# Patient Record
Sex: Female | Born: 1935 | Race: White | Hispanic: No | Smoking: Never smoker
Health system: Southern US, Community
[De-identification: ages and names within clinical notes are randomized; demographics above are authoritative.]

## PROBLEM LIST (undated history)

## (undated) DIAGNOSIS — M5441 Lumbago with sciatica, right side: Secondary | ICD-10-CM

## (undated) DIAGNOSIS — F329 Major depressive disorder, single episode, unspecified: Secondary | ICD-10-CM

## (undated) DIAGNOSIS — K219 Gastro-esophageal reflux disease without esophagitis: Secondary | ICD-10-CM

## (undated) DIAGNOSIS — N183 Chronic kidney disease, stage 3 unspecified: Secondary | ICD-10-CM

## (undated) DIAGNOSIS — G43909 Migraine, unspecified, not intractable, without status migrainosus: Secondary | ICD-10-CM

## (undated) DIAGNOSIS — M1990 Unspecified osteoarthritis, unspecified site: Secondary | ICD-10-CM

## (undated) DIAGNOSIS — I10 Essential (primary) hypertension: Secondary | ICD-10-CM

## (undated) DIAGNOSIS — F32A Depression, unspecified: Secondary | ICD-10-CM

## (undated) DIAGNOSIS — G4700 Insomnia, unspecified: Secondary | ICD-10-CM

## (undated) DIAGNOSIS — J690 Pneumonitis due to inhalation of food and vomit: Secondary | ICD-10-CM

## (undated) DIAGNOSIS — R1314 Dysphagia, pharyngoesophageal phase: Secondary | ICD-10-CM

## (undated) DIAGNOSIS — R1310 Dysphagia, unspecified: Secondary | ICD-10-CM

## (undated) DIAGNOSIS — N814 Uterovaginal prolapse, unspecified: Secondary | ICD-10-CM

## (undated) DIAGNOSIS — F419 Anxiety disorder, unspecified: Secondary | ICD-10-CM

## (undated) HISTORY — DX: Essential (primary) hypertension: I10

## (undated) HISTORY — DX: Anxiety disorder, unspecified: F41.9

## (undated) HISTORY — DX: Unspecified osteoarthritis, unspecified site: M19.90

## (undated) HISTORY — DX: Major depressive disorder, single episode, unspecified: F32.9

## (undated) HISTORY — DX: Uterovaginal prolapse, unspecified: N81.4

## (undated) HISTORY — DX: Depression, unspecified: F32.A

## (undated) HISTORY — DX: Gastro-esophageal reflux disease without esophagitis: K21.9

## (undated) HISTORY — DX: Insomnia, unspecified: G47.00

## (undated) HISTORY — PX: CATARACT EXTRACTION W/ INTRAOCULAR LENS IMPLANT: SHX1309

## (undated) HISTORY — DX: Lumbago with sciatica, right side: M54.41

## (undated) HISTORY — DX: Chronic kidney disease, stage 3 unspecified: N18.30

---
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
## 2009-09-28 DIAGNOSIS — L570 Actinic keratosis: Secondary | ICD-10-CM | POA: Insufficient documentation

## 2010-08-06 DIAGNOSIS — Z0000 Encounter for general adult medical examination without abnormal findings: Secondary | ICD-10-CM | POA: Insufficient documentation

## 2011-03-26 DIAGNOSIS — M79609 Pain in unspecified limb: Secondary | ICD-10-CM | POA: Diagnosis not present

## 2011-03-26 DIAGNOSIS — M542 Cervicalgia: Secondary | ICD-10-CM | POA: Diagnosis not present

## 2011-04-28 ENCOUNTER — Encounter: Payer: Self-pay | Admitting: Internal Medicine

## 2011-04-28 ENCOUNTER — Ambulatory Visit (INDEPENDENT_AMBULATORY_CARE_PROVIDER_SITE_OTHER): Payer: Medicare Other | Admitting: Internal Medicine

## 2011-04-28 VITALS — BP 132/90 | HR 92 | Temp 97.8°F | Resp 18 | Ht 60.75 in | Wt 120.0 lb

## 2011-04-28 DIAGNOSIS — F4000 Agoraphobia, unspecified: Secondary | ICD-10-CM

## 2011-04-28 DIAGNOSIS — I10 Essential (primary) hypertension: Secondary | ICD-10-CM | POA: Diagnosis not present

## 2011-04-28 DIAGNOSIS — M1990 Unspecified osteoarthritis, unspecified site: Secondary | ICD-10-CM | POA: Diagnosis not present

## 2011-04-28 DIAGNOSIS — Z0000 Encounter for general adult medical examination without abnormal findings: Secondary | ICD-10-CM | POA: Diagnosis not present

## 2011-04-28 MED ORDER — ATENOLOL 50 MG PO TABS: 75.0000 mg | ORAL_TABLET | Freq: Every day | ORAL | Status: DC

## 2011-04-28 MED ORDER — DIAZEPAM 5 MG PO TABS: 5.0000 mg | ORAL_TABLET | Freq: Three times a day (TID) | ORAL | Status: DC | PRN

## 2011-04-28 MED ORDER — AMITRIPTYLINE HCL 50 MG PO TABS: 50.0000 mg | ORAL_TABLET | Freq: Every day | ORAL | Status: DC

## 2011-04-28 NOTE — Progress Notes (Signed)
Subjective:    Patient ID: Savannah Diaz, female    DOB: 02-09-1933, 78 y.o.   MRN: [id number]  HPI  78 year old patient who is seen today to establish with our practice. She has a long history of both hypertension and what she describes as agoraphobia. She has been on blood pressure medications as well as amitriptyline for 30 years.    Is doing quite well today.  Gravida 2 para 2 abortus 0 Hospitalized briefly 30 years ago due to acute low back pain No other hospitalizations or surgeries  Social history she is a widowed about a year and a half lifelong nonsmoker daughter in the area resides in [place] 6 months of the year  Family history father died age 66 complications of colon cancer mother died at age 74 One brother in good health One sister died at age 79  1. Risk factors, based on past  M,S,F history  cardiovascular risk factors include a history of hypertension only  2.  Physical activities: No activity restrictions  3.  Depression/mood: No history of depression or mood disorder  4.  Hearing: No deficits  5.  ADL's: Independent in all aspects of daily living  6.  Fall risk: Low 7.  Home safety: No problems identified  8.  Height weight, and visual acuity; height and weight stable no change in visual acuity  9.  Counseling: Heart healthy diet more regular exercise encouraged  10. Lab orders based on risk factors: Not appropriate at this time  11. Referral : Not appropriate at this time  12. Care plan: We'll obtain annual health examination in [place];  will be available here for problems arise and also will see annually. She was asked to have all records faxed  13. Cognitive assessment: Alert and oriented with normal affect no cognitive dysfunction        Review of Systems  Constitutional: Negative for fever, appetite change, fatigue and unexpected weight change.  HENT: Negative for hearing loss, ear pain, nosebleeds, congestion, sore throat, mouth  sores, trouble swallowing, neck stiffness, dental problem, voice change, sinus pressure and tinnitus.   Eyes: Negative for photophobia, pain, redness and visual disturbance.  Respiratory: Negative for cough, chest tightness and shortness of breath.   Cardiovascular: Negative for chest pain, palpitations and leg swelling.  Gastrointestinal: Negative for nausea, vomiting, abdominal pain, diarrhea, constipation, blood in stool, abdominal distention and rectal pain.  Genitourinary: Negative for dysuria, urgency, frequency, hematuria, flank pain, vaginal bleeding, vaginal discharge, difficulty urinating, genital sores, vaginal pain, menstrual problem and pelvic pain.  Musculoskeletal: Negative for back pain and arthralgias.  Skin: Negative for rash.  Neurological: Negative for dizziness, syncope, speech difficulty, weakness, light-headedness, numbness and headaches.  Hematological: Negative for adenopathy. Does not bruise/bleed easily.  Psychiatric/Behavioral: Negative for suicidal ideas, behavioral problems, self-injury, dysphoric mood and agitation. The patient is not nervous/anxious.        Objective:   Physical Exam  Constitutional: She is oriented to person, place, and time. She appears well-developed and well-nourished.  HENT:  Head: Normocephalic and atraumatic.  Right Ear: External ear normal.  Left Ear: External ear normal.  Mouth/Throat: Oropharynx is clear and moist.  Eyes: Conjunctivae and EOM are normal.  Neck: Normal range of motion. Neck supple. No JVD present. No thyromegaly present.  Cardiovascular: Normal rate, regular rhythm, normal heart sounds and intact distal pulses.   No murmur heard. Pulmonary/Chest: Effort normal and breath sounds normal. She has no wheezes. She has no rales.  Abdominal:  Soft. Bowel sounds are normal. She exhibits no distension and no mass. There is no tenderness. There is no rebound and no guarding.  Musculoskeletal: Normal range of motion. She  exhibits no edema and no tenderness.       Trigger finger left third digit  Neurological: She is alert and oriented to person, place, and time. She has normal reflexes. No cranial nerve deficit. She exhibits normal muscle tone. Coordination normal.  Skin: Skin is warm and dry. No rash noted.  Psychiatric: She has a normal mood and affect. Her behavior is normal.          Assessment & Plan:  Hypertension well controlled Preventive health examination History of agoraphobia. Will decrease Elavil to 150 mg at bedtime. Hopefully can further titrate down We'll continue present blood pressure regimen  Return here in one year or as needed Followup in [place] also annually

## 2011-04-28 NOTE — Patient Instructions (Signed)
Limit your sodium (Salt) intake    It is important that you exercise regularly, at least 20 minutes 3 to 4 times per week.  If you develop chest pain or shortness of breath seek  medical attention.  Take a calcium supplement, plus 800-1200 units of vitamin D  Return in one year for follow-up  

## 2011-05-05 ENCOUNTER — Telehealth: Payer: Self-pay | Admitting: Family Medicine

## 2011-05-05 DIAGNOSIS — M6530 Trigger finger, unspecified finger: Secondary | ICD-10-CM

## 2011-05-05 NOTE — Telephone Encounter (Signed)
Please advise 

## 2011-05-05 NOTE — Telephone Encounter (Signed)
Refer Dr Sypher 

## 2011-05-05 NOTE — Telephone Encounter (Signed)
Hand surgeon - order done

## 2011-05-05 NOTE — Telephone Encounter (Signed)
Pt saw you recently as a new pt. Has a trigger finger, which at the time of the appt she did not wish to mess with. However, it is hurting more and she would like a referral to someone who can fix it. Thanks!

## 2011-05-05 NOTE — Telephone Encounter (Signed)
Refer Dr Teressa Senter

## 2011-05-13 ENCOUNTER — Encounter: Payer: Self-pay | Admitting: Internal Medicine

## 2011-05-13 ENCOUNTER — Ambulatory Visit (INDEPENDENT_AMBULATORY_CARE_PROVIDER_SITE_OTHER): Payer: Medicare Other | Admitting: Internal Medicine

## 2011-05-13 ENCOUNTER — Ambulatory Visit: Payer: Medicare Other | Admitting: Internal Medicine

## 2011-05-13 VITALS — BP 122/80 | Temp 98.7°F | Wt 116.0 lb

## 2011-05-13 DIAGNOSIS — I10 Essential (primary) hypertension: Secondary | ICD-10-CM | POA: Diagnosis not present

## 2011-05-13 DIAGNOSIS — S90129A Contusion of unspecified lesser toe(s) without damage to nail, initial encounter: Secondary | ICD-10-CM | POA: Diagnosis not present

## 2011-05-13 NOTE — Patient Instructions (Signed)
Continue application of ice until bedtime tonight Continue ibuprofen as needed  Elevation  Call if worsening pain redness or drainage

## 2011-05-13 NOTE — Progress Notes (Signed)
  Subjective:    Patient ID: Savannah Diaz, female    DOB: December 06, 1932, 78 y.o.   MRN: [id number]  HPI  78 year old patient who traumatized her left great toe earlier today there has been some minimal pain and bleeding. She has a history of hypertension which has been well-controlled. She has been using ibuprofen and applying ice    Review of Systems  Skin: Positive for wound.       Objective:   Physical Exam  Skin:       The left great toe had soft tissue swelling and ecchymoses about the nailbed. The area was cleaned antibiotic ointment applied and the wound dressed          Assessment & Plan:   Toenail contusion. Local wound care discussed she'll continue eyes for the next 24 hours then continue ibuprofen and elevation. We'll call if unimproved

## 2011-06-04 DIAGNOSIS — M6530 Trigger finger, unspecified finger: Secondary | ICD-10-CM | POA: Diagnosis not present

## 2011-07-22 DIAGNOSIS — M129 Arthropathy, unspecified: Secondary | ICD-10-CM | POA: Diagnosis not present

## 2011-07-22 DIAGNOSIS — F329 Major depressive disorder, single episode, unspecified: Secondary | ICD-10-CM | POA: Diagnosis not present

## 2011-07-22 DIAGNOSIS — I10 Essential (primary) hypertension: Secondary | ICD-10-CM | POA: Diagnosis not present

## 2011-07-28 DIAGNOSIS — M25561 Pain in right knee: Secondary | ICD-10-CM | POA: Insufficient documentation

## 2011-07-28 DIAGNOSIS — M25569 Pain in unspecified knee: Secondary | ICD-10-CM | POA: Diagnosis not present

## 2011-08-04 DIAGNOSIS — M25569 Pain in unspecified knee: Secondary | ICD-10-CM | POA: Diagnosis not present

## 2011-08-11 DIAGNOSIS — M25569 Pain in unspecified knee: Secondary | ICD-10-CM | POA: Diagnosis not present

## 2011-08-27 DIAGNOSIS — Z961 Presence of intraocular lens: Secondary | ICD-10-CM | POA: Diagnosis not present

## 2011-08-27 DIAGNOSIS — H269 Unspecified cataract: Secondary | ICD-10-CM | POA: Diagnosis not present

## 2011-08-27 DIAGNOSIS — F341 Dysthymic disorder: Secondary | ICD-10-CM | POA: Diagnosis not present

## 2011-09-10 DIAGNOSIS — N390 Urinary tract infection, site not specified: Secondary | ICD-10-CM | POA: Diagnosis not present

## 2011-09-10 DIAGNOSIS — R350 Frequency of micturition: Secondary | ICD-10-CM | POA: Diagnosis not present

## 2011-09-22 DIAGNOSIS — I10 Essential (primary) hypertension: Secondary | ICD-10-CM | POA: Diagnosis not present

## 2011-09-22 DIAGNOSIS — Z0000 Encounter for general adult medical examination without abnormal findings: Secondary | ICD-10-CM | POA: Diagnosis not present

## 2011-09-22 DIAGNOSIS — M810 Age-related osteoporosis without current pathological fracture: Secondary | ICD-10-CM | POA: Diagnosis not present

## 2011-09-24 DIAGNOSIS — F341 Dysthymic disorder: Secondary | ICD-10-CM | POA: Diagnosis not present

## 2011-10-21 DIAGNOSIS — L570 Actinic keratosis: Secondary | ICD-10-CM | POA: Diagnosis not present

## 2011-11-07 DIAGNOSIS — I998 Other disorder of circulatory system: Secondary | ICD-10-CM | POA: Diagnosis not present

## 2011-11-07 DIAGNOSIS — L988 Other specified disorders of the skin and subcutaneous tissue: Secondary | ICD-10-CM | POA: Diagnosis not present

## 2011-11-07 DIAGNOSIS — L570 Actinic keratosis: Secondary | ICD-10-CM | POA: Diagnosis not present

## 2011-11-07 DIAGNOSIS — K130 Diseases of lips: Secondary | ICD-10-CM | POA: Diagnosis not present

## 2011-12-01 DIAGNOSIS — L570 Actinic keratosis: Secondary | ICD-10-CM | POA: Diagnosis not present

## 2011-12-10 DIAGNOSIS — R300 Dysuria: Secondary | ICD-10-CM | POA: Diagnosis not present

## 2011-12-10 DIAGNOSIS — F341 Dysthymic disorder: Secondary | ICD-10-CM | POA: Diagnosis not present

## 2011-12-16 DIAGNOSIS — L570 Actinic keratosis: Secondary | ICD-10-CM | POA: Diagnosis not present

## 2012-04-27 ENCOUNTER — Ambulatory Visit (INDEPENDENT_AMBULATORY_CARE_PROVIDER_SITE_OTHER): Payer: Medicare Other | Admitting: Internal Medicine

## 2012-04-27 ENCOUNTER — Encounter: Payer: Self-pay | Admitting: Internal Medicine

## 2012-04-27 VITALS — BP 156/90 | HR 84 | Temp 98.3°F | Resp 18 | Wt 129.0 lb

## 2012-04-27 DIAGNOSIS — I10 Essential (primary) hypertension: Secondary | ICD-10-CM

## 2012-04-27 DIAGNOSIS — M1990 Unspecified osteoarthritis, unspecified site: Secondary | ICD-10-CM | POA: Diagnosis not present

## 2012-04-27 NOTE — Patient Instructions (Signed)
Limit your sodium (Salt) intake    It is important that you exercise regularly, at least 20 minutes 3 to 4 times per week.  If you develop chest pain or shortness of breath seek  medical attention.  Please check your blood pressure on a regular basis.  If it is consistently greater than 150/90, please make an office appointment.  

## 2012-04-27 NOTE — Progress Notes (Signed)
Subjective:    Patient ID: Savannah Diaz, female    DOB: 1933/01/25, 79 y.o.   MRN: [id number]  HPI  79 year old patient who is seen today for followup. She has a history of treated hypertension. She is followed in [place] both by a PCP and psychiatrist. She is doing quite well today. She remains on amitriptyline presently a dose of 225 mg. She is doing recently well and is planning on returning to [place] later this month for her annual exam there as well as psychiatric followup  Past Medical History  Diagnosis Date  . Hypertension   . Anxiety   . Arthritis   . Cataract     left  . Depression   . GERD (gastroesophageal reflux disease)   . Osteoporosis     History   Social History  . Marital Status: Widowed    Spouse Name: N/A    Number of Children: N/A  . Years of Education: N/A   Occupational History  . Not on file.   Social History Main Topics  . Smoking status: Never Smoker   . Smokeless tobacco: Never Used  . Alcohol Use: No  . Drug Use: No  . Sexually Active: Not on file   Other Topics Concern  . Not on file   Social History Narrative  . No narrative on file    Past Surgical History  Procedure Laterality Date  . Cataract extraction      left    Family History  Problem Relation Age of Onset  . Arthritis Mother   . Hypertension Mother   . Dementia Mother   . Cancer Father     colon     No Known Allergies  Current Outpatient Prescriptions on File Prior to Visit  Medication Sig Dispense Refill  . amitriptyline (ELAVIL) 50 MG tablet Take 225 mg by mouth at bedtime.       Marland Kitchen atenolol (TENORMIN) 50 MG tablet Take 1.5 tablets (75 mg total) by mouth daily.  180 tablet  4  . diazepam (VALIUM) 5 MG tablet Take 1 tablet (5 mg total) by mouth every 8 (eight) hours as needed.  60 tablet  4  . famotidine (PEPCID) 20 MG tablet Take 40 mg by mouth 2 (two) times daily.      Marland Kitchen lisinopril (PRINIVIL,ZESTRIL) 40 MG tablet Take 40 mg by mouth daily.        No current facility-administered medications on file prior to visit.    BP 156/90  Pulse 84  Temp(Src) 98.3 F (36.8 C) (Oral)  Resp 18  Wt 129 lb (58.514 kg)  BMI 24.58 kg/m2  SpO2 96%       Review of Systems  Constitutional: Negative.   HENT: Negative for hearing loss, congestion, sore throat, rhinorrhea, dental problem, sinus pressure and tinnitus.   Eyes: Negative for pain, discharge and visual disturbance.  Respiratory: Negative for cough and shortness of breath.   Cardiovascular: Negative for chest pain, palpitations and leg swelling.  Gastrointestinal: Negative for nausea, vomiting, abdominal pain, diarrhea, constipation, blood in stool and abdominal distention.  Genitourinary: Negative for dysuria, urgency, frequency, hematuria, flank pain, vaginal bleeding, vaginal discharge, difficulty urinating, vaginal pain and pelvic pain.  Musculoskeletal: Negative for joint swelling, arthralgias and gait problem.  Skin: Negative for rash.  Neurological: Negative for dizziness, syncope, speech difficulty, weakness, numbness and headaches.  Hematological: Negative for adenopathy.  Psychiatric/Behavioral: Positive for dysphoric mood. Negative for behavioral problems and agitation. The patient is nervous/anxious.  Objective:   Physical Exam  Constitutional: She is oriented to person, place, and time. She appears well-developed and well-nourished.  HENT:  Head: Normocephalic.  Right Ear: External ear normal.  Left Ear: External ear normal.  Mouth/Throat: Oropharynx is clear and moist.  Eyes: Conjunctivae and EOM are normal. Pupils are equal, round, and reactive to light.  Neck: Normal range of motion. Neck supple. No thyromegaly present.  Cardiovascular: Normal rate, regular rhythm, normal heart sounds and intact distal pulses.   Pulmonary/Chest: Effort normal and breath sounds normal.  Abdominal: Soft. Bowel sounds are normal. She exhibits no mass. There is no  tenderness.  Musculoskeletal: Normal range of motion.  Lymphadenopathy:    She has no cervical adenopathy.  Neurological: She is alert and oriented to person, place, and time.  Skin: Skin is warm and dry. No rash noted.  Psychiatric: She has a normal mood and affect. Her behavior is normal.          Assessment & Plan:   Adjustment reaction with anxiety depression, death of her husband. Followup psychiatry Hypertension fairly stable repeat blood pressure 140/84. CPX with [facility] PCP as scheduled  Return here in one year or as needed

## 2012-05-10 ENCOUNTER — Encounter (HOSPITAL_COMMUNITY)
Admission: RE | Disposition: A | Discharge status: Home / Self Care | Payer: Self-pay | Source: Ambulatory Visit | Attending: Internal Medicine

## 2012-05-10 ENCOUNTER — Encounter: Payer: Self-pay | Admitting: Physician Assistant

## 2012-05-10 ENCOUNTER — Encounter: Payer: Self-pay | Admitting: Family Medicine

## 2012-05-10 ENCOUNTER — Other Ambulatory Visit: Payer: Self-pay | Admitting: *Deleted

## 2012-05-10 ENCOUNTER — Encounter (HOSPITAL_COMMUNITY): Payer: Self-pay | Admitting: *Deleted

## 2012-05-10 ENCOUNTER — Ambulatory Visit (HOSPITAL_COMMUNITY)
Admission: RE | Admit: 2012-05-10 | Discharge: 2012-05-10 | Disposition: A | Discharge status: Home / Self Care | Payer: Medicare Other | Source: Ambulatory Visit | Attending: Internal Medicine | Admitting: Internal Medicine

## 2012-05-10 ENCOUNTER — Ambulatory Visit (INDEPENDENT_AMBULATORY_CARE_PROVIDER_SITE_OTHER): Payer: Medicare Other | Admitting: Family Medicine

## 2012-05-10 ENCOUNTER — Ambulatory Visit (INDEPENDENT_AMBULATORY_CARE_PROVIDER_SITE_OTHER): Payer: Medicare Other | Admitting: Physician Assistant

## 2012-05-10 VITALS — HR 70 | Temp 99.7°F | Wt 124.0 lb

## 2012-05-10 VITALS — BP 150/90 | HR 118 | Ht 66.0 in | Wt 123.0 lb

## 2012-05-10 DIAGNOSIS — R05 Cough: Secondary | ICD-10-CM

## 2012-05-10 DIAGNOSIS — I10 Essential (primary) hypertension: Secondary | ICD-10-CM | POA: Diagnosis not present

## 2012-05-10 DIAGNOSIS — R6889 Other general symptoms and signs: Secondary | ICD-10-CM | POA: Insufficient documentation

## 2012-05-10 DIAGNOSIS — F4000 Agoraphobia, unspecified: Secondary | ICD-10-CM | POA: Diagnosis not present

## 2012-05-10 DIAGNOSIS — Z79899 Other long term (current) drug therapy: Secondary | ICD-10-CM | POA: Diagnosis not present

## 2012-05-10 DIAGNOSIS — R1310 Dysphagia, unspecified: Secondary | ICD-10-CM

## 2012-05-10 DIAGNOSIS — K449 Diaphragmatic hernia without obstruction or gangrene: Secondary | ICD-10-CM | POA: Insufficient documentation

## 2012-05-10 DIAGNOSIS — R1314 Dysphagia, pharyngoesophageal phase: Secondary | ICD-10-CM

## 2012-05-10 DIAGNOSIS — Z01818 Encounter for other preprocedural examination: Secondary | ICD-10-CM

## 2012-05-10 DIAGNOSIS — R059 Cough, unspecified: Secondary | ICD-10-CM | POA: Insufficient documentation

## 2012-05-10 HISTORY — PX: ESOPHAGOGASTRODUODENOSCOPY: SHX5428

## 2012-05-10 HISTORY — DX: Dysphagia, pharyngoesophageal phase: R13.14

## 2012-05-10 SURGERY — EGD (ESOPHAGOGASTRODUODENOSCOPY)
Anesthesia: Moderate Sedation

## 2012-05-10 MED ORDER — MIDAZOLAM HCL 10 MG/2ML IJ SOLN
INTRAMUSCULAR | Status: DC | PRN
  Administered 2012-05-10 (×2): 2 mg via INTRAVENOUS
  Administered 2012-05-10 (×2): 1 mg via INTRAVENOUS
  Administered 2012-05-10: 2 mg via INTRAVENOUS

## 2012-05-10 MED ORDER — DIPHENHYDRAMINE HCL 50 MG/ML IJ SOLN
INTRAMUSCULAR | Status: AC
  Filled 2012-05-10: qty 1

## 2012-05-10 MED ORDER — MIDAZOLAM HCL 10 MG/2ML IJ SOLN
INTRAMUSCULAR | Status: AC
  Filled 2012-05-10: qty 2

## 2012-05-10 MED ORDER — FENTANYL CITRATE 0.05 MG/ML IJ SOLN
INTRAMUSCULAR | Status: DC | PRN
  Administered 2012-05-10 (×4): 25 ug via INTRAVENOUS

## 2012-05-10 MED ORDER — FENTANYL CITRATE 0.05 MG/ML IJ SOLN
INTRAMUSCULAR | Status: AC
  Filled 2012-05-10: qty 2

## 2012-05-10 MED ORDER — SODIUM CHLORIDE 0.9 % IV SOLN
INTRAVENOUS | Status: DC
  Administered 2012-05-10: 500 mL via INTRAVENOUS

## 2012-05-10 NOTE — Interval H&P Note (Signed)
History and Physical Interval Note: As per note from clinic today. The nature of the procedure, as well as the risks, benefits, and alternatives were carefully and thoroughly reviewed with the patient. Ample time for discussion and questions allowed. The patient understood, was satisfied, and agreed to proceed.      05/10/2012 3:05 PM  Savannah Diaz  has presented today for surgery, with the diagnosis of pill impaction  The various methods of treatment have been discussed with the patient and family. After consideration of risks, benefits and other options for treatment, the patient has consented to  Procedure(s): ESOPHAGOGASTRODUODENOSCOPY (EGD) (N/A) as a surgical intervention .  The patient's history has been reviewed, patient examined, no change in status, stable for surgery.  I have reviewed the patient's chart and labs.  Questions were answered to the patient's satisfaction.     Navie Lamoreaux M

## 2012-05-10 NOTE — Patient Instructions (Addendum)
You have been scheduled for an endoscopy with propofol. Please follow written instructions given to you at your visit today. If you use inhalers (even only as needed), please bring them with you on the day of your procedure.  Go to [facility] End[facility] at 1 PM.

## 2012-05-10 NOTE — H&P (View-Only) (Signed)
Subjective:    Patient ID: Savannah Diaz, female    DOB: 03/26/1932, 79 y.o.   MRN: [id number]  HPI Savannah Diaz is a 79-year-old white female new to GI today referred by Dr. Hanna Kim after being seen earlier this morning at her office. Patient has developed an acute episode of dysphagia which had onset last evening after she tried to swallow her medications. She says she did her without any difficulty but after she cut up her pills to swallow them which she normally does she feels as that lead as if at least one of the pills got stuck. She says she started having coughing and then started producing a lot of phlegm and has been unable to swallow since. She says she was up all night coughing and uncomfortable but has never brought back up the pills ,and. has not vomited. She has been some spitting but says at this time she's able to swallow her saliva . Her daughter says she was barely able to speak this morning but over the past couple of hours her voice quality has improved. While being evaluated in the office she does appear to be swallowing her saliva and her voice sounds very wet and she's intermittently coughing. He says she has had some difficulty swallowing over the past 7 years but has not had any evaluation. She says she chops her food up into very small pieces and eats very slowly and has not had any episodes of regurgitation. She feels that her throat is "tight" , doesn't usually get a sensation of food sitting in her chest. She denies any regular heartburn or indigestion.    Review of Systems  Constitutional: Negative.   HENT: Positive for trouble swallowing and voice change.   Eyes: Negative.   Respiratory: Positive for cough and choking.   Cardiovascular: Negative.   Gastrointestinal: Negative.   Endocrine: Negative.   Genitourinary: Negative.   Musculoskeletal: Negative.   Skin: Negative.   Allergic/Immunologic: Negative.   Neurological: Negative.   Hematological: Negative.    Psychiatric/Behavioral: Negative.    Outpatient Prescriptions Prior to Visit  Medication Sig Dispense Refill  . amitriptyline (ELAVIL) 50 MG tablet Take 225 mg by mouth at bedtime.       . atenolol (TENORMIN) 50 MG tablet Take 1.5 tablets (75 mg total) by mouth daily.  180 tablet  4  . diazepam (VALIUM) 5 MG tablet Take 1 tablet (5 mg total) by mouth every 8 (eight) hours as needed.  60 tablet  4  . famotidine (PEPCID) 20 MG tablet Take 40 mg by mouth 2 (two) times daily.      . lisinopril (PRINIVIL,ZESTRIL) 40 MG tablet Take 40 mg by mouth daily.       No facility-administered medications prior to visit.   No Known Allergies Patient Active Problem List  Diagnosis  . Hypertension  . Osteoarthritis  . Agoraphobia   History  Substance Use Topics  . Smoking status: Never Smoker   . Smokeless tobacco: Never Used  . Alcohol Use: No   family history includes Arthritis in her mother; Cancer in her father; Dementia in her mother; and Hypertension in her mother.     Objective:   Physical Exam  Well-developed elderly white female in any by her daughter. Patient is coughing and voice quality is very wet.. Blood pressure 150/90 pulse 110 height 5 foot 6 weight 123 O2 sat 98. HEENT nontraumatic normocephalic EOMI PERRLA sclera anicteric,Neck; Supple no JVD, Cardiovascular; regular rate and rhythm with S1-S2   no murmur or rub or gallop, Pulmonary; clear she has a few upper airway rhonchi, Abdomen; soft nontender nondistended bowel sounds are active no palpable mass or hepatosplenomegaly extremities no clubbing cyanosis or edema, skin warm and dry. Psych; mood and affect normal and appropriate       Assessment & Plan:  #1 79-year-old female with a several year history of solid food dysphagia now presenting with an episode of acute dysphagia with probable pill impaction. She is able to swallow her saliva but with sipping water feels as if she is going to choke and cough. I suspect she may still  have a pill fragment stuck. Will need to rule out underlying esophageal stricture, Zenker's diverticulum, or motility disorder. #2 HTN #3 Agoraphobia  Plan; patient will remain n.p.o. and have scheduled for upper endoscopy with Dr. Pyrtle this afternoon at [place] period Procedure was discussed in detail with patient and her daughter and they are agreeable to proceed. They asked whether her esophagus will be dilated if a stricture is found and I indicated that she may need a second procedure for dilation as generally the esophagus is too inflamed after an episode like she has just had to make dilation safe on the same day Patient's daughter was somewhat reluctant for her to undergo procedure today as she felt that her voice was sounding a bit better. I advised they proceed with the endoscopy.  Addendum: Reviewed and agree with initial management. Jay M Pyrtle, MD   

## 2012-05-10 NOTE — Progress Notes (Signed)
Chief Complaint  Patient presents with  . Cough    congestion, mucus, sensitive throat    HPI:  Acute visit for cough: -started: yesterday -symptoms: has had trouble swallowing pills and solids - has for a long time (7 years), has not seen anyone for this - since last night when swallowing pills has had odd sounding voice, feels like something is stuck in throat, foamy mucus and dysphagia to liquids and solids - chokes on anything she tries to swallow -denies:fever, SOB, NVD, tooth pain, strep or mono exposure - can't swallow since yesterday - makes her cough -sick contacts: none -Hx of: swallowing difficulty -denies: denies: fevers, SOB, vomiting, reflux, abd pain, pain in throat -hx of GERD on PMH   ROS: See pertinent positives and negatives per HPI.  Past Medical History  Diagnosis Date  . Hypertension   . Anxiety   . Arthritis   . Cataract     left  . Depression   . GERD (gastroesophageal reflux disease)   . Osteoporosis     Family History  Problem Relation Age of Onset  . Arthritis Mother   . Hypertension Mother   . Dementia Mother   . Cancer Father     colon     History   Social History  . Marital Status: Widowed    Spouse Name: N/A    Number of Children: N/A  . Years of Education: N/A   Social History Main Topics  . Smoking status: Never Smoker   . Smokeless tobacco: Never Used  . Alcohol Use: No  . Drug Use: No  . Sexually Active: None   Other Topics Concern  . None   Social History Narrative  . None    Current outpatient prescriptions:amitriptyline (ELAVIL) 50 MG tablet, Take 225 mg by mouth at bedtime. , Disp: , Rfl: ;  atenolol (TENORMIN) 50 MG tablet, Take 1.5 tablets (75 mg total) by mouth daily., Disp: 180 tablet, Rfl: 4;  diazepam (VALIUM) 5 MG tablet, Take 1 tablet (5 mg total) by mouth every 8 (eight) hours as needed., Disp: 60 tablet, Rfl: 4;  famotidine (PEPCID) 20 MG tablet, Take 40 mg by mouth 2 (two) times daily., Disp: , Rfl:   lisinopril (PRINIVIL,ZESTRIL) 40 MG tablet, Take 40 mg by mouth daily., Disp: , Rfl:   EXAM:  Filed Vitals:   05/10/12 0913  Pulse: 70  Temp: 99.7 F (37.6 C)    Body mass index is 23.62 kg/(m^2).  GENERAL: vitals reviewed and listed above, alert, oriented, appears well hydrated and in no acute distress  HEENT: atraumatic, conjunttiva clear, no obvious abnormalities on inspection of external nose and ears  NECK: no obvious masses on inspection  LUNGS: clear to auscultation bilaterally, no wheezes, rales or rhonchi, good air movement - upper airway rhonchi  CV: HRRR, no peripheral edema  MS: moves all extremities without noticeable abnormality  PSYCH: pleasant and cooperative, no obvious depression or anxiety  ASSESSMENT AND PLAN:  Discussed the following assessment and plan:  Dysphagia, unspecified - Plan: Ambulatory referral to Gastroenterology  -arranged for her to see GI this morning for eval and treat - emergency precautions discussed -Patient advised to return or notify a doctor immediately if symptoms worsen or persist or new concerns arise.  There are no Patient Instructions on file for this visit.   Kriste Basque R.

## 2012-05-10 NOTE — Patient Instructions (Signed)
Go to new patient appointment with GI today per details

## 2012-05-10 NOTE — Progress Notes (Addendum)
Subjective:    Patient ID: Savannah Diaz, female    DOB: 1932-09-06, 79 y.o.   MRN: [id number]  HPI Savannah Diaz is a 79 year old white female new to GI today referred by Dr. Herold Diaz after being seen earlier this morning at her office. Patient has developed an acute episode of dysphagia which had onset last evening after she tried to swallow her medications. She says she did her without any difficulty but after she cut up her pills to swallow them which she normally does she feels as that lead as if at least one of the pills got stuck. She says she started having coughing and then started producing a lot of phlegm and has been unable to swallow since. She says she was up all night coughing and uncomfortable but has never brought back up the pills ,and. has not vomited. She has been some spitting but says at this time she's able to swallow her saliva . Her daughter says she was barely able to speak this morning but over the past couple of hours her voice quality has improved. While being evaluated in the office she does appear to be swallowing her saliva and her voice sounds very wet and she's intermittently coughing. He says she has had some difficulty swallowing over the past 7 years but has not had any evaluation. She says she chops her food up into very small pieces and eats very slowly and has not had any episodes of regurgitation. She feels that her throat is "tight" , doesn't usually get a sensation of food sitting in her chest. She denies any regular heartburn or indigestion.    Review of Systems  Constitutional: Negative.   HENT: Positive for trouble swallowing and voice change.   Eyes: Negative.   Respiratory: Positive for cough and choking.   Cardiovascular: Negative.   Gastrointestinal: Negative.   Endocrine: Negative.   Genitourinary: Negative.   Musculoskeletal: Negative.   Skin: Negative.   Allergic/Immunologic: Negative.   Neurological: Negative.   Hematological: Negative.    Psychiatric/Behavioral: Negative.    Outpatient Prescriptions Prior to Visit  Medication Sig Dispense Refill  . amitriptyline (ELAVIL) 50 MG tablet Take 225 mg by mouth at bedtime.       Marland Kitchen atenolol (TENORMIN) 50 MG tablet Take 1.5 tablets (75 mg total) by mouth daily.  180 tablet  4  . diazepam (VALIUM) 5 MG tablet Take 1 tablet (5 mg total) by mouth every 8 (eight) hours as needed.  60 tablet  4  . famotidine (PEPCID) 20 MG tablet Take 40 mg by mouth 2 (two) times daily.      Marland Kitchen lisinopril (PRINIVIL,ZESTRIL) 40 MG tablet Take 40 mg by mouth daily.       No facility-administered medications prior to visit.   No Known Allergies Patient Active Problem List  Diagnosis  . Hypertension  . Osteoarthritis  . Agoraphobia   History  Substance Use Topics  . Smoking status: Never Smoker   . Smokeless tobacco: Never Used  . Alcohol Use: No   family history includes Arthritis in her mother; Cancer in her father; Dementia in her mother; and Hypertension in her mother.     Objective:   Physical Exam  Well-developed elderly white female in any by her daughter. Patient is coughing and voice quality is very wet.. Blood pressure 150/90 pulse 110 height 5 foot 6 weight 123 O2 sat 98. HEENT nontraumatic normocephalic EOMI PERRLA sclera anicteric,Neck; Supple no JVD, Cardiovascular; regular rate and rhythm with S1-S2  no murmur or rub or gallop, Pulmonary; clear she has a few upper airway rhonchi, Abdomen; soft nontender nondistended bowel sounds are active no palpable mass or hepatosplenomegaly extremities no clubbing cyanosis or edema, skin warm and dry. Psych; mood and affect normal and appropriate       Assessment & Plan:  #8 79 year old female with a several year history of solid food dysphagia now presenting with an episode of acute dysphagia with probable pill impaction. She is able to swallow her saliva but with sipping water feels as if she is going to choke and cough. I suspect she may still  have a pill fragment stuck. Will need to rule out underlying esophageal stricture, Zenker's diverticulum, or motility disorder. #2 HTN #3 Agoraphobia  Plan; patient will remain n.p.o. and have scheduled for upper endoscopy with Dr. Rhea Diaz this afternoon at [facility] Procedure was discussed in detail with patient and her daughter and they are agreeable to proceed. They asked whether her esophagus will be dilated if a stricture is found and I indicated that she may need a second procedure for dilation as generally the esophagus is too inflamed after an episode like she has just had to make dilation safe on the same day Patient's daughter was somewhat reluctant for her to undergo procedure today as she felt that her voice was sounding a bit better. I advised they proceed with the endoscopy.  Addendum: Reviewed and agree with initial management. Savannah Fiedler, MD

## 2012-05-10 NOTE — Op Note (Signed)
[facility] [address] [place], [address]   ENDOSCOPY PROCEDURE REPORT  PATIENT: Savannah Diaz, Savannah Diaz  MR#: [id number] BIRTHDATE: January 04, 1933 , 79  yrs. old GENDER: Female ENDOSCOPIST: Beverley Fiedler, MD REFERRED BY:  Monica Becton, Amy PROCEDURE DATE:  05/10/2012 PROCEDURE:  EGD, diagnostic ASA CLASS:     Class II INDICATIONS:  Dysphagia. MEDICATIONS: These medications were titrated to patient response per physician's verbal order, Versed 8 mg IV, and Fentanyl 100 mcg IV  TOPICAL ANESTHETIC: none  DESCRIPTION OF PROCEDURE: After the risks benefits and alternatives of the procedure were thoroughly explained, informed consent was obtained.  The Pentax Gastroscope [id number] endoscope was introduced through the mouth and advanced to the second portion of the duodenum (intubation of the esophagus required switching to the pediatric upper endoscope due to mild resistance with the adult upper endoscope) .  The instrument was slowly withdrawn as the mucosa was fully examined.     Copious frothy sputum was encountered in the posterior oropharynx and laryngeal space requiring suction.  ESOPHAGUS: Abnormal mucosa was found in the upper third of the esophagus (query pill-induced). Sloughing mucosa with whitish discoloration (not consistent with typical candidiasis) in the proximal third. The middle and distal third of the esophagus appeared grossly normal, query mild diffuse dilation.  The GE junction was grossly unremarkable.  STOMACH: A small hiatal hernia was noted.   The mucosa of the stomach appeared normal.  DUODENUM: The duodenal mucosa showed no abnormalities in the bulb and second portion of the duodenum.  Retroflexed views revealed a hiatal hernia.     The scope was then withdrawn from the patient and the procedure completed.  COMPLICATIONS: There were no complications.  ENDOSCOPIC IMPRESSION: 1.   Abnormal mucosa was found in the upper third of the  esophagus; query pill-induced injury 2.   Small hiatal hernia 3.   The mucosa of the stomach appeared normal 4.   The duodenal mucosa showed no abnormalities in the bulb and second portion of the duodenum  RECOMMENDATIONS: 1.  My office will arrange for you to have a Barium Esophagram performed.  This is a radiology test to examine your esophagus and to better image the upper esophagus and evaluate motility. 2.  Very soft diet for now 3.  Possible esophageal manometry depending on results of barium esophagram   eSigned:  Beverley Fiedler, MD 05/10/2012 3:48 PM   CC:The Patient  and Dr. Kriste Basque, DO  PATIENT NAME:  Savannah Diaz, Savannah Diaz MR#: [id number]

## 2012-05-11 ENCOUNTER — Telehealth: Payer: Self-pay | Admitting: Internal Medicine

## 2012-05-11 ENCOUNTER — Telehealth: Payer: Self-pay | Admitting: *Deleted

## 2012-05-11 ENCOUNTER — Encounter (HOSPITAL_COMMUNITY): Payer: Self-pay | Admitting: Internal Medicine

## 2012-05-11 DIAGNOSIS — R1310 Dysphagia, unspecified: Secondary | ICD-10-CM

## 2012-05-11 NOTE — Telephone Encounter (Signed)
Needs to see PCP if needs something for anxiety. These meds can cause confusion and I prescribe them very rarely in elderly patients. we only discussed her swallowing at the appt.

## 2012-05-11 NOTE — Telephone Encounter (Signed)
Spoke with pt's daughter Herbert Seta sveral times. She reports pt is very nervous and a friend suggested pt take Valium to relax. Heather reports pt does seem to feel/act better after the Valium. She reports pt ate eggs and drank this am w/o any problems but as the morning went on, she pooled her secretions again and won't spit. Herbert Seta is hesitant for pt to have the BS if she can't swallow her own spit. She states she doesn't want her mom to have tests just to find out a dx and then nothing can be done. Spoke with Dr Rhea Belton and informed Herbert Seta that pt was very hard to intubate and he wants to r/o a Zenker's Diverticulum; pt would have to see an ENT for that. Then, she wanted something to relax her mom, like a muscle relaxer. Spoke with Mike Gip, PA who stated that would not be wise if pt is aspirating; we are doing a 2 View of the Chest to rule this out. Moved the testing up and daughter agreed to have her mom at [facility] in the am at 08:30am. NPO, but she may take a whole Valium with a little water. If pt will not swallow her own saliva in the am, Herbert Seta will call me.

## 2012-05-11 NOTE — Telephone Encounter (Signed)
Called pt's daughter and she is in the middle of class; request call back on 3/19

## 2012-05-11 NOTE — Telephone Encounter (Signed)
Patient Information:  Caller Name: Herbert Seta  Phone: [phone]  Patient: Savannah Diaz, Savannah Diaz  Gender: Female  DOB: 12-05-1932  Age: 79 Years  PCP: Eleonore Chiquito [facility])  Office Follow Up:  Does the office need to follow up with this patient?: Yes  Instructions For The Office: would like recommendations on what to do ?  (#1) small opening per GI  (#2) Anxious and nervous  (#3  Could this be TIA related.  Contact daughter [phone]  RN Note:  Contacted the office and spoke with [place]. She is going to speak with Dr.Kim .  They will contact patient.  Contact Herbert Seta- [phone]  Symptoms  Reason For Call & Symptoms: Daughter Herbert Seta calling in regards to her mother-Savannah Diaz.  She was seen at [facility] yesterday 05/10/12 for a "pill possibly being stuck in her throat".  She was sent to ENT [place] .  ENT referred her for an Endoscopy yesterday. She was sedated and had her throat evaluated.  "grossly normal" but the opening to throat was "small" . After endoscopy she felt better but symptoms returned again a few hours later.  She feels like she cannot swallow and spits out her saliva. She cannot drink liquids and eat food.  They gave her a valium last night which helped.  Today, she is experiencing symptoms and they just gave her another valium. She sounds like she is in a pool of small bubbles talking.  (1) her mother is very nervous person/anxious. (2) Her mother does have slight memory issues and lives alone.- if she were to have dizzy spell no one would know.  Daughter is wondering if this is a TIA related?   What does Dr. Kirtland Bouchard recommend?  Reviewed Health History In EMR: Yes  Reviewed Medications In EMR: Yes  Reviewed Allergies In EMR: Yes  Reviewed Surgeries / Procedures: Yes  Date of Onset of Symptoms: 05/10/2012  Treatments Tried: Endoscopy, Valium  Treatments Tried Worked: No  Guideline(s) Used:  Sore Throat  Disposition Per Guideline:   Go to ED Now (or to Office with PCP  Approval)  Reason For Disposition Reached:   Drooling or spitting out saliva (because can't swallow)  Advice Given:  Soft Diet:   Cold drinks and milk shakes are especially good (Reason: swollen tonsils can make some foods hard to swallow).  Liquids:  Adequate liquid intake is important to prevent dehydration. Drink 6-8 glasses of water per day.  Patient Will Follow Care Advice:  YES

## 2012-05-11 NOTE — Telephone Encounter (Signed)
Per Dr. Selena Batten pt should follow up with GI.  If pt feels she is having symptoms of a stroke pt should go to ED. Spoke with pt's daughter Herbert Seta and she is aware.  Pt's daughter the TIA symptoms happened Sunday when pt had to get up early to get on a plane and pt was a little disoriented. Pt's daughter states she just thought about this and wanted to call the office.  Pt's daughter aware that if pt is still having trouble swallowing GI should be contacted. If pt feels she is possibly having a TIA pt should go to ED. Pt daughter states she understands. Pt has an upcoming swallow test on Friday 3/21.   Per pt's daughter states pt may be experiencing a nervous reaction to not being able to swallow.  Pt has some Valium she takes prn but it is difficult for pt to swallow. Pt needs something to help her relax  Gi will not call this in.

## 2012-05-11 NOTE — Telephone Encounter (Signed)
Pls advise.  

## 2012-05-12 ENCOUNTER — Encounter (HOSPITAL_COMMUNITY): Payer: Self-pay | Admitting: *Deleted

## 2012-05-12 ENCOUNTER — Observation Stay (HOSPITAL_COMMUNITY)
Admission: EM | Admit: 2012-05-12 | Discharge: 2012-05-15 | Disposition: A | Discharge status: Home / Self Care | Payer: Medicare Other | Attending: Internal Medicine | Admitting: Internal Medicine

## 2012-05-12 ENCOUNTER — Ambulatory Visit (HOSPITAL_COMMUNITY)
Admission: RE | Admit: 2012-05-12 | Discharge: 2012-05-12 | Disposition: A | Discharge status: Home / Self Care | Payer: Medicare Other | Source: Ambulatory Visit | Attending: Internal Medicine | Admitting: Internal Medicine

## 2012-05-12 ENCOUNTER — Telehealth: Payer: Self-pay | Admitting: Internal Medicine

## 2012-05-12 ENCOUNTER — Other Ambulatory Visit: Payer: Self-pay

## 2012-05-12 ENCOUNTER — Other Ambulatory Visit: Payer: Self-pay | Admitting: *Deleted

## 2012-05-12 ENCOUNTER — Telehealth: Payer: Self-pay | Admitting: *Deleted

## 2012-05-12 DIAGNOSIS — R9389 Abnormal findings on diagnostic imaging of other specified body structures: Secondary | ICD-10-CM | POA: Insufficient documentation

## 2012-05-12 DIAGNOSIS — R1310 Dysphagia, unspecified: Secondary | ICD-10-CM

## 2012-05-12 DIAGNOSIS — K219 Gastro-esophageal reflux disease without esophagitis: Secondary | ICD-10-CM | POA: Insufficient documentation

## 2012-05-12 DIAGNOSIS — R000 Tachycardia, unspecified: Secondary | ICD-10-CM | POA: Insufficient documentation

## 2012-05-12 DIAGNOSIS — S22009A Unspecified fracture of unspecified thoracic vertebra, initial encounter for closed fracture: Secondary | ICD-10-CM | POA: Insufficient documentation

## 2012-05-12 DIAGNOSIS — F411 Generalized anxiety disorder: Secondary | ICD-10-CM | POA: Diagnosis not present

## 2012-05-12 DIAGNOSIS — E876 Hypokalemia: Secondary | ICD-10-CM | POA: Diagnosis present

## 2012-05-12 DIAGNOSIS — I6789 Other cerebrovascular disease: Secondary | ICD-10-CM | POA: Insufficient documentation

## 2012-05-12 DIAGNOSIS — R1314 Dysphagia, pharyngoesophageal phase: Principal | ICD-10-CM | POA: Insufficient documentation

## 2012-05-12 DIAGNOSIS — E870 Hyperosmolality and hypernatremia: Secondary | ICD-10-CM | POA: Diagnosis not present

## 2012-05-12 DIAGNOSIS — J449 Chronic obstructive pulmonary disease, unspecified: Secondary | ICD-10-CM | POA: Insufficient documentation

## 2012-05-12 DIAGNOSIS — E860 Dehydration: Secondary | ICD-10-CM | POA: Diagnosis present

## 2012-05-12 DIAGNOSIS — I10 Essential (primary) hypertension: Secondary | ICD-10-CM | POA: Diagnosis not present

## 2012-05-12 DIAGNOSIS — F4000 Agoraphobia, unspecified: Secondary | ICD-10-CM

## 2012-05-12 DIAGNOSIS — M1990 Unspecified osteoarthritis, unspecified site: Secondary | ICD-10-CM

## 2012-05-12 DIAGNOSIS — X58XXXA Exposure to other specified factors, initial encounter: Secondary | ICD-10-CM | POA: Insufficient documentation

## 2012-05-12 DIAGNOSIS — M129 Arthropathy, unspecified: Secondary | ICD-10-CM | POA: Insufficient documentation

## 2012-05-12 DIAGNOSIS — J4489 Other specified chronic obstructive pulmonary disease: Secondary | ICD-10-CM | POA: Insufficient documentation

## 2012-05-12 DIAGNOSIS — J9819 Other pulmonary collapse: Secondary | ICD-10-CM | POA: Insufficient documentation

## 2012-05-12 DIAGNOSIS — R1319 Other dysphagia: Secondary | ICD-10-CM

## 2012-05-12 HISTORY — DX: Migraine, unspecified, not intractable, without status migrainosus: G43.909

## 2012-05-12 HISTORY — DX: Dysphagia, unspecified: R13.10

## 2012-05-12 LAB — CBC WITH DIFFERENTIAL/PLATELET
Basophils Absolute: 0 10*3/uL (ref 0.0–0.1)
Basophils Relative: 0 % (ref 0–1)
Eosinophils Absolute: 0.1 10*3/uL (ref 0.0–0.7)
Eosinophils Relative: 1 % (ref 0–5)
HCT: 42.5 % (ref 36.0–46.0)
Hemoglobin: 14.6 g/dL (ref 12.0–15.0)
Lymphocytes Relative: 17 % (ref 12–46)
Lymphs Abs: 1.8 10*3/uL (ref 0.7–4.0)
MCH: 30.4 pg (ref 26.0–34.0)
MCHC: 34.4 g/dL (ref 30.0–36.0)
MCV: 88.4 fL (ref 78.0–100.0)
Monocytes Absolute: 0.8 10*3/uL (ref 0.1–1.0)
Monocytes Relative: 8 % (ref 3–12)
Neutro Abs: 7.9 10*3/uL — ABNORMAL HIGH (ref 1.7–7.7)
Neutrophils Relative %: 75 % (ref 43–77)
Platelets: 393 10*3/uL (ref 150–400)
RBC: 4.81 MIL/uL (ref 3.87–5.11)
RDW: 13.6 % (ref 11.5–15.5)
WBC: 10.6 10*3/uL — ABNORMAL HIGH (ref 4.0–10.5)

## 2012-05-12 LAB — CBC
MCH: 30.6 pg (ref 26.0–34.0)
Platelets: 386 10*3/uL (ref 150–400)
RBC: 4.47 MIL/uL (ref 3.87–5.11)
WBC: 10 10*3/uL (ref 4.0–10.5)

## 2012-05-12 LAB — BASIC METABOLIC PANEL
BUN: 20 mg/dL (ref 6–23)
CO2: 23 mEq/L (ref 19–32)
Calcium: 9.7 mg/dL (ref 8.4–10.5)
Chloride: 98 mEq/L (ref 96–112)
Creatinine, Ser: 1.09 mg/dL (ref 0.50–1.10)
GFR calc Af Amer: 55 mL/min — ABNORMAL LOW (ref >90)
GFR calc non Af Amer: 48 mL/min — ABNORMAL LOW (ref >90)
Glucose, Bld: 93 mg/dL (ref 70–99)
Potassium: 3.1 mEq/L — ABNORMAL LOW (ref 3.5–5.1)
Sodium: 140 mEq/L (ref 135–145)

## 2012-05-12 LAB — CREATININE, SERUM: Creatinine, Ser: 1.02 mg/dL (ref 0.50–1.10)

## 2012-05-12 MED ORDER — DIAZEPAM 5 MG PO TABS
5.0000 mg | ORAL_TABLET | Freq: Three times a day (TID) | ORAL | Status: DC | PRN
  Administered 2012-05-13: 5 mg via ORAL
  Filled 2012-05-12: qty 1

## 2012-05-12 MED ORDER — ONDANSETRON HCL 4 MG/2ML IJ SOLN: 4.0000 mg | Freq: Four times a day (QID) | INTRAMUSCULAR | Status: DC | PRN

## 2012-05-12 MED ORDER — ENOXAPARIN SODIUM 40 MG/0.4ML ~~LOC~~ SOLN
40.0000 mg | SUBCUTANEOUS | Status: DC
  Administered 2012-05-12 – 2012-05-14 (×3): 40 mg via SUBCUTANEOUS
  Filled 2012-05-12 (×4): qty 0.4

## 2012-05-12 MED ORDER — GUAIFENESIN-DM 100-10 MG/5ML PO SYRP
5.0000 mL | ORAL_SOLUTION | ORAL | Status: DC | PRN
  Filled 2012-05-12: qty 5

## 2012-05-12 MED ORDER — ONDANSETRON HCL 4 MG PO TABS: 4.0000 mg | ORAL_TABLET | Freq: Four times a day (QID) | ORAL | Status: DC | PRN

## 2012-05-12 MED ORDER — METOPROLOL TARTRATE 1 MG/ML IV SOLN
5.0000 mg | Freq: Three times a day (TID) | INTRAVENOUS | Status: DC
  Administered 2012-05-12 – 2012-05-14 (×7): 5 mg via INTRAVENOUS
  Filled 2012-05-12 (×9): qty 5

## 2012-05-12 MED ORDER — DEXTROSE-NACL 5-0.9 % IV SOLN
INTRAVENOUS | Status: DC
  Administered 2012-05-12: 20:00:00 via INTRAVENOUS

## 2012-05-12 MED ORDER — HYDRALAZINE HCL 20 MG/ML IJ SOLN
5.0000 mg | Freq: Three times a day (TID) | INTRAMUSCULAR | Status: DC | PRN
  Filled 2012-05-12: qty 0.25

## 2012-05-12 MED ORDER — POTASSIUM CHLORIDE 10 MEQ/100ML IV SOLN
10.0000 meq | INTRAVENOUS | Status: AC
  Administered 2012-05-12 – 2012-05-13 (×2): 10 meq via INTRAVENOUS
  Filled 2012-05-12 (×2): qty 100

## 2012-05-12 NOTE — ED Notes (Signed)
Patient speaks with a very wet voice and a frequent wet cough. States she hasnt had anything to eat or drink since Sunday night. States had a barium swallow today that shows she is aspirating. States that she has had trouble swallowing for the past 7 years.states her esophagus closes up when any liquid goes down. Pt is aware she is waiting for the hospitalist to see her and evaluate

## 2012-05-12 NOTE — Telephone Encounter (Signed)
Left a message for return call from pt's daughter.

## 2012-05-12 NOTE — Telephone Encounter (Signed)
Unable to reach Savannah Diaz so I left her a message saying Dr Rhea Belton wants her to thicken liquids to the consistency of pudding per can instructions of Thicket. Do not let her drink thru a straw!

## 2012-05-12 NOTE — ED Provider Notes (Signed)
History     CSN: [id number]  Arrival date & time 05/12/12  1319   First MD Initiated Contact with Patient 05/12/12 1401      Chief Complaint  Patient presents with  . Dysphagia  . Cough    (Consider location/radiation/quality/duration/timing/severity/associated sxs/prior treatment) HPI Savannah Diaz is a 79 year old female with a history of GERD, HTN, and anxiety who presents to the ED with worsening dysphagia and cough. 3 days ago she was taking one of her medications and it got stuck. She reports having dysphagia especially when taking medications PO for the last 7 years. 2 days ago she was still having some dysphagia and went to her PCP. They referred her to GI who performed an endoscopy on her. They noted a narrow esophagus and some changes in the upper third of the esophagus, most likely due to trauma from swallowing her pill. They scheduled for her to have a barium swallow and chest x-ray this morning. On the barium swallow, she was noted to aspirate the barium with each swallow. Her daughter reports they were told that her epiglottis doesn't shut properly and she was told to drink thick liquids. The chest x-ray didn't reveal any consolidation. She has only had half a glass of water and one egg yesterday. She has been extremely thirsty, but is afraid to drink anything because of the aspiration risk. She has an appointment set up with ENT in 2 days. Her dysphagia was worsening and she has been coughing up saliva, so she called the ENT her told her to come to the ED. She reports having a mild HA, but states this is probably because she is dehydrated. She denies fever, chills, nausea, vomiting, diarrhea, and constipation.  Past Medical History  Diagnosis Date  . Hypertension   . Anxiety   . Arthritis   . Cataract     left  . Depression   . GERD (gastroesophageal reflux disease)   . Osteoporosis   . Headache     hx of migraines    Past Surgical History  Procedure Laterality Date  .  Cataract extraction      left  . Esophagogastroduodenoscopy N/A 05/10/2012    Procedure: ESOPHAGOGASTRODUODENOSCOPY (EGD);  Surgeon: Beverley Fiedler, MD;  Location: [facility];  Service: Gastroenterology;  Laterality: N/A;    Family History  Problem Relation Age of Onset  . Arthritis Mother   . Hypertension Mother   . Dementia Mother   . Cancer Father     colon     History  Substance Use Topics  . Smoking status: Never Smoker   . Smokeless tobacco: Never Used  . Alcohol Use: No    OB History   Grav Para Term Preterm Abortions TAB SAB Ect Mult Living                  Review of Systems All other systems negative except as documented in the HPI. All pertinent positives and negatives as reviewed in the HPI.  Allergies  Sulfa antibiotics  Home Medications   Current Outpatient Rx  Name  Route  Sig  Dispense  Refill  . amitriptyline (ELAVIL) 50 MG tablet   Oral   Take 225 mg by mouth at bedtime.          Marland Kitchen atenolol (TENORMIN) 50 MG tablet   Oral   Take 1.5 tablets (75 mg total) by mouth daily.   180 tablet   4   . diazepam (VALIUM) 5 MG  tablet   Oral   Take 1 tablet (5 mg total) by mouth every 8 (eight) hours as needed.   60 tablet   4   . lisinopril (PRINIVIL,ZESTRIL) 40 MG tablet   Oral   Take 40 mg by mouth daily.           BP 159/89  Pulse 115  Temp(Src) 99.6 F (37.6 C) (Oral)  Resp 18  SpO2 99%  Physical Exam  Constitutional: She is oriented to person, place, and time. She appears well-developed and well-nourished.  HENT:  Head: Normocephalic and atraumatic.  Mouth/Throat: Mucous membranes are dry.  Dry mucus membranes. Some saliva accumulating in posterior pharynx.  Eyes: EOM are normal. Pupils are equal, round, and reactive to light.  Pallor of conjunctiva.  Neck: Normal range of motion. Neck supple.  Cardiovascular: Normal rate, regular rhythm and intact distal pulses.   Pulmonary/Chest: Effort normal and breath sounds normal. No  respiratory distress.  Few bibasilar rhonchi.  Abdominal: Soft. Bowel sounds are normal. She exhibits no distension. There is no tenderness.  Neurological: She is alert and oriented to person, place, and time. No cranial nerve deficit. She exhibits normal muscle tone. Coordination normal.  Skin: Skin is warm, dry and intact. No rash noted.  Tenting of skin on dorsum of hands.    ED Course  Procedures (including critical care time)  Labs Reviewed - No data to display Dg Chest 2 View  05/12/2012  *RADIOLOGY REPORT*  Clinical Data: Esophageal narrowing.  Rule out aspiration.  CHEST - 2 VIEW  Comparison: None.  Findings: Lungs are markedly hyperaerated.  Mild bronchitic changes and interstitial prominence.  No consolidation or mass.  No pneumothorax or pleural effusion.  Minimal subsegmental atelectasis at the left base.  Minimal T9 compression fracture with 20% loss of height anteriorly and depression of the superior endplate is of indeterminate age. Streaky opacities at the medial lung bases may represent bronchiectasis.  IMPRESSION: Changes related to COPD.  T9 compression deformity of indeterminate age.  No evidence of consolidation.  Possible basilar bronchiectasis.  This can be seen with chronic recurring aspiration.  CT can be performed to further characterize.   Original Report Authenticated By: Jolaine Click, M.D.    Dg Esophagus  05/12/2012  *RADIOLOGY REPORT*  Clinical Data: Dysphagia.  ESOPHOGRAM/BARIUM SWALLOW  Technique:  Single contrast examination was performed using thin barium.  Fluoroscopy time:  1 minute 39 seconds.  Comparison:  None.  Findings:  The patient has aspiration of the thin barium with each swallow. There is  very limited deflection of the epiglottis with swallowing.  There is marked retention of barium in the valleculae and piriform sinuses.  The patient also swallows a large amount of air that distends the valleculae and piriform sinuses.  There is spillover of secretions  into the trachea from the piriform sinuses.  There is suboptimal visualization of the remainder of the esophagus but there is no obstruction.  No mass lesion.  IMPRESSION: Recurrent aspiration of contrast with each swallow due to abnormal motion of the epiglottis.  Retention of secretions and barium in the valleculae and piriform sinuses which does not clear with repeated swallowing.  Spillover from the piriform sinuses into the trachea.  The esophagus appears normal.  I gave the patient percussion and postural drainage at the completion of the procedure to remove as much of the retained secretions and barium from the piriform sinuses and from the airway as possible.  Mrs. Palen tolerated the procedure well.  Original Report Authenticated By: Francene Boyers, M.D.      Patient is clinically dehydrated on exam. I feel she will need observation and gentle fluid resuscitation. The patient has been stable otherwise. Patient will be admitted for this dehydration   MDM  MDM Reviewed: vitals and nursing note Interpretation: labs and x-ray Consults: admitting MD            Carlyle Dolly, PA-C 05/14/12 [phone]

## 2012-05-12 NOTE — Telephone Encounter (Signed)
Patient Information:  Caller Name: Herbert Seta  Phone: [phone]  Patient: Savannah Diaz, Savannah Diaz  Gender: Female  DOB: 04/13/1932  Age: 79 Years  PCP: Eleonore Chiquito [facility]  Office Follow Up:  Does the office need to follow up with this patient?: Yes  Instructions For The Office: Please review note from todays medical procedure  RN Note:  while on phone with daughter. GI called her requesting to start Thickend Liquids. They requested her to make an immediate  ENT appt .  Advised daughter that Office does not do IV fluids but we can hopefully assist her with other needs. Encouraged her to speak with GI and follow there recommendations. Advised I would forward message to Dr.K. office for assistance  Symptoms  Reason For Call & Symptoms: Daughter is calling back concerning her mother .  She was recently seen for inablilty to swallow. she has been seen by Gastroenterology had Endoscopy on Monday 05/10/12 and Barium swallow today 05/12/12 (test just completed).  "the thing that is over her windpipe is not closing and she is prone to aspiration".  Daughter is concerned because her mother has not eaten or had anything to drink".  Last UOP- this morning. What does she do Now?  Reviewed Health History In EMR: Yes  Reviewed Medications In EMR: Yes  Reviewed Allergies In EMR: Yes  Reviewed Surgeries / Procedures: Yes  Date of Onset of Symptoms: 05/09/2012  Treatments Tried: Endoscopy and Barium swallowing  Treatments Tried Worked: No  Guideline(s) Used:  No Protocol Available - Sick Adult  Disposition Per Guideline:   Discuss with PCP and Callback by Nurse Today  Reason For Disposition Reached:   Nursing judgment  Advice Given:  Call Back If:  New symptoms develop  You become worse.  Patient Will Follow Care Advice:  YES

## 2012-05-12 NOTE — ED Notes (Signed)
Takira Sherrin daughter [phone]

## 2012-05-12 NOTE — Telephone Encounter (Signed)
Spoke with daughter , Herbert Seta to inform her of results of BS and Chest XRAY and the need for an ENT referral. She will call Dr Myrtis Hopping to schedule the appt , but I have already called and gave [facility] info on the pt and I will fax notes to [phone]. Called pt's pharmacy and pt an buy Thicket OTC at drug stores with instructions. Left a message for Speech to call be back. lmom for Heather to call back.

## 2012-05-12 NOTE — H&P (Signed)
[facility] History and Physical  Kynsie Falkner JYN:[id number] DOB: 04/28/1932 DOA: 05/12/2012   PCP: Rogelia Boga, MD  Specialists: Dr Rhea Belton  Chief Complaint: feeling weak,   HPI: Savannah Diaz is a 79 y.o. female with h/o hypertension, recently was seen by Dr Rhea Belton for dysphagia and underwent EGD on 05/10/12 for dysphagia, hasn't been able to take water or food since Sunday. She also reports sob, and cough since Sunday. She reports similar symptoms 7 years ago which has resolved spontaneously. On arrival to ED, she was found to be dehydrated. Her labs showed hypokalemia an dmild leukocytosis. She isbeing admitted to medical service for observation for IV fluids, and evaluation of dysphagia. She also reports she is supposed to get a repeat EGD with dilatation of the esophagus.   Review of Systems: The patient denies anorexia, fever, weight loss,, vision loss, decreased hearing, hoarseness, chest pain, syncope,  peripheral edema, balance deficits, hemoptysis, abdominal pain, melena, hematochezia, severe indigestion/heartburn, hematuria, incontinence, genital sores, muscle weakness, suspicious skin lesions, transient blindness, difficulty walking, depression, unusual weight change, abnormal bleeding, enlarged lymph nodes, angioedema, and breast masses.    Past Medical History  Diagnosis Date  . Hypertension   . Anxiety   . Arthritis   . Cataract     left  . Depression   . GERD (gastroesophageal reflux disease)   . Osteoporosis   . Headache     hx of migraines   Past Surgical History  Procedure Laterality Date  . Cataract extraction      left  . Esophagogastroduodenoscopy N/A 05/10/2012    Procedure: ESOPHAGOGASTRODUODENOSCOPY (EGD);  Surgeon: Beverley Fiedler, MD;  Location: [facility];  Service: Gastroenterology;  Laterality: N/A;   Social History:  reports that she has never smoked. She has never used smokeless tobacco. She reports that she does not drink alcohol or use  illicit drugs.  where does patient live-- home  Allergies  Allergen Reactions  . Sulfa Antibiotics Hives    Childhood allergy     Family History  Problem Relation Age of Onset  . Arthritis Mother   . Hypertension Mother   . Dementia Mother   . Cancer Father     colon     Prior to Admission medications   Medication Sig Start Date End Date Taking? Authorizing Provider  amitriptyline (ELAVIL) 50 MG tablet Take 225 mg by mouth at bedtime.  04/28/11  Yes Gordy Savers, MD  atenolol (TENORMIN) 50 MG tablet Take 1.5 tablets (75 mg total) by mouth daily. 04/28/11  Yes Gordy Savers, MD  diazepam (VALIUM) 5 MG tablet Take 1 tablet (5 mg total) by mouth every 8 (eight) hours as needed. 04/28/11  Yes Gordy Savers, MD  lisinopril (PRINIVIL,ZESTRIL) 40 MG tablet Take 40 mg by mouth daily.   Yes Historical Provider, MD   Physical Exam: Filed Vitals:   05/12/12 1342 05/12/12 1500 05/12/12 1650  BP: 159/89 156/86 172/85  Pulse: 115 107 110  Temp: 99.6 F (37.6 C)  99 F (37.2 C)  TempSrc: Oral  Oral  Resp: 18 16 16   SpO2: 99% 99% 100%    Constitutional: Vital signs reviewed.  Patient is a poorly -nourished  in no acute distress and cooperative with exam. Alert and oriented x3.  Head: Normocephalic and atraumatic Mouth: no erythema or exudates, dry MM Eyes: PERRL, EOMI, conjunctivae normal, No scleral icterus.  Neck: Supple, Trachea midline normal ROM, No JVD, mass, thyromegaly, or carotid bruit present.  Cardiovascular: RR tachycardic,  S1 normal, S2 normal, no MRG, pulses symmetric and intact bilaterally Pulmonary/Chest:  Bibasilar rales. No wheezing or rhonchi heard.  Abdominal: Soft. Non-tender, non-distended, bowel sounds are normal, no masses, organomegaly, or guarding present.  Musculoskeletal: No joint deformities, erythema, or stiffness, ROM full and no nontender Neurological: A&O x3, Strength is normal and symmetric bilaterally, cranial nerve II-XII are grossly  intact, no focal motor deficit, sensory intact to light touch bilaterally.  Skin: Warm, dry and intact. No rash, cyanosis, or clubbing.  Psychiatric: Normal mood and affect.  Labs on Admission:  Basic Metabolic Panel:  Recent Labs Lab 05/12/12 1459  NA 140  K 3.1*  CL 98  CO2 23  GLUCOSE 93  BUN 20  CREATININE 1.09  CALCIUM 9.7   Liver Function Tests: No results found for this basename: AST, ALT, ALKPHOS, BILITOT, PROT, ALBUMIN,  in the last 168 hours No results found for this basename: LIPASE, AMYLASE,  in the last 168 hours No results found for this basename: AMMONIA,  in the last 168 hours CBC:  Recent Labs Lab 05/12/12 1459  WBC 10.6*  NEUTROABS 7.9*  HGB 14.6  HCT 42.5  MCV 88.4  PLT 393   Cardiac Enzymes: No results found for this basename: CKTOTAL, CKMB, CKMBINDEX, TROPONINI,  in the last 168 hours  BNP (last 3 results) No results found for this basename: PROBNP,  in the last 8760 hours CBG: No results found for this basename: GLUCAP,  in the last 168 hours  Radiological Exams on Admission: Dg Chest 2 View  05/12/2012  *RADIOLOGY REPORT*  Clinical Data: Esophageal narrowing.  Rule out aspiration.  CHEST - 2 VIEW  Comparison: None.  Findings: Lungs are markedly hyperaerated.  Mild bronchitic changes and interstitial prominence.  No consolidation or mass.  No pneumothorax or pleural effusion.  Minimal subsegmental atelectasis at the left base.  Minimal T9 compression fracture with 20% loss of height anteriorly and depression of the superior endplate is of indeterminate age. Streaky opacities at the medial lung bases may represent bronchiectasis.  IMPRESSION: Changes related to COPD.  T9 compression deformity of indeterminate age.  No evidence of consolidation.  Possible basilar bronchiectasis.  This can be seen with chronic recurring aspiration.  CT can be performed to further characterize.   Original Report Authenticated By: Jolaine Click, M.D.    Dg  Esophagus  05/12/2012  *RADIOLOGY REPORT*  Clinical Data: Dysphagia.  ESOPHOGRAM/BARIUM SWALLOW  Technique:  Single contrast examination was performed using thin barium.  Fluoroscopy time:  1 minute 39 seconds.  Comparison:  None.  Findings:  The patient has aspiration of the thin barium with each swallow. There is  very limited deflection of the epiglottis with swallowing.  There is marked retention of barium in the valleculae and piriform sinuses.  The patient also swallows a large amount of air that distends the valleculae and piriform sinuses.  There is spillover of secretions into the trachea from the piriform sinuses.  There is suboptimal visualization of the remainder of the esophagus but there is no obstruction.  No mass lesion.  IMPRESSION: Recurrent aspiration of contrast with each swallow due to abnormal motion of the epiglottis.  Retention of secretions and barium in the valleculae and piriform sinuses which does not clear with repeated swallowing.  Spillover from the piriform sinuses into the trachea.  The esophagus appears normal.  I gave the patient percussion and postural drainage at the completion of the procedure to remove as much of the retained secretions and  barium from the piriform sinuses and from the airway as possible.  Mrs. Micallef tolerated the procedure well.   Original Report Authenticated By: Francene Boyers, M.D.     EKG: pending  Assessment/Plan Active Problems:    1. Dehydration: possibly from decreased po intake over the last few days. Start pt on IV fluids NS D5 at 75 ml/hr.   2. Dysphagia: keep pt NPO. Will obtain a SLP evaluation. Please obtain GI consult in am for further recommendations.   3. Abnormal changes on CXR: possibly from chronic aspiration. She is saturating 99% on RA. She is afebrile , no leukocytosis, . She would benefit from pulmonary consult outpatient with a CT chest with contrast.   4. Hypokalemia: replete as needed.   5. Tachycardia: possibly from  dehydration. Fluids.   6. Hypertension: not well controlled. Start the patient on IV hydralazine prn.   7. DVT prophylaxis.  Code Status: full code Family Communication: none at bedside, couldn't reach her daughter.  Disposition Plan: pending.     [facility] Pager [phone]  If 7PM-7AM, please contact night-coverage [email or web address] Password [facility] 05/12/2012, 4:58 PM

## 2012-05-12 NOTE — ED Notes (Signed)
REPORT CALLED

## 2012-05-12 NOTE — ED Notes (Signed)
Pt reports swallowing a pill on Sunday, ever since having increase in saliva and difficulty swallowing since. Went to pcp and sent for endo on Monday, then sent for chest xray and barium study done this am. Was told that she was aspirating and family brought her here due to increase in saliva and cough. Airway intact at triage, resp e/u. Skin w/d.

## 2012-05-12 NOTE — Progress Notes (Signed)
Patient alert and oriented from the ed without any c/o's.  Oriented patient to unit, call bell in reach and discussed safety plan for the night ( calling for assistance going to the bathroom etc).  Noted that patient had a very wet cough that was productive at times but denied any c/o's.

## 2012-05-13 ENCOUNTER — Observation Stay (HOSPITAL_COMMUNITY): Payer: Medicare Other

## 2012-05-13 DIAGNOSIS — I10 Essential (primary) hypertension: Secondary | ICD-10-CM | POA: Diagnosis not present

## 2012-05-13 DIAGNOSIS — E876 Hypokalemia: Secondary | ICD-10-CM | POA: Diagnosis not present

## 2012-05-13 DIAGNOSIS — R1310 Dysphagia, unspecified: Secondary | ICD-10-CM

## 2012-05-13 DIAGNOSIS — E860 Dehydration: Secondary | ICD-10-CM | POA: Diagnosis not present

## 2012-05-13 DIAGNOSIS — E870 Hyperosmolality and hypernatremia: Secondary | ICD-10-CM | POA: Diagnosis present

## 2012-05-13 LAB — COMPREHENSIVE METABOLIC PANEL
ALT: 26 U/L (ref 0–35)
Calcium: 8.5 mg/dL (ref 8.4–10.5)
GFR calc Af Amer: 74 mL/min — ABNORMAL LOW (ref >90)
Glucose, Bld: 132 mg/dL — ABNORMAL HIGH (ref 70–99)
Sodium: 146 mEq/L — ABNORMAL HIGH (ref 135–145)
Total Protein: 6.1 g/dL (ref 6.0–8.3)

## 2012-05-13 LAB — CBC
MCV: 89.1 fL (ref 78.0–100.0)
Platelets: 339 10*3/uL (ref 150–400)
RBC: 4.03 MIL/uL (ref 3.87–5.11)
WBC: 8.5 10*3/uL (ref 4.0–10.5)

## 2012-05-13 LAB — MAGNESIUM: Magnesium: 1.8 mg/dL (ref 1.5–2.5)

## 2012-05-13 LAB — TSH: TSH: 0.582 u[IU]/mL (ref 0.350–4.500)

## 2012-05-13 MED ORDER — BIOTENE DRY MOUTH MT LIQD
15.0000 mL | Freq: Two times a day (BID) | OROMUCOSAL | Status: DC
  Administered 2012-05-13 – 2012-05-15 (×5): 15 mL via OROMUCOSAL

## 2012-05-13 MED ORDER — POTASSIUM CHLORIDE 10 MEQ/100ML IV SOLN
10.0000 meq | INTRAVENOUS | Status: AC
  Administered 2012-05-13 (×4): 10 meq via INTRAVENOUS
  Filled 2012-05-13 (×4): qty 100

## 2012-05-13 MED ORDER — POTASSIUM CL IN DEXTROSE 5% 20 MEQ/L IV SOLN
20.0000 meq | INTRAVENOUS | Status: DC
  Administered 2012-05-13 – 2012-05-14 (×2): 20 meq via INTRAVENOUS
  Filled 2012-05-13 (×4): qty 1000

## 2012-05-13 MED ORDER — LORAZEPAM 2 MG/ML IJ SOLN
1.0000 mg | Freq: Three times a day (TID) | INTRAMUSCULAR | Status: DC | PRN
  Administered 2012-05-13 – 2012-05-14 (×2): 1 mg via INTRAVENOUS
  Filled 2012-05-13 (×2): qty 1

## 2012-05-13 NOTE — Procedures (Signed)
Objective Swallowing Evaluation: Modified Barium Swallowing Study  Patient Details  Name: Ellis Koffler MRN: [id number] Date of Birth: 11/22/1932  Today's Date: 05/13/2012 Time: 1005-1030 SLP Time Calculation (min): 25 min  Past Medical History:  Past Medical History  Diagnosis Date  . Hypertension   . Anxiety   . Cataract     left  . Depression   . GERD (gastroesophageal reflux disease)   . Osteoporosis   . Dysphagia     "have had it for 7 yr; got worse 4 days ago" (05/12/2012)  . Migraines     "menopause cured them" (05/12/2012)  . Arthritis     "qwhere; but it doesn't hurt" (05/12/2012)   Past Surgical History:  Past Surgical History  Procedure Laterality Date  . Cataract extraction w/ intraocular lens implant Left ? 2010  . Esophagogastroduodenoscopy N/A 05/10/2012    Procedure: ESOPHAGOGASTRODUODENOSCOPY (EGD);  Surgeon: Beverley Fiedler, MD;  Location: [facility];  Service: Gastroenterology;  Laterality: N/A;  . Tonsillectomy  1940's   HPI:  Maybelline Kolarik is a 79 y.o. female with h/o hypertension, recently was seen by Dr Rhea Belton for dysphagia and underwent EGD on 05/10/12 for dysphagia, hasn't been able to take water or food since Sunday. Pt. reports Sunday a pill got lodged in her throat and she has been expectorating mucous with wet vocal qualtiy and coughing on her secretions since that time.   Pt. also reports difficulty swallowing for the past 7 years (esophageal) which she has compensated for. Pt. had EGD 05/10/12 revealing abnormal mucousa on upper 1/3 of esophagus with question of pill induced injury and smal hiatal hernia. Barium esophagram 05/12/12 showed frequent aspiration with thin barium and significant pharyngeal stasis.  She reports she needs a repeat EGD with dilatation of the esophagus which was not done during initial procedure due to esophageal trauma as reported by the pt.     Assessment / Plan / Recommendation Clinical Impression  Dysphagia Diagnosis: Moderate  pharyngeal phase dysphagia;Moderate cervical esophageal phase dysphagia;Severe cervical esophageal phase dysphagia Clinical impression: Oral phase of MBS appeared within functional limits.  Pt. exhibited a moderate pharyngeal and moderate-severe cervical esophageal phase dysphagia with decreased laryngeal elevation resulting in maximum amount of pyriform sinus residue.  Significantly reduced UES opening as a result of poor laryngeal elevation as well as decreased epiglotic inversion leading to aspiration during the swallow of nectar thick barium and during multiple swallows in attempts to clear pyriform sinus residue.  She exhibited significant difficulty initiating multiple volitional swallows in attempts to clear residue.  Thin barium appeared safer with only one episode of flash penetration due to increased bolus weight and volume to assist in UES opening as well as significantly decreased pyriform sinus residue.  Pharyngeal phase also characterized by decreased pharyngeal contraction causing mild posterior pharyngeal wall residue intermittently.  Pharygneal strength appeared to increase as study progressed.  Pt. spontaneously performed a chin tuck with majority of trials stating "this helps me."  Esophagus was scanned which revealed barium descending then ascending mid esophagus throughout study.  Etiology may be possible neurological involvement as swallow function appears to be indicative of  versus primary esophageal impairments.  SLP recommends Dys 1 diet texture (due to increased cohesiveness of puree and will advance as pt. able) and thin liquids avoiding straws, multiple dry swallows, alternate solids/liquids sit upright, stay upright 1 hour after meals and crush pills.  Recommendations discussed with pt. and MD (discussed possibility of neuro consult) and continued ST for safety with  diet.    Treatment Recommendation  Therapy as outlined in treatment plan below    Diet Recommendation Dysphagia 1  (Puree);Thin liquid   Liquid Administration via: Cup;No straw Medication Administration: Crushed with puree Supervision: Patient able to self feed;Intermittent supervision to cue for compensatory strategies Compensations: Slow rate;Small sips/bites;Multiple dry swallows after each bite/sip;Follow solids with liquid Postural Changes and/or Swallow Maneuvers: Seated upright 90 degrees;Upright 30-60 min after meal;Chin tuck    Other  Recommendations Oral Care Recommendations: Oral care BID   Follow Up Recommendations   (to be determined)    Frequency and Duration min 2x/week  2 weeks   Pertinent Vitals/Pain     SLP Swallow Goals Patient will utilize recommended strategies during swallow to increase swallowing safety with: Modified independent assistance      Reason for Referral Objectively evaluate swallowing function   Oral Phase Oral Preparation/Oral Phase Oral Phase: WFL   Pharyngeal Phase Pharyngeal Phase Pharyngeal Phase: Impaired Pharyngeal - Nectar Pharyngeal - Nectar Teaspoon: Reduced pharyngeal peristalsis;Reduced epiglottic inversion;Penetration/Aspiration during swallow;Moderate aspiration;Reduced laryngeal elevation;Pharyngeal residue - pyriform sinuses;Pharyngeal residue - valleculae;Reduced anterior laryngeal mobility;Reduced airway/laryngeal closure (Pt. performed spontaneous chin tuck, max residue pyriform si) Penetration/Aspiration details (nectar teaspoon): Material enters airway, passes BELOW cords and not ejected out despite cough attempt by patient;Material enters airway, remains ABOVE vocal cords and not ejected out Pharyngeal - Nectar Cup: Reduced pharyngeal peristalsis;Reduced epiglottic inversion;Reduced anterior laryngeal mobility;Reduced laryngeal elevation;Reduced airway/laryngeal closure;Penetration/Aspiration during swallow;Pharyngeal residue - pyriform sinuses;Pharyngeal residue - valleculae Penetration/Aspiration details (nectar cup): Material enters  airway, CONTACTS cords and not ejected out;Material enters airway, passes BELOW cords without attempt by patient to eject out (silent aspiration) Pharyngeal - Thin Pharyngeal - Thin Teaspoon: Pharyngeal residue - pyriform sinuses;Reduced laryngeal elevation Pharyngeal - Thin Cup: Penetration/Aspiration during swallow;Reduced anterior laryngeal mobility;Reduced epiglottic inversion;Reduced airway/laryngeal closure;Reduced laryngeal elevation;Pharyngeal residue - pyriform sinuses;Pharyngeal residue - valleculae;Reduced tongue base retraction;Reduced pharyngeal peristalsis Penetration/Aspiration details (thin cup): Material enters airway, remains ABOVE vocal cords then ejected out Pharyngeal - Solids Pharyngeal - Puree: Reduced pharyngeal peristalsis;Pharyngeal residue - valleculae;Pharyngeal residue - pyriform sinuses;Reduced anterior laryngeal mobility;Reduced laryngeal elevation;Reduced tongue base retraction Pharyngeal - Mechanical Soft: Pharyngeal residue - posterior pharnyx;Reduced laryngeal elevation  Cervical Esophageal Phase        Cervical Esophageal Phase Cervical Esophageal Phase: Impaired (Decreased UES opening)         Breck Coons Romona Murdy M.Ed [employer] [phone]  05/13/2012

## 2012-05-13 NOTE — ED Provider Notes (Signed)
Pt care assumed from [facility], PA-C.  Pt is a 79 y.o. female who presents with dehydration after having a barium swallow which shows your esophagus and aspiration.  [facility] will evaluate the patient for admission but is awaiting lab results.  Plan: Discuss lab results with [facility] after they're complete for admission.  5:30PM Discussed final results with [facility] who will evaluate and admit.  Dahlia Client Vinaya Sancho, PA-C 05/13/12 [phone]

## 2012-05-13 NOTE — Consult Note (Signed)
Referring Provider: Dr Waymon Amato- [facility] Primary Care Physician:  Rogelia Boga, MD Primary Gastroenterologist:  Dr.Pyrtle  Reason for Consultation:  Dysphagia  HPI: Savannah Diaz is a 79 y.o. female recently known to GI from initial evaluation in the office earlier this week. She was seen on 05/10/2012 as an urgent work in for acute onset of dysphagia. Patient related that she had had some difficulty swallowing over the past 6 or 7 years and had altered her diet to compensate. She says she cuts her pills up in small pieces and also cuts her food up into very small pieces and eats very slowly and as long as she does if she has generally done fine. She had an episode on the evening of 05/09/2012 with a feeling that one of her pills had gotten stuck late in the evening. She says when this occurred she started having coughing and a choking feeling and then started producing a lot of phlegm in head been unable to swallow since. She was up all the night coughing and spitting and was very uncomfortable but says she never regurgitated up any of the pills. She has not vomited. Initially  she felt she was unable to swallow her saliva but as the morning had gone on she was able to swallow her own saliva, though not able to drink anything without spitting it back out .her daughter states that she was barely all able to speak earlier in the morning and in her voice was very very wet and gargly   When seen in the office she was still coughing some and had  a very wet voice quality good increased coughing when she tried to talk. We set her up for urgent upper endoscopy with concerns for possible foreign body impaction i.e. pill impaction in her esophagus EGD was done by Dr. Rhea Belton in there was no evidence of any pill impaction in the esophagus and no evidence of esophageal stricture. She did have some erythema and inflammation in the upper esophagus which is felt secondary to pill trauma.   She was then set up  for barium swallow and ENT evaluation.. Barium swallow on 3/19 showed recurrent aspiration of contrast with each swallow due to abnormal motion  of the epiglottis. There was retention of secretions and barium in the vallecula and perform sinuses which did not clear with repeated swallowing and clear spill over into the trachea. She had subsequent chest x-ray today which did not show any evidence of aspiration pneumonia. He was waiting for ENT evaluation but still having very significant difficulty swallowing and was brought onto the emergency room last evening for admission. She has had Speech path eval  this morning with preliminary report showing aspiration of thick liquids and solid foods and no frank aspiration with thin liquids or pured. Final report is pending. She is scheduled for MRI of the brain to rule out common current neuro event and ENT is to see while here in the hospital. Pt says she felt better after swallowing study this am because food seemed to push down her secretions but now has a lot of saliva/secretions building up again and voice wet. She tried to take some applesauce with a pill and says it wouldn't go down-now afraid again.   Past Medical History  Diagnosis Date  . Hypertension   . Anxiety   . Cataract     left  . Depression   . GERD (gastroesophageal reflux disease)   . Osteoporosis   . Dysphagia     "  have had it for 7 yr; got worse 4 days ago" (05/12/2012)  . Migraines     "menopause cured them" (05/12/2012)  . Arthritis     "qwhere; but it doesn't hurt" (05/12/2012)    Past Surgical History  Procedure Laterality Date  . Cataract extraction w/ intraocular lens implant Left ? 2010  . Esophagogastroduodenoscopy N/A 05/10/2012    Procedure: ESOPHAGOGASTRODUODENOSCOPY (EGD);  Surgeon: Beverley Fiedler, MD;  Location: [facility];  Service: Gastroenterology;  Laterality: N/A;  . Tonsillectomy  1940's    Prior to Admission medications   Medication Sig Start Date End  Date Taking? Authorizing Provider  amitriptyline (ELAVIL) 50 MG tablet Take 225 mg by mouth at bedtime.  04/28/11  Yes Gordy Savers, MD  atenolol (TENORMIN) 50 MG tablet Take 1.5 tablets (75 mg total) by mouth daily. 04/28/11  Yes Gordy Savers, MD  diazepam (VALIUM) 5 MG tablet Take 1 tablet (5 mg total) by mouth every 8 (eight) hours as needed. 04/28/11  Yes Gordy Savers, MD  lisinopril (PRINIVIL,ZESTRIL) 40 MG tablet Take 40 mg by mouth daily.   Yes Historical Provider, MD    Current Facility-Administered Medications  Medication Dose Route Frequency Provider Last Rate Last Dose  . antiseptic oral rinse (BIOTENE) solution 15 mL  15 mL Mouth Rinse BID Elease Etienne, MD   15 mL at 05/13/12 1116  . dextrose 5 % with KCl 20 mEq / L  infusion  20 mEq Intravenous Continuous Elease Etienne, MD 75 mL/hr at 05/13/12 1401 20 mEq at 05/13/12 1401  . enoxaparin (LOVENOX) injection 40 mg  40 mg Subcutaneous Q24H Kathlen Mody, MD   40 mg at 05/12/12 1907  . guaiFENesin-dextromethorphan (ROBITUSSIN DM) 100-10 MG/5ML syrup 5 mL  5 mL Oral Q4H PRN Kathlen Mody, MD      . hydrALAZINE (APRESOLINE) injection 5 mg  5 mg Intravenous Q8H PRN Kathlen Mody, MD      . LORazepam (ATIVAN) injection 1 mg  1 mg Intravenous Q8H PRN Elease Etienne, MD      . metoprolol (LOPRESSOR) injection 5 mg  5 mg Intravenous Q8H Kathlen Mody, MD   5 mg at 05/13/12 0651  . ondansetron (ZOFRAN) tablet 4 mg  4 mg Oral Q6H PRN Kathlen Mody, MD       Or  . ondansetron (ZOFRAN) injection 4 mg  4 mg Intravenous Q6H PRN Kathlen Mody, MD      . potassium chloride 10 mEq in 100 mL IVPB  10 mEq Intravenous Q1 Hr x 4 Elease Etienne, MD        Allergies as of 05/12/2012 - Review Complete 05/12/2012  Allergen Reaction Noted  . Sulfa antibiotics Hives 05/10/2012    Family History  Problem Relation Age of Onset  . Arthritis Mother   . Hypertension Mother   . Dementia Mother   . Cancer Father     colon     History    Social History  . Marital Status: Widowed    Spouse Name: N/A    Number of Children: N/A  . Years of Education: N/A   Occupational History  . Not on file.   Social History Main Topics  . Smoking status: Never Smoker   . Smokeless tobacco: Never Used  . Alcohol Use: No     Comment: 05/12/2012 "have a drink hardly ever anymore"  . Drug Use: No  . Sexually Active: No   Other Topics Concern  . Not  on file   Social History Narrative  . No narrative on file    Review of Systems: Pertinent positive and negative review of systems were noted in the above HPI section.  All other review of systems was otherwise negative. Physical Exam: Vital signs in last 24 hours: Temp:  [98 F (36.7 C)-99 F (37.2 C)] 98 F (36.7 C) (03/20 0927) Pulse Rate:  [97-110] 100 (03/20 0927) Resp:  [16] 16 (03/20 0927) BP: (112-172)/(68-91) 127/76 mmHg (03/20 0927) SpO2:  [98 %-100 %] 100 % (03/20 0927) Weight:  [143 lb 8.3 oz (65.1 kg)] 143 lb 8.3 oz (65.1 kg) (03/19 1700)   General:   Alert,  Well-developed, well-nourished, thin elderly WF cooperative in NAD, voice very wet Head:  Normocephalic and atraumatic. Eyes:  Sclera clear, no icterus.   Conjunctiva pink. Ears:  Normal auditory acuity. Nose:  No deformity, discharge,  or lesions. Mouth:  No deformity or lesions.   Neck:  Supple; no masses or thyromegaly. Lungs:  Clear throughout to auscultation.   Few upper airway rhonchi Heart:  Regular rate and rhythm; no murmurs, clicks, rubs,  or gallops. Abdomen:  Soft,nontender, BS active,nonpalp mass or hsm.   Rectal:  Deferred  Msk:  Symmetrical without gross deformities. . Pulses:  Normal pulses noted. Extremities:  Without clubbing or edema. Neurologic:  Alert and  oriented x4;  grossly normal neurologically, anxious Skin:  Intact without significant lesions or rashes.. Psych:  Alert and cooperative. Normal mood and affect.  Intake/Output from previous day:   Intake/Output this shift:     Lab Results:  Recent Labs  05/12/12 1459 05/12/12 1749 05/13/12 0505  WBC 10.6* 10.0 8.5  HGB 14.6 13.7 12.2  HCT 42.5 39.7 35.9*  PLT 393 386 339   BMET  Recent Labs  05/12/12 1459 05/12/12 1749 05/13/12 0505  NA 140  --  146*  K 3.1*  --  3.1*  CL 98  --  109  CO2 23  --  26  GLUCOSE 93  --  132*  BUN 20  --  14  CREATININE 1.09 1.02 0.86  CALCIUM 9.7  --  8.5   LFT  Recent Labs  05/13/12 0505  PROT 6.1  ALBUMIN 3.2*  AST 20  ALT 26  ALKPHOS 73  BILITOT 1.0   PT/INR No results found for this basename: LABPROT, INR,  in the last 72 hours Hepatitis Panel No results found for this basename: HEPBSAG, HCVAB, HEPAIGM, HEPBIGM,  in the last 72 hours    Studies/Results: Dg Chest 2 View  05/12/2012  *RADIOLOGY REPORT*  Clinical Data: Esophageal narrowing.  Rule out aspiration.  CHEST - 2 VIEW  Comparison: None.  Findings: Lungs are markedly hyperaerated.  Mild bronchitic changes and interstitial prominence.  No consolidation or mass.  No pneumothorax or pleural effusion.  Minimal subsegmental atelectasis at the left base.  Minimal T9 compression fracture with 20% loss of height anteriorly and depression of the superior endplate is of indeterminate age. Streaky opacities at the medial lung bases may represent bronchiectasis.  IMPRESSION: Changes related to COPD.  T9 compression deformity of indeterminate age.  No evidence of consolidation.  Possible basilar bronchiectasis.  This can be seen with chronic recurring aspiration.  CT can be performed to further characterize.   Original Report Authenticated By: Jolaine Click, M.D.    Dg Esophagus  05/12/2012  *RADIOLOGY REPORT*  Clinical Data: Dysphagia.  ESOPHOGRAM/BARIUM SWALLOW  Technique:  Single contrast examination was performed using thin barium.  Fluoroscopy time:  1 minute 39 seconds.  Comparison:  None.  Findings:  The patient has aspiration of the thin barium with each swallow. There is  very limited deflection of  the epiglottis with swallowing.  There is marked retention of barium in the valleculae and piriform sinuses.  The patient also swallows a large amount of air that distends the valleculae and piriform sinuses.  There is spillover of secretions into the trachea from the piriform sinuses.  There is suboptimal visualization of the remainder of the esophagus but there is no obstruction.  No mass lesion.  IMPRESSION: Recurrent aspiration of contrast with each swallow due to abnormal motion of the epiglottis.  Retention of secretions and barium in the valleculae and piriform sinuses which does not clear with repeated swallowing.  Spillover from the piriform sinuses into the trachea.  The esophagus appears normal.  I gave the patient percussion and postural drainage at the completion of the procedure to remove as much of the retained secretions and barium from the piriform sinuses and from the airway as possible.  Mrs. Cochrane tolerated the procedure well.   Original Report Authenticated By: Francene Boyers, M.D.     IMPRESSION:  #1  79 yo female with acute on chronic dysphagia -acute event occurred on 05/09/12- and unable to swallow anything since. This is not an esophageal issue. She is aspirating and has an abnormal epiglottic  Movement, as well as extensive pooling in pyriform sinuses and valleculea. R/o Underlying neuromuscular disorder, or subacute neuro event.  PLAN: Await  ENT evaluation and MRI of brain Dysphagia 1 diet-pureed- and thin liquids per Speech Path for now   Shamila Lerch  05/13/2012, 2:05 PM

## 2012-05-13 NOTE — Progress Notes (Signed)
Speech Language Pathology Patient Details Name: Savannah Diaz MRN: [id number] DOB: 01-08-1933 Today's Date: 05/13/2012 Time:  -    Pt. Scheduled for MBS today at 10:00  Breck Coons [place] M.Ed [employer] [phone]  05/13/2012

## 2012-05-13 NOTE — Consult Note (Signed)
Patient seen, examined, and I agree with the above documentation, including the assessment and plan. Speech says okay with thin liquids and soft foods, but aspirated with thick liquids and solid foods.   EGD earlier in the week showed copious secretions in mouth and OP Agree with MRI to exclude CVA and ENT consult

## 2012-05-13 NOTE — Telephone Encounter (Signed)
Spoke to pt's daughter Herbert Seta told her referral was done for ENT and someone will contact her. Heather verbalized understanding and stated pt is in the hospital was admitted yesterday for unable to eat or drink. Told her okay I will let Dr. Amador Cunas know.

## 2012-05-13 NOTE — Progress Notes (Signed)
MD made aware via text page that daughter would like Savannah Diaz, ENT MD to be consulted for pt. Dr. Ezzard Standing has been seeing Savannah Diaz as an outpatient for trouble swallowing. [name], Rosanna Randy

## 2012-05-13 NOTE — Telephone Encounter (Signed)
Left message on machine.

## 2012-05-13 NOTE — Evaluation (Addendum)
Physical Therapy Evaluation Patient Details Name: Zala Degrasse MRN: [id number] DOB: May 04, 1932 Today's Date: 05/13/2012 Time: 1308-6578 PT Time Calculation (min): 33 min  PT Assessment / Plan / Recommendation Clinical Impression  79 yo female admitted wtih dehydrations.  She has problems with dysphagia and still has not eaten for several days.  On PT eval today, she is able to ambulate without device with +1 hand hold assist due to generalized weakness she attributes to not eating.  She will bnenfit from continued PT as an impatient but do not think she needs PT follow up or DME    PT Assessment  Patient needs continued PT services    Follow Up Recommendations  No PT follow up    Does the patient have the potential to tolerate intense rehabilitation      Barriers to Discharge        Equipment Recommendations  None recommended by PT    Recommendations for Other Services     Frequency Min 3X/week    Precautions / Restrictions Restrictions Weight Bearing Restrictions: No   Pertinent Vitals/Pain No c/o pain, but pt reports gen weakness due to not eating      Mobility  Bed Mobility Bed Mobility: Rolling Right;Rolling Left;Supine to Sit;Sit to Supine Rolling Right: 7: Independent Rolling Left: 7: Independent Supine to Sit: 7: Independent Sit to Supine: 7: Independent Transfers Transfers: Sit to Stand;Stand to Sit Sit to Stand: 7: Independent Stand to Sit: 7: Independent Ambulation/Gait Ambulation/Gait Assistance: 4: Min assist (one episod of balance loss) Ambulation Distance (Feet): 150 Feet Assistive device: 1 person hand held assist Ambulation/Gait Assistance Details: pt endorses generalized weakness as she hasn't eaten in several days Gait Pattern: Within Functional Limits Gait velocity: [facility] Gait Details: pt with some ataxia in gait, especially with head turns and distractions.  Stairs: No Wheelchair Mobility Wheelchair Mobility: No    Exercises Other  Exercises Other Exercises: pt issued handout for home exercises including abd sets, glute sets, core activation ,straight leg raise, sit to stand and single leg balance. Pt acknowledges she will be able to do them at home   PT Diagnosis: Difficulty walking;Abnormality of gait;Generalized weakness  PT Problem List: Decreased activity tolerance;Decreased balance PT Treatment Interventions: Gait training;Stair training;Functional mobility training;Therapeutic exercise   PT Goals Acute Rehab PT Goals PT Goal Formulation: With patient Time For Goal Achievement: 05/20/12 Potential to Achieve Goals: Good Pt will Ambulate: >150 feet;Independently PT Goal: Ambulate - Progress: Goal set today Pt will Go Up / Down Stairs: 3-5 stairs;with rail(s);with modified independence PT Goal: Up/Down Stairs - Progress: Goal set today  Visit Information  Last PT Received On: 05/13/12    Subjective Data  Subjective: i havent' eaten since Sunday Patient Stated Goal: to return home with daughter   Prior Functioning  Home Living Lives With: Alone Available Help at Discharge: Family Type of Home: House Home Access: Stairs to enter Secretary/administrator of Steps: 3 Entrance Stairs-Rails: Right;Left Home Layout: Able to live on main level with bedroom/bathroom Home Adaptive Equipment: None Prior Function Level of Independence: Independent Able to Take Stairs?: Yes Driving: Yes Communication Communication: No difficulties    Cognition  Cognition Overall Cognitive Status: Appears within functional limits for tasks assessed/performed Arousal/Alertness: Awake/alert Orientation Level: Appears intact for tasks assessed Behavior During Session: [facility] for tasks performed (pt reports she is "nervous") Cognition - Other Comments: pt needed frequent redirection to task. she tends to talk at length off topic    Extremity/Trunk Assessment Right Lower Extremity  Assessment RLE ROM/Strength/Tone: Within functional  levels RLE Sensation: WFL - Light Touch;WFL - Proprioception Left Lower Extremity Assessment LLE ROM/Strength/Tone: Within functional levels LLE Sensation: WFL - Light Touch;WFL - Proprioception Trunk Assessment Trunk Assessment: Normal   Balance Balance Balance Assessed: Yes Static Sitting Balance Static Sitting - Balance Support: No upper extremity supported Static Sitting - Level of Assistance: 7: Independent Static Standing Balance Static Standing - Balance Support: No upper extremity supported Static Standing - Level of Assistance: 7: Independent Standardized Balance Assessment Standardized Balance Assessment: Berg Balance Test Berg Balance Test Sit to Stand: Able to stand without using hands and stabilize independently Standing Unsupported: Able to stand safely 2 minutes Sitting with Back Unsupported but Feet Supported on Floor or Stool: Able to sit safely and securely 2 minutes Stand to Sit: Sits safely with minimal use of hands Transfers: Able to transfer safely, minor use of hands Standing Unsupported with Eyes Closed: Able to stand 10 seconds safely Standing Ubsupported with Feet Together: Able to place feet together independently and stand 1 minute safely From Standing, Reach Forward with Outstretched Arm: Can reach confidently >25 cm (10") From Standing Position, Pick up Object from Floor: Able to pick up shoe safely and easily From Standing Position, Turn to Look Behind Over each Shoulder: Looks behind from both sides and weight shifts well Turn 360 Degrees: Able to turn 360 degrees safely in 4 seconds or less Standing Unsupported, Alternately Place Feet on Step/Stool: Able to complete >2 steps/needs minimal assist Standing Unsupported, One Foot in Front: Able to plae foot ahead of the other independently and hold 30 seconds Standing on One Leg: Able to lift leg independently and hold equal to or more than 3 seconds Total Score: 50  End of Session PT - End of  Session Activity Tolerance: Patient limited by fatigue Patient left: in bed  GP Functional Assessment Tool Used: Berg Balance Assessment 50/56 Functional Limitation: Mobility: Walking and moving around Mobility: Walking and Moving Around Current Status ([id number]): At least 1 percent but less than 20 percent impaired, limited or restricted Mobility: Walking and Moving Around Goal Status [phone]): 0 percent impaired, limited or restricted  Rosey Bath K. Beatty, [place] [phone] 05/13/2012, 4:36 PM

## 2012-05-13 NOTE — Telephone Encounter (Signed)
Please schedule ENT referral as recommended by GI

## 2012-05-13 NOTE — Progress Notes (Signed)
[facility] PROGRESS NOTE  Savannah Diaz AVW:[id number] DOB: 13-Aug-1932 DOA: 05/12/2012 PCP: Rogelia Boga, MD  Brief narrative 79 year old female patient with history of HTN, anxiety, depression, GERD was admitted on 05/12/12 with complaints of generalized weakness. She has been recently evaluated by [facility] for dysphagia. EGD did not show any obstructing lesion. Barium swallow on 05/12/12 showed recurrent aspiration of contrast due to abnormal motion of the epiglottis. Patient was supposed to see ENT as OP. In the ED, she was clinically dehydrated, hypokalemia and mild leukocytosis.   Assessment/Plan: 1. Dysphagia: Features of overt aspiration on recent barium swallow. ST consulted who recommended MBSS. [facility] consulted who advised ENT consultation (called Dr. Narda Bonds) and MRI brain to rule out neurological causes for her presentation. 2. Hypokalemia: IV replete and follow BMP in a.m. Magnesium: 1.8 3. Dehydration with mild hypernatremia: Secondary to poor oral intake. Continue IV fluids (D5W) for additional 24 hours. 4. Hypertension: Continue IV metoprolol. Reasonable inpatient control. 5. Anxiety: Change Valium to IV Ativan when necessary. 6. Abnormal chest x-ray: Findings related to COPD. T9 compression deformity of indeterminate age. Possible basilar bronchiectasis but findings can be seen with chronic recurring aspiration. No evidence of consolidation. Patient denies dyspnea at this time. Afebrile and no leukocytosis. Outpatient followup and may consider CT chest at that time.  Code Status: Full Family Communication: Discussed with patient. Disposition Plan: Home when medically stable.   Consultants:  [facility]  Speech therapy  ENT  Procedures:  MBSS   Antibiotics:  None   HPI/Subjective: Patient indicates that she feels a whole lot better. States that her swallowing and tone of voice was much better this a.m. Says that she was able to swallow  better during testing-MBSS. Per ST, evidence of aspiration but did better with thin liquids.  Objective: Filed Vitals:   05/12/12 1700 05/12/12 2038 05/13/12 0531 05/13/12 0927  BP: 170/87 112/91 125/68 127/76  Pulse: 98 97 102 100  Temp: 98.2 F (36.8 C) 98.4 F (36.9 C) 98.6 F (37 C) 98 F (36.7 C)  TempSrc: Oral Oral Oral Oral  Resp: 16 16 16 16   Height: 5\' 1"  (1.549 m)     Weight: 65.1 kg (143 lb 8.3 oz)     SpO2: 100% 99% 98% 100%   No intake or output data in the 24 hours ending 05/13/12 1115 Filed Weights   05/12/12 1700  Weight: 65.1 kg (143 lb 8.3 oz)    Exam:   General exam: Comfortable.  Respiratory system: Occasional basal crackles but otherwise clear to auscultation. No increased work of breathing.  Cardiovascular system: S1 & S2 heard, RRR. No JVD, murmurs, gallops, clicks or pedal edema.  Gastrointestinal system: Abdomen is nondistended, soft and nontender. Normal bowel sounds heard.  Central nervous system: Alert and oriented. No focal neurological deficits.  Extremities: Symmetric 5 x 5 power.   Data Reviewed: Basic Metabolic Panel:  Recent Labs Lab 05/12/12 1459 05/12/12 1749 05/13/12 0505  NA 140  --  146*  K 3.1*  --  3.1*  CL 98  --  109  CO2 23  --  26  GLUCOSE 93  --  132*  BUN 20  --  14  CREATININE 1.09 1.02 0.86  CALCIUM 9.7  --  8.5  MG  --   --  1.8   Liver Function Tests:  Recent Labs Lab 05/13/12 0505  AST 20  ALT 26  ALKPHOS 73  BILITOT 1.0  PROT 6.1  ALBUMIN 3.2*  No results found for this basename: LIPASE, AMYLASE,  in the last 168 hours No results found for this basename: AMMONIA,  in the last 168 hours CBC:  Recent Labs Lab 05/12/12 1459 05/12/12 1749 05/13/12 0505  WBC 10.6* 10.0 8.5  NEUTROABS 7.9*  --   --   HGB 14.6 13.7 12.2  HCT 42.5 39.7 35.9*  MCV 88.4 88.8 89.1  PLT 393 386 339   Cardiac Enzymes: No results found for this basename: CKTOTAL, CKMB, CKMBINDEX, TROPONINI,  in the last 168  hours BNP (last 3 results) No results found for this basename: PROBNP,  in the last 8760 hours CBG:  Recent Labs Lab 05/13/12 0817  GLUCAP 116*    No results found for this or any previous visit (from the past 240 hour(s)).   Studies: Dg Chest 2 View  05/12/2012  *RADIOLOGY REPORT*  Clinical Data: Esophageal narrowing.  Rule out aspiration.  CHEST - 2 VIEW  Comparison: None.  Findings: Lungs are markedly hyperaerated.  Mild bronchitic changes and interstitial prominence.  No consolidation or mass.  No pneumothorax or pleural effusion.  Minimal subsegmental atelectasis at the left base.  Minimal T9 compression fracture with 20% loss of height anteriorly and depression of the superior endplate is of indeterminate age. Streaky opacities at the medial lung bases may represent bronchiectasis.  IMPRESSION: Changes related to COPD.  T9 compression deformity of indeterminate age.  No evidence of consolidation.  Possible basilar bronchiectasis.  This can be seen with chronic recurring aspiration.  CT can be performed to further characterize.   Original Report Authenticated By: Jolaine Click, M.D.    Dg Esophagus  05/12/2012  *RADIOLOGY REPORT*  Clinical Data: Dysphagia.  ESOPHOGRAM/BARIUM SWALLOW  Technique:  Single contrast examination was performed using thin barium.  Fluoroscopy time:  1 minute 39 seconds.  Comparison:  None.  Findings:  The patient has aspiration of the thin barium with each swallow. There is  very limited deflection of the epiglottis with swallowing.  There is marked retention of barium in the valleculae and piriform sinuses.  The patient also swallows a large amount of air that distends the valleculae and piriform sinuses.  There is spillover of secretions into the trachea from the piriform sinuses.  There is suboptimal visualization of the remainder of the esophagus but there is no obstruction.  No mass lesion.  IMPRESSION: Recurrent aspiration of contrast with each swallow due to  abnormal motion of the epiglottis.  Retention of secretions and barium in the valleculae and piriform sinuses which does not clear with repeated swallowing.  Spillover from the piriform sinuses into the trachea.  The esophagus appears normal.  I gave the patient percussion and postural drainage at the completion of the procedure to remove as much of the retained secretions and barium from the piriform sinuses and from the airway as possible.  Savannah Diaz tolerated the procedure well.   Original Report Authenticated By: Francene Boyers, M.D.      Additional labs:   Scheduled Meds: . antiseptic oral rinse  15 mL Mouth Rinse BID  . enoxaparin (LOVENOX) injection  40 mg Subcutaneous Q24H  . metoprolol  5 mg Intravenous Q8H   Continuous Infusions: . dextrose 5 % and 0.9% NaCl 75 mL/hr at 05/12/12 2029    Active Problems:   Hypertension   Dysphagia, unspecified   Hypokalemia    Time spent: 40 minutes    [facility]  [facility] Pager [phone].   If 8PM-8AM, please contact night-coverage at  [email or web address], password [facility] 05/13/2012, 11:15 AM  LOS: 1 day

## 2012-05-13 NOTE — ED Provider Notes (Signed)
Medical screening examination/treatment/procedure(s) were performed by non-physician practitioner and as supervising physician I was immediately available for consultation/collaboration.  Raeford Razor, MD 05/13/12 [phone]

## 2012-05-14 ENCOUNTER — Other Ambulatory Visit (HOSPITAL_COMMUNITY): Payer: Medicare Other

## 2012-05-14 DIAGNOSIS — E860 Dehydration: Secondary | ICD-10-CM | POA: Diagnosis not present

## 2012-05-14 DIAGNOSIS — I10 Essential (primary) hypertension: Secondary | ICD-10-CM | POA: Diagnosis not present

## 2012-05-14 DIAGNOSIS — E876 Hypokalemia: Secondary | ICD-10-CM | POA: Diagnosis not present

## 2012-05-14 DIAGNOSIS — R1310 Dysphagia, unspecified: Secondary | ICD-10-CM | POA: Diagnosis not present

## 2012-05-14 LAB — BASIC METABOLIC PANEL
CO2: 24 mEq/L (ref 19–32)
Calcium: 8.3 mg/dL — ABNORMAL LOW (ref 8.4–10.5)
Creatinine, Ser: 0.77 mg/dL (ref 0.50–1.10)
Glucose, Bld: 112 mg/dL — ABNORMAL HIGH (ref 70–99)

## 2012-05-14 LAB — URINALYSIS, ROUTINE W REFLEX MICROSCOPIC
Bilirubin Urine: NEGATIVE
Glucose, UA: NEGATIVE mg/dL
Ketones, ur: NEGATIVE mg/dL
Protein, ur: NEGATIVE mg/dL

## 2012-05-14 LAB — URINE MICROSCOPIC-ADD ON

## 2012-05-14 MED ORDER — CALCIUM CARBONATE ANTACID 500 MG PO CHEW
1.0000 | CHEWABLE_TABLET | Freq: Three times a day (TID) | ORAL | Status: DC
  Filled 2012-05-14 (×5): qty 1

## 2012-05-14 MED ORDER — ATENOLOL 50 MG PO TABS
75.0000 mg | ORAL_TABLET | Freq: Every day | ORAL | Status: DC
  Administered 2012-05-14 – 2012-05-15 (×2): 75 mg via ORAL
  Filled 2012-05-14 (×2): qty 1

## 2012-05-14 MED ORDER — ALUM & MAG HYDROXIDE-SIMETH 200-200-20 MG/5ML PO SUSP
15.0000 mL | ORAL | Status: DC | PRN
Start: 2012-05-14 — End: 2012-05-15
  Filled 2012-05-14 (×2): qty 30

## 2012-05-14 MED ORDER — DIAZEPAM 5 MG PO TABS: 5.0000 mg | ORAL_TABLET | Freq: Three times a day (TID) | ORAL | Status: DC | PRN

## 2012-05-14 MED ORDER — ENSURE COMPLETE PO LIQD
237.0000 mL | Freq: Three times a day (TID) | ORAL | Status: DC
  Administered 2012-05-14: 237 mL via ORAL

## 2012-05-14 MED ORDER — AMITRIPTYLINE HCL 75 MG PO TABS
225.0000 mg | ORAL_TABLET | Freq: Every day | ORAL | Status: DC
  Administered 2012-05-14: 225 mg via ORAL
  Filled 2012-05-14 (×2): qty 3

## 2012-05-14 NOTE — Progress Notes (Signed)
Gave patient a copy of appointment information at [facility] per PA's request. Pt verbalized understanding. [name], Rosanna Randy

## 2012-05-14 NOTE — Progress Notes (Signed)
Speech Language Pathology Dysphagia Treatment Patient Details Name: Savannah Diaz MRN: [id number] DOB: December 13, 1932 Today's Date: 05/14/2012 Time: 7829-5621 SLP Time Calculation (min): 25 min  Assessment / Plan / Recommendation Clinical Impression  Observation with po's and education regarding swallow function with pt. and daughter today.  Pt.'s vocal quality is clear and voice sounds normal (versus muffled).  Reviewed MBS results with pt. and daughter with rationale for diet modifications and compensatory strategies.  Educated on diet texture if pt. is discharged over the weekend and how to initiate home health ST if dysphagia continues after next several weeks.  She required moderate verbal and visual cues for proper sip size coupled with chin tuck.  Pt. was consuming large boluses and swallowing small amounts of the bolus seperately resulting in watery eyes and slightly increased respiratory pattern.  SLP demonstrated taking one small sip with chin tuck which she was able to demonstrate.  SLP offered to observe her with a higher texture of food, however she declined saying her tummy was upset.  SLP will continue to follow while in hospital.       Diet Recommendation  Continue with Current Diet: Dysphagia 1 (puree);Thin liquid    SLP Plan Continue with current plan of care   Pertinent Vitals/Pain none   Swallowing Goals  SLP Swallowing Goals Patient will utilize recommended strategies during swallow to increase swallowing safety with: Modified independent assistance Swallow Study Goal #2 - Progress: Progressing toward goal  General Temperature Spikes Noted: No Respiratory Status: Room air Behavior/Cognition: Alert;Cooperative;Pleasant mood Oral Cavity - Dentition: Adequate natural dentition Patient Positioning: Upright in bed  Oral Cavity - Oral Hygiene Does patient have any of the following "at risk" factors?: None of the above Brush patient's teeth BID with toothbrush (using toothpaste  with fluoride): Yes   Dysphagia Treatment Treatment focused on: Skilled observation of diet tolerance;Patient/family/caregiver education;Utilization of compensatory strategies Family/Caregiver Educated: daughter Treatment Methods/Modalities: Skilled observation Patient observed directly with PO's: Yes Type of PO's observed: Thin liquids Feeding: Able to feed self Liquids provided via: Cup;No straw Pharyngeal Phase Signs & Symptoms: Watery eyes;Changes in respirations Type of cueing: Verbal;Visual Amount of cueing: Moderate        Breck Coons [place].Ed [employer] [phone]  05/14/2012

## 2012-05-14 NOTE — Progress Notes (Signed)
[facility] PROGRESS NOTE  Savannah Diaz ZOX:[id number] DOB: [birth year] DOA: 05/12/2012 PCP: Rogelia Boga, MD  Brief narrative 79 year old female patient with history of HTN, anxiety, depression, GERD was admitted on 05/12/12 with complaints of generalized weakness. She has been recently evaluated by [facility] for dysphagia. EGD did not show any obstructing lesion. Barium swallow on 05/12/12 showed recurrent aspiration of contrast due to abnormal motion of the epiglottis. Patient was supposed to see ENT as OP. In the ED, she was clinically dehydrated, hypokalemia and mild leukocytosis.   Assessment/Plan: 1. Dysphagia: Features of overt aspiration on recent barium swallow. MBSS performed by ST who recommended dysphagia 1 diet with thin liquids. Patient seems to have tolerated this for dinner last night and breakfast this morning. [facility] consulted and recommended MRI head (no acute infarct) and ENT consultation (Dr. Ezzard Standing has seen 3/21 and performed laryngoscopy at bedside-note pending). GI recommended neurology consultation to look for neurological causes. 2. Hypokalemia: Repleted. Magnesium: 1.8 3. Dehydration with mild hypernatremia: Secondary to poor oral intake. Resolved after IV fluids. DC IV fluids. 4. Hypertension: Reasonable inpatient control. Change metoprolol to by mouth. 5. Anxiety: Change Ativan to Valium. 6. Abnormal chest x-ray: Findings related to COPD. T9 compression deformity of indeterminate age. Possible basilar bronchiectasis but findings can be seen with chronic recurring aspiration. No evidence of consolidation. Patient denies dyspnea at this time. Afebrile and no leukocytosis. Outpatient followup and may consider CT chest at that time.  Code Status: Full Family Communication: Discussed with patient and with patient's daughter Ms. Ledell Noss via phone. Disposition Plan: Home when medically stable pending GI clearance.   Consultants:  [facility]  Speech therapy  ENT  Neurology  Procedures:  MBSS   Bedside laryngoscopy by ENT  Antibiotics:  None   HPI/Subjective: Patient indicates that this is the best she has felt in a while. Tolerated specialized diet for dinner and breakfast. 2 episodes of diarrhea. Intermittent nonproductive cough.  Objective: Filed Vitals:   05/13/12 1436 05/13/12 2150 05/14/12 0531 05/14/12 0922  BP: 148/94 148/84 131/69 135/80  Pulse: 98 109 101 98  Temp: 98.5 F (36.9 C) 98.7 F (37.1 C) 98.1 F (36.7 C) 98.1 F (36.7 C)  TempSrc: Oral Oral Oral Oral  Resp: 16 16 16 16   Height:      Weight:  65.5 kg (144 lb 6.4 oz)    SpO2: 100% 100% 100% 100%   No intake or output data in the 24 hours ending 05/14/12 1443 Filed Weights   05/12/12 1700 05/13/12 2150  Weight: 65.1 kg (143 lb 8.3 oz) 65.5 kg (144 lb 6.4 oz)    Exam:   General exam: Comfortable.  Respiratory system: Occasional basal crackles but otherwise clear to auscultation. No increased work of breathing.  Cardiovascular system: S1 & S2 heard, RRR. No JVD, murmurs, gallops, clicks or pedal edema. Telemetry shows normal sinus rhythm.  Gastrointestinal system: Abdomen is nondistended, soft and nontender. Normal bowel sounds heard.  Central nervous system: Alert and oriented. No focal neurological deficits.  Extremities: Symmetric 5 x 5 power.   Data Reviewed: Basic Metabolic Panel:  Recent Labs Lab 05/12/12 1459 05/12/12 1749 05/13/12 0505 05/14/12 0445  NA 140  --  146* 140  K 3.1*  --  3.1* 3.6  CL 98  --  109 106  CO2 23  --  26 24  GLUCOSE 93  --  132* 112*  BUN 20  --  14 7  CREATININE 1.09 1.02 0.86 0.77  CALCIUM 9.7  --  8.5 8.3*  MG  --   --  1.8  --    Liver Function Tests:  Recent Labs Lab 05/13/12 0505  AST 20  ALT 26  ALKPHOS 73  BILITOT 1.0  PROT 6.1  ALBUMIN 3.2*   No results found for this basename: LIPASE, AMYLASE,  in the last 168 hours No results found for this basename:  AMMONIA,  in the last 168 hours CBC:  Recent Labs Lab 05/12/12 1459 05/12/12 1749 05/13/12 0505  WBC 10.6* 10.0 8.5  NEUTROABS 7.9*  --   --   HGB 14.6 13.7 12.2  HCT 42.5 39.7 35.9*  MCV 88.4 88.8 89.1  PLT 393 386 339   Cardiac Enzymes: No results found for this basename: CKTOTAL, CKMB, CKMBINDEX, TROPONINI,  in the last 168 hours BNP (last 3 results) No results found for this basename: PROBNP,  in the last 8760 hours CBG:  Recent Labs Lab 05/13/12 0817 05/14/12 0756  GLUCAP 116* 111*    No results found for this or any previous visit (from the past 240 hour(s)).   Studies: Mr Brain Wo Contrast  05/14/2012  *RADIOLOGY REPORT*  Clinical Data: Dysphagia.  CVA.  MRI HEAD WITHOUT CONTRAST  Technique:  Multiplanar, multiecho pulse sequences of the brain and surrounding structures were obtained according to standard protocol without intravenous contrast.  Comparison: None  Findings: Negative for acute infarct.  Chronic microvascular ischemic changes in the white matter bilaterally.  Brainstem and cerebellum are intact.  Negative for mass lesion.  Negative for intracranial hemorrhage. No midline shift.  Ventricle size is normal.  Small mastoid sinus effusion on the left.  IMPRESSION: Chronic microvascular ischemia in the white matter.  No acute infarct.   Original Report Authenticated By: Janeece Riggers, M.D.    Dg Swallowing Func-speech Pathology  05/13/2012  Savannah Coons [place], CCC-SLP     05/13/2012  3:16 PM Objective Swallowing Evaluation: Modified Barium Swallowing Study   Patient Details  Name: Savannah Diaz MRN: [id number] Date of Birth: [birth year]  Today's Date: 05/13/2012 Time: 1005-1030 SLP Time Calculation (min): 25 min  Past Medical History:  Past Medical History  Diagnosis Date  . Hypertension   . Anxiety   . Cataract     left  . Depression   . GERD (gastroesophageal reflux disease)   . Osteoporosis   . Dysphagia     "have had it for 7 yr; got worse 4 days ago" (05/12/2012)  .  Migraines     "menopause cured them" (05/12/2012)  . Arthritis     "qwhere; but it doesn't hurt" (05/12/2012)   Past Surgical History:  Past Surgical History  Procedure Laterality Date  . Cataract extraction w/ intraocular lens implant Left ? 2010  . Esophagogastroduodenoscopy N/A 05/10/2012    Procedure: ESOPHAGOGASTRODUODENOSCOPY (EGD);  Surgeon: Beverley Fiedler, MD;  Location: [facility];  Service: Gastroenterology;   Laterality: N/A;  . Tonsillectomy  1940's   HPI:  Savannah Diaz is a 79 y.o. female with h/o hypertension, recently  was seen by Dr Rhea Belton for dysphagia and underwent EGD on 05/10/12  for dysphagia, hasn't been able to take water or food since  Sunday. Pt. reports Sunday a pill got lodged in her throat and  she has been expectorating mucous with wet vocal qualtiy and  coughing on her secretions since that time.   Pt. also reports  difficulty swallowing for the past 7 years (esophageal) which she  has compensated for. Pt.  had EGD 05/10/12 revealing abnormal  mucousa on upper 1/3 of esophagus with question of pill induced  injury and smal hiatal hernia. Barium esophagram 05/12/12 showed  frequent aspiration with thin barium and significant pharyngeal  stasis.  She reports she needs a repeat EGD with dilatation of  the esophagus which was not done during initial procedure due to  esophageal trauma as reported by the pt.     Assessment / Plan / Recommendation Clinical Impression  Dysphagia Diagnosis: Moderate pharyngeal phase  dysphagia;Moderate cervical esophageal phase dysphagia;Severe  cervical esophageal phase dysphagia Clinical impression: Oral phase of MBS appeared within functional  limits.  Pt. exhibited a moderate pharyngeal and moderate-severe  cervical esophageal phase dysphagia with decreased laryngeal  elevation resulting in maximum amount of pyriform sinus residue.   Significantly reduced UES opening as a result of poor laryngeal  elevation as well as decreased epiglotic inversion leading to   aspiration during the swallow of nectar thick barium and during  multiple swallows in attempts to clear pyriform sinus residue.   She exhibited significant difficulty initiating multiple  volitional swallows in attempts to clear residue.  Thin barium  appeared safer with only one episode of flash penetration due to  increased bolus weight and volume to assist in UES opening as  well as significantly decreased pyriform sinus residue.   Pharyngeal phase also characterized by decreased pharyngeal  contraction causing mild posterior pharyngeal wall residue  intermittently.  Pharygneal strength appeared to increase as  study progressed.  Pt. spontaneously performed a chin tuck with  majority of trials stating "this helps me."  Esophagus was  scanned which revealed barium descending then ascending mid  esophagus throughout study.  Etiology may be possible  neurological involvement as swallow function appears to be  indicative of  versus primary esophageal impairments.  SLP  recommends Dys 1 diet texture (due to increased cohesiveness of  puree and will advance as pt. able) and thin liquids avoiding  straws, multiple dry swallows, alternate solids/liquids sit  upright, stay upright 1 hour after meals and crush pills.   Recommendations discussed with pt. and MD (discussed possibility  of neuro consult) and continued ST for safety with diet.    Treatment Recommendation  Therapy as outlined in treatment plan below    Diet Recommendation Dysphagia 1 (Puree);Thin liquid   Liquid Administration via: Cup;No straw Medication Administration: Crushed with puree Supervision: Patient able to self feed;Intermittent supervision  to cue for compensatory strategies Compensations: Slow rate;Small sips/bites;Multiple dry swallows  after each bite/sip;Follow solids with liquid Postural Changes and/or Swallow Maneuvers: Seated upright 90  degrees;Upright 30-60 min after meal;Chin tuck    Other  Recommendations Oral Care Recommendations: Oral  care BID   Follow Up Recommendations   (to be determined)    Frequency and Duration min 2x/week  2 weeks   Pertinent Vitals/Pain     SLP Swallow Goals Patient will utilize recommended strategies during swallow to  increase swallowing safety with: Modified independent assistance      Reason for Referral Objectively evaluate swallowing function   Oral Phase Oral Preparation/Oral Phase Oral Phase: WFL   Pharyngeal Phase Pharyngeal Phase Pharyngeal Phase: Impaired Pharyngeal - Nectar Pharyngeal - Nectar Teaspoon: Reduced pharyngeal  peristalsis;Reduced epiglottic inversion;Penetration/Aspiration  during swallow;Moderate aspiration;Reduced laryngeal  elevation;Pharyngeal residue - pyriform sinuses;Pharyngeal  residue - valleculae;Reduced anterior laryngeal mobility;Reduced  airway/laryngeal closure (Pt. performed spontaneous chin tuck,  max residue pyriform si) Penetration/Aspiration details (nectar teaspoon): Material enters  airway, passes BELOW cords and  not ejected out despite cough  attempt by patient;Material enters airway, remains ABOVE vocal  cords and not ejected out Pharyngeal - Nectar Cup: Reduced pharyngeal peristalsis;Reduced  epiglottic inversion;Reduced anterior laryngeal mobility;Reduced  laryngeal elevation;Reduced airway/laryngeal  closure;Penetration/Aspiration during swallow;Pharyngeal residue  - pyriform sinuses;Pharyngeal residue - valleculae Penetration/Aspiration details (nectar cup): Material enters  airway, CONTACTS cords and not ejected out;Material enters  airway, passes BELOW cords without attempt by patient to eject  out (silent aspiration) Pharyngeal - Thin Pharyngeal - Thin Teaspoon: Pharyngeal residue - pyriform  sinuses;Reduced laryngeal elevation Pharyngeal - Thin Cup: Penetration/Aspiration during  swallow;Reduced anterior laryngeal mobility;Reduced epiglottic  inversion;Reduced airway/laryngeal closure;Reduced laryngeal  elevation;Pharyngeal residue - pyriform sinuses;Pharyngeal   residue - valleculae;Reduced tongue base retraction;Reduced  pharyngeal peristalsis Penetration/Aspiration details (thin cup): Material enters  airway, remains ABOVE vocal cords then ejected out Pharyngeal - Solids Pharyngeal - Puree: Reduced pharyngeal peristalsis;Pharyngeal  residue - valleculae;Pharyngeal residue - pyriform  sinuses;Reduced anterior laryngeal mobility;Reduced laryngeal  elevation;Reduced tongue base retraction Pharyngeal - Mechanical Soft: Pharyngeal residue - posterior  pharnyx;Reduced laryngeal elevation  Cervical Esophageal Phase        Cervical Esophageal Phase Cervical Esophageal Phase: Impaired (Decreased UES opening)         Savannah Diaz M.Ed CCC-SLP Pager [phone]  05/13/2012       Additional labs:   Scheduled Meds: . antiseptic oral rinse  15 mL Mouth Rinse BID  . enoxaparin (LOVENOX) injection  40 mg Subcutaneous Q24H  . metoprolol  5 mg Intravenous Q8H   Continuous Infusions: . dextrose 5 % with KCl 20 mEq / L 20 mEq (05/14/12 1307)    Active Problems:   Hypertension   Dysphagia, unspecified   Hypokalemia   Dehydration with hypernatremia    Time spent: 30 minutes    [facility]  [facility] Pager [phone].   If 8PM-8AM, please contact night-coverage at [email or web address], password [facility] 05/14/2012, 2:43 PM  LOS: 2 days

## 2012-05-14 NOTE — Progress Notes (Signed)
I have made a appointment for patient with [facility] for F/U and EMG on April 4th at 09:30 AM    [facility] [address] [place], [place] [address]--[phone]

## 2012-05-14 NOTE — Progress Notes (Signed)
Physical Therapy Treatment Patient Details Name: Savannah Diaz MRN: [id number] DOB: 24-Sep-1932 Today's Date: 05/14/2012 Time: 7829-5621 PT Time Calculation (min): 19 min  PT Assessment / Plan / Recommendation Comments on Treatment Session  Patient with improved ambulation/balance today.    Follow Up Recommendations  No PT follow up;Supervision - Intermittent     Does the patient have the potential to tolerate intense rehabilitation     Barriers to Discharge        Equipment Recommendations  None recommended by PT    Recommendations for Other Services    Frequency Min 3X/week   Plan Discharge plan remains appropriate;Frequency remains appropriate    Precautions / Restrictions Restrictions Weight Bearing Restrictions: No   Pertinent Vitals/Pain     Mobility  Bed Mobility Bed Mobility: Supine to Sit;Sit to Supine Supine to Sit: 7: Independent Sit to Supine: 7: Independent Details for Bed Mobility Assistance: No cues or assist needed Transfers Transfers: Sit to Stand;Stand to Sit Sit to Stand: 7: Independent Stand to Sit: 7: Independent Details for Transfer Assistance: No cues or assist needed.  Good technique Ambulation/Gait Ambulation/Gait Assistance: 4: Min guard Ambulation Distance (Feet): 320 Feet Assistive device: None Ambulation/Gait Assistance Details: Patient ambulates without assistive device.  Assist only for safety/balance.  Patient with slightly staggering gait at times.   Gait Pattern: Within Functional Limits Gait velocity: WFL      PT Goals Acute Rehab PT Goals PT Goal: Ambulate - Progress: Progressing toward goal  Visit Information  Last PT Received On: 05/14/12 Assistance Needed: +1    Subjective Data  Subjective: "I'm still quite weak."   Cognition  Cognition Overall Cognitive Status: Appears within functional limits for tasks assessed/performed Arousal/Alertness: Awake/alert Orientation Level: Appears intact for tasks assessed Behavior  During Session: [facility] for tasks performed    Balance     End of Session PT - End of Session Activity Tolerance: Patient tolerated treatment well Patient left: in bed;with call bell/phone within reach Nurse Communication: Mobility status   GP Functional Assessment Tool Used: clinical judgement Functional Limitation: Mobility: Walking and moving around Mobility: Walking and Moving Around Current Status (H0865): At least 1 percent but less than 20 percent impaired, limited or restricted Mobility: Walking and Moving Around Goal Status [phone]): 0 percent impaired, limited or restricted   Vena Austria 05/14/2012, 12:23 PM Durenda Hurt. Renaldo Fiddler, [facility] Acute Rehab Services Pager [phone]

## 2012-05-14 NOTE — Progress Notes (Signed)
Patient seen, examined, and I agree with the above documentation, including the assessment and plan.  

## 2012-05-14 NOTE — ED Provider Notes (Signed)
Medical screening examination/treatment/procedure(s) were conducted as a shared visit with non-physician practitioner(s) and myself.  I personally evaluated the patient during the encounter  Worsening dysphagia for the past several days. Recent endoscopy and swallow study. EGD did not show any obstructing lesion. Barium swallow on 05/12/12 showed recurrent aspiration of contrast due to abnormal motion of the epiglottis Controlling secretions, no distress.  Glynn Octave, MD 05/14/12 [phone]

## 2012-05-14 NOTE — Progress Notes (Signed)
Patient ID: Savannah Diaz, female   DOB: [birth year], 79 y.o.   MRN: [id number] [facility] Progress Note  Subjective: Feels better today, voice clearer,less wet, but choked on sip of sprite  While I was in the room -says she is trying to eat but lunch tray appears untouched except for 1/3 cup of water... ENT and Neuro have seen today- notes pending.  Objective:  Vital signs in last 24 hours: Temp:  [98.1 F (36.7 C)-98.7 F (37.1 C)] 98.4 F (36.9 C) (03/21 1457) Pulse Rate:  [98-109] 102 (03/21 1457) Resp:  [16-18] 18 (03/21 1457) BP: (131-148)/(69-84) 142/80 mmHg (03/21 1457) SpO2:  [100 %] 100 % (03/21 1457) Weight:  [144 lb 6.4 oz (65.5 kg)] 144 lb 6.4 oz (65.5 kg) (03/20 2150) Last BM Date: 05/14/12 General:   Alert,  Well-developed, elderly thin WF   in NAD Heart:  Regular rate and rhythm; no murmurs Pulm;clear Abdomen:  Soft, nontender and nondistended. Normal bowel sounds, without guarding, and without rebound.   Extremities:  Without edema. Neurologic:  Alert and  oriented x4;  grossly normal neurologically. Psych:  Alert and cooperative. Normal mood and affect.  Intake/Output from previous day:   Intake/Output this shift:    Lab Results:  Recent Labs  05/12/12 1459 05/12/12 1749 05/13/12 0505  WBC 10.6* 10.0 8.5  HGB 14.6 13.7 12.2  HCT 42.5 39.7 35.9*  PLT 393 386 339   BMET  Recent Labs  05/12/12 1459 05/12/12 1749 05/13/12 0505 05/14/12 0445  NA 140  --  146* 140  K 3.1*  --  3.1* 3.6  CL 98  --  109 106  CO2 23  --  26 24  GLUCOSE 93  --  132* 112*  BUN 20  --  14 7  CREATININE 1.09 1.02 0.86 0.77  CALCIUM 9.7  --  8.5 8.3*   LFT  Recent Labs  05/13/12 0505  PROT 6.1  ALBUMIN 3.2*  AST 20  ALT 26  ALKPHOS 73  BILITOT 1.0     Assessment / Plan: #1 79 yo female  With acute on chronic dysphagia with distinct episode with aspiration earlier this week She does not have an esophageal srticture, or sign of motility issue by  barium swallow. Problem is at epiglottic issue- with extensive pooling in pyriform sinuses, and valleculea lNeuro is planning further  outpt evaluation ,and Myasthenia panel which will not return for a couple weeks I do not think she can go home until she proves she can take in calories Will add Ensure TID between meals, calorie counts    LOS: 2 days   Amy Esterwood  05/14/2012, 3:35 PM

## 2012-05-14 NOTE — Progress Notes (Signed)
Patient ID: Savannah Diaz, female   DOB: 02-07-1933, 79 y.o.   MRN: [id number]   MRI shows no acute event ,chronic microvascular  Ischemic changes.   She needs ENT and  Neuro  Consults Diet as suggested per speech path.

## 2012-05-14 NOTE — Consult Note (Addendum)
NEURO HOSPITALIST CONSULT NOTE    Reason for Consult: dysphagia x6-7 years with acute worsening  HPI:                                                                                                                                          Savannah Diaz is an 79 y.o. female who has been seen by GI 05/10/12 for acute onset dysphagia but actually has had symptoms for the last 6-7 years. Per very detailed note from GI on 05/09/12 she had sensation feeling that one of her pills had gotten stuck she started having coughing and a choking feeling and then started producing a lot of phlegm in head been unable to swallow since. "When seen in the office she was still coughing some and had a very wet voice quality good increased coughing when she tried to talk. " she was set up for a urgent EGD which showed "no evidence of any pill impaction in the esophagus and no evidence of esophageal stricture. She did have some erythema and inflammation in the upper esophagus which is felt secondary to pill trauma." She underwent "Barium swallow on 3/19 showed recurrent aspiration of contrast with each swallow due to abnormal motion of the epiglottis. There was retention of secretions and barium in the vallecula and perform sinuses which did not clear with repeated swallowing and clear spill over into the trachea. MBS performed during hospitalization showed "Oral phase of MBS appeared within functional limits. Pt. exhibited a moderate pharyngeal and moderate-severe cervical esophageal phase dysphagia with decreased laryngeal elevation resulting in maximum amount of pyriform sinus residue. Significantly reduced UES opening as a result of poor laryngeal elevation as well as decreased epiglotic inversion leading to aspiration during the swallow of nectar thick barium and during multiple swallows in attempts to clear pyriform sinus residue. She exhibited significant difficulty initiating multiple volitional swallows in  attempts to clear residue." "Pharygneal strength appeared to increase as study progressed. " MRI of brain was obtained showing no acute infarct.  Neurology was asked to evaluate.   When discussing her swolling issues, patient states her swallowing problems have been going on for quite some time. She tends to have more difficulty in the AM but after talking or swallowing for a prolonged period of time, the swallowing improves. She denies any diplopia, increased fatigue during the day, hoarse voice.  When asked if she has days which are better than others, she states she is unsure due to her always being on guard and now routinely cutting her food up into small pieces--that said, over the past year is when she has been taking her pills at night due to her feeling she is better at swallowing at night.  She tells me she has mentioned this to a  family friend who is a neurologist in [place] but never actually seen a neurologist for this issue.   Past Medical History  Diagnosis Date  . Hypertension   . Anxiety   . Cataract     left  . Depression   . GERD (gastroesophageal reflux disease)   . Osteoporosis   . Dysphagia     "have had it for 7 yr; got worse 4 days ago" (05/12/2012)  . Migraines     "menopause cured them" (05/12/2012)  . Arthritis     "qwhere; but it doesn't hurt" (05/12/2012)    Past Surgical History  Procedure Laterality Date  . Cataract extraction w/ intraocular lens implant Left ? 2010  . Esophagogastroduodenoscopy N/A 05/10/2012    Procedure: ESOPHAGOGASTRODUODENOSCOPY (EGD);  Surgeon: Beverley Fiedler, MD;  Location: [facility];  Service: Gastroenterology;  Laterality: N/A;  . Tonsillectomy  1940's    Family History  Problem Relation Age of Onset  . Arthritis Mother   . Hypertension Mother   . Dementia Mother   . Cancer Father     colon      Social History:  reports that she has never smoked. She has never used smokeless tobacco. She reports that she does not drink alcohol or  use illicit drugs.  Allergies  Allergen Reactions  . Sulfa Antibiotics Hives    Childhood allergy     MEDICATIONS:                                                                                                                     Prior to Admission:  Prescriptions prior to admission  Medication Sig Dispense Refill  . amitriptyline (ELAVIL) 50 MG tablet Take 225 mg by mouth at bedtime.       Marland Kitchen atenolol (TENORMIN) 50 MG tablet Take 1.5 tablets (75 mg total) by mouth daily.  180 tablet  4  . diazepam (VALIUM) 5 MG tablet Take 1 tablet (5 mg total) by mouth every 8 (eight) hours as needed.  60 tablet  4  . lisinopril (PRINIVIL,ZESTRIL) 40 MG tablet Take 40 mg by mouth daily.       Scheduled: . antiseptic oral rinse  15 mL Mouth Rinse BID  . enoxaparin (LOVENOX) injection  40 mg Subcutaneous Q24H  . metoprolol  5 mg Intravenous Q8H     ROS:  History obtained from the patient  General ROS: negative for - chills, fatigue, fever, night sweats, weight gain or weight loss Psychological ROS: negative for - behavioral disorder, hallucinations, memory difficulties, mood swings or suicidal ideation Ophthalmic ROS: negative for - blurry vision, double vision, eye pain or loss of vision ENT ROS: negative for - epistaxis, nasal discharge, oral lesions, sore throat, tinnitus or vertigo Allergy and Immunology ROS: negative for - hives or itchy/watery eyes Hematological and Lymphatic ROS: negative for - bleeding problems, bruising or swollen lymph nodes Endocrine ROS: negative for - galactorrhea, hair pattern changes, polydipsia/polyuria or temperature intolerance Respiratory ROS: negative for - cough, hemoptysis, shortness of breath or wheezing Cardiovascular ROS: negative for - chest pain, dyspnea on exertion, edema or irregular heartbeat Gastrointestinal ROS:  negative for - abdominal pain, diarrhea, hematemesis, nausea/vomiting or stool incontinence Genito-Urinary ROS: negative for - dysuria, hematuria, incontinence or urinary frequency/urgency Musculoskeletal ROS: negative for - joint swelling or muscular weakness Neurological ROS: as noted in HPI Dermatological ROS: negative for rash and skin lesion changes   Blood pressure 135/80, pulse 98, temperature 98.1 F (36.7 C), temperature source Oral, resp. rate 16, height 5\' 1"  (1.549 m), weight 65.5 kg (144 lb 6.4 oz), SpO2 100.00%.   Neurologic Examination:                                                                                                      Mental Status: Alert, oriented, thought content appropriate.  Speech fluent without evidence of aphasia.  Able to follow 3 step commands without difficulty. Cranial Nerves: II: Discs flat bilaterally; Visual fields grossly normal, pupils equal, round, reactive to light and accommodation III,IV, VI: ptosis not present, extra-ocular motions intact bilaterally V,VII: smile symmetric, facial light touch sensation normal bilaterally VIII: hearing normal bilaterally IX,X: gag reflex present XI: bilateral shoulder shrug XII: midline tongue extension Motor: Right : Upper extremity   5/5    Left:     Upper extremity   5/5  Lower extremity   5/5     Lower extremity   5/5 Tone and bulk:normal tone throughout; no atrophy noted Sensory: Pinprick and light touch intact throughout, bilaterally Deep Tendon Reflexes: 2+ and symmetric throughout Plantars: Right: downgoing   Left: downgoing Cerebellar: normal finger-to-nose,  normal heel-to-shin test CV: pulses palpable throughout    No results found for this basename: cbc, bmp, coags, chol, tri, ldl, hga1c    Results for orders placed during the hospital encounter of 05/12/12 (from the past 48 hour(s))  CBC WITH DIFFERENTIAL     Status: Abnormal   Collection Time    05/12/12  2:59 PM      Result  Value Range   WBC 10.6 (*) 4.0 - 10.5 K/uL   RBC 4.81  3.87 - 5.11 MIL/uL   Hemoglobin 14.6  12.0 - 15.0 g/dL   HCT 96.2  95.2 - 84.1 %   MCV 88.4  78.0 - 100.0 fL   MCH 30.4  26.0 - 34.0 pg   MCHC 34.4  30.0 - 36.0 g/dL   RDW 13.6  11.5 - 15.5 %   Platelets 393  150 - 400 K/uL   Neutrophils Relative 75  43 - 77 %   Neutro Abs 7.9 (*) 1.7 - 7.7 K/uL   Lymphocytes Relative 17  12 - 46 %   Lymphs Abs 1.8  0.7 - 4.0 K/uL   Monocytes Relative 8  3 - 12 %   Monocytes Absolute 0.8  0.1 - 1.0 K/uL   Eosinophils Relative 1  0 - 5 %   Eosinophils Absolute 0.1  0.0 - 0.7 K/uL   Basophils Relative 0  0 - 1 %   Basophils Absolute 0.0  0.0 - 0.1 K/uL  BASIC METABOLIC PANEL     Status: Abnormal   Collection Time    05/12/12  2:59 PM      Result Value Range   Sodium 140  135 - 145 mEq/L   Potassium 3.1 (*) 3.5 - 5.1 mEq/L   Chloride 98  96 - 112 mEq/L   CO2 23  19 - 32 mEq/L   Glucose, Bld 93  70 - 99 mg/dL   BUN 20  6 - 23 mg/dL   Creatinine, Ser 5.62  0.50 - 1.10 mg/dL   Calcium 9.7  8.4 - 13.0 mg/dL   GFR calc non Af Amer 48 (*) >90 mL/min   GFR calc Af Amer 55 (*) >90 mL/min   Comment:            The eGFR has been calculated     using the CKD EPI equation.     This calculation has not been     validated in all clinical     situations.     eGFR's persistently     <90 mL/min signify     possible Chronic Kidney Disease.  CBC     Status: None   Collection Time    05/12/12  5:49 PM      Result Value Range   WBC 10.0  4.0 - 10.5 K/uL   RBC 4.47  3.87 - 5.11 MIL/uL   Hemoglobin 13.7  12.0 - 15.0 g/dL   HCT 86.5  78.4 - 69.6 %   MCV 88.8  78.0 - 100.0 fL   MCH 30.6  26.0 - 34.0 pg   MCHC 34.5  30.0 - 36.0 g/dL   RDW 29.5  28.4 - 13.2 %   Platelets 386  150 - 400 K/uL  CREATININE, SERUM     Status: Abnormal   Collection Time    05/12/12  5:49 PM      Result Value Range   Creatinine, Ser 1.02  0.50 - 1.10 mg/dL   GFR calc non Af Amer 52 (*) >90 mL/min   GFR calc Af Amer 60  (*) >90 mL/min   Comment:            The eGFR has been calculated     using the CKD EPI equation.     This calculation has not been     validated in all clinical     situations.     eGFR's persistently     <90 mL/min signify     possible Chronic Kidney Disease.  TSH     Status: None   Collection Time    05/12/12  5:49 PM      Result Value Range   TSH 0.582  0.350 - 4.500 uIU/mL  COMPREHENSIVE METABOLIC PANEL     Status: Abnormal   Collection Time  05/13/12  5:05 AM      Result Value Range   Sodium 146 (*) 135 - 145 mEq/L   Potassium 3.1 (*) 3.5 - 5.1 mEq/L   Chloride 109  96 - 112 mEq/L   CO2 26  19 - 32 mEq/L   Glucose, Bld 132 (*) 70 - 99 mg/dL   BUN 14  6 - 23 mg/dL   Creatinine, Ser 1.61  0.50 - 1.10 mg/dL   Calcium 8.5  8.4 - 09.6 mg/dL   Total Protein 6.1  6.0 - 8.3 g/dL   Albumin 3.2 (*) 3.5 - 5.2 g/dL   AST 20  0 - 37 U/L   ALT 26  0 - 35 U/L   Alkaline Phosphatase 73  39 - 117 U/L   Total Bilirubin 1.0  0.3 - 1.2 mg/dL   GFR calc non Af Amer 64 (*) >90 mL/min   GFR calc Af Amer 74 (*) >90 mL/min   Comment:            The eGFR has been calculated     using the CKD EPI equation.     This calculation has not been     validated in all clinical     situations.     eGFR's persistently     <90 mL/min signify     possible Chronic Kidney Disease.  MAGNESIUM     Status: None   Collection Time    05/13/12  5:05 AM      Result Value Range   Magnesium 1.8  1.5 - 2.5 mg/dL  CBC     Status: Abnormal   Collection Time    05/13/12  5:05 AM      Result Value Range   WBC 8.5  4.0 - 10.5 K/uL   RBC 4.03  3.87 - 5.11 MIL/uL   Hemoglobin 12.2  12.0 - 15.0 g/dL   HCT 04.5 (*) 40.9 - 81.1 %   MCV 89.1  78.0 - 100.0 fL   MCH 30.3  26.0 - 34.0 pg   MCHC 34.0  30.0 - 36.0 g/dL   RDW 91.4  78.2 - 95.6 %   Platelets 339  150 - 400 K/uL  GLUCOSE, CAPILLARY     Status: Abnormal   Collection Time    05/13/12  8:17 AM      Result Value Range   Glucose-Capillary 116 (*) 70  - 99 mg/dL  BASIC METABOLIC PANEL     Status: Abnormal   Collection Time    05/14/12  4:45 AM      Result Value Range   Sodium 140  135 - 145 mEq/L   Potassium 3.6  3.5 - 5.1 mEq/L   Chloride 106  96 - 112 mEq/L   CO2 24  19 - 32 mEq/L   Glucose, Bld 112 (*) 70 - 99 mg/dL   BUN 7  6 - 23 mg/dL   Creatinine, Ser 2.13  0.50 - 1.10 mg/dL   Calcium 8.3 (*) 8.4 - 10.5 mg/dL   GFR calc non Af Amer 79 (*) >90 mL/min   GFR calc Af Amer >90  >90 mL/min   Comment:            The eGFR has been calculated     using the CKD EPI equation.     This calculation has not been     validated in all clinical     situations.     eGFR's persistently     <  90 mL/min signify     possible Chronic Kidney Disease.  GLUCOSE, CAPILLARY     Status: Abnormal   Collection Time    05/14/12  7:56 AM      Result Value Range   Glucose-Capillary 111 (*) 70 - 99 mg/dL    Mr Brain Wo Contrast  05/14/2012  *RADIOLOGY REPORT*  Clinical Data: Dysphagia.  CVA.  MRI HEAD WITHOUT CONTRAST  Technique:  Multiplanar, multiecho pulse sequences of the brain and surrounding structures were obtained according to standard protocol without intravenous contrast.  Comparison: None  Findings: Negative for acute infarct.  Chronic microvascular ischemic changes in the white matter bilaterally.  Brainstem and cerebellum are intact.  Negative for mass lesion.  Negative for intracranial hemorrhage. No midline shift.  Ventricle size is normal.  Small mastoid sinus effusion on the left.  IMPRESSION: Chronic microvascular ischemia in the white matter.  No acute infarct.   Original Report Authenticated By: Janeece Riggers, M.D.    Dg Swallowing Func-speech Pathology  05/13/2012  Savannah Diaz, CCC-SLP     05/13/2012  3:16 PM Objective Swallowing Evaluation: Modified Barium Swallowing Study   Patient Details  Name: Savannah Diaz MRN: [id number] Date of Birth: 09/08/1932  Today's Date: 05/13/2012 Time: 1005-1030 SLP Time Calculation (min): 25 min  Past  Medical History:  Past Medical History  Diagnosis Date  . Hypertension   . Anxiety   . Cataract     left  . Depression   . GERD (gastroesophageal reflux disease)   . Osteoporosis   . Dysphagia     "have had it for 7 yr; got worse 4 days ago" (05/12/2012)  . Migraines     "menopause cured them" (05/12/2012)  . Arthritis     "qwhere; but it doesn't hurt" (05/12/2012)   Past Surgical History:  Past Surgical History  Procedure Laterality Date  . Cataract extraction w/ intraocular lens implant Left ? 2010  . Esophagogastroduodenoscopy N/A 05/10/2012    Procedure: ESOPHAGOGASTRODUODENOSCOPY (EGD);  Surgeon: Beverley Fiedler, MD;  Location: [facility];  Service: Gastroenterology;   Laterality: N/A;  . Tonsillectomy  1940's   HPI:  Savannah Diaz is a 79 y.o. female with h/o hypertension, recently  was seen by Dr Rhea Belton for dysphagia and underwent EGD on 05/10/12  for dysphagia, hasn't been able to take water or food since  Sunday. Pt. reports Sunday a pill got lodged in her throat and  she has been expectorating mucous with wet vocal qualtiy and  coughing on her secretions since that time.   Pt. also reports  difficulty swallowing for the past 7 years (esophageal) which she  has compensated for. Pt. had EGD 05/10/12 revealing abnormal  mucousa on upper 1/3 of esophagus with question of pill induced  injury and smal hiatal hernia. Barium esophagram 05/12/12 showed  frequent aspiration with thin barium and significant pharyngeal  stasis.  She reports she needs a repeat EGD with dilatation of  the esophagus which was not done during initial procedure due to  esophageal trauma as reported by the pt.     Assessment / Plan / Recommendation Clinical Impression  Dysphagia Diagnosis: Moderate pharyngeal phase  dysphagia;Moderate cervical esophageal phase dysphagia;Severe  cervical esophageal phase dysphagia Clinical impression: Oral phase of MBS appeared within functional  limits.  Pt. exhibited a moderate pharyngeal and moderate-severe   cervical esophageal phase dysphagia with decreased laryngeal  elevation resulting in maximum amount of pyriform sinus residue.   Significantly reduced UES opening as  a result of poor laryngeal  elevation as well as decreased epiglotic inversion leading to  aspiration during the swallow of nectar thick barium and during  multiple swallows in attempts to clear pyriform sinus residue.   She exhibited significant difficulty initiating multiple  volitional swallows in attempts to clear residue.  Thin barium  appeared safer with only one episode of flash penetration due to  increased bolus weight and volume to assist in UES opening as  well as significantly decreased pyriform sinus residue.   Pharyngeal phase also characterized by decreased pharyngeal  contraction causing mild posterior pharyngeal wall residue  intermittently.  Pharygneal strength appeared to increase as  study progressed.  Pt. spontaneously performed a chin tuck with  majority of trials stating "this helps me."  Esophagus was  scanned which revealed barium descending then ascending mid  esophagus throughout study.  Etiology may be possible  neurological involvement as swallow function appears to be  indicative of  versus primary esophageal impairments.  SLP  recommends Dys 1 diet texture (due to increased cohesiveness of  puree and will advance as pt. able) and thin liquids avoiding  straws, multiple dry swallows, alternate solids/liquids sit  upright, stay upright 1 hour after meals and crush pills.   Recommendations discussed with pt. and MD (discussed possibility  of neuro consult) and continued ST for safety with diet.    Treatment Recommendation  Therapy as outlined in treatment plan below    Diet Recommendation Dysphagia 1 (Puree);Thin liquid   Liquid Administration via: Cup;No straw Medication Administration: Crushed with puree Supervision: Patient able to self feed;Intermittent supervision  to cue for compensatory strategies Compensations: Slow  rate;Small sips/bites;Multiple dry swallows  after each bite/sip;Follow solids with liquid Postural Changes and/or Swallow Maneuvers: Seated upright 90  degrees;Upright 30-60 min after meal;Chin tuck    Other  Recommendations Oral Care Recommendations: Oral care BID   Follow Up Recommendations   (to be determined)    Frequency and Duration min 2x/week  2 weeks   Pertinent Vitals/Pain     SLP Swallow Goals Patient will utilize recommended strategies during swallow to  increase swallowing safety with: Modified independent assistance      Reason for Referral Objectively evaluate swallowing function   Oral Phase Oral Preparation/Oral Phase Oral Phase: WFL   Pharyngeal Phase Pharyngeal Phase Pharyngeal Phase: Impaired Pharyngeal - Nectar Pharyngeal - Nectar Teaspoon: Reduced pharyngeal  peristalsis;Reduced epiglottic inversion;Penetration/Aspiration  during swallow;Moderate aspiration;Reduced laryngeal  elevation;Pharyngeal residue - pyriform sinuses;Pharyngeal  residue - valleculae;Reduced anterior laryngeal mobility;Reduced  airway/laryngeal closure (Pt. performed spontaneous chin tuck,  max residue pyriform si) Penetration/Aspiration details (nectar teaspoon): Material enters  airway, passes BELOW cords and not ejected out despite cough  attempt by patient;Material enters airway, remains ABOVE vocal  cords and not ejected out Pharyngeal - Nectar Cup: Reduced pharyngeal peristalsis;Reduced  epiglottic inversion;Reduced anterior laryngeal mobility;Reduced  laryngeal elevation;Reduced airway/laryngeal  closure;Penetration/Aspiration during swallow;Pharyngeal residue  - pyriform sinuses;Pharyngeal residue - valleculae Penetration/Aspiration details (nectar cup): Material enters  airway, CONTACTS cords and not ejected out;Material enters  airway, passes BELOW cords without attempt by patient to eject  out (silent aspiration) Pharyngeal - Thin Pharyngeal - Thin Teaspoon: Pharyngeal residue - pyriform  sinuses;Reduced  laryngeal elevation Pharyngeal - Thin Cup: Penetration/Aspiration during  swallow;Reduced anterior laryngeal mobility;Reduced epiglottic  inversion;Reduced airway/laryngeal closure;Reduced laryngeal  elevation;Pharyngeal residue - pyriform sinuses;Pharyngeal  residue - valleculae;Reduced tongue base retraction;Reduced  pharyngeal peristalsis Penetration/Aspiration details (thin cup): Material enters  airway, remains ABOVE vocal cords then ejected  out Pharyngeal - Solids Pharyngeal - Puree: Reduced pharyngeal peristalsis;Pharyngeal  residue - valleculae;Pharyngeal residue - pyriform  sinuses;Reduced anterior laryngeal mobility;Reduced laryngeal  elevation;Reduced tongue base retraction Pharyngeal - Mechanical Soft: Pharyngeal residue - posterior  pharnyx;Reduced laryngeal elevation  Cervical Esophageal Phase        Cervical Esophageal Phase Cervical Esophageal Phase: Impaired (Decreased UES opening)         Savannah Diaz M.Ed CCC-SLP Pager [phone]  05/13/2012       Felicie Morn PA-C [facility] [phone]  05/14/2012, 11:57 AM I have seen and evaluated the patient. I have reviewed the above note and made appropriate changes.   Assessment/Plan: [name] with 6-7 year history of dysphagia.  Patient has undergone extensive workup including EGD, MRI and MBS.  MRI  showed no focal etiology for patients dysphagia and MBS did show decreased laryngeal elevation and reduced UES opening as a result of poor laryngeal elevation. ENT has seen patient in hospital and note is pending. Etiology of her dysphagia is unclear at this time.  Considerations from a neurological perspective include myasthenia gravis or variant or much less likely, als.   I do wonder if this is really an acute on chronic presentation with some mild laryngeal irritation(possibly traumatic from pills?) causing increased secretion and the inflammation causing her increased difficulty.  Also, cessation of her anticholinergic  due to difficulty swallowing might have caused some increased secretion.   Recommend:  1) myasthenia panel (ordered) which can be followed up out patient with [facility] 2) EMG as out patient 3) Appointment has been made for patient on April 4th at 09:30 in the morning at [facility].  --[facility] [address] [place], [place] [address]--[phone]   Ritta Slot, MD [facility] [phone]  If 7pm- 7am, please page neurology on call at [phone].

## 2012-05-14 NOTE — Consult Note (Signed)
Reason for Consult:dysphagia with aspiration Referring Physician: attending MD  Savannah Diaz is an 79 y.o. female.  HPI: Who developed acute problems with swallowing this past weekend starting on Sunday. She has been under some stress this past week as her son in law was treated for headaches in [place]. This past weekend she got a pill caught in her throat that began the dysphagia. She saw a GI md on Mon and underwent upper endoscopy. And then had barium swallow which showed poor epiglottis mobility and significant aspiration. She was unable to swallow and was subsequently admitted to the hospital. Her voice and swallowing has improved some since admission. ENT consult was recommended.  Past Medical History  Diagnosis Date  . Hypertension   . Anxiety   . Cataract     left  . Depression   . GERD (gastroesophageal reflux disease)   . Osteoporosis   . Dysphagia     "have had it for 7 yr; got worse 4 days ago" (05/12/2012)  . Migraines     "menopause cured them" (05/12/2012)  . Arthritis     "qwhere; but it doesn't hurt" (05/12/2012)    Past Surgical History  Procedure Laterality Date  . Cataract extraction w/ intraocular lens implant Left ? 2010  . Esophagogastroduodenoscopy N/A 05/10/2012    Procedure: ESOPHAGOGASTRODUODENOSCOPY (EGD);  Surgeon: Beverley Fiedler, MD;  Location: [facility];  Service: Gastroenterology;  Laterality: N/A;  . Tonsillectomy  1940's    Social History:  reports that she has never smoked. She has never used smokeless tobacco. She reports that she does not drink alcohol or use illicit drugs.  Allergies:  Allergies  Allergen Reactions  . Sulfa Antibiotics Hives    Childhood allergy     Medications: I have reviewed the patient's current medications.  Results for orders placed during the hospital encounter of 05/12/12 (from the past 48 hour(s))  CBC     Status: None   Collection Time    05/12/12  5:49 PM      Result Value Range   WBC 10.0  4.0 - 10.5 K/uL    RBC 4.47  3.87 - 5.11 MIL/uL   Hemoglobin 13.7  12.0 - 15.0 g/dL   HCT 29.5  28.4 - 13.2 %   MCV 88.8  78.0 - 100.0 fL   MCH 30.6  26.0 - 34.0 pg   MCHC 34.5  30.0 - 36.0 g/dL   RDW 44.0  10.2 - 72.5 %   Platelets 386  150 - 400 K/uL  CREATININE, SERUM     Status: Abnormal   Collection Time    05/12/12  5:49 PM      Result Value Range   Creatinine, Ser 1.02  0.50 - 1.10 mg/dL   GFR calc non Af Amer 52 (*) >90 mL/min   GFR calc Af Amer 60 (*) >90 mL/min   Comment:            The eGFR has been calculated     using the CKD EPI equation.     This calculation has not been     validated in all clinical     situations.     eGFR's persistently     <90 mL/min signify     possible Chronic Kidney Disease.  TSH     Status: None   Collection Time    05/12/12  5:49 PM      Result Value Range   TSH 0.582  0.350 - 4.500 uIU/mL  COMPREHENSIVE METABOLIC PANEL     Status: Abnormal   Collection Time    05/13/12  5:05 AM      Result Value Range   Sodium 146 (*) 135 - 145 mEq/L   Potassium 3.1 (*) 3.5 - 5.1 mEq/L   Chloride 109  96 - 112 mEq/L   CO2 26  19 - 32 mEq/L   Glucose, Bld 132 (*) 70 - 99 mg/dL   BUN 14  6 - 23 mg/dL   Creatinine, Ser 1.61  0.50 - 1.10 mg/dL   Calcium 8.5  8.4 - 09.6 mg/dL   Total Protein 6.1  6.0 - 8.3 g/dL   Albumin 3.2 (*) 3.5 - 5.2 g/dL   AST 20  0 - 37 U/L   ALT 26  0 - 35 U/L   Alkaline Phosphatase 73  39 - 117 U/L   Total Bilirubin 1.0  0.3 - 1.2 mg/dL   GFR calc non Af Amer 64 (*) >90 mL/min   GFR calc Af Amer 74 (*) >90 mL/min   Comment:            The eGFR has been calculated     using the CKD EPI equation.     This calculation has not been     validated in all clinical     situations.     eGFR's persistently     <90 mL/min signify     possible Chronic Kidney Disease.  MAGNESIUM     Status: None   Collection Time    05/13/12  5:05 AM      Result Value Range   Magnesium 1.8  1.5 - 2.5 mg/dL  CBC     Status: Abnormal   Collection Time     05/13/12  5:05 AM      Result Value Range   WBC 8.5  4.0 - 10.5 K/uL   RBC 4.03  3.87 - 5.11 MIL/uL   Hemoglobin 12.2  12.0 - 15.0 g/dL   HCT 04.5 (*) 40.9 - 81.1 %   MCV 89.1  78.0 - 100.0 fL   MCH 30.3  26.0 - 34.0 pg   MCHC 34.0  30.0 - 36.0 g/dL   RDW 91.4  78.2 - 95.6 %   Platelets 339  150 - 400 K/uL  GLUCOSE, CAPILLARY     Status: Abnormal   Collection Time    05/13/12  8:17 AM      Result Value Range   Glucose-Capillary 116 (*) 70 - 99 mg/dL  BASIC METABOLIC PANEL     Status: Abnormal   Collection Time    05/14/12  4:45 AM      Result Value Range   Sodium 140  135 - 145 mEq/L   Potassium 3.6  3.5 - 5.1 mEq/L   Chloride 106  96 - 112 mEq/L   CO2 24  19 - 32 mEq/L   Glucose, Bld 112 (*) 70 - 99 mg/dL   BUN 7  6 - 23 mg/dL   Creatinine, Ser 2.13  0.50 - 1.10 mg/dL   Calcium 8.3 (*) 8.4 - 10.5 mg/dL   GFR calc non Af Amer 79 (*) >90 mL/min   GFR calc Af Amer >90  >90 mL/min   Comment:            The eGFR has been calculated     using the CKD EPI equation.     This calculation has not been     validated in  all clinical     situations.     eGFR's persistently     <90 mL/min signify     possible Chronic Kidney Disease.  GLUCOSE, CAPILLARY     Status: Abnormal   Collection Time    05/14/12  7:56 AM      Result Value Range   Glucose-Capillary 111 (*) 70 - 99 mg/dL  URINALYSIS, ROUTINE W REFLEX MICROSCOPIC     Status: Abnormal   Collection Time    05/14/12  1:17 PM      Result Value Range   Color, Urine YELLOW  YELLOW   APPearance CLEAR  CLEAR   Specific Gravity, Urine 1.008  1.005 - 1.030   pH 7.0  5.0 - 8.0   Glucose, UA NEGATIVE  NEGATIVE mg/dL   Hgb urine dipstick TRACE (*) NEGATIVE   Bilirubin Urine NEGATIVE  NEGATIVE   Ketones, ur NEGATIVE  NEGATIVE mg/dL   Protein, ur NEGATIVE  NEGATIVE mg/dL   Urobilinogen, UA 0.2  0.0 - 1.0 mg/dL   Nitrite NEGATIVE  NEGATIVE   Leukocytes, UA MODERATE (*) NEGATIVE  URINE MICROSCOPIC-ADD ON     Status: None    Collection Time    05/14/12  1:17 PM      Result Value Range   Squamous Epithelial / LPF RARE  RARE   WBC, UA 0-2  <3 WBC/hpf   RBC / HPF 0-2  <3 RBC/hpf   Bacteria, UA RARE  RARE    Mr Brain Wo Contrast  05/14/2012  *RADIOLOGY REPORT*  Clinical Data: Dysphagia.  CVA.  MRI HEAD WITHOUT CONTRAST  Technique:  Multiplanar, multiecho pulse sequences of the brain and surrounding structures were obtained according to standard protocol without intravenous contrast.  Comparison: None  Findings: Negative for acute infarct.  Chronic microvascular ischemic changes in the white matter bilaterally.  Brainstem and cerebellum are intact.  Negative for mass lesion.  Negative for intracranial hemorrhage. No midline shift.  Ventricle size is normal.  Small mastoid sinus effusion on the left.  IMPRESSION: Chronic microvascular ischemia in the white matter.  No acute infarct.   Original Report Authenticated By: Janeece Riggers, M.D.    Dg Swallowing Func-speech Pathology  05/13/2012  Breck Coons [place], CCC-SLP     05/13/2012  3:16 PM Objective Swallowing Evaluation: Modified Barium Swallowing Study   Patient Details  Name: Lashanta Elbe MRN: [id number] Date of Birth: 31-Jan-1933  Today's Date: 05/13/2012 Time: 1005-1030 SLP Time Calculation (min): 25 min  Past Medical History:  Past Medical History  Diagnosis Date  . Hypertension   . Anxiety   . Cataract     left  . Depression   . GERD (gastroesophageal reflux disease)   . Osteoporosis   . Dysphagia     "have had it for 7 yr; got worse 4 days ago" (05/12/2012)  . Migraines     "menopause cured them" (05/12/2012)  . Arthritis     "qwhere; but it doesn't hurt" (05/12/2012)   Past Surgical History:  Past Surgical History  Procedure Laterality Date  . Cataract extraction w/ intraocular lens implant Left ? 2010  . Esophagogastroduodenoscopy N/A 05/10/2012    Procedure: ESOPHAGOGASTRODUODENOSCOPY (EGD);  Surgeon: Beverley Fiedler, MD;  Location: [facility];  Service: Gastroenterology;    Laterality: N/A;  . Tonsillectomy  1940's   HPI:  Zoella Roberti is a 79 y.o. female with h/o hypertension, recently  was seen by Dr Rhea Belton for dysphagia and underwent EGD on 05/10/12  for dysphagia,  hasn't been able to take water or food since  Sunday. Pt. reports Sunday a pill got lodged in her throat and  she has been expectorating mucous with wet vocal qualtiy and  coughing on her secretions since that time.   Pt. also reports  difficulty swallowing for the past 7 years (esophageal) which she  has compensated for. Pt. had EGD 05/10/12 revealing abnormal  mucousa on upper 1/3 of esophagus with question of pill induced  injury and smal hiatal hernia. Barium esophagram 05/12/12 showed  frequent aspiration with thin barium and significant pharyngeal  stasis.  She reports she needs a repeat EGD with dilatation of  the esophagus which was not done during initial procedure due to  esophageal trauma as reported by the pt.     Assessment / Plan / Recommendation Clinical Impression  Dysphagia Diagnosis: Moderate pharyngeal phase  dysphagia;Moderate cervical esophageal phase dysphagia;Severe  cervical esophageal phase dysphagia Clinical impression: Oral phase of MBS appeared within functional  limits.  Pt. exhibited a moderate pharyngeal and moderate-severe  cervical esophageal phase dysphagia with decreased laryngeal  elevation resulting in maximum amount of pyriform sinus residue.   Significantly reduced UES opening as a result of poor laryngeal  elevation as well as decreased epiglotic inversion leading to  aspiration during the swallow of nectar thick barium and during  multiple swallows in attempts to clear pyriform sinus residue.   She exhibited significant difficulty initiating multiple  volitional swallows in attempts to clear residue.  Thin barium  appeared safer with only one episode of flash penetration due to  increased bolus weight and volume to assist in UES opening as  well as significantly decreased pyriform  sinus residue.   Pharyngeal phase also characterized by decreased pharyngeal  contraction causing mild posterior pharyngeal wall residue  intermittently.  Pharygneal strength appeared to increase as  study progressed.  Pt. spontaneously performed a chin tuck with  majority of trials stating "this helps me."  Esophagus was  scanned which revealed barium descending then ascending mid  esophagus throughout study.  Etiology may be possible  neurological involvement as swallow function appears to be  indicative of  versus primary esophageal impairments.  SLP  recommends Dys 1 diet texture (due to increased cohesiveness of  puree and will advance as pt. able) and thin liquids avoiding  straws, multiple dry swallows, alternate solids/liquids sit  upright, stay upright 1 hour after meals and crush pills.   Recommendations discussed with pt. and MD (discussed possibility  of neuro consult) and continued ST for safety with diet.    Treatment Recommendation  Therapy as outlined in treatment plan below    Diet Recommendation Dysphagia 1 (Puree);Thin liquid   Liquid Administration via: Cup;No straw Medication Administration: Crushed with puree Supervision: Patient able to self feed;Intermittent supervision  to cue for compensatory strategies Compensations: Slow rate;Small sips/bites;Multiple dry swallows  after each bite/sip;Follow solids with liquid Postural Changes and/or Swallow Maneuvers: Seated upright 90  degrees;Upright 30-60 min after meal;Chin tuck    Other  Recommendations Oral Care Recommendations: Oral care BID   Follow Up Recommendations   (to be determined)    Frequency and Duration min 2x/week  2 weeks   Pertinent Vitals/Pain     SLP Swallow Goals Patient will utilize recommended strategies during swallow to  increase swallowing safety with: Modified independent assistance      Reason for Referral Objectively evaluate swallowing function   Oral Phase Oral Preparation/Oral Phase Oral Phase: WFL   Pharyngeal Phase  Pharyngeal Phase Pharyngeal Phase: Impaired Pharyngeal - Nectar Pharyngeal - Nectar Teaspoon: Reduced pharyngeal  peristalsis;Reduced epiglottic inversion;Penetration/Aspiration  during swallow;Moderate aspiration;Reduced laryngeal  elevation;Pharyngeal residue - pyriform sinuses;Pharyngeal  residue - valleculae;Reduced anterior laryngeal mobility;Reduced  airway/laryngeal closure (Pt. performed spontaneous chin tuck,  max residue pyriform si) Penetration/Aspiration details (nectar teaspoon): Material enters  airway, passes BELOW cords and not ejected out despite cough  attempt by patient;Material enters airway, remains ABOVE vocal  cords and not ejected out Pharyngeal - Nectar Cup: Reduced pharyngeal peristalsis;Reduced  epiglottic inversion;Reduced anterior laryngeal mobility;Reduced  laryngeal elevation;Reduced airway/laryngeal  closure;Penetration/Aspiration during swallow;Pharyngeal residue  - pyriform sinuses;Pharyngeal residue - valleculae Penetration/Aspiration details (nectar cup): Material enters  airway, CONTACTS cords and not ejected out;Material enters  airway, passes BELOW cords without attempt by patient to eject  out (silent aspiration) Pharyngeal - Thin Pharyngeal - Thin Teaspoon: Pharyngeal residue - pyriform  sinuses;Reduced laryngeal elevation Pharyngeal - Thin Cup: Penetration/Aspiration during  swallow;Reduced anterior laryngeal mobility;Reduced epiglottic  inversion;Reduced airway/laryngeal closure;Reduced laryngeal  elevation;Pharyngeal residue - pyriform sinuses;Pharyngeal  residue - valleculae;Reduced tongue base retraction;Reduced  pharyngeal peristalsis Penetration/Aspiration details (thin cup): Material enters  airway, remains ABOVE vocal cords then ejected out Pharyngeal - Solids Pharyngeal - Puree: Reduced pharyngeal peristalsis;Pharyngeal  residue - valleculae;Pharyngeal residue - pyriform  sinuses;Reduced anterior laryngeal mobility;Reduced laryngeal  elevation;Reduced tongue base  retraction Pharyngeal - Mechanical Soft: Pharyngeal residue - posterior  pharnyx;Reduced laryngeal elevation  Cervical Esophageal Phase        Cervical Esophageal Phase Cervical Esophageal Phase: Impaired (Decreased UES opening)         Breck Coons [place] M.Ed CCC-SLP Pager [phone]  05/13/2012      AVW:[id number] without sore throat today   WG:[id number] alert with good voice and cooperative.       Oral exam normal with normal tongue mobility.        Neck without adenopathy or masses. Ok laryngeal elevation on swallowing water without gross aspiration.        Fiberoptic Laryngoscopy through the right nostril at bedside  Demonstrated normal nasopharynx. BOT clear.          Vallecula clear. Epiglottis normal. False and true cords clear with normal mobility. Pyriform sinus clear. Assessment/Plan: Dysphagia seems more functional. No structural abnormalities noted on FOL. Recommend speech path consult to assist in therapy and recommendations. Will follow up with patient this weekend and can follow up in my office as an outpatient if needed.  Koree Schopf 05/14/2012, 3:32 PM

## 2012-05-15 DIAGNOSIS — E860 Dehydration: Secondary | ICD-10-CM | POA: Diagnosis not present

## 2012-05-15 DIAGNOSIS — E876 Hypokalemia: Secondary | ICD-10-CM | POA: Diagnosis not present

## 2012-05-15 DIAGNOSIS — R1310 Dysphagia, unspecified: Secondary | ICD-10-CM | POA: Diagnosis not present

## 2012-05-15 DIAGNOSIS — I10 Essential (primary) hypertension: Secondary | ICD-10-CM | POA: Diagnosis not present

## 2012-05-15 LAB — GLUCOSE, CAPILLARY: Glucose-Capillary: 110 mg/dL — ABNORMAL HIGH (ref 70–99)

## 2012-05-15 MED ORDER — ENSURE COMPLETE PO LIQD: 237.0000 mL | Freq: Three times a day (TID) | ORAL | Status: DC

## 2012-05-15 NOTE — Progress Notes (Signed)
INITIAL NUTRITION ASSESSMENT  DOCUMENTATION CODES Per approved criteria  -Not Applicable   INTERVENTION: 1. Continue to encourage adequate intake of meals and supplements.  2. Patient with questions regarding lactose intolerance, answered by RD.  3. If patient not discharged today and appetite declines, will initiate calorie count.   NUTRITION DIAGNOSIS: Predicted suboptimal energy intake related to dysphagia as evidenced by recent history of poor intake.   Goal: Patient will meet >/=90% of estimated nutrition needs  Monitor:  PO intake, weight, labs, i/o's  Reason for Assessment: Consult, calorie count  79 y.o. female  Admitting Dx: Dysphagia  ASSESSMENT: Patient admitted with dysphagia. She has had decreased POs since last Sunday. MBS on 3/20 found moderate pharyngeal and moderate-severe cervical esophageal dysphagia. Recommended Dys 1 with thin liquids.   Patient eating well. Observed about 85% intake of breakfast today. Ensure Complete TID, patient concerned about drinking due to lactose intolerance.   Calorie count ordered, however, per RN, patient going home today. Will not start at this time.   Height: Ht Readings from Last 1 Encounters:  05/12/12 5\' 1"  (1.549 m)    Weight: Wt Readings from Last 1 Encounters:  05/14/12 144 lb 2.9 oz (65.4 kg)    Ideal Body Weight: 47.7 kg  % Ideal Body Weight: 137%  Wt Readings from Last 10 Encounters:  05/14/12 144 lb 2.9 oz (65.4 kg)  05/10/12 123 lb (55.792 kg)  05/10/12 124 lb (56.246 kg)  04/27/12 129 lb (58.514 kg)  05/13/11 116 lb (52.617 kg)  04/28/11 120 lb (54.432 kg)    Usual Body Weight: 56.8 kg  % Usual Body Weight: 115%  BMI:  Body mass index is 27.26 kg/(m^2). Patient is overweight.   Estimated Nutritional Needs: Kcal: 1300-1400 kcal Protein: 65-75 g Fluid: >1.9L  Skin: Intact  Diet Order: Dysphagia 1- thin liquids  EDUCATION NEEDS: -Education needs addressed   Intake/Output Summary  (Last 24 hours) at 05/15/12 1248 Last data filed at 05/15/12 0648  Gross per 24 hour  Intake    360 ml  Output      0 ml  Net    360 ml    Last BM: PTA   Labs:   Recent Labs Lab 05/12/12 1459 05/12/12 1749 05/13/12 0505 05/14/12 0445  NA 140  --  146* 140  K 3.1*  --  3.1* 3.6  CL 98  --  109 106  CO2 23  --  26 24  BUN 20  --  14 7  CREATININE 1.09 1.02 0.86 0.77  CALCIUM 9.7  --  8.5 8.3*  MG  --   --  1.8  --   GLUCOSE 93  --  132* 112*    CBG (last 3)   Recent Labs  05/13/12 0817 05/14/12 0756 05/15/12 0748  GLUCAP 116* 111* 110*    Scheduled Meds: . amitriptyline  225 mg Oral QHS  . antiseptic oral rinse  15 mL Mouth Rinse BID  . atenolol  75 mg Oral Daily  . calcium carbonate  1 tablet Oral TID WC  . enoxaparin (LOVENOX) injection  40 mg Subcutaneous Q24H  . feeding supplement  237 mL Oral TID BM    Continuous Infusions:   Past Medical History  Diagnosis Date  . Hypertension   . Anxiety   . Cataract     left  . Depression   . GERD (gastroesophageal reflux disease)   . Osteoporosis   . Dysphagia     "have had it  for 7 yr; got worse 4 days ago" (05/12/2012)  . Migraines     "menopause cured them" (05/12/2012)  . Arthritis     "qwhere; but it doesn't hurt" (05/12/2012)    Past Surgical History  Procedure Laterality Date  . Cataract extraction w/ intraocular lens implant Left ? 2010  . Esophagogastroduodenoscopy N/A 05/10/2012    Procedure: ESOPHAGOGASTRODUODENOSCOPY (EGD);  Surgeon: Beverley Fiedler, MD;  Location: [facility];  Service: Gastroenterology;  Laterality: N/A;  . Tonsillectomy  1940's    Linnell Fulling, RD, LDN Pager #: [phone] After-Hours Pager #: [phone]

## 2012-05-15 NOTE — Progress Notes (Signed)
I agree with the above documentation, including the assessment and plan. Expecting discharge home today, has neurology followup and diet recommendations per speech pathology

## 2012-05-15 NOTE — Progress Notes (Signed)
Patient ID: Savannah Diaz, female   DOB: 06-11-1932, 79 y.o.   MRN: [id number] [facility] Progress Note  Subjective: In good spirits-watched her eating some of her breakfast-she has aten about 1/3 of meal. Voice quality better. She feels she can manage at home  Objective:  Vital signs in last 24 hours: Temp:  [98 F (36.7 C)-98.9 F (37.2 C)] 98 F (36.7 C) (03/22 0505) Pulse Rate:  [81-108] 81 (03/22 0505) Resp:  [18-20] 18 (03/22 0505) BP: (109-159)/(67-82) 109/67 mmHg (03/22 0505) SpO2:  [98 %-100 %] 98 % (03/22 0505) Weight:  [144 lb 2.9 oz (65.4 kg)] 144 lb 2.9 oz (65.4 kg) (03/21 2043) Last BM Date: 05/14/12 General:   Alert,  Well-developed, elderly WF in NAD Heart:  Regular rate and rhythm; no murmurs Pulm;clear Abdomen:  Soft, nontender and nondistended. Normal bowel sounds, without guarding, and without rebound.   Extremities:  Without edema. Neurologic:  Alert and  oriented x4;  grossly normal neurologically. Psych:  Alert and cooperative. Normal mood and affect.  Intake/Output from previous day: 03/21 0701 - 03/22 0700 In: 360 [P.O.:60; I.V.:300] Out: -  Intake/Output this shift:    Lab Results:  Recent Labs  05/12/12 1459 05/12/12 1749 05/13/12 0505  WBC 10.6* 10.0 8.5  HGB 14.6 13.7 12.2  HCT 42.5 39.7 35.9*  PLT 393 386 339   BMET  Recent Labs  05/12/12 1459 05/12/12 1749 05/13/12 0505 05/14/12 0445  NA 140  --  146* 140  K 3.1*  --  3.1* 3.6  CL 98  --  109 106  CO2 23  --  26 24  GLUCOSE 93  --  132* 112*  BUN 20  --  14 7  CREATININE 1.09 1.02 0.86 0.77  CALCIUM 9.7  --  8.5 8.3*   LFT  Recent Labs  05/13/12 0505  PROT 6.1  ALBUMIN 3.2*  AST 20  ALT 26  ALKPHOS 73  BILITOT 1.0   PT/INR No results found for this basename: LABPROT, INR,  in the last 72 hours Hepatitis Panel No results found for this basename: HEPBSAG, HCVAB, HEPAIGM, HEPBIGM,  in the last 72 hours  Assessment / Plan: #1  79 yo female with acute  on chronic dysphagia- neuro workup in progress She is tolerating pureed diet at this point [facility] for discharge from GI standpoint. We will be happy to see her as needed. She has follow up with neuro on 4/4 with plans for EMG and myasthenia panel pending Continue pureed diet , and all od speech path reccomendations. Active Problems:   Hypertension   Dysphagia, unspecified   Hypokalemia   Dehydration with hypernatremia     LOS: 3 days   Savannah Diaz  05/15/2012, 10:37 AM

## 2012-05-15 NOTE — Discharge Summary (Signed)
Physician Discharge Summary  Savannah Diaz WUJ:[id number] DOB: 03/26/1932 DOA: 05/12/2012  PCP: Rogelia Boga, MD  Admit date: 05/12/2012 Discharge date: 05/15/2012  Time spent: less than 30 minutes  Recommendations for Outpatient Follow-up:  1. With Dr. Eleonore Chiquito, PCP in 1 week. 2. With [facility] on 05/28/2012 at 9:30 AM for follow up & EMG 3. With Dr. Inocencio Homes, GI as needed. 4. With Dr. Dillard Cannon, ENT as needed. 5. Followup myasthenia panel that was sent while patient was hospitalized. 6. Consider outpatient evaluation of abnormal chest x-ray  Discharge Diagnoses:  Active Problems:   Hypertension   Dysphagia, unspecified   Hypokalemia   Dehydration with hypernatremia   Discharge Condition: Improved & Stable  Diet recommendation: Dysphagia 1 diet and thin liquids.  Filed Weights   05/12/12 1700 05/13/12 2150 05/14/12 2043  Weight: 65.1 kg (143 lb 8.3 oz) 65.5 kg (144 lb 6.4 oz) 65.4 kg (144 lb 2.9 oz)    History of present illness:  79 year old female patient with history of HTN, anxiety, depression, GERD was admitted on 05/12/12 with complaints of generalized weakness. She has been recently evaluated by [facility] for dysphagia. EGD did not show any obstructing lesion. Barium swallow on 05/12/12 showed recurrent aspiration of contrast due to abnormal motion of the epiglottis. Patient was supposed to see ENT as OP. In the ED, she was clinically dehydrated, hypokalemia and mild leukocytosis.  Hospital Course:  1. Dysphagia: Features of overt aspiration on recent barium swallow. MBSS performed by ST who recommended dysphagia 1 diet with thin liquids. GI was consulted who recommended MRI brain, ENT and neurology consultation to evaluate for etiology. MRI brain was negative for acute findings. Neurology consulted and indicated that myasthenia gravis or ALS are on differential diagnosis but less likely. They have sent off myasthenia panel s  which needs to be followed as outpatient. They have also arranged for outpatient EMG and followup with [facility]. ENT evaluated and performed FOL which did not show any structural abnormalities and recommended speech therapy consultation. Patient has tolerated current diet well. GI has seen patient today and have cleared her for discharge home. 2. Hypokalemia: Repleted. Magnesium: 1.8 3. Dehydration with mild hypernatremia: Secondary to poor oral intake. Resolved after IV fluids.  4. Hypertension: Reasonable inpatient control. Continue home atenolol and lisinopril. 5. Anxiety: Continue home Valium. Patient has been on high dose amitriptyline for years. 6. Abnormal chest x-ray: Findings related to COPD. T9 compression deformity of indeterminate age. Possible basilar bronchiectasis but findings can be seen with chronic recurring aspiration. No evidence of consolidation. Patient denies dyspnea at this time. Afebrile and no leukocytosis. Outpatient followup and may consider CT chest at that time.  Consultants:  [facility]  Speech therapy  ENT  Neurology  Procedures:  MBSS  Bedside laryngoscopy by ENT  Discharge Exam:  Complaints: Tolerating diet. No complaints.  Filed Vitals:   05/14/12 1457 05/14/12 1855 05/14/12 2043 05/15/12 0505  BP: 142/80 127/67 159/82 109/67  Pulse: 102 108 100 81  Temp: 98.4 F (36.9 C) 98.5 F (36.9 C) 98.9 F (37.2 C) 98 F (36.7 C)  TempSrc: Oral Oral Oral Oral  Resp: 18 18 20 18   Height:      Weight:   65.4 kg (144 lb 2.9 oz)   SpO2: 100% 99% 100% 98%    General exam: Comfortable.  Respiratory system: Occasional basal crackles but otherwise clear to auscultation. No increased work of breathing.  Cardiovascular system: S1 & S2 heard,  RRR. No JVD, murmurs, gallops, clicks or pedal edema.   Gastrointestinal system: Abdomen is nondistended, soft and nontender. Normal bowel sounds heard.  Central nervous system: Alert and oriented. No  focal neurological deficits.  Extremities: Symmetric 5 x 5 power.   Discharge Instructions      Discharge Orders   Future Appointments Provider Department Dept Phone   05/28/2012 10:00 AM [place] Shown [facility] [phone]   05/28/2012 10:30 AM York Spaniel, MD [facility] [phone]   Future Orders Complete By Expires     Call MD for:  difficulty breathing, headache or visual disturbances  As directed     Call MD for:  extreme fatigue  As directed     Call MD for:  persistant dizziness or light-headedness  As directed     Call MD for:  persistant nausea and vomiting  As directed     Call MD for:  As directed     Comments:      Difficulty swallowing.    Discharge instructions  As directed     Comments:      DIET: Dysphagia 1 diet & thin liquids. Please follow all instructions provided by the speech therapist.    Increase activity slowly  As directed         Medication List    TAKE these medications       amitriptyline 50 MG tablet  Commonly known as:  ELAVIL  Take 225 mg by mouth at bedtime.     atenolol 50 MG tablet  Commonly known as:  TENORMIN  Take 1.5 tablets (75 mg total) by mouth daily.     diazepam 5 MG tablet  Commonly known as:  VALIUM  Take 1 tablet (5 mg total) by mouth every 8 (eight) hours as needed.     feeding supplement Liqd  Take 237 mLs by mouth 3 (three) times daily between meals.     lisinopril 40 MG tablet  Commonly known as:  PRINIVIL,ZESTRIL  Take 40 mg by mouth daily.       Follow-up Information   Follow up with Rogelia Boga, MD. Schedule an appointment as soon as possible for a visit in 1 week.   Contact information:   [address] [place] [address] [phone]       Schedule an appointment as soon as possible for a visit with PYRTLE, Carie Caddy, MD. (As needed)    Contact information:   [address] [place] [address] [phone]       Follow up with  [facility] On 05/28/2012. (9:30 AM. For follow up & EMG studies.)    Contact information:   [address] [place], [place] [address]--[phone]      Schedule an appointment as soon as possible for a visit with Dillard Cannon, MD. (As needed)    Contact information:   [address] [place] [address] [phone]        The results of significant diagnostics from this hospitalization (including imaging, microbiology, ancillary and laboratory) are listed below for reference.    Significant Diagnostic Studies: Dg Chest 2 View  05/12/2012  *RADIOLOGY REPORT*  Clinical Data: Esophageal narrowing.  Rule out aspiration.  CHEST - 2 VIEW  Comparison: None.  Findings: Lungs are markedly hyperaerated.  Mild bronchitic changes and interstitial prominence.  No consolidation or mass.  No pneumothorax or pleural effusion.  Minimal subsegmental atelectasis at the left base.  Minimal T9 compression fracture with  20% loss of height anteriorly and depression of the superior endplate is of indeterminate age. Streaky opacities at the medial lung bases may represent bronchiectasis.  IMPRESSION: Changes related to COPD.  T9 compression deformity of indeterminate age.  No evidence of consolidation.  Possible basilar bronchiectasis.  This can be seen with chronic recurring aspiration.  CT can be performed to further characterize.   Original Report Authenticated By: Jolaine Click, M.D.    Mr Brain Wo Contrast  05/14/2012  *RADIOLOGY REPORT*  Clinical Data: Dysphagia.  CVA.  MRI HEAD WITHOUT CONTRAST  Technique:  Multiplanar, multiecho pulse sequences of the brain and surrounding structures were obtained according to standard protocol without intravenous contrast.  Comparison: None  Findings: Negative for acute infarct.  Chronic microvascular ischemic changes in the white matter bilaterally.  Brainstem and cerebellum are intact.  Negative for mass lesion.  Negative for intracranial  hemorrhage. No midline shift.  Ventricle size is normal.  Small mastoid sinus effusion on the left.  IMPRESSION: Chronic microvascular ischemia in the white matter.  No acute infarct.   Original Report Authenticated By: Janeece Riggers, M.D.    Dg Esophagus  05/12/2012  *RADIOLOGY REPORT*  Clinical Data: Dysphagia.  ESOPHOGRAM/BARIUM SWALLOW  Technique:  Single contrast examination was performed using thin barium.  Fluoroscopy time:  1 minute 39 seconds.  Comparison:  None.  Findings:  The patient has aspiration of the thin barium with each swallow. There is  very limited deflection of the epiglottis with swallowing.  There is marked retention of barium in the valleculae and piriform sinuses.  The patient also swallows a large amount of air that distends the valleculae and piriform sinuses.  There is spillover of secretions into the trachea from the piriform sinuses.  There is suboptimal visualization of the remainder of the esophagus but there is no obstruction.  No mass lesion.  IMPRESSION: Recurrent aspiration of contrast with each swallow due to abnormal motion of the epiglottis.  Retention of secretions and barium in the valleculae and piriform sinuses which does not clear with repeated swallowing.  Spillover from the piriform sinuses into the trachea.  The esophagus appears normal.  I gave the patient percussion and postural drainage at the completion of the procedure to remove as much of the retained secretions and barium from the piriform sinuses and from the airway as possible.  Savannah Diaz tolerated the procedure well.   Original Report Authenticated By: Francene Boyers, M.D.    Dg Swallowing Func-speech Pathology  05/13/2012  Breck Coons [place], CCC-SLP     05/13/2012  3:16 PM Objective Swallowing Evaluation: Modified Barium Swallowing Study   Patient Details  Name: Aedyn Mckeon MRN: [id number] Date of Birth: [birth year]  Today's Date: 05/13/2012 Time: 1005-1030 SLP Time Calculation (min): 25 min  Past Medical  History:  Past Medical History  Diagnosis Date  . Hypertension   . Anxiety   . Cataract     left  . Depression   . GERD (gastroesophageal reflux disease)   . Osteoporosis   . Dysphagia     "have had it for 7 yr; got worse 4 days ago" (05/12/2012)  . Migraines     "menopause cured them" (05/12/2012)  . Arthritis     "qwhere; but it doesn't hurt" (05/12/2012)   Past Surgical History:  Past Surgical History  Procedure Laterality Date  . Cataract extraction w/ intraocular lens implant Left ? 2010  . Esophagogastroduodenoscopy N/A 05/10/2012    Procedure: ESOPHAGOGASTRODUODENOSCOPY (EGD);  Surgeon: Carie Caddy  Pyrtle, MD;  Location: [facility];  Service: Gastroenterology;   Laterality: N/A;  . Tonsillectomy  1940's   HPI:  Chaquana Nichols is a 79 y.o. female with h/o hypertension, recently  was seen by Dr Rhea Belton for dysphagia and underwent EGD on 05/10/12  for dysphagia, hasn't been able to take water or food since  Sunday. Pt. reports Sunday a pill got lodged in her throat and  she has been expectorating mucous with wet vocal qualtiy and  coughing on her secretions since that time.   Pt. also reports  difficulty swallowing for the past 7 years (esophageal) which she  has compensated for. Pt. had EGD 05/10/12 revealing abnormal  mucousa on upper 1/3 of esophagus with question of pill induced  injury and smal hiatal hernia. Barium esophagram 05/12/12 showed  frequent aspiration with thin barium and significant pharyngeal  stasis.  She reports she needs a repeat EGD with dilatation of  the esophagus which was not done during initial procedure due to  esophageal trauma as reported by the pt.     Assessment / Plan / Recommendation Clinical Impression  Dysphagia Diagnosis: Moderate pharyngeal phase  dysphagia;Moderate cervical esophageal phase dysphagia;Severe  cervical esophageal phase dysphagia Clinical impression: Oral phase of MBS appeared within functional  limits.  Pt. exhibited a moderate pharyngeal and moderate-severe  cervical  esophageal phase dysphagia with decreased laryngeal  elevation resulting in maximum amount of pyriform sinus residue.   Significantly reduced UES opening as a result of poor laryngeal  elevation as well as decreased epiglotic inversion leading to  aspiration during the swallow of nectar thick barium and during  multiple swallows in attempts to clear pyriform sinus residue.   She exhibited significant difficulty initiating multiple  volitional swallows in attempts to clear residue.  Thin barium  appeared safer with only one episode of flash penetration due to  increased bolus weight and volume to assist in UES opening as  well as significantly decreased pyriform sinus residue.   Pharyngeal phase also characterized by decreased pharyngeal  contraction causing mild posterior pharyngeal wall residue  intermittently.  Pharygneal strength appeared to increase as  study progressed.  Pt. spontaneously performed a chin tuck with  majority of trials stating "this helps me."  Esophagus was  scanned which revealed barium descending then ascending mid  esophagus throughout study.  Etiology may be possible  neurological involvement as swallow function appears to be  indicative of  versus primary esophageal impairments.  SLP  recommends Dys 1 diet texture (due to increased cohesiveness of  puree and will advance as pt. able) and thin liquids avoiding  straws, multiple dry swallows, alternate solids/liquids sit  upright, stay upright 1 hour after meals and crush pills.   Recommendations discussed with pt. and MD (discussed possibility  of neuro consult) and continued ST for safety with diet.    Treatment Recommendation  Therapy as outlined in treatment plan below    Diet Recommendation Dysphagia 1 (Puree);Thin liquid   Liquid Administration via: Cup;No straw Medication Administration: Crushed with puree Supervision: Patient able to self feed;Intermittent supervision  to cue for compensatory strategies Compensations: Slow rate;Small  sips/bites;Multiple dry swallows  after each bite/sip;Follow solids with liquid Postural Changes and/or Swallow Maneuvers: Seated upright 90  degrees;Upright 30-60 min after meal;Chin tuck    Other  Recommendations Oral Care Recommendations: Oral care BID   Follow Up Recommendations   (to be determined)    Frequency and Duration min 2x/week  2 weeks   Pertinent  Vitals/Pain     SLP Swallow Goals Patient will utilize recommended strategies during swallow to  increase swallowing safety with: Modified independent assistance      Reason for Referral Objectively evaluate swallowing function   Oral Phase Oral Preparation/Oral Phase Oral Phase: WFL   Pharyngeal Phase Pharyngeal Phase Pharyngeal Phase: Impaired Pharyngeal - Nectar Pharyngeal - Nectar Teaspoon: Reduced pharyngeal  peristalsis;Reduced epiglottic inversion;Penetration/Aspiration  during swallow;Moderate aspiration;Reduced laryngeal  elevation;Pharyngeal residue - pyriform sinuses;Pharyngeal  residue - valleculae;Reduced anterior laryngeal mobility;Reduced  airway/laryngeal closure (Pt. performed spontaneous chin tuck,  max residue pyriform si) Penetration/Aspiration details (nectar teaspoon): Material enters  airway, passes BELOW cords and not ejected out despite cough  attempt by patient;Material enters airway, remains ABOVE vocal  cords and not ejected out Pharyngeal - Nectar Cup: Reduced pharyngeal peristalsis;Reduced  epiglottic inversion;Reduced anterior laryngeal mobility;Reduced  laryngeal elevation;Reduced airway/laryngeal  closure;Penetration/Aspiration during swallow;Pharyngeal residue  - pyriform sinuses;Pharyngeal residue - valleculae Penetration/Aspiration details (nectar cup): Material enters  airway, CONTACTS cords and not ejected out;Material enters  airway, passes BELOW cords without attempt by patient to eject  out (silent aspiration) Pharyngeal - Thin Pharyngeal - Thin Teaspoon: Pharyngeal residue - pyriform  sinuses;Reduced laryngeal  elevation Pharyngeal - Thin Cup: Penetration/Aspiration during  swallow;Reduced anterior laryngeal mobility;Reduced epiglottic  inversion;Reduced airway/laryngeal closure;Reduced laryngeal  elevation;Pharyngeal residue - pyriform sinuses;Pharyngeal  residue - valleculae;Reduced tongue base retraction;Reduced  pharyngeal peristalsis Penetration/Aspiration details (thin cup): Material enters  airway, remains ABOVE vocal cords then ejected out Pharyngeal - Solids Pharyngeal - Puree: Reduced pharyngeal peristalsis;Pharyngeal  residue - valleculae;Pharyngeal residue - pyriform  sinuses;Reduced anterior laryngeal mobility;Reduced laryngeal  elevation;Reduced tongue base retraction Pharyngeal - Mechanical Soft: Pharyngeal residue - posterior  pharnyx;Reduced laryngeal elevation  Cervical Esophageal Phase        Cervical Esophageal Phase Cervical Esophageal Phase: Impaired (Decreased UES opening)         Breck Coons [place] M.Ed CCC-SLP Pager [phone]  05/13/2012      Microbiology: No results found for this or any previous visit (from the past 240 hour(s)).   Labs: Basic Metabolic Panel:  Recent Labs Lab 05/12/12 1459 05/12/12 1749 05/13/12 0505 05/14/12 0445  NA 140  --  146* 140  K 3.1*  --  3.1* 3.6  CL 98  --  109 106  CO2 23  --  26 24  GLUCOSE 93  --  132* 112*  BUN 20  --  14 7  CREATININE 1.09 1.02 0.86 0.77  CALCIUM 9.7  --  8.5 8.3*  MG  --   --  1.8  --    Liver Function Tests:  Recent Labs Lab 05/13/12 0505  AST 20  ALT 26  ALKPHOS 73  BILITOT 1.0  PROT 6.1  ALBUMIN 3.2*   No results found for this basename: LIPASE, AMYLASE,  in the last 168 hours No results found for this basename: AMMONIA,  in the last 168 hours CBC:  Recent Labs Lab 05/12/12 1459 05/12/12 1749 05/13/12 0505  WBC 10.6* 10.0 8.5  NEUTROABS 7.9*  --   --   HGB 14.6 13.7 12.2  HCT 42.5 39.7 35.9*  MCV 88.4 88.8 89.1  PLT 393 386 339   Cardiac Enzymes: No results found for this basename:  CKTOTAL, CKMB, CKMBINDEX, TROPONINI,  in the last 168 hours BNP: BNP (last 3 results) No results found for this basename: PROBNP,  in the last 8760 hours CBG:  Recent Labs Lab 05/13/12 0817 05/14/12 0756 05/15/12 0748  GLUCAP 116* 111*  110*    Additional labs:    Signed:  Lynanne Delgreco  [facility] 05/15/2012, 12:50 PM

## 2012-05-15 NOTE — Progress Notes (Signed)
Patient was discharged home with son. Patient was given discharge instructions and information on prescriptions. Patient was given detailed instructions on dys 1 pureed diet. Patient was told to contact the doctor with questions and concerns. Patient was stable upon discharge.

## 2012-05-18 ENCOUNTER — Telehealth: Payer: Self-pay | Admitting: *Deleted

## 2012-05-18 ENCOUNTER — Ambulatory Visit (INDEPENDENT_AMBULATORY_CARE_PROVIDER_SITE_OTHER): Payer: Medicare Other | Admitting: Internal Medicine

## 2012-05-18 ENCOUNTER — Encounter: Payer: Self-pay | Admitting: Internal Medicine

## 2012-05-18 VITALS — BP 120/80 | HR 84 | Temp 98.4°F | Resp 18 | Wt 124.0 lb

## 2012-05-18 DIAGNOSIS — I10 Essential (primary) hypertension: Secondary | ICD-10-CM

## 2012-05-18 DIAGNOSIS — R1310 Dysphagia, unspecified: Secondary | ICD-10-CM

## 2012-05-18 DIAGNOSIS — M1990 Unspecified osteoarthritis, unspecified site: Secondary | ICD-10-CM

## 2012-05-18 NOTE — Telephone Encounter (Signed)
Transitional Care Management  Admit date: 05/12/2012 Discharge Date: 05/15/2012  Discharge Diagnosis: Active Problems:   HTN   Dysphagia, unspecified   Hypokalemia   Dehydration with hypernatremia  Talked with pt and she states she was admitted to hospital  due to weakness.  She had seen  GI for difficulty swallowing prior to hospital admission and   had EGD which did not show any obstructing lesion. Barium swallow on 3/19 showed recurrent aspiration of contrast due to abnormal motion of the epiglottis.  She had consult with GI while in hospital. Patient is knowledgeable of condition and treatment and is having difficulty swallowing medication.  She is currently crushing pills and taking with applesauce and plans on asking Dr Amador Cunas if any of her medication comes in liquid form. She was placed on a dysphagia 1 diet and thin liquid after swallowing study and tolerating it .  Follow up hospital appointment with Dr Amador Cunas is scheduled for 05/18/2012

## 2012-05-18 NOTE — Patient Instructions (Signed)
Limit your sodium (Salt) intake  Return in 4 months for follow-up  

## 2012-05-18 NOTE — Progress Notes (Signed)
Subjective:    Patient ID: Savannah Diaz, female    DOB: 03-23-1932, 79 y.o.   MRN: [id number]  HPI  79 year old patient who is seen today in followup from a recent hospital discharge. She has a long history dysphagia that had intensified several days prior to her hospital admission. She had a full GI evaluation and was also seen in consultation by neurology and ENT. Hospital records reviewed Multiple questions were answered. Since her discharge she has done quite well. She does have a neurology followup scheduled. Stable medical problems include hypertension and osteoarthritis. She has been careful with her diet but denies any significant swallowing difficulties. No history of postprandial coughing. No fever  Past Medical History  Diagnosis Date  . Hypertension   . Anxiety   . Cataract     left  . Depression   . GERD (gastroesophageal reflux disease)   . Osteoporosis   . Dysphagia     "have had it for 7 yr; got worse 4 days ago" (05/12/2012)  . Migraines     "menopause cured them" (05/12/2012)  . Arthritis     "qwhere; but it doesn't hurt" (05/12/2012)    History   Social History  . Marital Status: Widowed    Spouse Name: N/A    Number of Children: N/A  . Years of Education: N/A   Occupational History  . Not on file.   Social History Main Topics  . Smoking status: Never Smoker   . Smokeless tobacco: Never Used  . Alcohol Use: No     Comment: 05/12/2012 "have a drink hardly ever anymore"  . Drug Use: No  . Sexually Active: No   Other Topics Concern  . Not on file   Social History Narrative  . No narrative on file    Past Surgical History  Procedure Laterality Date  . Cataract extraction w/ intraocular lens implant Left ? 2010  . Esophagogastroduodenoscopy N/A 05/10/2012    Procedure: ESOPHAGOGASTRODUODENOSCOPY (EGD);  Surgeon: Beverley Fiedler, MD;  Location: [facility];  Service: Gastroenterology;  Laterality: N/A;  . Tonsillectomy  1940's    Family History   Problem Relation Age of Onset  . Arthritis Mother   . Hypertension Mother   . Dementia Mother   . Cancer Father     colon     Allergies  Allergen Reactions  . Lactose Intolerance (Gi)   . Sulfa Antibiotics Hives    Childhood allergy     Current Outpatient Prescriptions on File Prior to Visit  Medication Sig Dispense Refill  . amitriptyline (ELAVIL) 50 MG tablet Take 225 mg by mouth at bedtime.       Marland Kitchen atenolol (TENORMIN) 50 MG tablet Take 1.5 tablets (75 mg total) by mouth daily.  180 tablet  4  . diazepam (VALIUM) 5 MG tablet Take 1 tablet (5 mg total) by mouth every 8 (eight) hours as needed.  60 tablet  4  . lisinopril (PRINIVIL,ZESTRIL) 40 MG tablet Take 40 mg by mouth daily.      . feeding supplement (ENSURE COMPLETE) LIQD Take 237 mLs by mouth 3 (three) times daily between meals.       No current facility-administered medications on file prior to visit.    BP 120/80  Pulse 84  Temp(Src) 98.4 F (36.9 C) (Oral)  Resp 18  Wt 124 lb (56.246 kg)  BMI 23.44 kg/m2  SpO2 97%       Review of Systems  Constitutional: Negative.   HENT: Negative for  hearing loss, congestion, sore throat, rhinorrhea, dental problem, sinus pressure and tinnitus.   Eyes: Negative for pain, discharge and visual disturbance.  Respiratory: Negative for cough and shortness of breath.   Cardiovascular: Negative for chest pain, palpitations and leg swelling.  Gastrointestinal: Negative for nausea, vomiting, abdominal pain, diarrhea, constipation, blood in stool and abdominal distention.  Genitourinary: Negative for dysuria, urgency, frequency, hematuria, flank pain, vaginal bleeding, vaginal discharge, difficulty urinating, vaginal pain and pelvic pain.  Musculoskeletal: Negative for joint swelling, arthralgias and gait problem.  Skin: Negative for rash.  Neurological: Negative for dizziness, syncope, speech difficulty, weakness, numbness and headaches.  Hematological: Negative for adenopathy.   Psychiatric/Behavioral: Negative for behavioral problems, dysphoric mood and agitation. The patient is not nervous/anxious.        Objective:   Physical Exam  Constitutional: She is oriented to person, place, and time. She appears well-developed and well-nourished.  HENT:  Head: Normocephalic.  Right Ear: External ear normal.  Left Ear: External ear normal.  Mouth/Throat: Oropharynx is clear and moist.  Eyes: Conjunctivae and EOM are normal. Pupils are equal, round, and reactive to light.  Neck: Normal range of motion. Neck supple. No thyromegaly present.  Cardiovascular: Normal rate, regular rhythm, normal heart sounds and intact distal pulses.   Pulmonary/Chest: Effort normal and breath sounds normal. No respiratory distress. She has no wheezes. She has no rales.  Abdominal: Soft. Bowel sounds are normal. She exhibits no distension and no mass. There is no tenderness. There is no guarding.  Musculoskeletal: Normal range of motion.  Lymphadenopathy:    She has no cervical adenopathy.  Neurological: She is alert and oriented to person, place, and time.  Skin: Skin is warm and dry. No rash noted.  Psychiatric: She has a normal mood and affect. Her behavior is normal.          Assessment & Plan:   Dysphagia. This appears to be a clinically stable. The patient does have neurology and GI followup. No further episodes of solid food (pill) dysphagia. Hypertension well controlled History of dehydration with hypokalemia. Osteoarthritis  Followup neurology and GI as scheduled Return here as needed or in 4-6 months

## 2012-05-20 NOTE — Telephone Encounter (Signed)
Daughter took pt to the hospital and has been see by an ENT

## 2012-05-23 ENCOUNTER — Encounter: Payer: Self-pay | Admitting: Internal Medicine

## 2012-05-28 ENCOUNTER — Ambulatory Visit (INDEPENDENT_AMBULATORY_CARE_PROVIDER_SITE_OTHER): Payer: Medicare Other

## 2012-05-28 ENCOUNTER — Ambulatory Visit (INDEPENDENT_AMBULATORY_CARE_PROVIDER_SITE_OTHER): Payer: Medicare Other | Admitting: Neurology

## 2012-05-28 DIAGNOSIS — R1310 Dysphagia, unspecified: Secondary | ICD-10-CM

## 2012-05-28 DIAGNOSIS — Z0289 Encounter for other administrative examinations: Secondary | ICD-10-CM

## 2012-05-28 NOTE — Procedures (Signed)
  HISTORY:  Savannah Diaz is a 79 year old patient with a history of problems with swallowing dating back about 7 years. The patient recently was in the hospital with severe dysphagia, and an extensive workup did not reveal the etiology of her swallowing problems. The patient is now back to her baseline. The patient is being evaluated for a possible neuromuscular disorder as an etiology for her swallowing.  NERVE CONDUCTION STUDIES:  Nerve conduction studies were performed on right upper extremity. The distal motor latencies and motor amplitudes for the median and ulnar nerves were within normal limits. The F wave latencies and nerve conduction velocities for these nerves were also normal. The sensory latencies for the median and ulnar nerves were normal.  Nerve conduction studies were performed on right lower extremity. The distal motor latencies and motor amplitudes for the peroneal and posterior tibial nerves were within normal limits. The nerve conduction velocities for these nerves were also normal. The sensory latencies for the peroneal nerve was within normal limits.    EMG STUDIES:  EMG study was performed on the right upper extremity:  The first dorsal interosseous muscle reveals 2 to 4 K units with full recruitment. No fibrillations or positive waves were noted. The abductor pollicis brevis muscle reveals 2 to 4 K units with full recruitment. No fibrillations or positive waves were noted. The extensor indicis proprius muscle reveals 1 to 3 K units with full recruitment. No fibrillations or positive waves were noted. The pronator teres muscle reveals 2 to 3 K units with full recruitment. No fibrillations or positive waves were noted. The biceps muscle reveals 1 to 2 K units with full recruitment. No fibrillations or positive waves were noted. The triceps muscle reveals 2 to 4 K units with full recruitment. No fibrillations or positive waves were noted. The anterior deltoid muscle reveals  2 to 3 K units with full recruitment. No fibrillations or positive waves were noted. The cervical paraspinal muscles were tested at 2 levels. No abnormalities of insertional activity were seen at either level tested. There was good relaxation.  EMG study was performed on the right lower extremity:  The tibialis anterior muscle reveals 2 to 4K motor units with full recruitment. No fibrillations or positive waves were seen. The peroneus tertius muscle reveals 2 to 4K motor units with full recruitment. No fibrillations or positive waves were seen. The medial gastrocnemius muscle reveals 1 to 3K motor units with full recruitment. No fibrillations or positive waves were seen. The vastus lateralis muscle reveals 2 to 4K motor units with full recruitment. No fibrillations or positive waves were seen. The iliopsoas muscle reveals 2 to 4K motor units with full recruitment. No fibrillations or positive waves were seen. The biceps femoris muscle (long head) reveals 2 to 4K motor units with full recruitment. No fibrillations or positive waves were seen. The lumbosacral paraspinal muscles were tested at 3 levels, and revealed no abnormalities of insertional activity at all 3 levels tested. There was good relaxation.    IMPRESSION:  Nerve conduction studies done on the right upper and right lower extremities were unremarkable, without evidence of a peripheral neuropathy. EMG evaluation of the right upper and right lower extremities were normal. No evidence of a myopathic disorder is seen on this evaluation.  Marlan Palau MD 05/28/2012 12:57 PM  [facility] [address] [place], [place] [address]  Phone [phone] Fax [phone]

## 2012-06-13 DIAGNOSIS — R1310 Dysphagia, unspecified: Secondary | ICD-10-CM | POA: Diagnosis not present

## 2012-06-13 DIAGNOSIS — I10 Essential (primary) hypertension: Secondary | ICD-10-CM | POA: Diagnosis not present

## 2012-06-23 ENCOUNTER — Ambulatory Visit: Payer: Medicare Other

## 2012-07-01 ENCOUNTER — Ambulatory Visit (INDEPENDENT_AMBULATORY_CARE_PROVIDER_SITE_OTHER): Payer: Medicare Other | Admitting: Internal Medicine

## 2012-07-01 ENCOUNTER — Ambulatory Visit: Payer: Self-pay | Admitting: Family Medicine

## 2012-07-01 ENCOUNTER — Encounter: Payer: Self-pay | Admitting: Internal Medicine

## 2012-07-01 ENCOUNTER — Telehealth: Payer: Self-pay | Admitting: Internal Medicine

## 2012-07-01 VITALS — BP 156/100 | HR 92 | Temp 99.7°F | Resp 20 | Wt 127.0 lb

## 2012-07-01 DIAGNOSIS — H547 Unspecified visual loss: Secondary | ICD-10-CM | POA: Diagnosis not present

## 2012-07-01 DIAGNOSIS — I10 Essential (primary) hypertension: Secondary | ICD-10-CM | POA: Diagnosis not present

## 2012-07-01 NOTE — Telephone Encounter (Signed)
Patient Information:  Caller Name: Savannah Diaz  Phone: [phone]  Patient: Savannah Diaz, Savannah Diaz  Gender: Female  DOB: 1933/02/16  Age: 79 Years  PCP: Eleonore Chiquito [facility])  Office Follow Up:  Does the office need to follow up with this patient?: Yes  Instructions For The Office: No appt available with Dr. Amador Cunas within dispositioned time frame krs/can  RN Note:  Onset of blurred vision in just the left eye 06/29/12.  Denies pain, redness, or swelling.  Patient lives in [place] 6 months out of the year and does not have opthalmologist here.  Per eye/vision change protocol, advised appt within 8 hours; no appts available with PCP.  Per office protocol, info to office for provider review/appt workin/callback.  May reach patient at [phone].  krs/can  Symptoms  Reason For Call & Symptoms: blurred vision  Reviewed Health History In EMR: Yes  Reviewed Medications In EMR: Yes  Reviewed Allergies In EMR: Yes  Reviewed Surgeries / Procedures: Yes  Date of Onset of Symptoms: 06/29/2012  Guideline(s) Used:  No Protocol Available - Sick Adult  Disposition Per Guideline:   See Today in Office  Reason For Disposition Reached:   Nursing judgment  Advice Given:  N/A  Patient Will Follow Care Advice:  YES

## 2012-07-01 NOTE — Progress Notes (Signed)
Subjective:    Patient ID: Savannah Diaz, female    DOB: March 26, 1932, 79 y.o.   MRN: [id number]  HPI  79 year old patient who has a history of treated hypertension. She was hospitalized her in the spring and blood pressure was well-controlled as an inpatient. She presents with a chief complaint of diminished visual acuity involving her left eye this has been present for probably at least 2 weeks. She initially felt that this was due  To the lens on her glasses needing cleaning. Denies any eye pain. No double vision or other complaints  Past Medical History  Diagnosis Date  . Hypertension   . Anxiety   . Cataract     left  . Depression   . GERD (gastroesophageal reflux disease)   . Osteoporosis   . Dysphagia     "have had it for 7 yr; got worse 4 days ago" (05/12/2012)  . Migraines     "menopause cured them" (05/12/2012)  . Arthritis     "qwhere; but it doesn't hurt" (05/12/2012)    History   Social History  . Marital Status: Widowed    Spouse Name: N/A    Number of Children: N/A  . Years of Education: N/A   Occupational History  . Not on file.   Social History Main Topics  . Smoking status: Never Smoker   . Smokeless tobacco: Never Used  . Alcohol Use: No     Comment: 05/12/2012 "have a drink hardly ever anymore"  . Drug Use: No  . Sexually Active: No   Other Topics Concern  . Not on file   Social History Narrative  . No narrative on file    Past Surgical History  Procedure Laterality Date  . Cataract extraction w/ intraocular lens implant Left ? 2010  . Esophagogastroduodenoscopy N/A 05/10/2012    Procedure: ESOPHAGOGASTRODUODENOSCOPY (EGD);  Surgeon: Beverley Fiedler, MD;  Location: [facility];  Service: Gastroenterology;  Laterality: N/A;  . Tonsillectomy  1940's    Family History  Problem Relation Age of Onset  . Arthritis Mother   . Hypertension Mother   . Dementia Mother   . Cancer Father     colon     Allergies  Allergen Reactions  . Lactose  Intolerance (Gi)   . Sulfa Antibiotics Hives    Childhood allergy     Current Outpatient Prescriptions on File Prior to Visit  Medication Sig Dispense Refill  . amitriptyline (ELAVIL) 50 MG tablet Take 225 mg by mouth at bedtime.       Marland Kitchen atenolol (TENORMIN) 50 MG tablet Take 1.5 tablets (75 mg total) by mouth daily.  180 tablet  4  . diazepam (VALIUM) 5 MG tablet Take 1 tablet (5 mg total) by mouth every 8 (eight) hours as needed.  60 tablet  4  . feeding supplement (ENSURE COMPLETE) LIQD Take 237 mLs by mouth 3 (three) times daily between meals.      Marland Kitchen lisinopril (PRINIVIL,ZESTRIL) 40 MG tablet Take 40 mg by mouth daily.       No current facility-administered medications on file prior to visit.    BP 156/100  Pulse 92  Temp(Src) 99.7 F (37.6 C) (Oral)  Resp 20  Wt 127 lb (57.607 kg)  BMI 24.01 kg/m2  SpO2 95%       Review of Systems  Constitutional: Negative.   HENT: Negative for hearing loss, congestion, sore throat, rhinorrhea, dental problem, sinus pressure and tinnitus.   Eyes: Positive for visual disturbance. Negative  for photophobia, pain, discharge, redness and itching.  Respiratory: Negative for cough and shortness of breath.   Cardiovascular: Negative for chest pain, palpitations and leg swelling.  Gastrointestinal: Negative for nausea, vomiting, abdominal pain, diarrhea, constipation, blood in stool and abdominal distention.  Genitourinary: Negative for dysuria, urgency, frequency, hematuria, flank pain, vaginal bleeding, vaginal discharge, difficulty urinating, vaginal pain and pelvic pain.  Musculoskeletal: Negative for joint swelling, arthralgias and gait problem.  Skin: Negative for rash.  Neurological: Negative for dizziness, syncope, speech difficulty, weakness, numbness and headaches.  Hematological: Negative for adenopathy.  Psychiatric/Behavioral: Negative for behavioral problems, dysphoric mood and agitation. The patient is not nervous/anxious.         Objective:   Physical Exam  Constitutional: She appears well-developed and well-nourished. No distress.  Blood pressure 160/90  Eyes: Conjunctivae and EOM are normal. Pupils are equal, round, and reactive to light. Right eye exhibits no discharge. Left eye exhibits no discharge. No scleral icterus.  20/40 os 20/30 od           Assessment & Plan:   Diminished visual acuity left eye.  This has been present for at least 2 weeks. She will be leaving for [facility] in 2 weeks for a minimum of 6 months today. She does have a local ophthalmologist that she would like to see.  Will place on 81 mg of aspirin and observe. She will call if she develops any worsening symptoms. Otherwise we'll be at evaluated by her ophthalmologist in [facility] Hypertension. Patient had nice blood pressure control as an inpatient on present medical regimen;  will observe on present treatment.

## 2012-07-01 NOTE — Patient Instructions (Signed)
Call or return to clinic prn if these symptoms worsen or fail to improve as anticipated.  Followup with your ophthalmologist as soon as possible  Take aspirin 81 mg daily

## 2012-07-01 NOTE — Telephone Encounter (Signed)
Appointment scheduled for today at 4:30

## 2012-08-04 DIAGNOSIS — R471 Dysarthria and anarthria: Secondary | ICD-10-CM | POA: Diagnosis not present

## 2012-08-04 DIAGNOSIS — R1310 Dysphagia, unspecified: Secondary | ICD-10-CM | POA: Diagnosis not present

## 2012-08-04 DIAGNOSIS — H5460 Unqualified visual loss, one eye, unspecified: Secondary | ICD-10-CM | POA: Diagnosis not present

## 2012-08-09 DIAGNOSIS — F341 Dysthymic disorder: Secondary | ICD-10-CM | POA: Diagnosis not present

## 2012-08-11 DIAGNOSIS — R1313 Dysphagia, pharyngeal phase: Secondary | ICD-10-CM | POA: Diagnosis not present

## 2012-08-16 DIAGNOSIS — Z961 Presence of intraocular lens: Secondary | ICD-10-CM | POA: Diagnosis not present

## 2012-08-16 DIAGNOSIS — H269 Unspecified cataract: Secondary | ICD-10-CM | POA: Diagnosis not present

## 2012-08-16 DIAGNOSIS — R1313 Dysphagia, pharyngeal phase: Secondary | ICD-10-CM | POA: Diagnosis not present

## 2012-08-16 DIAGNOSIS — H2640 Unspecified secondary cataract: Secondary | ICD-10-CM | POA: Diagnosis not present

## 2012-08-23 DIAGNOSIS — F341 Dysthymic disorder: Secondary | ICD-10-CM | POA: Diagnosis not present

## 2012-08-24 DIAGNOSIS — H26499 Other secondary cataract, unspecified eye: Secondary | ICD-10-CM | POA: Diagnosis not present

## 2012-08-30 DIAGNOSIS — R1313 Dysphagia, pharyngeal phase: Secondary | ICD-10-CM | POA: Diagnosis not present

## 2012-09-06 DIAGNOSIS — R1313 Dysphagia, pharyngeal phase: Secondary | ICD-10-CM | POA: Diagnosis not present

## 2012-09-28 DIAGNOSIS — I10 Essential (primary) hypertension: Secondary | ICD-10-CM | POA: Diagnosis not present

## 2012-09-28 DIAGNOSIS — Z0000 Encounter for general adult medical examination without abnormal findings: Secondary | ICD-10-CM | POA: Diagnosis not present

## 2012-09-28 DIAGNOSIS — M810 Age-related osteoporosis without current pathological fracture: Secondary | ICD-10-CM | POA: Diagnosis not present

## 2012-09-29 ENCOUNTER — Other Ambulatory Visit: Payer: Self-pay

## 2012-10-12 DIAGNOSIS — L821 Other seborrheic keratosis: Secondary | ICD-10-CM | POA: Diagnosis not present

## 2012-10-12 DIAGNOSIS — L820 Inflamed seborrheic keratosis: Secondary | ICD-10-CM | POA: Diagnosis not present

## 2012-10-21 DIAGNOSIS — M810 Age-related osteoporosis without current pathological fracture: Secondary | ICD-10-CM | POA: Diagnosis not present

## 2012-11-12 DIAGNOSIS — Z23 Encounter for immunization: Secondary | ICD-10-CM | POA: Diagnosis not present

## 2012-11-22 DIAGNOSIS — N390 Urinary tract infection, site not specified: Secondary | ICD-10-CM | POA: Diagnosis not present

## 2012-12-02 ENCOUNTER — Ambulatory Visit (INDEPENDENT_AMBULATORY_CARE_PROVIDER_SITE_OTHER): Payer: Medicare Other | Admitting: Emergency Medicine

## 2012-12-02 VITALS — BP 138/90 | HR 105 | Temp 99.4°F | Resp 17 | Ht 61.0 in | Wt 127.0 lb

## 2012-12-02 DIAGNOSIS — R1310 Dysphagia, unspecified: Secondary | ICD-10-CM

## 2012-12-02 NOTE — Progress Notes (Signed)
[facility] [address], [place] [address] [phone]  Date:  12/02/2012   Name:  Savannah Diaz   DOB:  Jun 08, 1932   MRN:  [id number]  PCP:  Rogelia Boga, MD    Chief Complaint: does not do well with pills may have pill stuck in throat th   History of Present Illness:  Savannah Diaz is a 79 y.o. very pleasant female patient who presents with the following:  Taking a macrobid and had ground it up and had difficulty swallowing.  Began to produce nasal drainage and clearing her throat.  Had similar episode in March and was admitted three days and underwent extensive workup and was found to have dysphagia.  Never has been able to swallow pills.  Eats and drinks normally. No respiratory distress, wheezing or shortness of breath.  No orthopnea or DOE.  Symptoms improved in office.  No improvement with over the counter medications or other home remedies. Denies other complaint or health concern today.   Patient Active Problem List   Diagnosis Date Noted  . Dehydration with hypernatremia 05/13/2012  . Hypokalemia 05/12/2012  . Dysphagia, unspecified 05/10/2012  . Hypertension 04/28/2011  . Osteoarthritis 04/28/2011  . Agoraphobia 04/28/2011    Past Medical History  Diagnosis Date  . Hypertension   . Anxiety   . Cataract     left  . Depression   . GERD (gastroesophageal reflux disease)   . Osteoporosis   . Dysphagia     "have had it for 7 yr; got worse 4 days ago" (05/12/2012)  . Migraines     "menopause cured them" (05/12/2012)  . Arthritis     "qwhere; but it doesn't hurt" (05/12/2012)    Past Surgical History  Procedure Laterality Date  . Cataract extraction w/ intraocular lens implant Left ? 2010  . Esophagogastroduodenoscopy N/A 05/10/2012    Procedure: ESOPHAGOGASTRODUODENOSCOPY (EGD);  Surgeon: Beverley Fiedler, MD;  Location: [facility];  Service: Gastroenterology;  Laterality: N/A;  . Tonsillectomy  1940's    History  Substance  Use Topics  . Smoking status: Never Smoker   . Smokeless tobacco: Never Used  . Alcohol Use: No     Comment: 05/12/2012 "have a drink hardly ever anymore"    Family History  Problem Relation Age of Onset  . Arthritis Mother   . Hypertension Mother   . Dementia Mother   . Cancer Father     colon     Allergies  Allergen Reactions  . Lactose Intolerance (Gi)   . Sulfa Antibiotics Hives    Childhood allergy     Medication list has been reviewed and updated.  Current Outpatient Prescriptions on File Prior to Visit  Medication Sig Dispense Refill  . amitriptyline (ELAVIL) 50 MG tablet Take 225 mg by mouth at bedtime.       Marland Kitchen atenolol (TENORMIN) 50 MG tablet Take 1.5 tablets (75 mg total) by mouth daily.  180 tablet  4  . diazepam (VALIUM) 5 MG tablet Take 1 tablet (5 mg total) by mouth every 8 (eight) hours as needed.  60 tablet  4  . lisinopril (PRINIVIL,ZESTRIL) 40 MG tablet Take 40 mg by mouth daily.      . feeding supplement (ENSURE COMPLETE) LIQD Take 237 mLs by mouth 3 (three) times daily between meals.       No current facility-administered medications on file prior to visit.    Review of Systems:  As per HPI, otherwise  negative.    Physical Examination: Filed Vitals:   12/02/12 1618  BP: 138/90  Pulse: 105  Temp: 99.4 F (37.4 C)  Resp: 17   Filed Vitals:   12/02/12 1618  Height: 5\' 1"  (1.549 m)  Weight: 127 lb (57.607 kg)   Body mass index is 24.01 kg/(m^2). Ideal Body Weight: Weight in (lb) to have BMI = 25: 132  GEN: WDWN, NAD, Non-toxic, A & O x 3 HEENT: Atraumatic, Normocephalic. Neck supple. No masses, No LAD. Ears and Nose: No external deformity. CV: RRR, No M/G/R. No JVD. No thrill. No extra heart sounds. PULM: CTA B, no wheezes, crackles, rhonchi. No retractions. No resp. distress. No accessory muscle use. ABD: S, NT, ND, +BS. No rebound. No HSM. EXTR: No c/c/e NEURO Normal gait.  PSYCH: Normally interactive. Conversant. Not depressed or  anxious appearing.  Calm demeanor.    Assessment and Plan: Dysphagia Follow up as needed   Signed,  Phillips Odor, MD

## 2012-12-02 NOTE — Patient Instructions (Signed)
Dysphagia  Swallowing problems (dysphagia) occur when solids and liquids seem to stick in your throat on the way down to your stomach, or the food takes longer to get to the stomach. Other symptoms (problems) include regurgitating (burping) up food, noises coming from the throat, chest discomfort with swallowing, and a feeling of fullness in the throat when swallowing. When blockage in the throat is complete it may be associated with drooling.  CAUSES  There are many causes of swallowing difficulties and the following is generalized information regarding a number of reasons for this problem. Problems with swallowing may occur because of problems with the muscles. The food cannot be propelled in the usual manner into the stomach. There may be ulcers, scar tissue, or inflammation (soreness) in the esophagus (the food tube from the mouth to the stomach) which blocks food from passing normally into the stomach. Causes of inflammation include acid reflux from the stomach into the esophagus. Inflammation can also be caused by the herpes simplex virus, Candida (yeast), radiation (as with treatment of cancer), or inflammation from medicines not taken with adequate fluids to wash them down into the stomach. There may be nerve problems so signals cannot be sent adequately telling the muscles of the esophagus to contract and move the food along. Achalasia is a rare disorder of the esophagus in which muscular contractions of the esophagus are uncoordinated. Globus hystericus is a relatively common problem in young females in which there is a sense of an obstruction or difficulty in swallowing, but in which no abnormalities can be found. This problem usually improves over time with reassurance and testing to rule out other causes.  DIAGNOSIS   A number of tests will help your caregiver know what is the cause of your swallowing problems. These tests may include a barium swallow in which X-rays are taken while you are drinking a liquid that outlines the lining of the esophagus on X-ray. If the stomach and small bowel are also studied in this manner it is called an upper gastrointestinal exam (UGI). Endoscopy may be done in which your caregiver examines your throat, esophagus, stomach, and small bowel with a small, flexible scope. Motility studies which measure the effectiveness and coordination of the muscular contractions of the esophagus may also be done.  TREATMENT  The treatment of swallowing problems are many, varying from medicines to surgical treatment. The treatment varies with the type of problem found. Your caregiver will discuss your results and treatment with you. If swallowing problems are severe the long-term problems which may occur include: malnutrition, pneumonia (from food going into the breathing tubes called trachea and bronchi), and an increase in tumors (lumps) of the esophagus.  SEEK IMMEDIATE MEDICAL CARE IF:   Food or another object becomes lodged in your throat or esophagus and will not move.  Document Released: 02/08/2000 Document Revised: 08/12/2011 Document Reviewed: 09/29/2007  [employer] Patient Information 2014 [employer]

## 2012-12-30 ENCOUNTER — Other Ambulatory Visit: Payer: Self-pay

## 2013-06-06 ENCOUNTER — Ambulatory Visit (INDEPENDENT_AMBULATORY_CARE_PROVIDER_SITE_OTHER): Payer: Medicare Other | Admitting: Internal Medicine

## 2013-06-06 ENCOUNTER — Telehealth: Payer: Self-pay | Admitting: Internal Medicine

## 2013-06-06 ENCOUNTER — Encounter: Payer: Self-pay | Admitting: Internal Medicine

## 2013-06-06 VITALS — BP 150/90 | HR 80 | Temp 98.9°F | Resp 20 | Ht 61.0 in | Wt 133.0 lb

## 2013-06-06 DIAGNOSIS — I10 Essential (primary) hypertension: Secondary | ICD-10-CM

## 2013-06-06 DIAGNOSIS — M1990 Unspecified osteoarthritis, unspecified site: Secondary | ICD-10-CM

## 2013-06-06 DIAGNOSIS — T148XXA Other injury of unspecified body region, initial encounter: Secondary | ICD-10-CM | POA: Diagnosis not present

## 2013-06-06 NOTE — Progress Notes (Signed)
Pre-visit discussion using our clinic review tool. No additional management support is needed unless otherwise documented below in the visit note.  

## 2013-06-06 NOTE — Progress Notes (Signed)
Subjective:    Patient ID: Savannah Diaz, female    DOB: 1933/02/08, 80 y.o.   MRN: [id number]  HPI  80 year old patient who was seen one day after a fall at the [facility].  She complains of pain over the buttock area as well as the right anterior lower leg. She has been  ambulatory and pain is described as room minimal.  Past Medical History  Diagnosis Date  . Hypertension   . Anxiety   . Cataract     left  . Depression   . GERD (gastroesophageal reflux disease)   . Osteoporosis   . Dysphagia     "have had it for 7 yr; got worse 4 days ago" (05/12/2012)  . Migraines     "menopause cured them" (05/12/2012)  . Arthritis     "qwhere; but it doesn't hurt" (05/12/2012)    History   Social History  . Marital Status: Widowed    Spouse Name: N/A    Number of Children: N/A  . Years of Education: N/A   Occupational History  . Not on file.   Social History Main Topics  . Smoking status: Never Smoker   . Smokeless tobacco: Never Used  . Alcohol Use: No     Comment: 05/12/2012 "have a drink hardly ever anymore"  . Drug Use: No  . Sexual Activity: No   Other Topics Concern  . Not on file   Social History Narrative  . No narrative on file    Past Surgical History  Procedure Laterality Date  . Cataract extraction w/ intraocular lens implant Left ? 2010  . Esophagogastroduodenoscopy N/A 05/10/2012    Procedure: ESOPHAGOGASTRODUODENOSCOPY (EGD);  Surgeon: Jerene Bears, MD;  Location: [facility];  Service: Gastroenterology;  Laterality: N/A;  . Tonsillectomy  1940's    Family History  Problem Relation Age of Onset  . Arthritis Mother   . Hypertension Mother   . Dementia Mother   . Cancer Father     colon     Allergies  Allergen Reactions  . Lactose Intolerance (Gi)   . Sulfa Antibiotics Hives    Childhood allergy     Current Outpatient Prescriptions on File Prior to Visit  Medication Sig Dispense Refill  . amitriptyline (ELAVIL) 50 MG tablet Take 225 mg by  mouth at bedtime.       Marland Kitchen atenolol (TENORMIN) 50 MG tablet Take 1.5 tablets (75 mg total) by mouth daily.  180 tablet  4  . diazepam (VALIUM) 5 MG tablet Take 1 tablet (5 mg total) by mouth every 8 (eight) hours as needed.  60 tablet  4  . feeding supplement (ENSURE COMPLETE) LIQD Take 237 mLs by mouth 3 (three) times daily between meals.      Marland Kitchen lisinopril (PRINIVIL,ZESTRIL) 40 MG tablet Take 40 mg by mouth daily.       No current facility-administered medications on file prior to visit.    BP 150/90  Pulse 80  Temp(Src) 98.9 F (37.2 C) (Oral)  Resp 20  Ht 5\' 1"  (1.549 m)  Wt 133 lb (60.328 kg)  BMI 25.14 kg/m2     Review of Systems  Constitutional: Negative.   HENT: Negative for congestion, dental problem, hearing loss, rhinorrhea, sinus pressure, sore throat and tinnitus.   Eyes: Negative for pain, discharge and visual disturbance.  Respiratory: Negative for cough and shortness of breath.   Cardiovascular: Negative for chest pain, palpitations and leg swelling.  Gastrointestinal: Negative for nausea, vomiting, abdominal  pain, diarrhea, constipation, blood in stool and abdominal distention.  Genitourinary: Negative for dysuria, urgency, frequency, hematuria, flank pain, vaginal bleeding, vaginal discharge, difficulty urinating, vaginal pain and pelvic pain.  Musculoskeletal: Negative for arthralgias, gait problem and joint swelling. Myalgias: buttock and left leg pain.  Skin: Negative for rash.  Neurological: Negative for dizziness, syncope, speech difficulty, weakness, numbness and headaches.  Hematological: Negative for adenopathy.  Psychiatric/Behavioral: Negative for behavioral problems, dysphoric mood and agitation. The patient is not nervous/anxious.        Objective:   Physical Exam  Constitutional: She appears well-developed and well-nourished. No distress.  Musculoskeletal:  Negative straight leg test. Ambulatory without difficulty Normal flexion of both hips    Skin:  Resolving ecchymoses over the left anterior lower leg          Assessment & Plan:   Soft tissue trauma after fall.  We'll moderate her activities and use Tylenol, Aleve, as needed Hypertension stable

## 2013-06-06 NOTE — Telephone Encounter (Signed)
Patient Information:  Caller Name: Natosha  Phone: [phone]  Patient: Savannah Diaz, Savannah Diaz  Gender: Female  DOB: [birth year]  Age: 80 Years  PCP: Bluford Kaufmann [facility])  Office Follow Up:  Does the office need to follow up with this patient?: No  Instructions For The Office: N/A  RN Note:  Triaged per Falls Guideline in [facility]; See in 2 weeks due to at risk for recurrent falls (due to age) per nursing judgement encouraged to be seen today or tomorrow.  Pt feels that she doesn't need to be seen; she feels fine except  for some aches with movments but able to do ADL's; agrees to an appt for today if possible  Symptoms  Reason For Call & Symptoms: Pt is calling and states that she was walking down the steps and she fell; incident occurred 06/05/2013;  she doesn't know how she fell; denies blacking out; bruise noted to right shin; this is her only injury; she is feeling aches when she gets up and moves around; took Aleve for pain and Valium; she slept well last night;  able to get up and walk around today;  making breakfast without any issues; pt talking slow on the phone but denies any change in speech; denies neuro sx  Reviewed Health History In EMR: Yes  Reviewed Medications In EMR: Yes  Reviewed Allergies In EMR: Yes  Reviewed Surgeries / Procedures: Yes  Date of Onset of Symptoms: 06/05/2013  Treatments Tried: Aleve and Valium  Treatments Tried Worked: No  Guideline(s) Used:  No Protocol Available - Sick Adult  Disposition Per Guideline:   See Within 3 Days in Office  Reason For Disposition Reached:   Nursing judgment  Advice Given:  N/A  Patient Will Follow Care Advice:  YES  Appointment Scheduled:  06/06/2013 14:15:00 Appointment Scheduled Provider:  Bluford Kaufmann [facility])

## 2013-06-06 NOTE — Patient Instructions (Signed)
You  may move around, but avoid painful motions and activities.  Apply ice to the sore area for 15 to 20 minutes 3 or 4 times daily for the next two to 3 days.  Call or return to clinic prn if these symptoms worsen or fail to improve as anticipated.  

## 2013-06-22 ENCOUNTER — Encounter: Payer: Self-pay | Admitting: Sports Medicine

## 2013-06-22 ENCOUNTER — Ambulatory Visit (INDEPENDENT_AMBULATORY_CARE_PROVIDER_SITE_OTHER): Payer: Medicare Other | Admitting: Sports Medicine

## 2013-06-22 VITALS — BP 161/94 | Ht 61.0 in | Wt 128.0 lb

## 2013-06-22 DIAGNOSIS — IMO0001 Reserved for inherently not codable concepts without codable children: Secondary | ICD-10-CM | POA: Diagnosis not present

## 2013-06-22 DIAGNOSIS — T148XXA Other injury of unspecified body region, initial encounter: Secondary | ICD-10-CM | POA: Diagnosis not present

## 2013-06-22 DIAGNOSIS — W108XXA Fall (on) (from) other stairs and steps, initial encounter: Secondary | ICD-10-CM

## 2013-06-22 DIAGNOSIS — M7918 Myalgia, other site: Secondary | ICD-10-CM

## 2013-06-22 NOTE — Progress Notes (Signed)
CC: Bilateral gluteal pain HPI: Patient is a 80 year old female who presents for evaluation of bilateral gluteal pain that started after her fall. She was at the [facility] about 2 weeks ago and fell down some steps. She landed on her buttocks. She initially thought she was okay but had worsening pain the next day. Her gluteus bilaterally remains achy. She does note that it improves after walking. She states that she has a low back program that she does chronically that has helped her tremendously with her low back pain. She has not been doing this out of concern that this will worsen her current injury. She is also concerned about how long it is taking for her to get better. She does feel like she is improving some.  ROS: As above in the HPI. All other systems are stable or negative.  OBJECTIVE: APPEARANCE:  Patient in no acute distress.The patient appeared well nourished and normally developed. HEENT: No scleral icterus. Conjunctiva non-injected Resp: Non labored Skin: No rash MSK:  Low back exam: - Full range of motion in flexion, extension, lateral bending, rotation without pain  - No tenderness to palpation over the spinous processes of the lumbar vertebra - No tenderness to palpation at the SI joint - Strength 5 out of 5 in the bilateral lower extremities Bilateral hips: - Full range of motion in internal and external rotation without pain.  She is able to climb onto and off of the examination table without pain  MSK [place]: Not performed   ASSESSMENT: #1. Bilateral gluteal contusions   PLAN: Offered reassurance. Explain it may take up to 2-3 months for this to completely resolve. She may continue to take the Aleve and crush it as needed. I encouraged her to resume her low back exercise program. If there are any exercises that her her too badly she may hold off on these and try again in a couple weeks. Recommended that she started daily walking program at 10 minutes per day and adding  5 minutes per day every week. We will see her back as needed if she is not improving.

## 2013-06-22 NOTE — Patient Instructions (Signed)
Thank you for coming in today  1. It is okay to crush aleve and take as needed 2. This may take 2-3 months to completely resolve. You should see small improvement every week. You may have good days and bad days. 3. Resume all low back exercises. If something causes too much pain, then stop. Try the exercise again in 2 weeks. 4. Start walking every day. This week walk 10 minutes per day. Add 5 minutes to your walk each week. Work up to 30 minutes of walking per day. Continue forever. 5. Don't worry. This will get better. Nothing is broken. If you're not doing better in one month come back and see [place].   Dr. Casimer Lanius Dr. Andreas Blower

## 2013-08-08 DIAGNOSIS — Z0000 Encounter for general adult medical examination without abnormal findings: Secondary | ICD-10-CM | POA: Diagnosis not present

## 2013-08-08 DIAGNOSIS — F329 Major depressive disorder, single episode, unspecified: Secondary | ICD-10-CM | POA: Diagnosis not present

## 2013-08-08 DIAGNOSIS — I10 Essential (primary) hypertension: Secondary | ICD-10-CM | POA: Diagnosis not present

## 2013-08-08 DIAGNOSIS — F3289 Other specified depressive episodes: Secondary | ICD-10-CM | POA: Diagnosis not present

## 2013-08-08 DIAGNOSIS — Z23 Encounter for immunization: Secondary | ICD-10-CM | POA: Diagnosis not present

## 2013-08-08 DIAGNOSIS — M810 Age-related osteoporosis without current pathological fracture: Secondary | ICD-10-CM | POA: Diagnosis not present

## 2013-08-15 DIAGNOSIS — Z961 Presence of intraocular lens: Secondary | ICD-10-CM | POA: Diagnosis not present

## 2013-08-15 DIAGNOSIS — H526 Other disorders of refraction: Secondary | ICD-10-CM | POA: Diagnosis not present

## 2013-08-15 DIAGNOSIS — H269 Unspecified cataract: Secondary | ICD-10-CM | POA: Diagnosis not present

## 2013-08-15 DIAGNOSIS — Z8679 Personal history of other diseases of the circulatory system: Secondary | ICD-10-CM | POA: Diagnosis not present

## 2013-08-15 DIAGNOSIS — H3589 Other specified retinal disorders: Secondary | ICD-10-CM | POA: Diagnosis not present

## 2013-08-18 DIAGNOSIS — L821 Other seborrheic keratosis: Secondary | ICD-10-CM | POA: Diagnosis not present

## 2013-10-07 DIAGNOSIS — F341 Dysthymic disorder: Secondary | ICD-10-CM | POA: Diagnosis not present

## 2013-10-27 DIAGNOSIS — Z79899 Other long term (current) drug therapy: Secondary | ICD-10-CM | POA: Diagnosis not present

## 2013-11-14 DIAGNOSIS — F341 Dysthymic disorder: Secondary | ICD-10-CM | POA: Diagnosis not present

## 2014-03-16 ENCOUNTER — Encounter: Payer: Self-pay | Admitting: Internal Medicine

## 2014-03-16 ENCOUNTER — Telehealth: Payer: Self-pay

## 2014-03-16 ENCOUNTER — Ambulatory Visit (INDEPENDENT_AMBULATORY_CARE_PROVIDER_SITE_OTHER): Payer: Medicare Other | Admitting: Internal Medicine

## 2014-03-16 VITALS — BP 142/90 | HR 85 | Temp 97.7°F | Resp 18 | Ht 61.0 in | Wt 133.0 lb

## 2014-03-16 DIAGNOSIS — M150 Primary generalized (osteo)arthritis: Secondary | ICD-10-CM | POA: Diagnosis not present

## 2014-03-16 DIAGNOSIS — M159 Polyosteoarthritis, unspecified: Secondary | ICD-10-CM

## 2014-03-16 DIAGNOSIS — I10 Essential (primary) hypertension: Secondary | ICD-10-CM

## 2014-03-16 DIAGNOSIS — N390 Urinary tract infection, site not specified: Secondary | ICD-10-CM | POA: Diagnosis not present

## 2014-03-16 MED ORDER — CIPROFLOXACIN HCL 500 MG PO TABS: 500.0000 mg | ORAL_TABLET | Freq: Two times a day (BID) | ORAL | Status: DC

## 2014-03-16 NOTE — Progress Notes (Signed)
Subjective:    Patient ID: Savannah Diaz, female    DOB: 28-Jul-1932, 81 y.o.   MRN: [id number]  HPI  BP Readings from Last 3 Encounters:  03/16/14 142/90  06/22/13 161/94  06/06/13 42/60   81 year old patient who presents with a 2 to three-day history of cloudy urine and some mild dysuria.  She noted some brief hematuria.  She has had rare bladder infections over the years.  She has treated hypertension and history of osteoarthritis and is seen not infrequently.  She spends 6 months of each year in [place] and has annual exams.  There  Past Medical History  Diagnosis Date  . Hypertension   . Anxiety   . Cataract     left  . Depression   . GERD (gastroesophageal reflux disease)   . Osteoporosis   . Dysphagia     "have had it for 7 yr; got worse 4 days ago" (05/12/2012)  . Migraines     "menopause cured them" (05/12/2012)  . Arthritis     "qwhere; but it doesn't hurt" (05/12/2012)    History   Social History  . Marital Status: Widowed    Spouse Name: N/A    Number of Children: N/A  . Years of Education: N/A   Occupational History  . Not on file.   Social History Main Topics  . Smoking status: Never Smoker   . Smokeless tobacco: Never Used  . Alcohol Use: No     Comment: 05/12/2012 "have a drink hardly ever anymore"  . Drug Use: No  . Sexual Activity: No   Other Topics Concern  . Not on file   Social History Narrative    Past Surgical History  Procedure Laterality Date  . Cataract extraction w/ intraocular lens implant Left ? 2010  . Esophagogastroduodenoscopy N/A 05/10/2012    Procedure: ESOPHAGOGASTRODUODENOSCOPY (EGD);  Surgeon: Jerene Bears, MD;  Location: [facility];  Service: Gastroenterology;  Laterality: N/A;  . Tonsillectomy  1940's    Family History  Problem Relation Age of Onset  . Arthritis Mother   . Hypertension Mother   . Dementia Mother   . Cancer Father     colon     Allergies  Allergen Reactions  . Lactose Intolerance  (Gi)   . Sulfa Antibiotics Hives    Childhood allergy     Current Outpatient Prescriptions on File Prior to Visit  Medication Sig Dispense Refill  . amitriptyline (ELAVIL) 50 MG tablet Take 225 mg by mouth at bedtime.     Marland Kitchen atenolol (TENORMIN) 50 MG tablet Take 1.5 tablets (75 mg total) by mouth daily. 180 tablet 4  . diazepam (VALIUM) 5 MG tablet Take 1 tablet (5 mg total) by mouth every 8 (eight) hours as needed. 60 tablet 4  . feeding supplement (ENSURE COMPLETE) LIQD Take 237 mLs by mouth 3 (three) times daily between meals.    Marland Kitchen lisinopril (PRINIVIL,ZESTRIL) 40 MG tablet Take 40 mg by mouth daily.     No current facility-administered medications on file prior to visit.    BP 142/90 mmHg  Pulse 85  Temp(Src) 97.7 F (36.5 C) (Oral)  Resp 18  Ht 5\' 1"  (1.549 m)  Wt 133 lb (60.328 kg)  BMI 25.14 kg/m2  SpO2 95%      Review of Systems  Constitutional: Negative.   HENT: Negative for congestion, dental problem, hearing loss, rhinorrhea, sinus pressure, sore throat and tinnitus.   Eyes: Negative for pain, discharge and visual  disturbance.  Respiratory: Negative for cough and shortness of breath.   Cardiovascular: Negative for chest pain, palpitations and leg swelling.  Gastrointestinal: Negative for nausea, vomiting, abdominal pain, diarrhea, constipation, blood in stool and abdominal distention.  Genitourinary: Positive for dysuria and frequency. Negative for urgency, hematuria, flank pain, vaginal bleeding, vaginal discharge, difficulty urinating, vaginal pain and pelvic pain.  Musculoskeletal: Negative for joint swelling, arthralgias and gait problem.  Skin: Negative for rash.  Neurological: Negative for dizziness, syncope, speech difficulty, weakness, numbness and headaches.  Hematological: Negative for adenopathy.  Psychiatric/Behavioral: Negative for behavioral problems, dysphoric mood and agitation. The patient is not nervous/anxious.        Objective:   Physical  Exam  Constitutional: She appears well-developed and well-nourished. No distress.  Abdominal: Soft. Bowel sounds are normal. She exhibits no distension. There is no tenderness. There is no rebound.  No flank pain or suprapubic tenderness          Assessment & Plan:   UTI.  Will treat with Cipro Hypertension, stable.  Medications updated  Schedule CPX

## 2014-03-16 NOTE — Progress Notes (Signed)
Pre visit review using our clinic review tool, if applicable. No additional management support is needed unless otherwise documented below in the visit note. 

## 2014-03-16 NOTE — Telephone Encounter (Signed)
[facility] Night - Client TELEPHONE [facility] Patient Name: Savannah Diaz Gender: Female DOB: Feb 27, 1932 Age: 81 Y 2 M 27 D Return Phone Number: [phone] (Primary), [phone] (Secondary) Address: [place]/State/Zip: [place] [address] Client [facility] Night - Client Client Site [facility] - Night Physician Simonne Martinet Contact Type Call Call Type Triage / Clinical Caller Name Loralyn Rachel Relationship To Patient Daughter Return Phone Number [phone] (Primary) Chief Complaint Urine, Blood In Initial Comment Caller states mother has slight cramping, blood in urine PreDisposition Call Doctor Nurse Assessment Nurse: Arnoldo Morale, RN, Shelton Silvas Date/Time Eilene Ghazi Time): 03/15/2014 6:42:20 PM Confirm and document reason for call. If symptomatic, describe symptoms. ---Caller states mother has slight cramping, blood in urine. No fever. Denies burning with urination. No back pain or side pain. Has the patient traveled out of the country within the last 30 days? ---Not Applicable Does the patient require triage? ---Yes Related visit to physician within the last 2 weeks? ---No Does the PT have any chronic conditions? (i.e. diabetes, asthma, etc.) ---Yes List chronic conditions. ---HTN, Depression Guidelines Guideline Title Affirmed Question Affirmed Notes Nurse Date/Time (Eastern Time) Urine - Blood In Blood in urine, but all triage questions negative (Exception: could be normal menstrual bleeding) Arnoldo Morale, RN, Shelton Silvas 03/15/2014 6:42:40 PM Disp. Time Eilene Ghazi Time) Disposition Final User 03/15/2014 6:47:40 PM See Physician within 24 Hours Yes Arnoldo Morale, RN, Marga Melnick Understands: Yes Disagree/Comply: Comply PLEASE NOTE: All timestamps contained within this report are represented as [place] Standard Time. CONFIDENTIALTY NOTICE: This fax transmission is  intended only for the addressee. It contains information that is legally privileged, confidential or otherwise protected from use or disclosure. If you are not the intended recipient, you are strictly prohibited from reviewing, disclosing, copying using or disseminating any of this information or taking any action in reliance on or regarding this information. If you have received this fax in error, please notify us immediately by telephone so that we can arrange for its return to [place]. Phone: [phone], Toll-Free: [phone], Fax: [phone] Page: 2 of 2 Call Id: [id number] Care Advice Given Per Guideline SEE PHYSICIAN WITHIN 24 HOURS: SAMPLE: Bring in a sample of the bloody urine. Keep it in the refrigerator until you leave. CALL BACK IF: * Fever or pain occurs * You become worse. CARE ADVICE given per Urine, Blood In (Adult) guideline. After Care Instructions Given Call Event Type User Date / Time Description Referrals REFERRED TO PCP OFFICE  Pt scheduled for an appt on 1.21.2016

## 2014-03-16 NOTE — Patient Instructions (Signed)
Take your antibiotic as prescribed until ALL of it is gone, but stop if you develop a rash, swelling, or any side effects of the medication.  Contact our office as soon as possible if  there are side effects of the medication.  Urinary Tract Infection Urinary tract infections (UTIs) can develop anywhere along your urinary tract. Your urinary tract is your body's drainage system for removing wastes and extra water. Your urinary tract includes two kidneys, two ureters, a bladder, and a urethra. Your kidneys are a pair of bean-shaped organs. Each kidney is about the size of your fist. They are located below your ribs, one on each side of your spine. CAUSES Infections are caused by microbes, which are microscopic organisms, including fungi, viruses, and bacteria. These organisms are so small that they can only be seen through a microscope. Bacteria are the microbes that most commonly cause UTIs. SYMPTOMS  Symptoms of UTIs may vary by age and gender of the patient and by the location of the infection. Symptoms in young women typically include a frequent and intense urge to urinate and a painful, burning feeling in the bladder or urethra during urination. Older women and men are more likely to be tired, shaky, and weak and have muscle aches and abdominal pain. A fever may mean the infection is in your kidneys. Other symptoms of a kidney infection include pain in your back or sides below the ribs, nausea, and vomiting. DIAGNOSIS To diagnose a UTI, your caregiver will ask you about your symptoms. Your caregiver also will ask to provide a urine sample. The urine sample will be tested for bacteria and white blood cells. White blood cells are made by your body to help fight infection. TREATMENT  Typically, UTIs can be treated with medication. Because most UTIs are caused by a bacterial infection, they usually can be treated with the use of antibiotics. The choice of antibiotic and length of treatment depend on your  symptoms and the type of bacteria causing your infection. HOME CARE INSTRUCTIONS  If you were prescribed antibiotics, take them exactly as your caregiver instructs you. Finish the medication even if you feel better after you have only taken some of the medication.  Drink enough water and fluids to keep your urine clear or pale yellow.  Avoid caffeine, tea, and carbonated beverages. They tend to irritate your bladder.  Empty your bladder often. Avoid holding urine for long periods of time.  Empty your bladder before and after sexual intercourse.  After a bowel movement, women should cleanse from front to back. Use each tissue only once. SEEK MEDICAL CARE IF:   You have back pain.  You develop a fever.  Your symptoms do not begin to resolve within 3 days. SEEK IMMEDIATE MEDICAL CARE IF:   You have severe back pain or lower abdominal pain.  You develop chills.  You have nausea or vomiting.  You have continued burning or discomfort with urination. MAKE SURE YOU:   Understand these instructions.  Will watch your condition.  Will get help right away if you are not doing well or get worse. Document Released: 11/20/2004 Document Revised: 08/12/2011 Document Reviewed: 03/21/2011 [facility] Patient Information 2015 [place], [place]. This information is not intended to replace advice given to you by your health care provider. Make sure you discuss any questions you have with your health care provider.

## 2014-03-20 ENCOUNTER — Telehealth: Payer: Self-pay | Admitting: Internal Medicine

## 2014-03-20 ENCOUNTER — Encounter: Payer: Self-pay | Admitting: Internal Medicine

## 2014-03-20 ENCOUNTER — Ambulatory Visit (INDEPENDENT_AMBULATORY_CARE_PROVIDER_SITE_OTHER): Payer: Medicare Other | Admitting: Internal Medicine

## 2014-03-20 VITALS — BP 150/90 | HR 103 | Temp 98.6°F | Resp 18 | Ht 61.0 in | Wt 123.0 lb

## 2014-03-20 DIAGNOSIS — I10 Essential (primary) hypertension: Secondary | ICD-10-CM

## 2014-03-20 DIAGNOSIS — R109 Unspecified abdominal pain: Secondary | ICD-10-CM | POA: Diagnosis not present

## 2014-03-20 DIAGNOSIS — M25559 Pain in unspecified hip: Secondary | ICD-10-CM

## 2014-03-20 NOTE — Telephone Encounter (Signed)
Patient Name: Savannah Diaz  DOB: 10/03/1932    Initial Comment Caller states she was seen on Thursday- DX- UTI. Cloudy urine is gone, she still has leg pain. Mild cramps. She is on antibiotics- Cipro-   Nurse Assessment  Nurse: Thad Ranger, RN, Denise Date/Time (Eastern Time): 03/20/2014 3:13:01 PM  Confirm and document reason for call. If symptomatic, describe symptoms. ---Caller states she was seen on Thursday- DX- UTI. Cloudy urine is gone, she still has leg pain. Mild cramps. She is on antibiotics- Cipro. States she had pain in the front of both thighs prior to being placed on Cipro 500mg  po bid x 5 days, and since she has on the Cipro, the pain remains the same in her legs. States she takes her last dose of Cipro this eve and continues to drink increased water amts. State she continues to have some dysuria and lower abd cramping. Allergies: Sulfa Pharmacy: [facility] [place], [phone]  Has the patient traveled out of the country within the last 30 days? ---Not Applicable  Does the patient require triage? ---Yes  Related visit to physician within the last 2 weeks? ---Yes  Does the PT have any chronic conditions? (i.e. diabetes, asthma, etc.) ---No     Guidelines    Guideline Title Affirmed Question Affirmed Notes  Urinary Tract Infection on Antibiotic Follow-up Call - Female [1] Reasonable improvement on antibiotics AND [2] no fever (all triage questions negative) Will call the on call MD w/pt report to see if abx need to be extended and to report the thigh/leg pain unchanged since prior to being placed on the abx. Advised pt will cb. Verb understanding.   Final Disposition User   [name], RN, [name]    Comments  UPGRADE TRIAGE OUTCOME FROM [facility] TO BE SEEN BY MD WITHIN 4 HRS PER PROFILE DIRECTIVE (CALL PCP NOW = PT TO BE SEEN WITHIN 4 HRS). Appt made for 03/20/14 with Dr Burnice Logan at [phone]. Called pt to make aware of appt made and she is agreeable with POC. Advised appt made d/t leg pain  and continued dysuria despite use of Cipro 500mg  po bid x 5 days. Verb understanding.

## 2014-03-20 NOTE — Addendum Note (Signed)
Addended by: Marian Sorrow on: 03/20/2014 05:09 PM   Modules accepted: Orders

## 2014-03-20 NOTE — Patient Instructions (Signed)
Complete antibiotic therapy  Call or return to clinic prn if these symptoms worsen or fail to improve as anticipated.

## 2014-03-20 NOTE — Progress Notes (Signed)
Pre visit review using our clinic review tool, if applicable. No additional management support is needed unless otherwise documented below in the visit note. 

## 2014-03-20 NOTE — Progress Notes (Signed)
Subjective:    Patient ID: Savannah Diaz, female    DOB: 1932-12-24, 81 y.o.   MRN: [id number]  HPI  81 year old patient who was seen recently and treated for a suspected UTI.  She presented with dysuria, cloudy urine, an episode of gross hematuria.  She was unable to provide a specimen and is completing 5 days of Cipro antibiotics.  She generally feels well.  Her chief complaint today is anterior thigh pain.  This is nonspecific and doesn't appear to have any aggravating factors.  Apparently this has been fairly chronic as well  Past Medical History  Diagnosis Date  . Hypertension   . Anxiety   . Cataract     left  . Depression   . GERD (gastroesophageal reflux disease)   . Osteoporosis   . Dysphagia     "have had it for 7 yr; got worse 4 days ago" (05/12/2012)  . Migraines     "menopause cured them" (05/12/2012)  . Arthritis     "qwhere; but it doesn't hurt" (05/12/2012)    History   Social History  . Marital Status: Widowed    Spouse Name: N/A    Number of Children: N/A  . Years of Education: N/A   Occupational History  . Not on file.   Social History Main Topics  . Smoking status: Never Smoker   . Smokeless tobacco: Never Used  . Alcohol Use: No     Comment: 05/12/2012 "have a drink hardly ever anymore"  . Drug Use: No  . Sexual Activity: No   Other Topics Concern  . Not on file   Social History Narrative    Past Surgical History  Procedure Laterality Date  . Cataract extraction w/ intraocular lens implant Left ? 2010  . Esophagogastroduodenoscopy N/A 05/10/2012    Procedure: ESOPHAGOGASTRODUODENOSCOPY (EGD);  Surgeon: Jerene Bears, MD;  Location: [facility];  Service: Gastroenterology;  Laterality: N/A;  . Tonsillectomy  1940's    Family History  Problem Relation Age of Onset  . Arthritis Mother   . Hypertension Mother   . Dementia Mother   . Cancer Father     colon     Allergies  Allergen Reactions  . Lactose Intolerance (Gi)   . Sulfa  Antibiotics Hives    Childhood allergy     Current Outpatient Prescriptions on File Prior to Visit  Medication Sig Dispense Refill  . amitriptyline (ELAVIL) 50 MG tablet Take 225 mg by mouth at bedtime.     Marland Kitchen atenolol (TENORMIN) 50 MG tablet Take 1.5 tablets (75 mg total) by mouth daily. 180 tablet 4  . ciprofloxacin (CIPRO) 500 MG tablet Take 1 tablet (500 mg total) by mouth 2 (two) times daily. 10 tablet 0  . diazepam (VALIUM) 5 MG tablet Take 1 tablet (5 mg total) by mouth every 8 (eight) hours as needed. 60 tablet 4  . feeding supplement (ENSURE COMPLETE) LIQD Take 237 mLs by mouth 3 (three) times daily between meals.    Marland Kitchen lisinopril (PRINIVIL,ZESTRIL) 40 MG tablet Take 40 mg by mouth daily.     No current facility-administered medications on file prior to visit.    BP 150/90 mmHg  Pulse 103  Temp(Src) 98.6 F (37 C) (Oral)  Resp 18  Ht 5\' 1"  (1.549 m)  Wt 123 lb (55.792 kg)  BMI 23.25 kg/m2  SpO2 96%     Review of Systems  Constitutional: Negative.   HENT: Negative for congestion, dental problem, hearing loss, rhinorrhea,  sinus pressure, sore throat and tinnitus.   Eyes: Negative for pain, discharge and visual disturbance.  Respiratory: Negative for cough and shortness of breath.   Cardiovascular: Negative for chest pain, palpitations and leg swelling.  Gastrointestinal: Negative for nausea, vomiting, abdominal pain, diarrhea, constipation, blood in stool and abdominal distention.  Genitourinary: Negative for dysuria, urgency, frequency, hematuria, flank pain, vaginal bleeding, vaginal discharge, difficulty urinating, vaginal pain and pelvic pain.  Musculoskeletal: Positive for myalgias. Negative for joint swelling, arthralgias and gait problem.  Skin: Negative for rash.  Neurological: Negative for dizziness, syncope, speech difficulty, weakness, numbness and headaches.  Hematological: Negative for adenopathy.  Psychiatric/Behavioral: Negative for behavioral problems,  dysphoric mood and agitation. The patient is not nervous/anxious.        Objective:   Physical Exam  Constitutional: She is oriented to person, place, and time. She appears well-developed and well-nourished.  HENT:  Head: Normocephalic.  Right Ear: External ear normal.  Left Ear: External ear normal.  Mouth/Throat: Oropharynx is clear and moist.  Eyes: Conjunctivae and EOM are normal. Pupils are equal, round, and reactive to light.  Neck: Normal range of motion. Neck supple. No thyromegaly present.  Cardiovascular: Normal rate, regular rhythm, normal heart sounds and intact distal pulses.   Pulmonary/Chest: Effort normal and breath sounds normal.  Abdominal: Soft. Bowel sounds are normal. She exhibits no mass. There is no tenderness.  Musculoskeletal: Normal range of motion. She exhibits no tenderness.  No pain, tenderness or swelling involving the anterior thigh regions.  No peripheral edema  Lymphadenopathy:    She has no cervical adenopathy.  Neurological: She is alert and oriented to person, place, and time.  Skin: Skin is warm and dry. No rash noted.  Psychiatric: She has a normal mood and affect. Her behavior is normal.          Assessment & Plan:   Status post UTI.  Patient unable to give a specimen today.  Will complete antibiotic therapy Nonspecific bilateral anterior thigh pain.  Unremarkable clinical exam heard.  We'll continue to observe Hypertension, stable

## 2014-04-12 IMAGING — RF DG ESOPHAGUS
14 of 22 series · 14 of 24 positions shown · non-contrast
Comparison: None.

CLINICAL DATA: Dysphagia.

ESOPHOGRAM/BARIUM SWALLOW
TECHNIQUE: Single contrast examination was performed using thin
barium.
Fluoroscopy time:  1 minute 39 seconds.

[Series 1: run · 1 of 33 slices shown (1 of 14)]
[im 1/33]
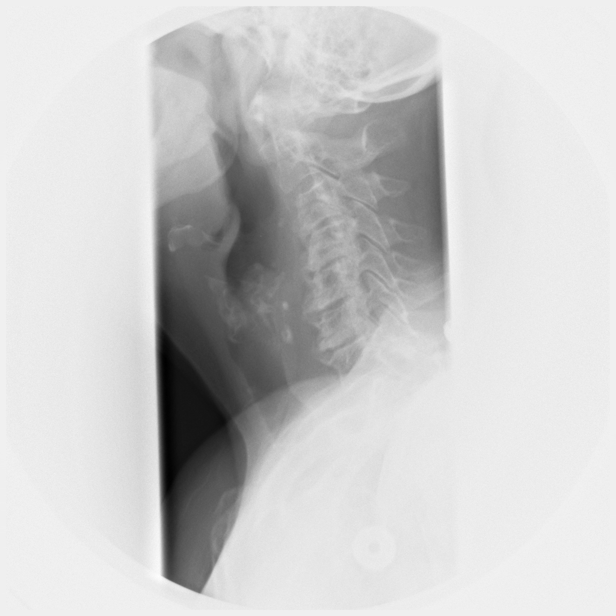

[Series 3: run · 1 of 1 slices shown (2 of 14)]
[im 1/1]
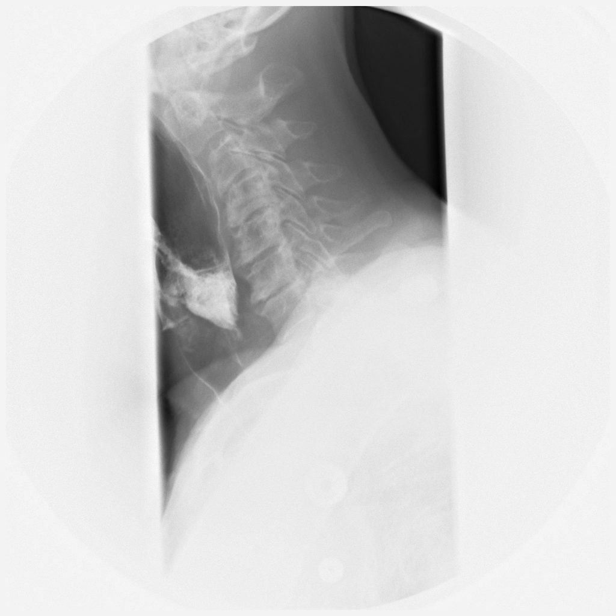

[Series 5: run · 1 of 9 slices shown (3 of 14)]
[im 1/9]
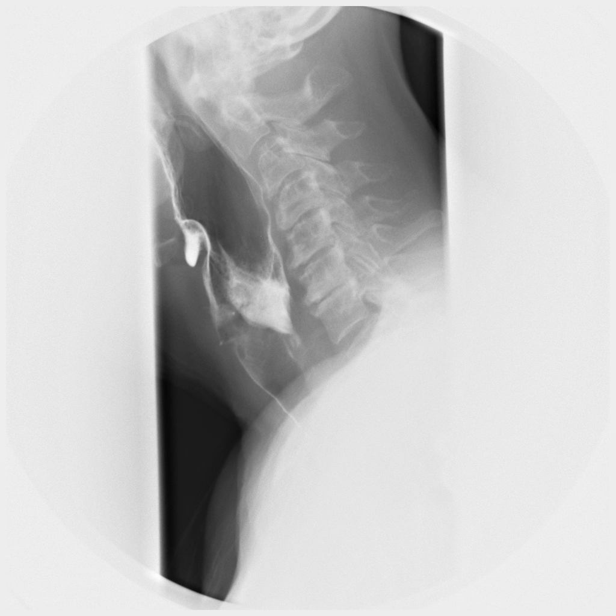

[Series 7: run · 1 of 14 slices shown (4 of 14)]
[im 1/14]
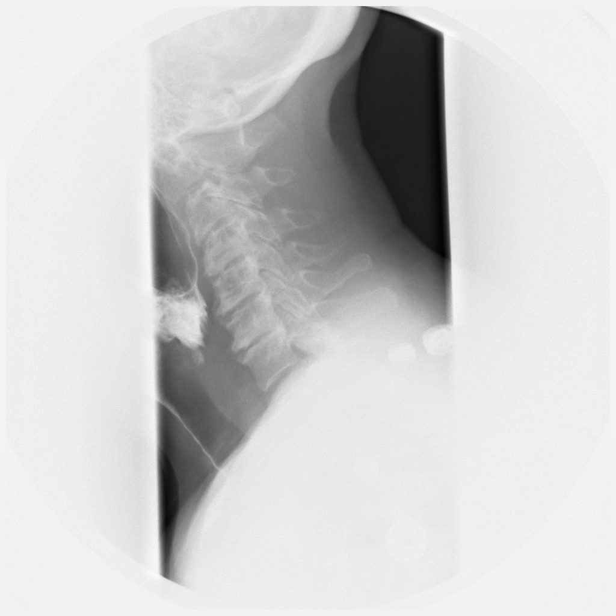

[Series 8: run · 1 of 7 slices shown (5 of 14)]
[im 1/7]
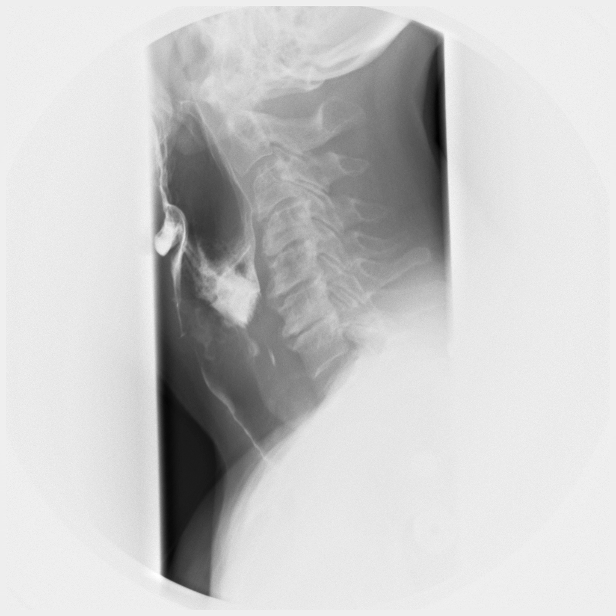

[Series 9: run · 1 of 63 slices shown (6 of 14)]
[im 32/63]
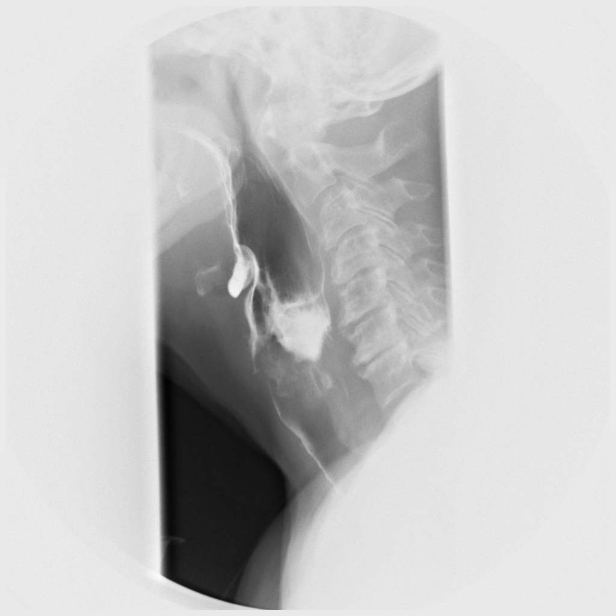

[Series 10: run · 1 of 1 slices shown (7 of 14)]
[im 1/1]
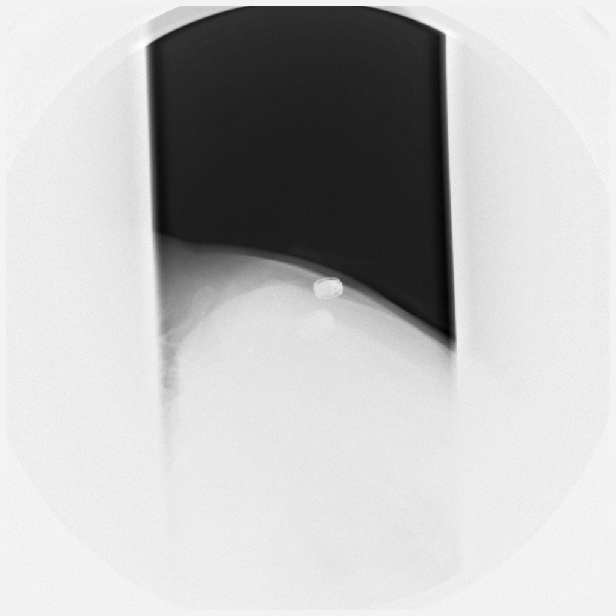

[Series 11: run · 1 of 1 slices shown (8 of 14)]
[im 1/1]
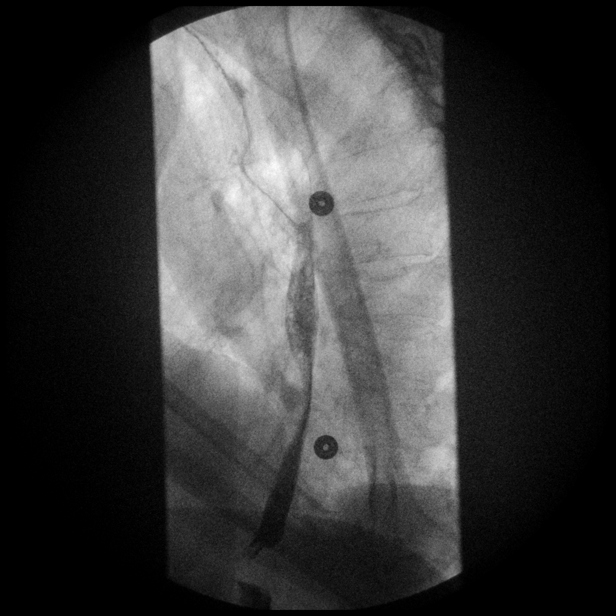

[Series 13: run · 1 of 1 slices shown (9 of 14)]
[im 1/1]
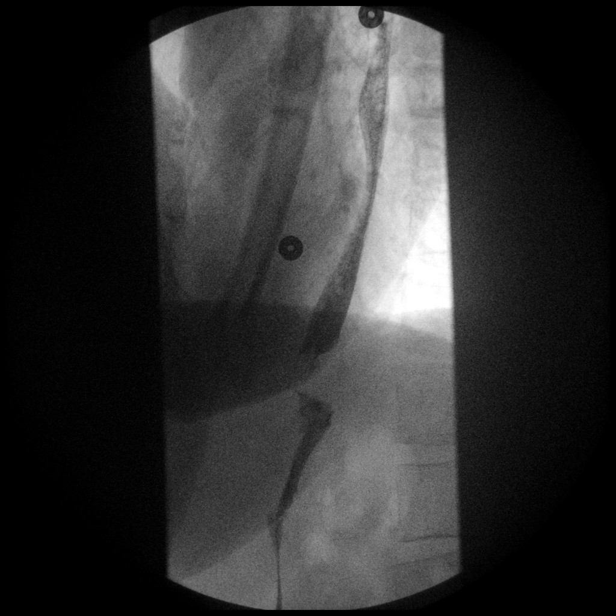

[Series 15: run · 1 of 1 slices shown (10 of 14)]
[im 1/1]
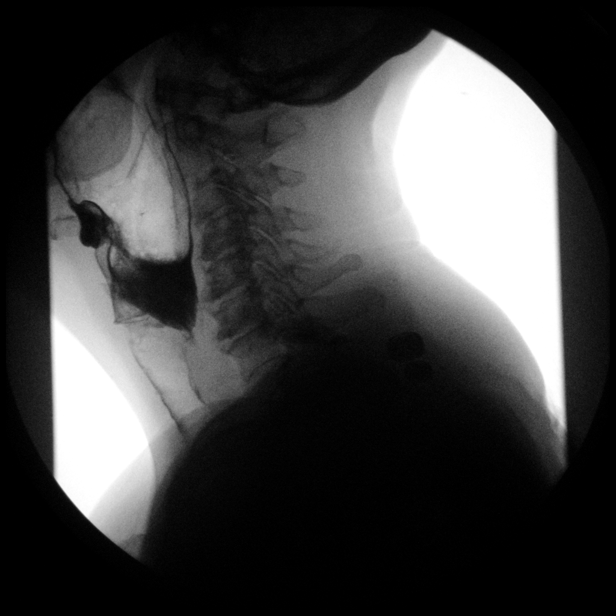

[Series 17: run · 1 of 1 slices shown (11 of 14)]
[im 1/1]
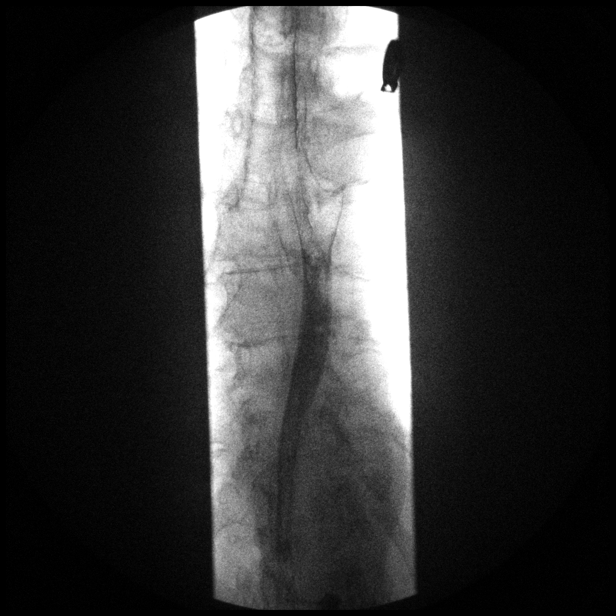

[Series 18: run · 1 of 1 slices shown (12 of 14)]
[im 1/1]
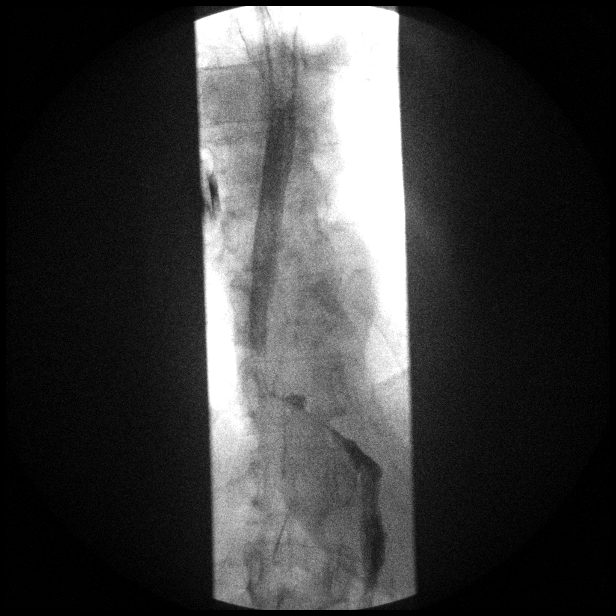

[Series 20: run · 1 of 1 slices shown (13 of 14)]
[im 1/1]
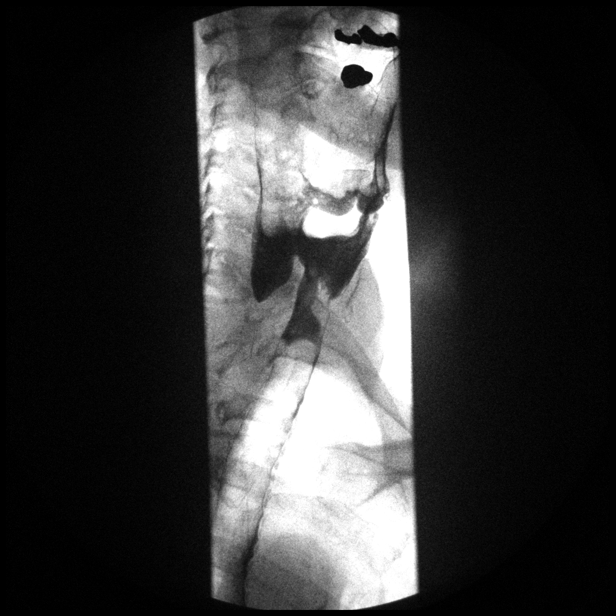

[Series 22: run · 1 of 1 slices shown (14 of 14)]
[im 1/1]
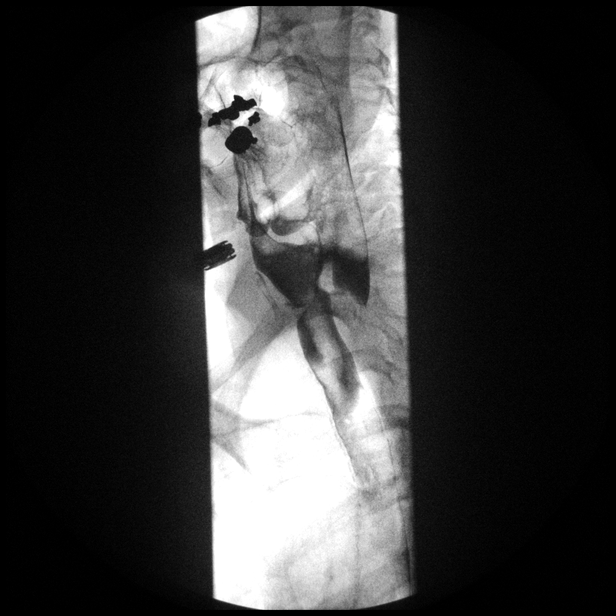

[14 of 24 positions shown; findings below may reference images not displayed]

FINDINGS: The patient has aspiration of the thin barium with each
swallow. There is  very limited deflection of the epiglottis with
swallowing.

There is marked retention of barium in the valleculae and piriform
sinuses.  The patient also swallows a large amount of air that
distends the valleculae and piriform sinuses.  There is spillover
of secretions into the trachea from the piriform sinuses.  There is
suboptimal visualization of the remainder of the esophagus but
there is no obstruction.  No mass lesion.
IMPRESSION: Recurrent aspiration of contrast with each swallow due to abnormal
motion of the epiglottis.  Retention of secretions and barium in
the valleculae and piriform sinuses which does not clear with
repeated swallowing.  Spillover from the piriform sinuses into the
trachea.

The esophagus appears normal.

I gave the patient percussion and postural drainage at the
completion of the procedure to remove as much of the retained
secretions and barium from the piriform sinuses and from the airway
as possible.  Mrs. Monteagudo tolerated the procedure well.

## 2014-04-19 ENCOUNTER — Encounter: Payer: Self-pay | Admitting: Family Medicine

## 2014-04-19 ENCOUNTER — Ambulatory Visit (INDEPENDENT_AMBULATORY_CARE_PROVIDER_SITE_OTHER): Payer: Medicare Other | Admitting: Family Medicine

## 2014-04-19 VITALS — BP 136/88 | HR 80 | Temp 98.0°F | Ht 60.25 in | Wt 122.8 lb

## 2014-04-19 DIAGNOSIS — I10 Essential (primary) hypertension: Secondary | ICD-10-CM

## 2014-04-19 DIAGNOSIS — N760 Acute vaginitis: Secondary | ICD-10-CM

## 2014-04-19 NOTE — Patient Instructions (Signed)
-  use the monistat or clotrimazole over the counter yeast cream to itchy area 1-2 times daily for 1-2 weeks  -use the hydrocortisone over the counter cream 1 time daily for up to 1 week to help with the itching if needed

## 2014-04-19 NOTE — Progress Notes (Signed)
HPI:  Vulvovaginal Pruritis: -started 1 week ago -mild itching -after taking abx for uti -denies: fevers, vag discharge, pain, dysuria  HTN: -BP elevated on initial check -she reports white coat hypertension and did not take her BP medications this morning, usually takes at night -reports CP aware   ROS: See pertinent positives and negatives per HPI.  Past Medical History  Diagnosis Date  . Hypertension   . Anxiety   . Cataract     left  . Depression   . GERD (gastroesophageal reflux disease)   . Osteoporosis   . Dysphagia     "have had it for 7 yr; got worse 4 days ago" (05/12/2012)  . Migraines     "menopause cured them" (05/12/2012)  . Arthritis     "qwhere; but it doesn't hurt" (05/12/2012)    Past Surgical History  Procedure Laterality Date  . Cataract extraction w/ intraocular lens implant Left ? 2010  . Esophagogastroduodenoscopy N/A 05/10/2012    Procedure: ESOPHAGOGASTRODUODENOSCOPY (EGD);  Surgeon: Jerene Bears, MD;  Location: [facility];  Service: Gastroenterology;  Laterality: N/A;  . Tonsillectomy  1940's    Family History  Problem Relation Age of Onset  . Arthritis Mother   . Hypertension Mother   . Dementia Mother   . Cancer Father     colon     History   Social History  . Marital Status: Widowed    Spouse Name: N/A  . Number of Children: N/A  . Years of Education: N/A   Social History Main Topics  . Smoking status: Never Smoker   . Smokeless tobacco: Never Used  . Alcohol Use: No     Comment: 05/12/2012 "have a drink hardly ever anymore"  . Drug Use: No  . Sexual Activity: No   Other Topics Concern  . None   Social History Narrative     Current outpatient prescriptions:  .  amitriptyline (ELAVIL) 50 MG tablet, Take 225 mg by mouth at bedtime. , Disp: , Rfl:  .  atenolol (TENORMIN) 50 MG tablet, Take 1.5 tablets (75 mg total) by mouth daily., Disp: 180 tablet, Rfl: 4 .  diazepam (VALIUM) 5 MG tablet, Take 1 tablet (5 mg total)  by mouth every 8 (eight) hours as needed., Disp: 60 tablet, Rfl: 4 .  feeding supplement (ENSURE COMPLETE) LIQD, Take 237 mLs by mouth 3 (three) times daily between meals., Disp: , Rfl:  .  lisinopril (PRINIVIL,ZESTRIL) 40 MG tablet, Take 40 mg by mouth daily., Disp: , Rfl:   EXAM:  Filed Vitals:   04/19/14 0914  BP: 136/88  Pulse: 80  Temp: 98 F (36.7 C)    Body mass index is 23.8 kg/(m^2).  GENERAL: vitals reviewed and listed above, alert, oriented, appears well hydrated and in no acute distress  HEENT: atraumatic, conjunttiva clear, no obvious abnormalities on inspection of external nose and ears  NECK: no obvious masses on inspection  GU: mild vulvovaginal pruritis  MS: moves all extremities without noticeable abnormality  PSYCH: pleasant and cooperative, no obvious depression or anxiety  ASSESSMENT AND PLAN:  Discussed the following assessment and plan:  Vaginitis and vulvovaginitis  Essential hypertension  -tx likely yeast dermatitis/vulvovaginitis - tx per instructions -BP much improved on recheck sitting -Patient advised to return or notify a doctor immediately if symptoms worsen or persist or new concerns arise.  Patient Instructions  -use the monistat or clotrimazole over the counter yeast cream to itchy area 1-2 times daily for 1-2 weeks  -  use the hydrocortisone over the counter cream 1 time daily for up to 1 week to help with the itching if needed     KIM, HANNAH R.

## 2014-04-19 NOTE — Progress Notes (Signed)
Pre visit review using our clinic review tool, if applicable. No additional management support is needed unless otherwise documented below in the visit note. 

## 2014-04-20 ENCOUNTER — Telehealth: Payer: Self-pay | Admitting: Internal Medicine

## 2014-04-20 NOTE — Telephone Encounter (Signed)
Patient

## 2014-04-20 NOTE — Telephone Encounter (Signed)
Pt saw dr Maudie Mercury on wed.  Pt advised to get OTC monistat.  Pt states there are too many choices and would like to get the that is best for her and go over instructions w/ her about how to use. pls advise

## 2014-04-20 NOTE — Telephone Encounter (Signed)
Use cream twice daily to itchy area. No yogurt vaginally - ok to eat if she wishes. Ok to use unscented pantiliners if needed.

## 2014-04-20 NOTE — Telephone Encounter (Signed)
Patient informed. 

## 2014-04-20 NOTE — Telephone Encounter (Signed)
I called the pt and she wanted to know how to use the OTC yeast treament whether to use in the vagina and/or outside?  She stated she was using [employer] and questioned if she should use yogurt vaginally or eat this by mouth? Also stated she uses pantiliners a lot and asked if this was OK?

## 2014-04-20 NOTE — Telephone Encounter (Signed)
Any of the OTC yeast creams will work - they are all quite similar. Thank you.

## 2014-04-20 NOTE — Telephone Encounter (Signed)
emmi emailed °

## 2014-05-17 ENCOUNTER — Telehealth: Payer: Self-pay | Admitting: Internal Medicine

## 2014-05-17 NOTE — Telephone Encounter (Signed)
Spoke to pt, told her discussed with Dr. Yong Channel due to Dr.K gone for the day. He said okay to take OTC antihistamine but if any dizziness or confusion needs to stop. Pt verbalized understanding.

## 2014-05-17 NOTE — Telephone Encounter (Signed)
Tried to contact pt no answer keep getting disconnected will try again later.

## 2014-05-17 NOTE — Telephone Encounter (Signed)
Discussed with Dr. Yong Channel due to Dr.K gone for the day. He said pt okay to take OTC antihistamine but if any dizziness or confusion needs to stop.

## 2014-05-17 NOTE — Telephone Encounter (Signed)
Pt called to ask if she can antihistamine with the meds that she is on. Would like a call back

## 2014-06-29 ENCOUNTER — Telehealth: Payer: Self-pay | Admitting: Internal Medicine

## 2014-06-29 NOTE — Telephone Encounter (Signed)
Per Dr Raliegh Ip if patient does not want to go to the ER he should be seen in the office.  Appointment made 06/30/14 with Georgina Snell.

## 2014-06-29 NOTE — Telephone Encounter (Signed)
[facility] - Client [place] [facility] Patient Name: Savannah Diaz DOB: [birth year] Initial Comment Unable to swallow. Call back: [phone] Nurse Assessment Nurse: Martyn Ehrich, RN, Solmon Ice Date/Time Eilene Ghazi Time): 06/29/2014 3:43:17 PM Confirm and document reason for call. If symptomatic, describe symptoms. ---No trouble breathing. She is having trouble talking bc of mucous. Mucous is pouring out of her mouth. onset 1 hour ago. It happened before. She was in hospital for it and they didnt know why. No one sided weakness or numbness. No fever Has the patient traveled out of the country within the last 30 days? ---No Does the patient require triage? ---Yes Related visit to physician within the last 2 weeks? ---No Does the PT have any chronic conditions? (i.e. diabetes, asthma, etc.) ---No Guidelines Guideline Title Affirmed QuestionSwallowing Difficulty [1] Severe difficulty swallowing (e.g., drooling or spitting, can't swallow water) AND [2] new onset AND [3] normal breathing Final Disposition User Go to ED Now Martyn Ehrich, RN, Felicia Comments caller is talking better now - she said she went to hospital 2 yrs ago for this and they didnt find cause- came back now after eating an orange she denies she is allergic to it. She refused to go to ER. told caller someone will call her from the office. spoke with Arbie Cookey at office and was transferred to Nurse Apolonio Schneiders and gave her report, she said she will give the epic report to Dr. Burnice Logan Call Id: [phone]

## 2014-06-30 ENCOUNTER — Encounter: Payer: Self-pay | Admitting: Adult Health

## 2014-06-30 ENCOUNTER — Ambulatory Visit (INDEPENDENT_AMBULATORY_CARE_PROVIDER_SITE_OTHER): Payer: Medicare Other | Admitting: Adult Health

## 2014-06-30 VITALS — BP 120/84 | HR 91 | Temp 99.6°F | Wt 124.0 lb

## 2014-06-30 DIAGNOSIS — R1310 Dysphagia, unspecified: Secondary | ICD-10-CM | POA: Diagnosis not present

## 2014-06-30 NOTE — Progress Notes (Signed)
Pre visit review using our clinic review tool, if applicable. No additional management support is needed unless otherwise documented below in the visit note. 

## 2014-06-30 NOTE — Progress Notes (Signed)
Subjective:    Patient ID: Savannah Diaz, female    DOB: 19-Jun-1932, 81 y.o.   MRN: [id number]  HPI  81 year old caucasian female who presents to the office today for dysphagia on Tuesday while eating an orange. The patient endorses that this has happened multiple times in the past.   Three years ago she felt like she was getting choked up on her food and she would end up spitting up a lot of saliva. She spent two days in the hospital and was evaluated by ENT. She was given "exercises" to do upon discharge and she had been doing them for the last couple of years but has recently stopped doing them. She feels as though this latest episode this week was due to "throat spasms". She has not had any incidences since Tuesday and feels as though her symptoms have improved.   She denies fever, blurred vision, loss of balance, gait issues, SOB, fevers, feeling as though her throat is closing, itching in throat or any other issues related to stroke or allergic reaction.   Review of Systems  Constitutional: Negative.   HENT: Positive for congestion. Negative for drooling, ear pain, postnasal drip, rhinorrhea, sinus pressure, sore throat and trouble swallowing.   All other systems reviewed and are negative.  Past Medical History  Diagnosis Date  . Hypertension   . Anxiety   . Cataract     left  . Depression   . GERD (gastroesophageal reflux disease)   . Osteoporosis   . Dysphagia     "have had it for 7 yr; got worse 4 days ago" (05/12/2012)  . Migraines     "menopause cured them" (05/12/2012)  . Arthritis     "qwhere; but it doesn't hurt" (05/12/2012)    History   Social History  . Marital Status: Widowed    Spouse Name: N/A  . Number of Children: N/A  . Years of Education: N/A   Occupational History  . Not on file.   Social History Main Topics  . Smoking status: Never Smoker   . Smokeless tobacco: Never Used  . Alcohol Use: No     Comment: 05/12/2012 "have a drink hardly ever  anymore"  . Drug Use: No  . Sexual Activity: No   Other Topics Concern  . Not on file   Social History Narrative    Past Surgical History  Procedure Laterality Date  . Cataract extraction w/ intraocular lens implant Left ? 2010  . Esophagogastroduodenoscopy N/A 05/10/2012    Procedure: ESOPHAGOGASTRODUODENOSCOPY (EGD);  Surgeon: Jerene Bears, MD;  Location: [facility];  Service: Gastroenterology;  Laterality: N/A;  . Tonsillectomy  1940's    Family History  Problem Relation Age of Onset  . Arthritis Mother   . Hypertension Mother   . Dementia Mother   . Cancer Father     colon     Allergies  Allergen Reactions  . Lactose Intolerance (Gi)   . Sulfa Antibiotics Hives    Childhood allergy     Current Outpatient Prescriptions on File Prior to Visit  Medication Sig Dispense Refill  . amitriptyline (ELAVIL) 50 MG tablet Take 225 mg by mouth at bedtime.     Marland Kitchen atenolol (TENORMIN) 50 MG tablet Take 1.5 tablets (75 mg total) by mouth daily. 180 tablet 4  . diazepam (VALIUM) 5 MG tablet Take 1 tablet (5 mg total) by mouth every 8 (eight) hours as needed. 60 tablet 4  . feeding supplement (ENSURE  COMPLETE) LIQD Take 237 mLs by mouth 3 (three) times daily between meals.    Marland Kitchen lisinopril (PRINIVIL,ZESTRIL) 40 MG tablet Take 40 mg by mouth daily.     No current facility-administered medications on file prior to visit.    BP 120/84 mmHg  Pulse 91  Temp(Src) 99.6 F (37.6 C) (Oral)  Wt 124 lb (56.246 kg)  SpO2 96%       Objective:   Physical Exam  Constitutional: She is oriented to person, place, and time. She appears well-developed and well-nourished. No distress.  HENT:  Head: Normocephalic and atraumatic.  Right Ear: External ear normal.  Left Ear: External ear normal.  Mouth/Throat: No oropharyngeal exudate.  Neck: Normal range of motion. Neck supple. No JVD present. No tracheal deviation present. No thyromegaly present.  Cardiovascular: Normal rate, regular rhythm,  normal heart sounds and intact distal pulses.  Exam reveals no gallop and no friction rub.   No murmur heard. Pulmonary/Chest: Effort normal and breath sounds normal. No stridor. No respiratory distress. She has no wheezes. She has no rales. She exhibits no tenderness.  Musculoskeletal: Normal range of motion. She exhibits no edema or tenderness.  Lymphadenopathy:    She has no cervical adenopathy.  Neurological: She is alert and oriented to person, place, and time.  Skin: Skin is warm and dry. No rash noted. No erythema. No pallor.  Psychiatric: She has a normal mood and affect. Her behavior is normal. Judgment and thought content normal.        Assessment & Plan:  .1. Dysphagia - low suspicion of stroke/tia/neurologic issue/allergic reaction - Recommended follow up with ENT - she refused at this time - Start doing your throat exercises - Follow up if she has any additional issues with dysphagia - Go to the ER with SOB, inability to swallow, or any neurologic type symptoms.

## 2014-06-30 NOTE — Patient Instructions (Signed)
It was great meeting you today and I am glad you are feeling better. Start doing your exercises again that the ENT doctors gave you. If you continue to have issues with swallowing or would like a referral to ENT, please let me know. Have a great summer in [place]!  Dysphagia Swallowing problems (dysphagia) occur when solids and liquids seem to stick in your throat on the way down to your stomach, or the food takes longer to get to the stomach. Other symptoms include regurgitating food, noises coming from the throat, chest discomfort with swallowing, and a feeling of fullness or the feeling of something being stuck in your throat when swallowing. When blockage in your throat is complete, it may be associated with drooling. CAUSES  Problems with swallowing may occur because of problems with the muscles. The food cannot be propelled in the usual manner into your stomach. You may have ulcers, scar tissue, or inflammation in the tube down which food travels from your mouth to your stomach (esophagus), which blocks food from passing normally into the stomach. Causes of inflammation include:  Acid reflux from your stomach into your esophagus.  Infection.  Radiation treatment for cancer.  Medicines taken without enough fluids to wash them down into your stomach. You may have nerve problems that prevent signals from being sent to the muscles of your esophagus to contract and move your food down to your stomach. Globus pharyngeus is a relatively common problem in which there is a sense of an obstruction or difficulty in swallowing, without any physical abnormalities of the swallowing passages being found. This problem usually improves over time with reassurance and testing to rule out other causes. DIAGNOSIS Dysphagia can be diagnosed and its cause can be determined by tests in which you swallow a white substance that helps illuminate the inside of your throat (contrast medium) while X-rays are taken.  Sometimes a flexible telescope that is inserted down your throat (endoscopy) to look at your esophagus and stomach is used. TREATMENT   If the dysphagia is caused by acid reflux or infection, medicines may be used.  If the dysphagia is caused by problems with your swallowing muscles, swallowing therapy may be used to help you strengthen your swallowing muscles.  If the dysphagia is caused by a blockage or mass, procedures to remove the blockage may be done. HOME CARE INSTRUCTIONS  Try to eat soft food that is easier to swallow and check your weight on a daily basis to be sure that it is not decreasing.  Be sure to drink liquids when sitting upright (not lying down). SEEK MEDICAL CARE IF:  You are losing weight because you are unable to swallow.  You are coughing when you drink liquids (aspiration).  You are coughing up partially digested food. SEEK IMMEDIATE MEDICAL CARE IF:  You are unable to swallow your own saliva .  You are having shortness of breath or a fever, or both.  You have a hoarse voice along with difficulty swallowing. MAKE SURE YOU:  Understand these instructions.  Will watch your condition.  Will get help right away if you are not doing well or get worse. Document Released: 02/08/2000 Document Revised: 06/27/2013 Document Reviewed: 07/30/2012 [facility] Patient Information 2015 [place], [place]. This information is not intended to replace advice given to you by your health care provider. Make sure you discuss any questions you have with your health care provider.

## 2014-09-04 DIAGNOSIS — H3589 Other specified retinal disorders: Secondary | ICD-10-CM | POA: Diagnosis not present

## 2014-09-04 DIAGNOSIS — H527 Unspecified disorder of refraction: Secondary | ICD-10-CM | POA: Diagnosis not present

## 2014-09-04 DIAGNOSIS — Z8679 Personal history of other diseases of the circulatory system: Secondary | ICD-10-CM | POA: Diagnosis not present

## 2014-09-04 DIAGNOSIS — H2511 Age-related nuclear cataract, right eye: Secondary | ICD-10-CM | POA: Diagnosis not present

## 2014-09-04 DIAGNOSIS — Z961 Presence of intraocular lens: Secondary | ICD-10-CM | POA: Diagnosis not present

## 2014-09-07 DIAGNOSIS — S00452A Superficial foreign body of left ear, initial encounter: Secondary | ICD-10-CM | POA: Diagnosis not present

## 2014-09-07 DIAGNOSIS — I10 Essential (primary) hypertension: Secondary | ICD-10-CM | POA: Diagnosis not present

## 2014-09-11 DIAGNOSIS — M810 Age-related osteoporosis without current pathological fracture: Secondary | ICD-10-CM | POA: Diagnosis not present

## 2014-09-11 DIAGNOSIS — Z0000 Encounter for general adult medical examination without abnormal findings: Secondary | ICD-10-CM | POA: Diagnosis not present

## 2014-09-11 DIAGNOSIS — I10 Essential (primary) hypertension: Secondary | ICD-10-CM | POA: Diagnosis not present

## 2014-09-19 DIAGNOSIS — F341 Dysthymic disorder: Secondary | ICD-10-CM | POA: Diagnosis not present

## 2014-09-20 DIAGNOSIS — Z7982 Long term (current) use of aspirin: Secondary | ICD-10-CM | POA: Diagnosis not present

## 2014-09-20 DIAGNOSIS — K224 Dyskinesia of esophagus: Secondary | ICD-10-CM | POA: Diagnosis not present

## 2014-09-20 DIAGNOSIS — Z880 Allergy status to penicillin: Secondary | ICD-10-CM | POA: Diagnosis not present

## 2014-09-20 DIAGNOSIS — Z79899 Other long term (current) drug therapy: Secondary | ICD-10-CM | POA: Diagnosis not present

## 2014-09-20 DIAGNOSIS — F419 Anxiety disorder, unspecified: Secondary | ICD-10-CM | POA: Diagnosis not present

## 2014-09-20 DIAGNOSIS — Z882 Allergy status to sulfonamides status: Secondary | ICD-10-CM | POA: Diagnosis not present

## 2014-09-20 DIAGNOSIS — K219 Gastro-esophageal reflux disease without esophagitis: Secondary | ICD-10-CM | POA: Diagnosis not present

## 2014-09-20 DIAGNOSIS — I10 Essential (primary) hypertension: Secondary | ICD-10-CM | POA: Diagnosis not present

## 2014-09-20 DIAGNOSIS — R093 Abnormal sputum: Secondary | ICD-10-CM | POA: Diagnosis not present

## 2014-10-09 DIAGNOSIS — F341 Dysthymic disorder: Secondary | ICD-10-CM | POA: Diagnosis not present

## 2014-10-10 DIAGNOSIS — E7800 Pure hypercholesterolemia, unspecified: Secondary | ICD-10-CM | POA: Insufficient documentation

## 2014-10-10 DIAGNOSIS — E7849 Other hyperlipidemia: Secondary | ICD-10-CM | POA: Insufficient documentation

## 2014-10-10 DIAGNOSIS — I10 Essential (primary) hypertension: Secondary | ICD-10-CM | POA: Diagnosis not present

## 2014-10-18 DIAGNOSIS — I10 Essential (primary) hypertension: Secondary | ICD-10-CM | POA: Diagnosis not present

## 2014-10-25 DIAGNOSIS — G8929 Other chronic pain: Secondary | ICD-10-CM | POA: Diagnosis not present

## 2014-10-25 DIAGNOSIS — R1013 Epigastric pain: Secondary | ICD-10-CM | POA: Diagnosis not present

## 2014-10-31 DIAGNOSIS — M810 Age-related osteoporosis without current pathological fracture: Secondary | ICD-10-CM | POA: Diagnosis not present

## 2014-11-01 DIAGNOSIS — F341 Dysthymic disorder: Secondary | ICD-10-CM | POA: Diagnosis not present

## 2014-11-03 DIAGNOSIS — F341 Dysthymic disorder: Secondary | ICD-10-CM | POA: Diagnosis not present

## 2014-11-16 DIAGNOSIS — D2339 Other benign neoplasm of skin of other parts of face: Secondary | ICD-10-CM | POA: Diagnosis not present

## 2014-11-16 DIAGNOSIS — L570 Actinic keratosis: Secondary | ICD-10-CM | POA: Diagnosis not present

## 2014-11-16 DIAGNOSIS — L821 Other seborrheic keratosis: Secondary | ICD-10-CM | POA: Diagnosis not present

## 2014-11-24 DIAGNOSIS — R1314 Dysphagia, pharyngoesophageal phase: Secondary | ICD-10-CM | POA: Diagnosis not present

## 2015-04-08 DIAGNOSIS — N3000 Acute cystitis without hematuria: Secondary | ICD-10-CM | POA: Diagnosis not present

## 2015-04-08 DIAGNOSIS — R8290 Unspecified abnormal findings in urine: Secondary | ICD-10-CM | POA: Diagnosis not present

## 2015-04-08 DIAGNOSIS — R300 Dysuria: Secondary | ICD-10-CM | POA: Diagnosis not present

## 2015-04-16 ENCOUNTER — Telehealth: Payer: Self-pay | Admitting: Internal Medicine

## 2015-04-16 NOTE — Telephone Encounter (Signed)
Patient Name: Savannah Diaz DOB: 14-Feb-1933 Initial Comment Caller States has itching, general vaginal warmness. last week she was in [place] and developed what she thought was a UTI and got meds. just finished the meds last night. Nurse Assessment Nurse: Marcelline Deist, RN, Lynda Date/Time (Eastern Time): 04/16/2015 9:53:35 AM Confirm and document reason for call. If symptomatic, describe symptoms. You must click the next button to save text entered. ---Caller states has itching on the outside of vaginal area, general vaginal warmness. Last week she was in [place]. and developed what she thought was a UTI. Seen in UC, took Cipro for 10 days, just finished the meds last night. Her UTI symptoms resolved. Has the patient traveled out of the country within the last 30 days? ---Not Applicable Does the patient have any new or worsening symptoms? ---Yes Will a triage be completed? ---Yes Related visit to physician within the last 2 weeks? ---No Does the PT have any chronic conditions? (i.e. diabetes, asthma, etc.) ---Yes List chronic conditions. ---on BP rx Is this a behavioral health or substance abuse call? ---No Guidelines Guideline Title Affirmed Question Affirmed Notes Vulvar Symptoms All other vulvar symptoms (Exception: feels like prior yeast infection, minor abrasion, mild rash < 24 hour duration, mild itching) Final Disposition User See PCP within [place], RN, Kermit Balo Referrals REFERRED TO PCP OFFICE Disagree/Comply: Leta Baptist

## 2015-04-16 NOTE — Telephone Encounter (Signed)
Appt scheduled with Dr. Raliegh Ip tomorrow - Juluis Rainier

## 2015-04-17 ENCOUNTER — Ambulatory Visit: Payer: Self-pay | Admitting: Internal Medicine

## 2015-04-22 ENCOUNTER — Encounter (HOSPITAL_COMMUNITY): Payer: Self-pay | Admitting: *Deleted

## 2015-04-22 ENCOUNTER — Observation Stay (HOSPITAL_COMMUNITY)
Admission: EM | Admit: 2015-04-22 | Discharge: 2015-04-25 | Disposition: A | Discharge status: Home / Self Care | Payer: Medicare Other | Attending: Family Medicine | Admitting: Family Medicine

## 2015-04-22 ENCOUNTER — Emergency Department (HOSPITAL_COMMUNITY): Payer: Medicare Other

## 2015-04-22 DIAGNOSIS — R1310 Dysphagia, unspecified: Secondary | ICD-10-CM | POA: Diagnosis not present

## 2015-04-22 DIAGNOSIS — G459 Transient cerebral ischemic attack, unspecified: Secondary | ICD-10-CM | POA: Diagnosis not present

## 2015-04-22 DIAGNOSIS — G454 Transient global amnesia: Principal | ICD-10-CM | POA: Insufficient documentation

## 2015-04-22 DIAGNOSIS — I10 Essential (primary) hypertension: Secondary | ICD-10-CM | POA: Diagnosis not present

## 2015-04-22 DIAGNOSIS — S0990XA Unspecified injury of head, initial encounter: Secondary | ICD-10-CM | POA: Diagnosis not present

## 2015-04-22 LAB — CBC
HEMATOCRIT: 40.2 % (ref 36.0–46.0)
Hemoglobin: 13.4 g/dL (ref 12.0–15.0)
MCH: 30.4 pg (ref 26.0–34.0)
MCHC: 33.3 g/dL (ref 30.0–36.0)
MCV: 91.2 fL (ref 78.0–100.0)
Platelets: 285 10*3/uL (ref 150–400)
RBC: 4.41 MIL/uL (ref 3.87–5.11)
RDW: 13.9 % (ref 11.5–15.5)
WBC: 8.3 10*3/uL (ref 4.0–10.5)

## 2015-04-22 LAB — COMPREHENSIVE METABOLIC PANEL
ALT: 22 U/L (ref 14–54)
ANION GAP: 13 (ref 5–15)
AST: 18 U/L (ref 15–41)
Albumin: 3.9 g/dL (ref 3.5–5.0)
Alkaline Phosphatase: 61 U/L (ref 38–126)
BUN: 15 mg/dL (ref 6–20)
CHLORIDE: 104 mmol/L (ref 101–111)
CO2: 25 mmol/L (ref 22–32)
Calcium: 10 mg/dL (ref 8.9–10.3)
Creatinine, Ser: 1.2 mg/dL — ABNORMAL HIGH (ref 0.44–1.00)
GFR, EST AFRICAN AMERICAN: 48 mL/min — AB (ref >60)
GFR, EST NON AFRICAN AMERICAN: 42 mL/min — AB (ref >60)
Glucose, Bld: 122 mg/dL — ABNORMAL HIGH (ref 65–99)
POTASSIUM: 3.7 mmol/L (ref 3.5–5.1)
Sodium: 142 mmol/L (ref 135–145)
Total Bilirubin: 0.4 mg/dL (ref 0.3–1.2)
Total Protein: 6.7 g/dL (ref 6.5–8.1)

## 2015-04-22 LAB — TROPONIN I

## 2015-04-22 LAB — CBG MONITORING, ED: Glucose-Capillary: 97 mg/dL (ref 65–99)

## 2015-04-22 MED ORDER — LORAZEPAM 2 MG/ML IJ SOLN
0.5000 mg | Freq: Once | INTRAMUSCULAR | Status: AC
  Administered 2015-04-22: 0.5 mg via INTRAVENOUS
  Filled 2015-04-22: qty 1

## 2015-04-22 NOTE — ED Notes (Signed)
Patient transported to MRI 

## 2015-04-22 NOTE — ED Notes (Signed)
Pt went to bathroom, pt dropped specimen into toilet, will attempt again later.

## 2015-04-22 NOTE — ED Notes (Signed)
Pt So X 4 with moments of confusion of today's events. Denies numbness and tingling now, Neuro intact.

## 2015-04-22 NOTE — ED Notes (Signed)
pts neuro system intact, equal strength, grip and symmetry.
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
## 2015-04-23 ENCOUNTER — Observation Stay (HOSPITAL_BASED_OUTPATIENT_CLINIC_OR_DEPARTMENT_OTHER): Payer: Medicare Other

## 2015-04-23 DIAGNOSIS — I10 Essential (primary) hypertension: Secondary | ICD-10-CM | POA: Diagnosis not present

## 2015-04-23 DIAGNOSIS — G454 Transient global amnesia: Secondary | ICD-10-CM | POA: Diagnosis not present

## 2015-04-23 DIAGNOSIS — G459 Transient cerebral ischemic attack, unspecified: Secondary | ICD-10-CM | POA: Diagnosis present

## 2015-04-23 DIAGNOSIS — R1310 Dysphagia, unspecified: Secondary | ICD-10-CM | POA: Diagnosis not present

## 2015-04-23 LAB — RAPID URINE DRUG SCREEN, HOSP PERFORMED
Amphetamines: NOT DETECTED
BARBITURATES: NOT DETECTED
Benzodiazepines: POSITIVE — AB
COCAINE: NOT DETECTED
Opiates: NOT DETECTED
TETRAHYDROCANNABINOL: NOT DETECTED

## 2015-04-23 LAB — LIPID PANEL
CHOL/HDL RATIO: 5.9 ratio
Cholesterol: 218 mg/dL — ABNORMAL HIGH (ref 0–200)
HDL: 37 mg/dL — AB (ref >40)
LDL CALC: 166 mg/dL — AB (ref 0–99)
Triglycerides: 73 mg/dL (ref <150)
VLDL: 15 mg/dL (ref 0–40)

## 2015-04-23 MED ORDER — LISINOPRIL 20 MG PO TABS
40.0000 mg | ORAL_TABLET | Freq: Every day | ORAL | Status: DC
  Administered 2015-04-24: 40 mg via ORAL
  Filled 2015-04-23 (×2): qty 2

## 2015-04-23 MED ORDER — ENSURE ENLIVE PO LIQD
237.0000 mL | Freq: Two times a day (BID) | ORAL | Status: DC
  Administered 2015-04-23 – 2015-04-25 (×3): 237 mL via ORAL
  Filled 2015-04-23 (×8): qty 237

## 2015-04-23 MED ORDER — DIAZEPAM 5 MG PO TABS
7.5000 mg | ORAL_TABLET | Freq: Three times a day (TID) | ORAL | Status: DC | PRN
  Administered 2015-04-23: 7.5 mg via ORAL
  Filled 2015-04-23 (×2): qty 2

## 2015-04-23 MED ORDER — ENOXAPARIN SODIUM 30 MG/0.3ML ~~LOC~~ SOLN
30.0000 mg | SUBCUTANEOUS | Status: DC
Start: 2015-04-23 — End: 2015-04-25
  Administered 2015-04-23 – 2015-04-25 (×3): 30 mg via SUBCUTANEOUS
  Filled 2015-04-23 (×3): qty 0.3

## 2015-04-23 MED ORDER — ATENOLOL 25 MG PO TABS
75.0000 mg | ORAL_TABLET | Freq: Every day | ORAL | Status: DC
  Administered 2015-04-24: 75 mg via ORAL
  Filled 2015-04-23 (×2): qty 3

## 2015-04-23 MED ORDER — AMITRIPTYLINE HCL 100 MG PO TABS
250.0000 mg | ORAL_TABLET | Freq: Every day | ORAL | Status: DC
  Administered 2015-04-23 – 2015-04-24 (×2): 250 mg via ORAL
  Filled 2015-04-23: qty 10
  Filled 2015-04-23: qty 2.5

## 2015-04-23 MED ORDER — HYDROCORTISONE 1 % EX CREA: 1.0000 "application " | TOPICAL_CREAM | Freq: Every day | CUTANEOUS | Status: DC | PRN
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
## 2015-04-23 NOTE — Progress Notes (Signed)
  Echocardiogram 2D Echocardiogram has been performed.  Savannah Diaz 04/23/2015, 1:36 PM

## 2015-04-23 NOTE — Care Management Obs Status (Signed)
[place] NOTIFICATION   Patient Details  Name: ABIGAILE BRONKHORST MRN: [id number] Date of Birth: 09/23/1932   Medicare Observation Status Notification Given:  Yes (MRI negative)    Pollie Friar, RN 04/23/2015, 2:40 PM

## 2015-04-24 ENCOUNTER — Observation Stay (HOSPITAL_COMMUNITY): Payer: Medicare Other

## 2015-04-24 ENCOUNTER — Observation Stay (HOSPITAL_COMMUNITY)
Admit: 2015-04-24 | Discharge: 2015-04-24 | Disposition: A | Discharge status: Home / Self Care | Payer: Medicare Other | Attending: Family Medicine | Admitting: Family Medicine

## 2015-04-24 DIAGNOSIS — G459 Transient cerebral ischemic attack, unspecified: Secondary | ICD-10-CM

## 2015-04-24 DIAGNOSIS — R4182 Altered mental status, unspecified: Secondary | ICD-10-CM

## 2015-04-24 DIAGNOSIS — D329 Benign neoplasm of meninges, unspecified: Secondary | ICD-10-CM | POA: Diagnosis not present

## 2015-04-24 DIAGNOSIS — I10 Essential (primary) hypertension: Secondary | ICD-10-CM | POA: Diagnosis not present

## 2015-04-24 DIAGNOSIS — G454 Transient global amnesia: Secondary | ICD-10-CM | POA: Diagnosis not present

## 2015-04-24 LAB — HEMOGLOBIN A1C
HEMOGLOBIN A1C: 5.8 % — AB (ref 4.8–5.6)
MEAN PLASMA GLUCOSE: 120 mg/dL

## 2015-04-24 MED ORDER — LEVETIRACETAM 250 MG PO TABS
250.0000 mg | ORAL_TABLET | Freq: Two times a day (BID) | ORAL | Status: DC
Start: 2015-04-24 — End: 2015-04-25
  Administered 2015-04-24: 250 mg via ORAL
  Filled 2015-04-24 (×2): qty 1

## 2015-04-24 MED ORDER — ACETAMINOPHEN 325 MG PO TABS
650.0000 mg | ORAL_TABLET | Freq: Four times a day (QID) | ORAL | Status: DC | PRN
  Administered 2015-04-24: 650 mg via ORAL
  Filled 2015-04-24: qty 2

## 2015-04-24 MED ORDER — LISINOPRIL 20 MG PO TABS: 40.0000 mg | ORAL_TABLET | Freq: Every day | ORAL | Status: DC
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
## 2015-04-24 NOTE — Progress Notes (Signed)
EEG Completed; Results Pending  
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
## 2015-04-24 NOTE — Procedures (Signed)
   Current facility-administered medications:  .  acetaminophen (TYLENOL) tablet 650 mg, 650 mg, Oral, Q6H PRN, Gardiner Barefoot, NP, 650 mg at 04/24/15 0501 .  amitriptyline (ELAVIL) tablet 250 mg, 250 mg, Oral, QHS, Oswald Hillock, MD, 250 mg at 04/23/15 2242 .  atenolol (TENORMIN) tablet 75 mg, 75 mg, Oral, Daily, Etta Quill, DO, 75 mg at 04/24/15 0901 .  diazepam (VALIUM) tablet 7.5 mg, 7.5 mg, Oral, Q8H PRN, Etta Quill, DO, 7.5 mg at 04/23/15 1507 .  enoxaparin (LOVENOX) injection 30 mg, 30 mg, Subcutaneous, Q24H, Jared M Gardner, DO, 30 mg at 04/24/15 [id number] .  feeding supplement (ENSURE ENLIVE) (ENSURE ENLIVE) liquid 237 mL, 237 mL, Oral, BID BM, Jared M Gardner, DO, 237 mL at 04/23/15 1446 .  hydrocortisone cream 1 % 1 application, 1 application, Topical, Daily PRN, Etta Quill, DO .  [START ON 04/25/2015] lisinopril (PRINIVIL,ZESTRIL) tablet 40 mg, 40 mg, Oral, QHS, Oswald Hillock, MD  Introduction:  This is a 19 channel routine scalp EEG performed at the bedside with bipolar and monopolar montages arranged in accordance to the international 10/20 system of electrode placement. One channel was dedicated to EKG recording.    Findings:  The background rhythm was normal 9-10 Hz alpha . No definite evidence of abnormal epileptiform discharges or electrographic seizures were noted during this recording.   Impression:  This is a normal awake and drowsy routine inpatient EEG. Clinical correlation is recommended .

## 2015-04-24 NOTE — Care Management Note (Signed)
Case Management Note  Patient Details  Name: Savannah Diaz MRN: [id number] Date of Birth: September 11, 1932  Subjective/Objective:    Patient admitted with amnesia. MRI results negative. Patient is from home alone.               Action/Plan: No f/u recommendations per PT. CM following for discharge disposition.   Expected Discharge Date:                  Expected Discharge Plan:     In-House Referral:     Discharge planning Services     Post Acute Care Choice:    Choice offered to:     DME Arranged:    DME Agency:     HH Arranged:    HH Agency:     Status of Service:  In process, will continue to follow  Medicare Important Message Given:    Date Medicare IM Given:    Medicare IM give by:    Date Additional Medicare IM Given:    Additional Medicare Important Message give by:     If discussed at [place] of Stay Meetings, dates discussed:    Additional Comments:  Pollie Friar, RN 04/24/2015, 10:42 AM

## 2015-04-25 ENCOUNTER — Observation Stay (HOSPITAL_BASED_OUTPATIENT_CLINIC_OR_DEPARTMENT_OTHER): Payer: Medicare Other

## 2015-04-25 DIAGNOSIS — G454 Transient global amnesia: Secondary | ICD-10-CM

## 2015-04-25 MED ORDER — LEVETIRACETAM 100 MG/ML PO SOLN: 250.0000 mg | Freq: Two times a day (BID) | ORAL | Status: DC

## 2015-04-25 MED ORDER — LEVETIRACETAM 100 MG/ML PO SOLN
250.0000 mg | Freq: Two times a day (BID) | ORAL | Status: DC
  Administered 2015-04-25: 250 mg via ORAL
  Filled 2015-04-25: qty 2.5

## 2015-04-25 NOTE — Progress Notes (Signed)
VASCULAR LAB PRELIMINARY  PRELIMINARY  PRELIMINARY  PRELIMINARY  Carotid duplex has been completed.    Bilateral:  1-39% ICA stenosis.  Vertebral artery flow is antegrade.     Quentina Fronek, RVT, RDMS 04/25/2015, 9:48 AM   .

## 2015-04-25 NOTE — Care Management Note (Signed)
Case Management Note  Patient Details  Name: Savannah Diaz MRN: [id number] Date of Birth: [birth year]  Subjective/Objective:                    Action/Plan: Patient discharging home with self care. No f/u per PT/OT. No further needs per CM.   Expected Discharge Date:                  Expected Discharge Plan:  Home/Self Care  In-House Referral:     Discharge planning Services     Post Acute Care Choice:    Choice offered to:     DME Arranged:    DME Agency:     HH Arranged:    [place] Agency:     Status of Service:  Completed, signed off  Medicare Important Message Given:    Date Medicare IM Given:    Medicare IM give by:    Date Additional Medicare IM Given:    Additional Medicare Important Message give by:     If discussed at [place] of Stay Meetings, dates discussed:    Additional Comments:  Pollie Friar, RN 04/25/2015, 1:31 PM

## 2015-04-25 NOTE — Progress Notes (Signed)
Pt discharged per orders from MD. Pt and daughter educated on discharge instructions. Pt and daughter verbalized understanding of instructions. Pt's IV removed before discharge. All questions and concerns were addressed. Pt exited hospital via wheelchair.
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
## 2015-04-26 ENCOUNTER — Telehealth: Payer: Self-pay | Admitting: *Deleted

## 2015-04-26 NOTE — Telephone Encounter (Signed)
Transition Care Management Follow-up Telephone Call  How have you been since you were released from the hospital? Doing okay   Do you understand why you were in the hospital? yes   Do you understand the discharge instrcutions? yes  Items Reviewed:  Medications reviewed: yes  Allergies reviewed: yes  Dietary changes reviewed: yes  Referrals reviewed: yes   Functional Questionnaire:   Activities of Daily Living (ADLs):   She states they are independent in the following: ambulation, bathing and hygiene, feeding, continence, grooming, toileting and dressing States they require assistance with the following: none   Any transportation issues/concerns?: no   Any patient concerns? no   Confirmed importance and date/time of follow-up visits scheduled: yes   Confirmed with patient if condition begins to worsen call PCP or go to the ER.  Patient was given the Call-a-Nurse line [phone]: yes Patient was discharged to 04/25/15 Patient was discharged to her home Patient has an appointment with Dr Raliegh Ip 04/27/15

## 2015-04-27 ENCOUNTER — Ambulatory Visit (INDEPENDENT_AMBULATORY_CARE_PROVIDER_SITE_OTHER): Payer: Medicare Other | Admitting: Internal Medicine

## 2015-04-27 ENCOUNTER — Encounter: Payer: Self-pay | Admitting: Internal Medicine

## 2015-04-27 VITALS — BP 110/80 | HR 54 | Temp 98.2°F | Wt 120.0 lb

## 2015-04-27 DIAGNOSIS — R1310 Dysphagia, unspecified: Secondary | ICD-10-CM

## 2015-04-27 DIAGNOSIS — I10 Essential (primary) hypertension: Secondary | ICD-10-CM | POA: Diagnosis not present

## 2015-04-27 DIAGNOSIS — G454 Transient global amnesia: Secondary | ICD-10-CM | POA: Diagnosis not present

## 2015-04-27 DIAGNOSIS — G40909 Epilepsy, unspecified, not intractable, without status epilepticus: Secondary | ICD-10-CM | POA: Diagnosis not present

## 2015-04-27 NOTE — Patient Instructions (Signed)
Please follow up with neurology  Please check your blood pressure on a regular basis.  If it is consistently greater than 150/90, please make an office appointment.  Limit your sodium (Salt) intake   It is important that you exercise regularly, at least 20 minutes 3 to 4 times per week.  If you develop chest pain or shortness of breath seek  medical attention.  Seizure precautions as discussed

## 2015-04-27 NOTE — Progress Notes (Signed)
Pre visit review using our clinic review tool, if applicable. No additional management support is needed unless otherwise documented below in the visit note. 
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
## 2015-05-09 ENCOUNTER — Encounter: Payer: Self-pay | Admitting: Neurology

## 2015-05-09 ENCOUNTER — Ambulatory Visit (INDEPENDENT_AMBULATORY_CARE_PROVIDER_SITE_OTHER): Payer: Medicare Other | Admitting: Neurology

## 2015-05-09 VITALS — BP 118/76 | HR 82 | Wt 124.0 lb

## 2015-05-09 DIAGNOSIS — R404 Transient alteration of awareness: Secondary | ICD-10-CM

## 2015-05-09 NOTE — Progress Notes (Signed)
NEUROLOGY CONSULTATION NOTE  Savannah Diaz MRN: [id number] DOB: 10/23/1932  Referring provider: Dr. Bluford Kaufmann Primary care provider: Dr. Bluford Kaufmann  Reason for consult:  Transient episode of confusion  Dear Dr Burnice Logan:  Thank you for your kind referral of Savannah Diaz for consultation of the above symptoms. Although her history is well known to you, please allow me to reiterate it for the purpose of our medical record. The patient was accompanied to the clinic by her daughter who also provides collateral information. Records and images were personally reviewed where available.  HISTORY OF PRESENT ILLNESS: This is a pleasant 82 year old right-handed woman with a history of hypertension, depression, presenting after an episode of transient alteration of awareness last 04/22/2015. She recalls going to the grocery to get food for guests at her house, then started feeling unwell. She recalls going back to her car and vaguely recalls driving her car ("a surge that I didn't mean to do" ?gas). She thought that this was not right and decided to sit in the car until she felt better. The next thing she recalls is a couple knocking on her car window asking if she was okay. They took her phone to call friends/family, she recalls thinking she wanted to get her phone back. She did not think she was confused, wanting to go home. They eventually got a hold of her daughter who was there within 10 minutes. When she arrived, she saw her mother's car parked on the side of the road with her blinkers on. She was confused, but states that her mother usually gets more confused when she gets agitated, although was more confused than typical. She was brought to the ER at around 7pm, and was felt to be back to her normal baseline at 3-4am. No focal weakness noted. Bloodwork was unremarkable except for creatinine of 1.20. UDS positive for benzodiazepines. Her routine wake and drowsy EEG was normal. I  personally reviewed MRI brain without contrast with no acute changes seen, there was mild chronic microvascular disease. She had a 66mm left frontal extra-axial mass compatible with a meningioma. It was noted to be slightly larger compared to previous MRI brain in 2014. Due to abnormal MRI, she was started on low dose Keppra 250mg  BID which she has been tolerating without side effects.  They deny any prior similar episodes. No staring/unresponsive episodes. She denies any olfactory/gustatory hallucinations, deja vu, rising epigastric sensation, focal numbness/tingling/weakness, myoclonic jerks. She denies any alcohol or sleep deprivation prior. Her daughter reports that she usually takes her BP medications at night, but because she was having friends over, she took her medications earlier that afternoon. She has a history of migraines but none after menopause. She denies any dizziness, diplopia, neck/back pain, bowel/bladder incontinence. She has a history of dysphagia and has to crush her pills. Last month she was treated for a UTI and took antibiotics. She has been taking high dose Elavil 250mg  qhs for many years for depression, attempts to wean have made depression worse. Her daughter has seizures. Otherwise she had a normal birth and early development.  There is no history of febrile convulsions, CNS infections such as meningitis/encephalitis, significant traumatic brain injury, neurosurgical procedures.  PAST MEDICAL HISTORY: Past Medical History  Diagnosis Date  . Hypertension   . Anxiety   . Cataract     left  . Depression   . GERD (gastroesophageal reflux disease)   . Osteoporosis   . Dysphagia     "  have had it for 7 yr; got worse 4 days ago" (05/12/2012)  . Migraines     "menopause cured them" (05/12/2012)  . Arthritis     "qwhere; but it doesn't hurt" (05/12/2012)    PAST SURGICAL HISTORY: Past Surgical History  Procedure Laterality Date  . Cataract extraction w/ intraocular lens  implant Left ? 2010  . Esophagogastroduodenoscopy N/A 05/10/2012    Procedure: ESOPHAGOGASTRODUODENOSCOPY (EGD);  Surgeon: Jerene Bears, MD;  Location: [facility];  Service: Gastroenterology;  Laterality: N/A;  . Tonsillectomy  1940's    MEDICATIONS: Current Outpatient Prescriptions on File Prior to Visit  Medication Sig Dispense Refill  . amitriptyline (ELAVIL) 50 MG tablet Take 250 mg by mouth at bedtime. Takes 5 tabs nightly-current dosage    . atenolol (TENORMIN) 50 MG tablet Take 1.5 tablets (75 mg total) by mouth daily. 180 tablet 4  . diazepam (VALIUM) 5 MG tablet Take 1 tablet (5 mg total) by mouth every 8 (eight) hours as needed. (Patient taking differently: every 8 (eight) hours as needed for anxiety or muscle spasms. Take 1.5 tablets as needed) 60 tablet 4  . feeding supplement (ENSURE COMPLETE) LIQD Take 237 mLs by mouth 3 (three) times daily between meals. (Patient taking differently: Take 237 mLs by mouth 2 (two) times daily between meals. )    . levETIRAcetam (KEPPRA) 100 MG/ML solution Take 2.5 mLs (250 mg total) by mouth 2 (two) times daily. 473 mL 0  . omeprazole (PRILOSEC) 40 MG capsule Take 40 mg by mouth daily.     No current facility-administered medications on file prior to visit.    ALLERGIES: Allergies  Allergen Reactions  . Lactose Intolerance (Gi) Other (See Comments)    Gi tract issues  . Sulfa Antibiotics Hives    Childhood allergy     FAMILY HISTORY: Family History  Problem Relation Age of Onset  . Arthritis Mother   . Hypertension Mother   . Dementia Mother   . Cancer Father     colon     SOCIAL HISTORY: Social History   Social History  . Marital Status: Widowed    Spouse Name: N/A  . Number of Children: 2  . Years of Education: N/A   Occupational History  . Retired    Social History Main Topics  . Smoking status: Never Smoker   . Smokeless tobacco: Never Used  . Alcohol Use: No     Comment: 05/12/2012 "have a drink hardly ever  anymore"  . Drug Use: No  . Sexual Activity: No   Other Topics Concern  . Not on file   Social History Narrative    REVIEW OF SYSTEMS: Constitutional: No fevers, chills, or sweats, no generalized fatigue, change in appetite Eyes: No visual changes, double vision, eye pain Ear, nose and throat: No hearing loss, ear pain, nasal congestion, sore throat Cardiovascular: No chest pain, palpitations Respiratory:  No shortness of breath at rest or with exertion, wheezes GastrointestinaI: No nausea, vomiting, diarrhea, abdominal pain, fecal incontinence Genitourinary:  No dysuria, urinary retention or frequency Musculoskeletal:  No neck pain, back pain Integumentary: No rash, pruritus, skin lesions Neurological: as above Psychiatric: No depression, insomnia, anxiety Endocrine: No palpitations, fatigue, diaphoresis, mood swings, change in appetite, change in weight, increased thirst Hematologic/Lymphatic:  No anemia, purpura, petechiae. Allergic/Immunologic: no itchy/runny eyes, nasal congestion, recent allergic reactions, rashes  PHYSICAL EXAM: Filed Vitals:   05/09/15 1417  BP: 118/76  Pulse: 82   General: No acute distress Head:  Normocephalic/atraumatic  Eyes: Fundoscopic exam shows bilateral sharp discs, no vessel changes, exudates, or hemorrhages Neck: supple, no paraspinal tenderness, full range of motion Back: No paraspinal tenderness Heart: regular rate and rhythm Lungs: Clear to auscultation bilaterally. Vascular: No carotid bruits. Skin/Extremities: No rash, no edema Neurological Exam: Mental status: alert and oriented to person, place, and time, no dysarthria or aphasia, Fund of knowledge is appropriate.  Recent and remote memory are intact. 3/3 delayed recall.  Attention and concentration are normal.    Able to name objects and repeat phrases. Cranial nerves: CN I: not tested CN II: pupils equal, round and reactive to light, visual fields intact, fundi unremarkable. CN  III, IV, VI:  full range of motion, no nystagmus, no ptosis CN V: facial sensation intact CN VII: upper and lower face symmetric CN VIII: hearing intact to finger rub CN IX, X: gag intact, uvula midline CN XI: sternocleidomastoid and trapezius muscles intact CN XII: tongue midline Bulk & Tone: normal, no fasciculations. Motor: 5/5 throughout with no pronator drift. Sensation: intact to light touch, cold, pin, vibration and joint position sense.  No extinction to double simultaneous stimulation.  Romberg test negative Deep Tendon Reflexes: +2 throughout, no ankle clonus Plantar responses: downgoing bilaterally Cerebellar: no incoordination on finger to nose, heel to shin. No dysdiadochokinesia Gait: narrow-based and steady, able to tandem walk adequately. Tremor: none  IMPRESSION: This is a pleasant 82 year old right-handed woman with a history of hypertension, depression, presenting after a transient episode of altered awareness last 04/22/15. Etiology of her symptoms is unclear, neurological exam normal. Routine wake and drowsy EEG normal. Her MRI brain had shown a small left frontal meningioma, and due to this was started on Keppra 250mg  BID. It was unclear if the episode was seizure, versus transient global amnesia, hypoperfusion event from potentially low BP from taking BP medications earlier than usual. Findings were discussed with the patient and her daughter. She is on a very low dose of Keppra. We will taper this off and do a 24-hour EEG to assess for focal abnormalities that increase risk for recurrent seizures. [place] driving laws were discussed with the patient, and she knows to stop driving after an episode of loss of awareness, until 6 months event-free. She will follow-up after the test. They know to call for any changes in symptoms.  Thank you for allowing me to participate in the care of this patient. Please do not hesitate to call for any questions or concerns.   Ellouise Newer,  M.D.  CC: Dr. Burnice Logan

## 2015-05-09 NOTE — Patient Instructions (Signed)
1. Schedule 24-hour EEG 2. Start tapering Keppra: Take 250mg  at night for 1 week, then stop 3. As per [place] driving laws, after an episode of loss of awareness, one should not drive until 6 months event-free 4. Follow-up after EEG, call for any changes

## 2015-05-10 DIAGNOSIS — R404 Transient alteration of awareness: Secondary | ICD-10-CM | POA: Insufficient documentation

## 2015-05-28 ENCOUNTER — Ambulatory Visit (INDEPENDENT_AMBULATORY_CARE_PROVIDER_SITE_OTHER): Payer: Medicare Other | Admitting: Neurology

## 2015-05-28 DIAGNOSIS — R404 Transient alteration of awareness: Secondary | ICD-10-CM

## 2015-06-07 ENCOUNTER — Telehealth: Payer: Self-pay | Admitting: Family Medicine

## 2015-06-07 NOTE — Telephone Encounter (Signed)
-----   Message from Cameron Sprang, MD sent at 06/07/2015  1:23 PM EDT ----- Pls let patient/daughter know EEG is normal. Would not restart Keppra at this time. Continue to monitor symptoms, call for any changes. [place] driving laws still the same, no driving until 6 months event-free. Thanks

## 2015-06-07 NOTE — Telephone Encounter (Signed)
Patient notified of results and advisement. Will call her daughter/Heather to make her aware of the results as well.

## 2015-06-07 NOTE — Procedures (Signed)
ELECTROENCEPHALOGRAM REPORT  Dates of Recording: 05/28/2015 to 05/29/2015  Patient's Name: Savannah Diaz MRN: [id number] Date of Birth: [birth year]  Referring Provider: Dr. Ellouise Newer  Procedure: 24-hour ambulatory EEG  History: This is an 82 year old woman who had a transient episode of altered awareness.  Medications: Elavil, Tenormin, Valium, Prilosec  Technical Summary: This is a 24-hour multichannel digital EEG recording measured by the international 10-20 system with electrodes applied with paste and impedances below 5000 ohms performed as portable with EKG monitoring.  The digital EEG was referentially recorded, reformatted, and digitally filtered in a variety of bipolar and referential montages for optimal display.    DESCRIPTION OF RECORDING: During maximal wakefulness, the background activity consisted of a symmetric 9.5 Hz posterior dominant rhythm which was reactive to eye opening.  There were no epileptiform discharges or focal slowing seen in wakefulness.  During the recording, the patient progresses through wakefulness, drowsiness, and Stage 2 sleep.  Again, there were no epileptiform discharges seen.  Events: There were no push button events. Typical events were not captured.   There were no electrographic seizures seen.  EKG lead was unremarkable.  IMPRESSION: This 24-hour ambulatory EEG study is normal.    CLINICAL CORRELATION: A normal EEG does not exclude a clinical diagnosis of epilepsy. Typical events were not captured. If further clinical questions remain, inpatient video EEG monitoring may be helpful.   Ellouise Newer, M.D.

## 2015-06-12 NOTE — Telephone Encounter (Signed)
Spoke with Nira Conn patients daughter and notified her of result and advisement. I also talked with her about a f/u visit with Dr. Delice Lesch and she states she didn't think that was necessary because they think they know what caused the patients episode with is that she was over medicated. I did tell her to give [place] a call if anything changes.

## 2015-08-09 ENCOUNTER — Encounter: Payer: Self-pay | Admitting: Internal Medicine

## 2015-08-22 DIAGNOSIS — L2089 Other atopic dermatitis: Secondary | ICD-10-CM | POA: Diagnosis not present

## 2015-09-05 DIAGNOSIS — Z961 Presence of intraocular lens: Secondary | ICD-10-CM | POA: Diagnosis not present

## 2015-09-05 DIAGNOSIS — H2511 Age-related nuclear cataract, right eye: Secondary | ICD-10-CM | POA: Diagnosis not present

## 2015-09-05 DIAGNOSIS — H527 Unspecified disorder of refraction: Secondary | ICD-10-CM | POA: Diagnosis not present

## 2015-09-05 DIAGNOSIS — Z8679 Personal history of other diseases of the circulatory system: Secondary | ICD-10-CM | POA: Diagnosis not present

## 2015-09-05 DIAGNOSIS — H3589 Other specified retinal disorders: Secondary | ICD-10-CM | POA: Diagnosis not present

## 2015-11-19 DIAGNOSIS — I781 Nevus, non-neoplastic: Secondary | ICD-10-CM | POA: Diagnosis not present

## 2015-11-19 DIAGNOSIS — L720 Epidermal cyst: Secondary | ICD-10-CM | POA: Diagnosis not present

## 2015-11-19 DIAGNOSIS — L821 Other seborrheic keratosis: Secondary | ICD-10-CM | POA: Diagnosis not present

## 2015-12-12 DIAGNOSIS — Z23 Encounter for immunization: Secondary | ICD-10-CM | POA: Diagnosis not present

## 2016-03-21 ENCOUNTER — Encounter (HOSPITAL_COMMUNITY): Payer: Self-pay | Admitting: Emergency Medicine

## 2016-03-21 ENCOUNTER — Observation Stay (HOSPITAL_COMMUNITY): Payer: Medicare Other

## 2016-03-21 ENCOUNTER — Inpatient Hospital Stay (HOSPITAL_COMMUNITY)
Admission: EM | Admit: 2016-03-21 | Discharge: 2016-03-23 | DRG: 194 | Disposition: A | Discharge status: Home / Self Care | Payer: Medicare Other | Attending: Family Medicine | Admitting: Family Medicine

## 2016-03-21 ENCOUNTER — Emergency Department (HOSPITAL_COMMUNITY): Payer: Medicare Other

## 2016-03-21 DIAGNOSIS — N182 Chronic kidney disease, stage 2 (mild): Secondary | ICD-10-CM | POA: Diagnosis present

## 2016-03-21 DIAGNOSIS — N179 Acute kidney failure, unspecified: Secondary | ICD-10-CM | POA: Diagnosis not present

## 2016-03-21 DIAGNOSIS — R05 Cough: Secondary | ICD-10-CM | POA: Diagnosis not present

## 2016-03-21 DIAGNOSIS — I129 Hypertensive chronic kidney disease with stage 1 through stage 4 chronic kidney disease, or unspecified chronic kidney disease: Secondary | ICD-10-CM | POA: Diagnosis present

## 2016-03-21 DIAGNOSIS — R531 Weakness: Secondary | ICD-10-CM | POA: Diagnosis not present

## 2016-03-21 DIAGNOSIS — J189 Pneumonia, unspecified organism: Secondary | ICD-10-CM

## 2016-03-21 DIAGNOSIS — R1310 Dysphagia, unspecified: Secondary | ICD-10-CM

## 2016-03-21 DIAGNOSIS — J690 Pneumonitis due to inhalation of food and vomit: Secondary | ICD-10-CM

## 2016-03-21 DIAGNOSIS — I10 Essential (primary) hypertension: Secondary | ICD-10-CM | POA: Diagnosis present

## 2016-03-21 DIAGNOSIS — J101 Influenza due to other identified influenza virus with other respiratory manifestations: Secondary | ICD-10-CM | POA: Diagnosis present

## 2016-03-21 DIAGNOSIS — E860 Dehydration: Secondary | ICD-10-CM | POA: Diagnosis not present

## 2016-03-21 DIAGNOSIS — Z79899 Other long term (current) drug therapy: Secondary | ICD-10-CM

## 2016-03-21 DIAGNOSIS — J1008 Influenza due to other identified influenza virus with other specified pneumonia: Principal | ICD-10-CM | POA: Diagnosis present

## 2016-03-21 DIAGNOSIS — Z961 Presence of intraocular lens: Secondary | ICD-10-CM | POA: Diagnosis present

## 2016-03-21 DIAGNOSIS — W19XXXA Unspecified fall, initial encounter: Secondary | ICD-10-CM | POA: Diagnosis present

## 2016-03-21 DIAGNOSIS — R4702 Dysphasia: Secondary | ICD-10-CM | POA: Diagnosis present

## 2016-03-21 DIAGNOSIS — Z91040 Latex allergy status: Secondary | ICD-10-CM

## 2016-03-21 DIAGNOSIS — J181 Lobar pneumonia, unspecified organism: Secondary | ICD-10-CM

## 2016-03-21 DIAGNOSIS — R42 Dizziness and giddiness: Secondary | ICD-10-CM | POA: Diagnosis not present

## 2016-03-21 DIAGNOSIS — E876 Hypokalemia: Secondary | ICD-10-CM | POA: Diagnosis present

## 2016-03-21 DIAGNOSIS — W0110XA Fall on same level from slipping, tripping and stumbling with subsequent striking against unspecified object, initial encounter: Secondary | ICD-10-CM | POA: Diagnosis present

## 2016-03-21 DIAGNOSIS — N390 Urinary tract infection, site not specified: Secondary | ICD-10-CM | POA: Diagnosis not present

## 2016-03-21 DIAGNOSIS — Z9842 Cataract extraction status, left eye: Secondary | ICD-10-CM

## 2016-03-21 DIAGNOSIS — Z66 Do not resuscitate: Secondary | ICD-10-CM | POA: Diagnosis present

## 2016-03-21 DIAGNOSIS — R404 Transient alteration of awareness: Secondary | ICD-10-CM | POA: Diagnosis not present

## 2016-03-21 DIAGNOSIS — K219 Gastro-esophageal reflux disease without esophagitis: Secondary | ICD-10-CM | POA: Diagnosis present

## 2016-03-21 DIAGNOSIS — Z9181 History of falling: Secondary | ICD-10-CM

## 2016-03-21 DIAGNOSIS — N189 Chronic kidney disease, unspecified: Secondary | ICD-10-CM

## 2016-03-21 HISTORY — DX: Pneumonitis due to inhalation of food and vomit: J69.0

## 2016-03-21 LAB — BASIC METABOLIC PANEL
Anion gap: 10 (ref 5–15)
BUN: 51 mg/dL — AB (ref 6–20)
CHLORIDE: 100 mmol/L — AB (ref 101–111)
CO2: 27 mmol/L (ref 22–32)
CREATININE: 2.18 mg/dL — AB (ref 0.44–1.00)
Calcium: 8.6 mg/dL — ABNORMAL LOW (ref 8.9–10.3)
GFR calc Af Amer: 23 mL/min — ABNORMAL LOW (ref >60)
GFR, EST NON AFRICAN AMERICAN: 20 mL/min — AB (ref >60)
GLUCOSE: 121 mg/dL — AB (ref 65–99)
POTASSIUM: 3.2 mmol/L — AB (ref 3.5–5.1)
SODIUM: 137 mmol/L (ref 135–145)

## 2016-03-21 LAB — CBC WITH DIFFERENTIAL/PLATELET
Basophils Absolute: 0 10*3/uL (ref 0.0–0.1)
Basophils Relative: 0 %
EOS ABS: 0 10*3/uL (ref 0.0–0.7)
EOS PCT: 1 %
HCT: 38.6 % (ref 36.0–46.0)
Hemoglobin: 12.9 g/dL (ref 12.0–15.0)
LYMPHS ABS: 1.3 10*3/uL (ref 0.7–4.0)
LYMPHS PCT: 23 %
MCH: 29.7 pg (ref 26.0–34.0)
MCHC: 33.4 g/dL (ref 30.0–36.0)
MCV: 88.7 fL (ref 78.0–100.0)
MONOS PCT: 9 %
Monocytes Absolute: 0.5 10*3/uL (ref 0.1–1.0)
Neutro Abs: 3.9 10*3/uL (ref 1.7–7.7)
Neutrophils Relative %: 67 %
PLATELETS: 204 10*3/uL (ref 150–400)
RBC: 4.35 MIL/uL (ref 3.87–5.11)
RDW: 14.2 % (ref 11.5–15.5)
WBC: 5.8 10*3/uL (ref 4.0–10.5)

## 2016-03-21 LAB — TROPONIN I: Troponin I: <0.03 ng/mL (ref <0.03)

## 2016-03-21 MED ORDER — AMITRIPTYLINE HCL 50 MG PO TABS
250.0000 mg | ORAL_TABLET | Freq: Every day | ORAL | Status: DC
  Administered 2016-03-22: 250 mg via ORAL
  Filled 2016-03-21: qty 5

## 2016-03-21 MED ORDER — ALBUTEROL SULFATE (2.5 MG/3ML) 0.083% IN NEBU: 2.5000 mg | INHALATION_SOLUTION | RESPIRATORY_TRACT | Status: DC | PRN

## 2016-03-21 MED ORDER — POTASSIUM CHLORIDE CRYS ER 20 MEQ PO TBCR
40.0000 meq | EXTENDED_RELEASE_TABLET | Freq: Once | ORAL | Status: DC
  Filled 2016-03-21: qty 2

## 2016-03-21 MED ORDER — STARCH (THICKENING) PO POWD
ORAL | Status: DC | PRN
  Filled 2016-03-21: qty 227

## 2016-03-21 MED ORDER — DM-GUAIFENESIN ER 30-600 MG PO TB12: 1.0000 | ORAL_TABLET | Freq: Two times a day (BID) | ORAL | Status: DC

## 2016-03-21 MED ORDER — ENSURE ENLIVE PO LIQD
237.0000 mL | Freq: Two times a day (BID) | ORAL | Status: DC
  Administered 2016-03-22 – 2016-03-23 (×2): 237 mL via ORAL

## 2016-03-21 MED ORDER — ONDANSETRON HCL 4 MG/2ML IJ SOLN: 4.0000 mg | Freq: Three times a day (TID) | INTRAMUSCULAR | Status: DC | PRN

## 2016-03-21 MED ORDER — DEXTROSE 5 % IV SOLN
1.0000 g | Freq: Once | INTRAVENOUS | Status: AC
  Administered 2016-03-21: 1 g via INTRAVENOUS
  Filled 2016-03-21: qty 10

## 2016-03-21 MED ORDER — SODIUM CHLORIDE 0.9 % IV SOLN
INTRAVENOUS | Status: DC
  Administered 2016-03-22 – 2016-03-23 (×4): via INTRAVENOUS

## 2016-03-21 MED ORDER — SODIUM CHLORIDE 0.9 % IV BOLUS (SEPSIS)
1000.0000 mL | Freq: Once | INTRAVENOUS | Status: AC
  Administered 2016-03-22: 1000 mL via INTRAVENOUS

## 2016-03-21 MED ORDER — ATENOLOL 25 MG PO TABS
75.0000 mg | ORAL_TABLET | Freq: Every day | ORAL | Status: DC
  Administered 2016-03-22 – 2016-03-23 (×2): 75 mg via ORAL
  Filled 2016-03-21 (×2): qty 1

## 2016-03-21 MED ORDER — PIPERACILLIN-TAZOBACTAM 3.375 G IVPB
3.3750 g | Freq: Three times a day (TID) | INTRAVENOUS | Status: DC
  Administered 2016-03-22 – 2016-03-23 (×5): 3.375 g via INTRAVENOUS
  Filled 2016-03-21 (×5): qty 50

## 2016-03-21 MED ORDER — DEXTROSE 5 % IV SOLN
500.0000 mg | Freq: Once | INTRAVENOUS | Status: DC
  Filled 2016-03-21: qty 500

## 2016-03-21 MED ORDER — PANTOPRAZOLE SODIUM 40 MG PO TBEC
40.0000 mg | DELAYED_RELEASE_TABLET | Freq: Every day | ORAL | Status: DC
  Administered 2016-03-22 – 2016-03-23 (×2): 40 mg via ORAL
  Filled 2016-03-21 (×2): qty 1

## 2016-03-21 MED ORDER — ZOLPIDEM TARTRATE 5 MG PO TABS: 5.0000 mg | ORAL_TABLET | Freq: Every evening | ORAL | Status: DC | PRN

## 2016-03-21 MED ORDER — ENOXAPARIN SODIUM 40 MG/0.4ML ~~LOC~~ SOLN: 40.0000 mg | SUBCUTANEOUS | Status: DC

## 2016-03-21 NOTE — H&P (Signed)
History and Physical    Savannah Diaz [id number] DOB: 06-Jul-1932 DOA: 03/21/2016  Referring MD/NP/PA:   PCP: Nyoka Cowden, MD   Patient coming from:  The patient is coming from home.  At baseline, pt is independent for most of ADL.   Chief Complaint: fall, malaise, cough, headache, generalized weakness  HPI: Savannah Diaz is a 83 y.o. female with medical history significant of hypertension, GERD, depression, gastritis, migraine headache, dysphagia, CKD-II-III, who presents with  fall, malaise, cough, headache.  Patient states that she has not been feeling well for about 1 week. She has malaise, cough, headache, decreased oral intake, generalized weakness. She does not have shortness breath, chest pain, runny nose or sore throat. Denies nausea, vomiting, diarrhea, abdominal pain, symptoms of UTI. She had multiple falls. She may have had minor head injury. No LOC. Denies unilateral weakness, numbness in extremities. No facial droop, vision change, hearing loss.  ED Course: pt was found to have WBC 5.8, negative troponin, potassium 3.2, worsening renal function, temperature normal, no tachycardia, oxygen saturation 98% on room air, positive urinalysis, positive Flu PCR for flu A. negative CT-head for acute intracranial abnormalities. Negative CT of C-spine for acute traumatic injury. chest x-ray showed patchy opacity in left base. Pt is placed on tele bed for obs.  Review of Systems:   General: no fevers, chills, no changes in body weight, has poor appetite, has fatigue, HA, malaise. HEENT: no blurry vision, hearing changes or sore throat Respiratory: no dyspnea, has coughing, no wheezing CV: no chest pain, no palpitations GI: no nausea, vomiting, abdominal pain, diarrhea, constipation GU: no dysuria, burning on urination, increased urinary frequency, hematuria  Ext: no leg edema Neuro: no unilateral weakness, numbness, or tingling, no vision change or hearing loss. Has  fall. Skin: no rash, no skin tear. MSK: No muscle spasm, no deformity, no limitation of range of movement in spin Heme: No easy bruising.  Travel history: No recent long distant travel.  Allergy:  Allergies  Allergen Reactions  . Lactose Intolerance (Gi) Other (See Comments)    Gi tract issues  . Sulfa Antibiotics Hives    Childhood allergy     Past Medical History:  Diagnosis Date  . Anxiety   . Arthritis    "qwhere; but it doesn't hurt" (05/12/2012)  . Cataract    left  . Depression   . Dysphagia    "have had it for 7 yr; got worse 4 days ago" (05/12/2012)  . GERD (gastroesophageal reflux disease)   . Hypertension   . Migraines    "menopause cured them" (05/12/2012)  . Osteoporosis     Past Surgical History:  Procedure Laterality Date  . CATARACT EXTRACTION W/ INTRAOCULAR LENS IMPLANT Left ? 2010  . ESOPHAGOGASTRODUODENOSCOPY N/A 05/10/2012   Procedure: ESOPHAGOGASTRODUODENOSCOPY (EGD);  Surgeon: Jerene Bears, MD;  Location: [facility];  Service: Gastroenterology;  Laterality: N/A;  . TONSILLECTOMY  1940's    Social History:  reports that she has never smoked. She has never used smokeless tobacco. She reports that she does not drink alcohol or use drugs.  Family History:  Family History  Problem Relation Age of Onset  . Arthritis Mother   . Hypertension Mother   . Dementia Mother   . Cancer Father     colon      Prior to Admission medications   Medication Sig Start Date End Date Taking? Authorizing Provider  amitriptyline (ELAVIL) 50 MG tablet Take 250 mg by mouth at bedtime. Takes  5 tabs nightly-current dosage 04/28/11  Yes Marletta Lor, MD  atenolol (TENORMIN) 50 MG tablet Take 1.5 tablets (75 mg total) by mouth daily. 04/28/11  Yes Marletta Lor, MD  feeding supplement (ENSURE COMPLETE) LIQD Take 237 mLs by mouth 3 (three) times daily between meals. Patient taking differently: Take 237 mLs by mouth 2 (two) times daily between meals.  05/15/12  Yes  Modena Jansky, MD  omeprazole (PRILOSEC) 40 MG capsule Take 40 mg by mouth daily as needed (reflux).    Yes Historical Provider, MD  diazepam (VALIUM) 5 MG tablet Take 1 tablet (5 mg total) by mouth every 8 (eight) hours as needed. Patient not taking: Reported on 03/21/2016 04/28/11   Marletta Lor, MD  levETIRAcetam (KEPPRA) 100 MG/ML solution Take 2.5 mLs (250 mg total) by mouth 2 (two) times daily. Patient not taking: Reported on 03/21/2016 04/25/15   Velvet Bathe, MD    Physical Exam: Vitals:   03/21/16 2016  BP: 126/64  Pulse: 72  Resp: 14  Temp: 98.2 F (36.8 C)  TempSrc: Oral  SpO2: 96%   General: Not in acute distress HEENT:       Eyes: PERRL, EOMI, no scleral icterus.       ENT: No discharge from the ears and nose, no pharynx injection, no tonsillar enlargement.        Neck: No JVD, no bruit, no mass felt. Heme: No neck lymph node enlargement. Cardiac: S1/S2, RRR, No murmurs, No gallops or rubs. Respiratory: Good air movement bilaterally. No rales, wheezing, rhonchi or rubs. GI: Soft, nondistended, nontender, no rebound pain, no organomegaly, BS present. GU: No hematuria Ext: No pitting leg edema bilaterally. 2+DP/PT pulse bilaterally. Musculoskeletal: No joint deformities, No joint redness or warmth, no limitation of ROM in spin. Skin: No rashes.  Neuro: Alert, oriented X3, cranial nerves II-XII grossly intact, moves all extremities normally. Muscle strength 5/5 in all extremities, sensation to light touch intact. Brachial reflex 2+ bilaterally. Knee reflex 1+ bilaterally. Negative Babinski's sign. Normal finger to nose test. Psych: Patient is not psychotic, no suicidal or hemocidal ideation.  Labs on Admission: I have personally reviewed following labs and imaging studies  CBC:  Recent Labs Lab 03/21/16 2152  WBC 5.8  NEUTROABS 3.9  HGB 12.9  HCT 38.6  MCV 88.7  PLT 0000000   Basic Metabolic Panel:  Recent Labs Lab 03/21/16 2152  NA 137  K 3.2*  CL  100*  CO2 27  GLUCOSE 121*  BUN 51*  CREATININE 2.18*  CALCIUM 8.6*   GFR: CrCl cannot be calculated (Unknown ideal weight.). Liver Function Tests: No results for input(s): AST, ALT, ALKPHOS, BILITOT, PROT, ALBUMIN in the last 168 hours. No results for input(s): LIPASE, AMYLASE in the last 168 hours. No results for input(s): AMMONIA in the last 168 hours. Coagulation Profile: No results for input(s): INR, PROTIME in the last 168 hours. Cardiac Enzymes:  Recent Labs Lab 03/21/16 2152  TROPONINI <0.03   BNP (last 3 results) No results for input(s): PROBNP in the last 8760 hours. HbA1C: No results for input(s): HGBA1C in the last 72 hours. CBG: No results for input(s): GLUCAP in the last 168 hours. Lipid Profile: No results for input(s): CHOL, HDL, LDLCALC, TRIG, CHOLHDL, LDLDIRECT in the last 72 hours. Thyroid Function Tests: No results for input(s): TSH, T4TOTAL, FREET4, T3FREE, THYROIDAB in the last 72 hours. Anemia Panel: No results for input(s): VITAMINB12, FOLATE, FERRITIN, TIBC, IRON, RETICCTPCT in the last 72 hours. Urine  analysis:    Component Value Date/Time   COLORURINE YELLOW 03/22/2016 0153   APPEARANCEUR CLEAR 03/22/2016 0153   LABSPEC 1.009 03/22/2016 0153   PHURINE 5.0 03/22/2016 0153   GLUCOSEU NEGATIVE 03/22/2016 0153   HGBUR NEGATIVE 03/22/2016 0153   BILIRUBINUR NEGATIVE 03/22/2016 0153   KETONESUR NEGATIVE 03/22/2016 0153   PROTEINUR NEGATIVE 03/22/2016 0153   UROBILINOGEN 0.2 05/14/2012 1317   NITRITE NEGATIVE 03/22/2016 0153   LEUKOCYTESUR LARGE (A) 03/22/2016 0153   Sepsis Labs: @LABRCNTIP (procalcitonin:4,lacticidven:4) )No results found for this or any previous visit (from the past 240 hour(s)).   Radiological Exams on Admission: Dg Chest 2 View  Result Date: 03/21/2016 CLINICAL DATA:  Decreased appetite.  Minor cough. EXAM: CHEST  2 VIEW COMPARISON:  05/12/2012 FINDINGS: Hyperexpansion suggests emphysema. No pulmonary edema or pleural  effusion. Patchy airspace disease lateral left lung base suggests pneumonia. The cardiopericardial silhouette is within normal limits for size. The visualized bony structures of the thorax are intact. IMPRESSION: Patchy airspace disease at the left base concerning for pneumonia. Electronically Signed   By: Misty Stanley M.D.   On: 03/21/2016 21:45   Ct Head Wo Contrast  Result Date: 03/22/2016 CLINICAL DATA:  Evaluation for acute trauma, fall. EXAM: CT HEAD WITHOUT CONTRAST CT CERVICAL SPINE WITHOUT CONTRAST TECHNIQUE: Multidetector CT imaging of the head and cervical spine was performed following the standard protocol without intravenous contrast. Multiplanar CT image reconstructions of the cervical spine were also generated. COMPARISON:  Prior MRI from 04/22/2015. FINDINGS: CT HEAD FINDINGS Brain: Generalized age-related cerebral atrophy with moderate chronic microvascular ischemic disease. No acute intracranial hemorrhage. No findings to suggest acute large vessel territory infarct. No mass lesion, midline shift or mass effect. No hydrocephalus. No extra-axial fluid collection. Vascular: Vascular calcifications present within the carotid siphons. No hyperdense vessel. Skull: Scalp soft tissues within normal limits.  Calvarium intact. Sinuses/Orbits: Globes and orbital soft tissues demonstrate no acute abnormality. Patient is status post lens extraction on the left. Paranasal sinuses and mastoids are clear. CT CERVICAL SPINE FINDINGS Alignment: Reversal of the normal cervical lordosis, apex at C4. Trace anterolisthesis of C2 on C3. Grade 1 anterolisthesis of C7 on T1 also present. Findings likely chronic and facet mediated. Skull base and vertebrae: Skullbase intact. Normal C1-2 articulations preserved. Dens is intact. Vertebral body heights maintained. No acute fracture. Soft tissues and spinal canal: Visualized soft tissues of the neck demonstrate no acute abnormality. No prevertebral edema. Vascular  calcifications about the carotid bifurcations. Disc levels: Advanced degenerative spondylolysis present at C4-5 through C6-7. Multilevel facet arthrosis, most notable at C7-T1. Upper chest: Visualized upper chest within normal limits. Visualized lungs are clear. IMPRESSION: CT BRAIN: 1. No acute intracranial process identified. 2. Moderate chronic microvascular ischemic disease. CT CERVICAL SPINE: 1. No acute traumatic injury within the cervical spine. 2. Advanced degenerative spondylolysis at C4-5 through C6-7. Electronically Signed   By: Jeannine Boga M.D.   On: 03/22/2016 00:23   Ct Cervical Spine Wo Contrast  Result Date: 03/22/2016 CLINICAL DATA:  Evaluation for acute trauma, fall. EXAM: CT HEAD WITHOUT CONTRAST CT CERVICAL SPINE WITHOUT CONTRAST TECHNIQUE: Multidetector CT imaging of the head and cervical spine was performed following the standard protocol without intravenous contrast. Multiplanar CT image reconstructions of the cervical spine were also generated. COMPARISON:  Prior MRI from 04/22/2015. FINDINGS: CT HEAD FINDINGS Brain: Generalized age-related cerebral atrophy with moderate chronic microvascular ischemic disease. No acute intracranial hemorrhage. No findings to suggest acute large vessel territory infarct. No mass lesion, midline shift  or mass effect. No hydrocephalus. No extra-axial fluid collection. Vascular: Vascular calcifications present within the carotid siphons. No hyperdense vessel. Skull: Scalp soft tissues within normal limits.  Calvarium intact. Sinuses/Orbits: Globes and orbital soft tissues demonstrate no acute abnormality. Patient is status post lens extraction on the left. Paranasal sinuses and mastoids are clear. CT CERVICAL SPINE FINDINGS Alignment: Reversal of the normal cervical lordosis, apex at C4. Trace anterolisthesis of C2 on C3. Grade 1 anterolisthesis of C7 on T1 also present. Findings likely chronic and facet mediated. Skull base and vertebrae: Skullbase  intact. Normal C1-2 articulations preserved. Dens is intact. Vertebral body heights maintained. No acute fracture. Soft tissues and spinal canal: Visualized soft tissues of the neck demonstrate no acute abnormality. No prevertebral edema. Vascular calcifications about the carotid bifurcations. Disc levels: Advanced degenerative spondylolysis present at C4-5 through C6-7. Multilevel facet arthrosis, most notable at C7-T1. Upper chest: Visualized upper chest within normal limits. Visualized lungs are clear. IMPRESSION: CT BRAIN: 1. No acute intracranial process identified. 2. Moderate chronic microvascular ischemic disease. CT CERVICAL SPINE: 1. No acute traumatic injury within the cervical spine. 2. Advanced degenerative spondylolysis at C4-5 through C6-7. Electronically Signed   By: Jeannine Boga M.D.   On: 03/22/2016 00:23     EKG: Independently reviewed.  Sinus rhythm, QTC 446, no ischemic change.  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 no traumatic injury on CT had a CT of C-spine. -Place patient on telemetry bed for observation -Treat underlying issues as below -PT/OT  Flu A: -tamiflu started  UTI:  -IV zosyn -f/u Bx and Ux  Cough: Chest x-ray showed infiltration in the left base, but patient does not have fever, leukocytosis, clinically does not seem to have a typical pneumonia. pt may have aspiration pneumonia.  -Pt was started with rocephin and azithromycin in ED, will switch to Zosyn to cover UTI and possible aspiration pneumonia. -When necessary Mucinex for cough -When necessary albuterol nebulizers -Sputum culture and strep pneumonia antigen  Dysphagia: pt and her daughter refused SLP -will add foot thicker    HTN: -continue Atenolol  Hypokalemia: K=3.2 on admission. - Repleted  AoCKD-II-III: Baseline Cre is 1.2 on 04/22/15, pt's Cre is 2.18on admission. Likely due to prerenal secondary to dehydration  - IVF: 1L NS, t hen 100 cc/h - Check FeNa - Follow up renal function by BMP   DVT ppx: SQ Lovenox Code Status: DNR Family Communication: Yes, patient's daughter at bed side Disposition Plan:  Anticipate discharge back to previous home environment Consults called:  none Admission status: Obs / tele        Date of Service 03/22/2016    Ivor Costa [facility] Pager [phone]  If 7PM-7AM, please contact night-coverage [email or web address] Password [email or web address] 03/22/2016, 3:20 AM

## 2016-03-21 NOTE — ED Provider Notes (Signed)
[place] DEPT Provider Note   CSN: [id number] Arrival date & time: 03/21/16  2007     History   Chief Complaint Chief Complaint  Patient presents with  . Fatigue    HPI Savannah Diaz is a 83 y.o. female.  The history is provided by the patient and medical records.    84 year old female with history of anxiety, arthritis, cataracts, depression, dysphasia, GERD, hypertension, presenting to the ED for generalized weakness. Daughter reports over the past week patient has become increasingly weak and has had several falls.  She has been having a cough and a mild headache but that seems to be getting better. States today she went by to check on her and witnessed her stand up from couch and fall.  She did hit her head briefly, no LOC.  Was able to immediately get up off the floor without issue.  Has been acting her norm since fall.  Daughter reports history of the same several times due to dehydration.  Patient has baseline dysphagia over the past several years, somewhat better since doing her vocal cord exercises at home.  States because of this she knows she does not eat and drink as much as she should.  Daughter reports this afternoon she got her food and fluids with improvement of symptoms.  Patient does not take any anti-coagulants.  She denies any current headache, dizziness, chest pain, SOB, confusion, focal numbness or weakness.  Past Medical History:  Diagnosis Date  . Anxiety   . Arthritis    "qwhere; but it doesn't hurt" (05/12/2012)  . Cataract    left  . Depression   . Dysphagia    "have had it for 7 yr; got worse 4 days ago" (05/12/2012)  . GERD (gastroesophageal reflux disease)   . Hypertension   . Migraines    "menopause cured them" (05/12/2012)  . Osteoporosis     Patient Active Problem List   Diagnosis Date Noted  . Transient alteration of awareness 05/10/2015  . Dehydration with hypernatremia 05/13/2012  . Hypokalemia 05/12/2012  . Dysphagia 05/10/2012  .  Hypertension 04/28/2011  . Osteoarthritis 04/28/2011  . Agoraphobia 04/28/2011    Past Surgical History:  Procedure Laterality Date  . CATARACT EXTRACTION W/ INTRAOCULAR LENS IMPLANT Left ? 2010  . ESOPHAGOGASTRODUODENOSCOPY N/A 05/10/2012   Procedure: ESOPHAGOGASTRODUODENOSCOPY (EGD);  Surgeon: Jerene Bears, MD;  Location: [facility];  Service: Gastroenterology;  Laterality: N/A;  . TONSILLECTOMY  1940's    OB History    No data available       Home Medications    Prior to Admission medications   Medication Sig Start Date End Date Taking? Authorizing Provider  amitriptyline (ELAVIL) 50 MG tablet Take 250 mg by mouth at bedtime. Takes 5 tabs nightly-current dosage 04/28/11   Marletta Lor, MD  atenolol (TENORMIN) 50 MG tablet Take 1.5 tablets (75 mg total) by mouth daily. 04/28/11   Marletta Lor, MD  diazepam (VALIUM) 5 MG tablet Take 1 tablet (5 mg total) by mouth every 8 (eight) hours as needed. Patient taking differently: every 8 (eight) hours as needed for anxiety or muscle spasms. Take 1.5 tablets as needed 04/28/11   Marletta Lor, MD  feeding supplement (ENSURE COMPLETE) LIQD Take 237 mLs by mouth 3 (three) times daily between meals. Patient taking differently: Take 237 mLs by mouth 2 (two) times daily between meals.  05/15/12   Modena Jansky, MD  hydrocortisone cream 1 % Apply 1 application topically. Apply  to affected area as needed    Historical Provider, MD  levETIRAcetam (KEPPRA) 100 MG/ML solution Take 2.5 mLs (250 mg total) by mouth 2 (two) times daily. 04/25/15   Velvet Bathe, MD  omeprazole (PRILOSEC) 40 MG capsule Take 40 mg by mouth daily.    Historical Provider, MD    Family History Family History  Problem Relation Age of Onset  . Arthritis Mother   . Hypertension Mother   . Dementia Mother   . Cancer Father     colon     Social History Social History  Substance Use Topics  . Smoking status: Never Smoker  . Smokeless tobacco: Never Used   . Alcohol use No     Comment: 05/12/2012 "have a drink hardly ever anymore"     Allergies   Lactose intolerance (gi) and Sulfa antibiotics   Review of Systems Review of Systems  Constitutional: Positive for fatigue.  Neurological: Positive for weakness.  All other systems reviewed and are negative.    Physical Exam Updated Vital Signs BP 126/64 (BP Location: Right Arm)   Pulse 72   Temp 98.2 F (36.8 C) (Oral)   Resp 14   SpO2 96%   Physical Exam  Constitutional: She is oriented to person, place, and time. She appears well-developed and well-nourished.  Alert, interactive, pleasant  HENT:  Head: Normocephalic and atraumatic.  Mouth/Throat: Oropharynx is clear and moist.  Voice is soft (baseline), no visible signs of head trauma  Eyes: Conjunctivae and EOM are normal. Pupils are equal, round, and reactive to light.  Neck: Normal range of motion.  Cardiovascular: Normal rate, regular rhythm and normal heart sounds.   Pulmonary/Chest: Effort normal and breath sounds normal. No respiratory distress. She has no wheezes.  Lungs clear, no distress, speaking in full sentences without difficulty  Abdominal: Soft. Bowel sounds are normal.  Musculoskeletal: Normal range of motion.  Neurological: She is alert and oriented to person, place, and time.  AAOx3, answering questions and following commands appropriately; equal strength UE and LE bilaterally; CN grossly intact; moves all extremities appropriately without ataxia; no focal neuro deficits or facial asymmetry appreciated  Skin: Skin is warm and dry.  Psychiatric: She has a normal mood and affect.  Nursing note and vitals reviewed.    ED Treatments / Results  Labs (all labs ordered are listed, but only abnormal results are displayed) Labs Reviewed  BASIC METABOLIC PANEL - Abnormal; Notable for the following:       Result Value   Potassium 3.2 (*)    Chloride 100 (*)    Glucose, Bld 121 (*)    BUN 51 (*)     Creatinine, Ser 2.18 (*)    Calcium 8.6 (*)    GFR calc non Af Amer 20 (*)    GFR calc Af Amer 23 (*)    All other components within normal limits  CBC WITH DIFFERENTIAL/PLATELET  TROPONIN I  URINALYSIS, ROUTINE W REFLEX MICROSCOPIC    EKG  EKG Interpretation None       Radiology Dg Chest 2 View  Result Date: 03/21/2016 CLINICAL DATA:  Decreased appetite.  Minor cough. EXAM: CHEST  2 VIEW COMPARISON:  05/12/2012 FINDINGS: Hyperexpansion suggests emphysema. No pulmonary edema or pleural effusion. Patchy airspace disease lateral left lung base suggests pneumonia. The cardiopericardial silhouette is within normal limits for size. The visualized bony structures of the thorax are intact. IMPRESSION: Patchy airspace disease at the left base concerning for pneumonia. Electronically Signed   By:  Misty Stanley M.D.   On: 03/21/2016 21:45    Procedures Procedures (including critical care time)  Medications Ordered in ED Medications  cefTRIAXone (ROCEPHIN) 1 g in dextrose 5 % 50 mL IVPB (not administered)  azithromycin (ZITHROMAX) 500 mg in dextrose 5 % 250 mL IVPB (not administered)  potassium chloride SA (K-DUR,KLOR-CON) CR tablet 40 mEq (not administered)     Initial Impression / Assessment and Plan / ED Course  I have reviewed the triage vital signs and the nursing notes.  Pertinent labs & imaging results that were available during my care of the patient were reviewed by me and considered in my medical decision making (see chart for details).  83 year old female here with generalized weakness and falls over the past week. No syncope, just falls. Reports recent cough and feelings of dehydration. She has baseline dysphagia which causes her to eat/drink less than normal.  Here patient is awake, alert, appropriately oriented. She has no focal neurologic deficits. No visible signs of head trauma on exam.  She is currently drinking Coke and tolerating it well. Given normal neurologic exam,  no anti-coagulant use, and no visible signs of head trauma, head CT deferred at this time.  Will plan for labs, IVF.  Lab work with evidence of AKI as well as hypokalemia.  CXR concerning for LLL pneumonia.  IVF given as well at K+ supplementation.  Given these findings, I feel patient would benefit from hospitalization, antibiotics, and continued fluids.  Rocephin and Azithromycin started.  Discussed with hospitalist team, will admit for tele/obs.  Temp admit orders placed.  Final Clinical Impressions(s) / ED Diagnoses   Final diagnoses:  Community acquired pneumonia of left lower lobe of lung ([place])  AKI (acute kidney injury) ([place])  Weakness  Hypokalemia    New Prescriptions New Prescriptions   No medications on file     Larene Pickett, PA-C 03/21/16 [place], PA-C 03/21/16 2318

## 2016-03-21 NOTE — ED Notes (Signed)
Pt in CT.

## 2016-03-21 NOTE — ED Provider Notes (Signed)
Medical screening examination/treatment/procedure(s) were conducted as a shared visit with non-physician practitioner(s) and myself.  I personally evaluated the patient during the encounter.   EKG Interpretation None     83 year old female presents with weakness and increasing cough. Chest x-ray consistent with pneumonia. She also has evidence of acute kidney injury. Will be admitted to the hospitalist   Lacretia Leigh, MD 03/21/16 2255

## 2016-03-21 NOTE — ED Notes (Signed)
Bed: [facility] Expected date:  Expected time:  Means of arrival:  Comments: EMS 83 yo dizziness.

## 2016-03-21 NOTE — ED Notes (Signed)
Pt transported to XRay 

## 2016-03-21 NOTE — ED Notes (Signed)
Pt ambulated to restroom with assistance.  

## 2016-03-21 NOTE — ED Notes (Signed)
PA at bedside.

## 2016-03-21 NOTE — ED Notes (Signed)
Hospitalist at bedside 

## 2016-03-21 NOTE — ED Triage Notes (Addendum)
Pt BIB EMS from home for malaise; pt's family states she hasn't been eating well; hx of dysphagia causes decreased intake; pt reports minor cough, body aches, and headache; denies N/V/D; appears interactive and alert

## 2016-03-22 DIAGNOSIS — E876 Hypokalemia: Secondary | ICD-10-CM | POA: Diagnosis present

## 2016-03-22 DIAGNOSIS — J101 Influenza due to other identified influenza virus with other respiratory manifestations: Secondary | ICD-10-CM | POA: Diagnosis present

## 2016-03-22 DIAGNOSIS — Z91040 Latex allergy status: Secondary | ICD-10-CM | POA: Diagnosis not present

## 2016-03-22 DIAGNOSIS — E860 Dehydration: Secondary | ICD-10-CM | POA: Diagnosis present

## 2016-03-22 DIAGNOSIS — Z961 Presence of intraocular lens: Secondary | ICD-10-CM | POA: Diagnosis present

## 2016-03-22 DIAGNOSIS — N179 Acute kidney failure, unspecified: Secondary | ICD-10-CM | POA: Diagnosis not present

## 2016-03-22 DIAGNOSIS — S199XXA Unspecified injury of neck, initial encounter: Secondary | ICD-10-CM | POA: Diagnosis not present

## 2016-03-22 DIAGNOSIS — J1008 Influenza due to other identified influenza virus with other specified pneumonia: Secondary | ICD-10-CM | POA: Diagnosis present

## 2016-03-22 DIAGNOSIS — R1310 Dysphagia, unspecified: Secondary | ICD-10-CM

## 2016-03-22 DIAGNOSIS — W0110XA Fall on same level from slipping, tripping and stumbling with subsequent striking against unspecified object, initial encounter: Secondary | ICD-10-CM | POA: Diagnosis present

## 2016-03-22 DIAGNOSIS — W19XXXD Unspecified fall, subsequent encounter: Secondary | ICD-10-CM | POA: Diagnosis not present

## 2016-03-22 DIAGNOSIS — W19XXXA Unspecified fall, initial encounter: Secondary | ICD-10-CM

## 2016-03-22 DIAGNOSIS — K219 Gastro-esophageal reflux disease without esophagitis: Secondary | ICD-10-CM | POA: Diagnosis present

## 2016-03-22 DIAGNOSIS — Z9842 Cataract extraction status, left eye: Secondary | ICD-10-CM | POA: Diagnosis not present

## 2016-03-22 DIAGNOSIS — I129 Hypertensive chronic kidney disease with stage 1 through stage 4 chronic kidney disease, or unspecified chronic kidney disease: Secondary | ICD-10-CM | POA: Diagnosis present

## 2016-03-22 DIAGNOSIS — J181 Lobar pneumonia, unspecified organism: Secondary | ICD-10-CM

## 2016-03-22 DIAGNOSIS — J690 Pneumonitis due to inhalation of food and vomit: Secondary | ICD-10-CM | POA: Diagnosis present

## 2016-03-22 DIAGNOSIS — Z79899 Other long term (current) drug therapy: Secondary | ICD-10-CM | POA: Diagnosis not present

## 2016-03-22 DIAGNOSIS — Z9181 History of falling: Secondary | ICD-10-CM | POA: Diagnosis not present

## 2016-03-22 DIAGNOSIS — N182 Chronic kidney disease, stage 2 (mild): Secondary | ICD-10-CM | POA: Diagnosis present

## 2016-03-22 DIAGNOSIS — R4702 Dysphasia: Secondary | ICD-10-CM | POA: Diagnosis present

## 2016-03-22 DIAGNOSIS — S0990XA Unspecified injury of head, initial encounter: Secondary | ICD-10-CM | POA: Diagnosis not present

## 2016-03-22 DIAGNOSIS — N390 Urinary tract infection, site not specified: Secondary | ICD-10-CM | POA: Diagnosis present

## 2016-03-22 DIAGNOSIS — Z66 Do not resuscitate: Secondary | ICD-10-CM | POA: Diagnosis present

## 2016-03-22 LAB — BASIC METABOLIC PANEL
Anion gap: 4 — ABNORMAL LOW (ref 5–15)
BUN: 24 mg/dL — AB (ref 6–20)
CO2: 27 mmol/L (ref 22–32)
CREATININE: 1.14 mg/dL — AB (ref 0.44–1.00)
Calcium: 7.4 mg/dL — ABNORMAL LOW (ref 8.9–10.3)
Chloride: 110 mmol/L (ref 101–111)
GFR calc Af Amer: 51 mL/min — ABNORMAL LOW (ref >60)
GFR, EST NON AFRICAN AMERICAN: 44 mL/min — AB (ref >60)
GLUCOSE: 108 mg/dL — AB (ref 65–99)
Potassium: 3.3 mmol/L — ABNORMAL LOW (ref 3.5–5.1)
SODIUM: 141 mmol/L (ref 135–145)

## 2016-03-22 LAB — URINALYSIS, ROUTINE W REFLEX MICROSCOPIC
Bilirubin Urine: NEGATIVE
GLUCOSE, UA: NEGATIVE mg/dL
HGB URINE DIPSTICK: NEGATIVE
KETONES UR: NEGATIVE mg/dL
NITRITE: NEGATIVE
PROTEIN: NEGATIVE mg/dL
Specific Gravity, Urine: 1.009 (ref 1.005–1.030)
pH: 5 (ref 5.0–8.0)

## 2016-03-22 LAB — MAGNESIUM: Magnesium: 2 mg/dL (ref 1.7–2.4)

## 2016-03-22 LAB — HIV ANTIBODY (ROUTINE TESTING W REFLEX): HIV SCREEN 4TH GENERATION: NONREACTIVE

## 2016-03-22 LAB — INFLUENZA PANEL BY PCR (TYPE A & B)
Influenza A By PCR: POSITIVE — AB
Influenza B By PCR: NEGATIVE

## 2016-03-22 LAB — PROCALCITONIN

## 2016-03-22 LAB — CREATININE, URINE, RANDOM: CREATININE, URINE: 73.68 mg/dL

## 2016-03-22 LAB — SODIUM, URINE, RANDOM: Sodium, Ur: 33 mmol/L

## 2016-03-22 LAB — STREP PNEUMONIAE URINARY ANTIGEN: STREP PNEUMO URINARY ANTIGEN: NEGATIVE

## 2016-03-22 MED ORDER — DIAZEPAM 5 MG PO TABS
5.0000 mg | ORAL_TABLET | Freq: Four times a day (QID) | ORAL | Status: DC | PRN
  Administered 2016-03-22: 5 mg via ORAL
  Filled 2016-03-22: qty 1

## 2016-03-22 MED ORDER — AZITHROMYCIN 250 MG PO TABS
500.0000 mg | ORAL_TABLET | Freq: Every day | ORAL | Status: AC
  Administered 2016-03-22: 500 mg via ORAL
  Filled 2016-03-22: qty 2

## 2016-03-22 MED ORDER — OSELTAMIVIR PHOSPHATE 75 MG PO CAPS
75.0000 mg | ORAL_CAPSULE | Freq: Two times a day (BID) | ORAL | Status: DC
  Administered 2016-03-22: 75 mg via ORAL
  Filled 2016-03-22: qty 1

## 2016-03-22 MED ORDER — AZITHROMYCIN 250 MG PO TABS
250.0000 mg | ORAL_TABLET | Freq: Every day | ORAL | Status: DC
  Administered 2016-03-23: 250 mg via ORAL
  Filled 2016-03-22: qty 1

## 2016-03-22 MED ORDER — OSELTAMIVIR PHOSPHATE 30 MG PO CAPS
30.0000 mg | ORAL_CAPSULE | Freq: Two times a day (BID) | ORAL | Status: DC
  Administered 2016-03-22 – 2016-03-23 (×2): 30 mg via ORAL
  Filled 2016-03-22 (×2): qty 1

## 2016-03-22 MED ORDER — POTASSIUM CHLORIDE 20 MEQ/15ML (10%) PO SOLN
40.0000 meq | Freq: Once | ORAL | Status: AC
  Administered 2016-03-22: 40 meq via ORAL
  Filled 2016-03-22: qty 30

## 2016-03-22 MED ORDER — GUAIFENESIN-DM 100-10 MG/5ML PO SYRP
5.0000 mL | ORAL_SOLUTION | Freq: Four times a day (QID) | ORAL | Status: DC
  Administered 2016-03-22 – 2016-03-23 (×5): 5 mL via ORAL
  Filled 2016-03-22 (×5): qty 10

## 2016-03-22 MED ORDER — ENOXAPARIN SODIUM 30 MG/0.3ML ~~LOC~~ SOLN
30.0000 mg | SUBCUTANEOUS | Status: DC
  Administered 2016-03-22 – 2016-03-23 (×2): 30 mg via SUBCUTANEOUS
  Filled 2016-03-22 (×2): qty 0.3

## 2016-03-22 NOTE — Progress Notes (Signed)
OT Cancellation Note  Patient Details Name: Savannah Diaz MRN: [id number] DOB: 1933-01-11   Cancelled Treatment:    Reason Eval/Treat Not Completed: OT screened, no needs identified, will sign off  [place], Thereasa Parkin 03/22/2016, 12:59 PM

## 2016-03-22 NOTE — Progress Notes (Signed)
PHARMACY NOTE:  ANTIMICROBIAL RENAL DOSAGE ADJUSTMENT  Current antimicrobial regimen includes a mismatch between antimicrobial dosage and estimated renal function.  As per policy approved by the Pharmacy & Therapeutics and Medical Executive Committees, the antimicrobial dosage will be adjusted accordingly.  Current antimicrobial dosages:   Oseltamivir 75 mg BID x 5 days Piperacillin / tazobactam 3.375 grams IV q8h, each dose infused over 4 hours  Indication:  Confirmed influenza A UTI Possible aspiration pneumonia  Estimated Renal Function:   Estimated Creatinine Clearance: 14.5 mL/min (by C-G formula based on SCr of 2.18 mg/dL (H)). []      On intermittent HD, scheduled: []      On CRRT    However, admitting MD notes that patient's baseline SCr is 1.2 and suspects current SCr elevation above that reflects prerenal azotemia.  Using patient's baseline SCr of 1.2, estimated CrCl is 31 mL/min.  Antimicrobial dosage has been changed to:   Oseltamivir 30 mg BID (for estimated CrCl 30-60 mL/min) to complete 5 days. Continuing Zosyn at current dosage for estimated CrCl > 20 mL/min   Additional Comments:  Will recheck SCr on 1/28 for further adjustment of antimicrobial dosages.   Thank you for allowing pharmacy to be a part of this patient's care.  Clayburn Pert, PharmD, BCPS Pager: [phone] 03/22/2016  6:23 AM

## 2016-03-22 NOTE — Evaluation (Signed)
Physical Therapy One Time Evaluation Patient Details Name: Savannah Diaz MRN: [id number] DOB: [birth year] Today's Date: 03/22/2016   History of Present Illness  83 y.o. female with medical history significant of hypertension, GERD, depression, gastritis, migraine headache, dysphagia, CKD-II-III, transient global amnesia who presents with  fall, malaise, cough, headache and found to have influenza A and UTI  Clinical Impression  Patient evaluated by Physical Therapy with no further acute PT needs identified. All education has been completed and the patient has no further questions.  Pt mobilizing well and reports she plans to d/c home with her daughter. See below for any follow-up Physical Therapy or equipment needs. PT is signing off. Thank you for this referral.     Follow Up Recommendations No PT follow up    Equipment Recommendations  None recommended by PT    Recommendations for Other Services       Precautions / Restrictions Precautions Precautions: Fall Restrictions Weight Bearing Restrictions: No      Mobility  Bed Mobility               General bed mobility comments: pt up in recliner on arrival  Transfers Overall transfer level: Needs assistance Equipment used: None Transfers: Sit to/from Stand Sit to Stand: Min guard;Supervision         General transfer comment: min/guard for safety however pt performs well  Ambulation/Gait Ambulation/Gait assistance: Min guard;Supervision Ambulation Distance (Feet): 200 Feet Assistive device: None Gait Pattern/deviations: Step-through pattern;Decreased stride length     General Gait Details: pt pushed IV pole, denies symptoms, slightly fatigued with distance however tolerated well  Stairs            Wheelchair Mobility    Modified Rankin (Stroke Patients Only)       Balance Overall balance assessment: History of Falls                                           Pertinent  Vitals/Pain Pain Assessment: No/denies pain    Home Living Family/patient expects to be discharged to:: Private residence Living Arrangements: Alone   Type of Home: [place]: One level Home Equipment: None Additional Comments: Pt plans to d/c home with daughter    Prior Function Level of Independence: Independent               Hand Dominance        Extremity/Trunk Assessment   Upper Extremity Assessment Upper Extremity Assessment: Overall WFL for tasks assessed    Lower Extremity Assessment Lower Extremity Assessment: Overall WFL for tasks assessed       Communication   Communication: No difficulties  Cognition Arousal/Alertness: Awake/alert Behavior During Therapy: WFL for tasks assessed/performed Overall Cognitive Status: Within Functional Limits for tasks assessed                      General Comments General comments (skin integrity, edema, etc.): pt reports fall prior to admission "just went down" likely from generalized weakness from UTI and flu    Exercises     Assessment/Plan    PT Assessment Patent does not need any further PT services  PT Problem List            PT Treatment Interventions      PT Goals (Current goals can be found in the Care Plan  section)  Acute Rehab PT Goals PT Goal Formulation: All assessment and education complete, DC therapy    Frequency     Barriers to discharge        Co-evaluation               End of Session   Activity Tolerance: Patient tolerated treatment well Patient left: in chair;with call bell/phone within reach;with chair alarm set Nurse Communication: Mobility status    Functional Assessment Tool Used: clinical judgement Functional Limitation: Mobility: Walking and moving around Mobility: Walking and Moving Around Current Status [id number]): At least 1 percent but less than 20 percent impaired, limited or restricted Mobility: Walking and Moving Around Goal Status  [phone]): At least 1 percent but less than 20 percent impaired, limited or restricted Mobility: Walking and Moving Around Discharge Status [phone]): At least 1 percent but less than 20 percent impaired, limited or restricted    Time: 0930-0949 PT Time Calculation (min) (ACUTE ONLY): 19 min   Charges:   PT Evaluation $PT Eval Low Complexity: 1 Procedure     PT G Codes:   PT G-Codes **NOT FOR INPATIENT CLASS** Functional Assessment Tool Used: clinical judgement Functional Limitation: Mobility: Walking and moving around Mobility: Walking and Moving Around Current Status [id number]): At least 1 percent but less than 20 percent impaired, limited or restricted Mobility: Walking and Moving Around Goal Status [phone]): At least 1 percent but less than 20 percent impaired, limited or restricted Mobility: Walking and Moving Around Discharge Status [phone]): At least 1 percent but less than 20 percent impaired, limited or restricted    Darlene Brozowski,KATHrine E 03/22/2016, 12:26 PM Carmelia Bake, PT, DPT 03/22/2016 Pager: [phone]

## 2016-03-22 NOTE — Progress Notes (Signed)
PROGRESS NOTE [facility]   CHARIAH MUHAMMAD   [id number] DOB: November 07, 1932  DOA: 03/21/2016 PCP: Nyoka Cowden, MD   Brief Narrative:  PLUMER ERICKSEN is a 83 y.o. female with medical history significant of hypertension, GERD, depression, gastritis, migraine headache, dysphagia, CKD-II-III, who presents with  fall, malaise, cough, headache for about a week PTA. Patient was found to be influenza A positive. Also on CXR found to have PNA and UTI on UA  Patient admitted with PNA, UTI, Influenza and AKI - started on IVF, IV abx and Tamiflu.  Subjective: Patient seen and examined with nurse at beside, Patient report feeling much better. Have no complaints.   Assessment & Plan: Influenza A with superimpose PNA - left lower lobe Continue tamiflu  Continue IV abx - Zosyn for now will add Azithromycin to cover for atypical  Follow up blood cultures  Strep antigen negative  Check procalcitonin   UTI:  On IV abx  Follow up urine cultures   Dysphagia: pt and her daughter refused SLP Has been work up as outpatient  Food thickening   Fall: This is likely due to generalized weakness secondary to UTI and Flu A. no traumatic injury on CT had a CT of C-spine. PT evaluated no needs for Physical therapy   HTN: - stable  continue Atenolol  Hypokalemia: K=3.2 on admission. Repleted  AoCKD-II-III: Baseline Cre is 1.2 on 04/22/15, pt's Cre is 2.18on admission. - likely due to dehydration  Repeat BMP  Continue IVF  Check FeNa  DVT prophylaxis: Lovenox Code Status: DNR Family Communication: Discussed with daughter by phone  Disposition Plan: Home in 24-48 hrs    Consultants:   None   Procedures:   None   Antimicrobials:  Zosyn 03/21/16  Tamiflu 03/21/16  Azithro 03/22/16  Objective: Vitals:   03/21/16 2016 03/22/16 0200  BP: 126/64 (!) 154/91  Pulse: 72 70  Resp: 14 16  Temp: 98.2 F (36.8 C) 98.5 F (36.9 C)  TempSrc: Oral Oral  SpO2: 96% 98%    Weight:  52.8 kg (116 lb 6.5 oz)  Height:  5' (1.524 m)    Intake/Output Summary (Last 24 hours) at 03/22/16 1052 Last data filed at 03/22/16 0740  Gross per 24 hour  Intake           481.67 ml  Output             1150 ml  Net          -668.33 ml   Filed Weights   03/22/16 0200  Weight: 52.8 kg (116 lb 6.5 oz)    Examination:  General exam: Appears calm and comfortable  HEENT: AC/AT, PERRLA, OP moist and clear Respiratory system: Decrease breath sound at the left lower base, mild rales. R side CTA  Cardiovascular system: S1 & S2 heard, RRR. No JVD, murmurs, rubs or gallops Gastrointestinal system: Abdomen is nondistended, soft and nontender. Normal bowel sounds heard. Central nervous system: Alert and oriented. No focal neurological deficits. Extremities: No pedal edema.  Skin: No rashes, lesions or ulcers Psychiatry: Judgement and insight appear normal. Mood & affect appropriate.    Data Reviewed: I have personally reviewed following labs and imaging studies  CBC:  Recent Labs Lab 03/21/16 2152  WBC 5.8  NEUTROABS 3.9  HGB 12.9  HCT 38.6  MCV 88.7  PLT 0000000   Basic Metabolic Panel:  Recent Labs Lab 03/21/16 2152 03/22/16 0222  NA 137  --   K 3.2*  --  CL 100*  --   CO2 27  --   GLUCOSE 121*  --   BUN 51*  --   CREATININE 2.18*  --   CALCIUM 8.6*  --   MG  --  2.0   GFR: Estimated Creatinine Clearance: 14.5 mL/min (by C-G formula based on SCr of 2.18 mg/dL (H)). Liver Function Tests: No results for input(s): AST, ALT, ALKPHOS, BILITOT, PROT, ALBUMIN in the last 168 hours. No results for input(s): LIPASE, AMYLASE in the last 168 hours. No results for input(s): AMMONIA in the last 168 hours. Coagulation Profile: No results for input(s): INR, PROTIME in the last 168 hours. Cardiac Enzymes:  Recent Labs Lab 03/21/16 2152  TROPONINI <0.03   BNP (last 3 results) No results for input(s): PROBNP in the last 8760 hours. HbA1C: No results for  input(s): HGBA1C in the last 72 hours. CBG: No results for input(s): GLUCAP in the last 168 hours. Lipid Profile: No results for input(s): CHOL, HDL, LDLCALC, TRIG, CHOLHDL, LDLDIRECT in the last 72 hours. Thyroid Function Tests: No results for input(s): TSH, T4TOTAL, FREET4, T3FREE, THYROIDAB in the last 72 hours. Anemia Panel: No results for input(s): VITAMINB12, FOLATE, FERRITIN, TIBC, IRON, RETICCTPCT in the last 72 hours. Sepsis Labs: No results for input(s): PROCALCITON, LATICACIDVEN in the last 168 hours.  No results found for this or any previous visit (from the past 240 hour(s)).     Radiology Studies: Dg Chest 2 View  Result Date: 03/21/2016 CLINICAL DATA:  Decreased appetite.  Minor cough. EXAM: CHEST  2 VIEW COMPARISON:  05/12/2012 FINDINGS: Hyperexpansion suggests emphysema. No pulmonary edema or pleural effusion. Patchy airspace disease lateral left lung base suggests pneumonia. The cardiopericardial silhouette is within normal limits for size. The visualized bony structures of the thorax are intact. IMPRESSION: Patchy airspace disease at the left base concerning for pneumonia. Electronically Signed   By: Misty Stanley M.D.   On: 03/21/2016 21:45   Ct Head Wo Contrast  Result Date: 03/22/2016 CLINICAL DATA:  Evaluation for acute trauma, fall. EXAM: CT HEAD WITHOUT CONTRAST CT CERVICAL SPINE WITHOUT CONTRAST TECHNIQUE: Multidetector CT imaging of the head and cervical spine was performed following the standard protocol without intravenous contrast. Multiplanar CT image reconstructions of the cervical spine were also generated. COMPARISON:  Prior MRI from 04/22/2015. FINDINGS: CT HEAD FINDINGS Brain: Generalized age-related cerebral atrophy with moderate chronic microvascular ischemic disease. No acute intracranial hemorrhage. No findings to suggest acute large vessel territory infarct. No mass lesion, midline shift or mass effect. No hydrocephalus. No extra-axial fluid collection.  Vascular: Vascular calcifications present within the carotid siphons. No hyperdense vessel. Skull: Scalp soft tissues within normal limits.  Calvarium intact. Sinuses/Orbits: Globes and orbital soft tissues demonstrate no acute abnormality. Patient is status post lens extraction on the left. Paranasal sinuses and mastoids are clear. CT CERVICAL SPINE FINDINGS Alignment: Reversal of the normal cervical lordosis, apex at C4. Trace anterolisthesis of C2 on C3. Grade 1 anterolisthesis of C7 on T1 also present. Findings likely chronic and facet mediated. Skull base and vertebrae: Skullbase intact. Normal C1-2 articulations preserved. Dens is intact. Vertebral body heights maintained. No acute fracture. Soft tissues and spinal canal: Visualized soft tissues of the neck demonstrate no acute abnormality. No prevertebral edema. Vascular calcifications about the carotid bifurcations. Disc levels: Advanced degenerative spondylolysis present at C4-5 through C6-7. Multilevel facet arthrosis, most notable at C7-T1. Upper chest: Visualized upper chest within normal limits. Visualized lungs are clear. IMPRESSION: CT BRAIN: 1. No acute intracranial  process identified. 2. Moderate chronic microvascular ischemic disease. CT CERVICAL SPINE: 1. No acute traumatic injury within the cervical spine. 2. Advanced degenerative spondylolysis at C4-5 through C6-7. Electronically Signed   By: Jeannine Boga M.D.   On: 03/22/2016 00:23   Ct Cervical Spine Wo Contrast  Result Date: 03/22/2016 CLINICAL DATA:  Evaluation for acute trauma, fall. EXAM: CT HEAD WITHOUT CONTRAST CT CERVICAL SPINE WITHOUT CONTRAST TECHNIQUE: Multidetector CT imaging of the head and cervical spine was performed following the standard protocol without intravenous contrast. Multiplanar CT image reconstructions of the cervical spine were also generated. COMPARISON:  Prior MRI from 04/22/2015. FINDINGS: CT HEAD FINDINGS Brain: Generalized age-related cerebral  atrophy with moderate chronic microvascular ischemic disease. No acute intracranial hemorrhage. No findings to suggest acute large vessel territory infarct. No mass lesion, midline shift or mass effect. No hydrocephalus. No extra-axial fluid collection. Vascular: Vascular calcifications present within the carotid siphons. No hyperdense vessel. Skull: Scalp soft tissues within normal limits.  Calvarium intact. Sinuses/Orbits: Globes and orbital soft tissues demonstrate no acute abnormality. Patient is status post lens extraction on the left. Paranasal sinuses and mastoids are clear. CT CERVICAL SPINE FINDINGS Alignment: Reversal of the normal cervical lordosis, apex at C4. Trace anterolisthesis of C2 on C3. Grade 1 anterolisthesis of C7 on T1 also present. Findings likely chronic and facet mediated. Skull base and vertebrae: Skullbase intact. Normal C1-2 articulations preserved. Dens is intact. Vertebral body heights maintained. No acute fracture. Soft tissues and spinal canal: Visualized soft tissues of the neck demonstrate no acute abnormality. No prevertebral edema. Vascular calcifications about the carotid bifurcations. Disc levels: Advanced degenerative spondylolysis present at C4-5 through C6-7. Multilevel facet arthrosis, most notable at C7-T1. Upper chest: Visualized upper chest within normal limits. Visualized lungs are clear. IMPRESSION: CT BRAIN: 1. No acute intracranial process identified. 2. Moderate chronic microvascular ischemic disease. CT CERVICAL SPINE: 1. No acute traumatic injury within the cervical spine. 2. Advanced degenerative spondylolysis at C4-5 through C6-7. Electronically Signed   By: Jeannine Boga M.D.   On: 03/22/2016 00:23     Scheduled Meds: . amitriptyline  250 mg Oral QHS  . atenolol  75 mg Oral Daily  . enoxaparin (LOVENOX) injection  30 mg Subcutaneous Q24H  . feeding supplement (ENSURE ENLIVE)  237 mL Oral BID BM  . guaiFENesin-dextromethorphan  5 mL Oral QID  .  oseltamivir  30 mg Oral BID  . pantoprazole  40 mg Oral Daily  . piperacillin-tazobactam (ZOSYN)  IV  3.375 g Intravenous Q8H   Continuous Infusions: . sodium chloride 100 mL/hr at 03/22/16 0343     LOS: 0 days    Chipper Oman, MD [facility] Pager [phone]  If 7PM-7AM, please contact night-coverage [email or web address] Password [facility] 03/22/2016, 10:52 AM

## 2016-03-23 DIAGNOSIS — W19XXXD Unspecified fall, subsequent encounter: Secondary | ICD-10-CM

## 2016-03-23 LAB — URINE CULTURE

## 2016-03-23 LAB — CBC
HEMATOCRIT: 30.4 % — AB (ref 36.0–46.0)
HEMOGLOBIN: 10 g/dL — AB (ref 12.0–15.0)
MCH: 28.9 pg (ref 26.0–34.0)
MCHC: 32.9 g/dL (ref 30.0–36.0)
MCV: 87.9 fL (ref 78.0–100.0)
Platelets: 160 10*3/uL (ref 150–400)
RBC: 3.46 MIL/uL — AB (ref 3.87–5.11)
RDW: 14.2 % (ref 11.5–15.5)
WBC: 3.8 10*3/uL — AB (ref 4.0–10.5)

## 2016-03-23 LAB — BASIC METABOLIC PANEL
ANION GAP: 4 — AB (ref 5–15)
BUN: 15 mg/dL (ref 6–20)
CALCIUM: 7.2 mg/dL — AB (ref 8.9–10.3)
CO2: 28 mmol/L (ref 22–32)
Chloride: 112 mmol/L — ABNORMAL HIGH (ref 101–111)
Creatinine, Ser: 1.03 mg/dL — ABNORMAL HIGH (ref 0.44–1.00)
GFR calc non Af Amer: 50 mL/min — ABNORMAL LOW (ref >60)
GFR, EST AFRICAN AMERICAN: 57 mL/min — AB (ref >60)
Glucose, Bld: 89 mg/dL (ref 65–99)
Potassium: 3.2 mmol/L — ABNORMAL LOW (ref 3.5–5.1)
Sodium: 144 mmol/L (ref 135–145)

## 2016-03-23 MED ORDER — POTASSIUM CHLORIDE 2 MEQ/ML IV SOLN
30.0000 meq | Freq: Once | INTRAVENOUS | Status: DC
  Filled 2016-03-23: qty 15

## 2016-03-23 MED ORDER — GUAIFENESIN-DM 100-10 MG/5ML PO SYRP: 5.0000 mL | ORAL_SOLUTION | ORAL | 0 refills | Status: DC | PRN

## 2016-03-23 MED ORDER — POTASSIUM CHLORIDE CRYS ER 20 MEQ PO TBCR
60.0000 meq | EXTENDED_RELEASE_TABLET | Freq: Once | ORAL | Status: AC
  Administered 2016-03-23: 60 meq via ORAL
  Filled 2016-03-23: qty 3

## 2016-03-23 MED ORDER — OSELTAMIVIR PHOSPHATE 30 MG PO CAPS: 30.0000 mg | ORAL_CAPSULE | Freq: Two times a day (BID) | ORAL | 0 refills | Status: AC

## 2016-03-23 MED ORDER — POTASSIUM CHLORIDE 10 MEQ/100ML IV SOLN
10.0000 meq | INTRAVENOUS | Status: DC
  Administered 2016-03-23: 10 meq via INTRAVENOUS
  Filled 2016-03-23 (×3): qty 100

## 2016-03-23 MED ORDER — AZITHROMYCIN 250 MG PO TABS: ORAL_TABLET | ORAL | 0 refills | Status: DC

## 2016-03-23 NOTE — Progress Notes (Signed)
Initial Nutrition Assessment  INTERVENTION:   Continue Ensure Enlive po BID, each supplement provides 350 kcal and 20 grams of protein Encourage PO intake  NUTRITION DIAGNOSIS:   Inadequate oral intake related to dysphagia as evidenced by per patient/family report.  GOAL:   Patient will meet greater than or equal to 90% of their needs  MONITOR:   PO intake, Supplement acceptance, Labs, Weight trends, I & O's  REASON FOR ASSESSMENT:   Malnutrition Screening Tool    ASSESSMENT:   83 y.o. female with medical history significant of hypertension, GERD, depression, gastritis, migraine headache, dysphagia, CKD-II-III, who presents with  fall, malaise, cough, headache for about a week PTA. Patient was found to be influenza A positive. Also on CXR found to have PNA and UTI on UA  Patient in room with no family at bedside. Pt just received her lunch tray. Pt states her appetite is poor because "it's just not a pleasure to eat anymore". Pt states she has to eat very slowly d/t her dysphagia. When asked if she needs thickened liquids, she declines. Pt states she will graze all day and wouldn't call what she eats as meals. Currently PO intake has ranged from 40-70%. Pt is drinking the Ensure supplements, will continue order. Noted discharge summary in chart.  Per chart review, pt has lost 8 lb since March 2017 (6% wt loss x 10 months, insignificant for time frame). Nutrition focused physical exam shows no sign of depletion of muscle mass or body fat.  Medications reviewed. Labs reviewed: Low K GFR: 50  Diet Order:  Diet Heart Room service appropriate? Yes; Fluid consistency: Thin Diet - low sodium heart healthy  Skin:  Reviewed, no issues  Last BM:  1/27  Height:   Ht Readings from Last 1 Encounters:  03/22/16 5' (1.524 m)    Weight:   Wt Readings from Last 1 Encounters:  03/22/16 116 lb 6.5 oz (52.8 kg)    Ideal Body Weight:  45.5 kg  BMI:  Body mass index is 22.73  kg/m.  Estimated Nutritional Needs:   Kcal:  1400-1600  Protein:  65-75g  Fluid:  1.6L/day  EDUCATION NEEDS:   No education needs identified at this time  Clayton Bibles, MS, RD, LDN Pager: [phone] After Hours Pager: [phone]

## 2016-03-23 NOTE — Discharge Summary (Signed)
Physician Discharge Summary  Savannah Diaz  [id number]  DOB: 05-Nov-1932  DOA: 03/21/2016 PCP: Nyoka Cowden, MD  Admit date: 03/21/2016 Discharge date: 03/23/2016  Admitted From: Home Disposition:  Home  Recommendations for Outpatient Follow-up:  1. Follow up with PCP in 1 week 2. Please obtain BMP/CBC in one week to check hemoglobin, Cr and potassium 3. Please follow up on the following pending results: Final blood cultures  4. Repeay CXR in 3-4 weeks to assure resolution of lung findings.   Discharge Condition: Stable  CODE STATUS: FULL  Diet recommendation: Heart Healthy  Brief/Interim Summary: Savannah Diaz a 83 y.o.femalewith medical history significant of hypertension, GERD, depression, gastritis, migraine headache, dysphagia, CKD-II-III, whopresents with fall, malaise, cough, headache for about a week PTA. Patient was found to be influenza A positive. Also on CXR found to have PNA and UTI on UA - Patient was treated with IV abx and Tamiflu. She was aslo given IVF for AKI. Patient subsequently improved and feeling well. Patient will be discharge home with PCP follow up.   Subjective: Patient seen and examined doing much better, only with mild cough. Remains afebrile. Not requiring O2 supplementation.   Discharge Diagnoses/Hospital Course:  Influenza A with superimpose PNA - left lower lobe - patient breathing on at room air, and afebrile  Continue tamiflu total of five days  Patient initially treated with Zosyn - Azithro was added  Continue azithromycin for 3 more days  Follow up blood cultures - no growth UTD  Strep antigen negative  Procalcitonin < 10   UTI: - by UA Patient treated with IV Zosyn for 3 days - should be enough treatment - patient asymptomatic  Urine cultures < 10,000 colonies.  Follow up with PCP   Dysphagia:pt and her daughter refused SLP Has been work up as outpatient  Food thickening   Fall: This is likely due to  generalized weakness secondary to UTI and Flu A. no traumatic injury on CT had a CT of C-spine. PT evaluated no needs for physical therapy   HTN: - stable  continue Atenolol  Hypokalemia: K=3.2on admission. Repleted  AoCKD-II-III: Baseline Cre is 1.2 on 04/22/15, pt's Cre is 2.18on admission. - likely due to dehydration  Cr back to baseline  Repeat BMP in 1 week    Discharge Instructions  You were cared for by a hospitalist during your hospital stay. If you have any questions about your discharge medications or the care you received while you were in the hospital after you are discharged, you can call the unit and asked to speak with the hospitalist on call if the hospitalist that took care of you is not available. Once you are discharged, your primary care physician will handle any further medical issues. Please note that NO REFILLS for any discharge medications will be authorized once you are discharged, as it is imperative that you return to your primary care physician (or establish a relationship with a primary care physician if you do not have one) for your aftercare needs so that they can reassess your need for medications and monitor your lab values.  Discharge Instructions    Call MD for:  difficulty breathing, headache or visual disturbances    Complete by:  As directed    Call MD for:  extreme fatigue    Complete by:  As directed    Call MD for:  hives    Complete by:  As directed    Call MD for:  persistant dizziness or  light-headedness    Complete by:  As directed    Call MD for:  persistant nausea and vomiting    Complete by:  As directed    Call MD for:  redness, tenderness, or signs of infection (pain, swelling, redness, odor or green/yellow discharge around incision site)    Complete by:  As directed    Call MD for:  severe uncontrolled pain    Complete by:  As directed    Call MD for:  temperature >100.4    Complete by:  As directed    Diet - low sodium heart  healthy    Complete by:  As directed    Discharge instructions    Complete by:  As directed    Increase activity slowly    Complete by:  As directed      Allergies as of 03/23/2016      Reactions   Lactose Intolerance (gi) Other (See Comments)   Gi tract issues   Sulfa Antibiotics Hives   Childhood allergy       Medication List    STOP taking these medications   diazepam 5 MG tablet Commonly known as:  VALIUM   levETIRAcetam 100 MG/ML solution Commonly known as:  KEPPRA     TAKE these medications   amitriptyline 50 MG tablet Commonly known as:  ELAVIL Take 250 mg by mouth at bedtime. Takes 5 tabs nightly-current dosage   atenolol 50 MG tablet Commonly known as:  TENORMIN Take 1.5 tablets (75 mg total) by mouth daily.   azithromycin 250 MG tablet Commonly known as:  ZITHROMAX Take 1 pill per day Start taking on:  03/24/2016   feeding supplement (ENSURE COMPLETE) Liqd Take 237 mLs by mouth 3 (three) times daily between meals. What changed:  when to take this   guaiFENesin-dextromethorphan 100-10 MG/5ML syrup Commonly known as:  ROBITUSSIN DM Take 5 mLs by mouth every 4 (four) hours as needed for cough.   omeprazole 40 MG capsule Commonly known as:  PRILOSEC Take 40 mg by mouth daily as needed (reflux).   oseltamivir 30 MG capsule Commonly known as:  TAMIFLU Take 1 capsule (30 mg total) by mouth 2 (two) times daily.       Allergies  Allergen Reactions  . Lactose Intolerance (Gi) Other (See Comments)    Gi tract issues  . Sulfa Antibiotics Hives    Childhood allergy     Consultations:  None   Procedures/Studies: Dg Chest 2 View  Result Date: 03/21/2016 CLINICAL DATA:  Decreased appetite.  Minor cough. EXAM: CHEST  2 VIEW COMPARISON:  05/12/2012 FINDINGS: Hyperexpansion suggests emphysema. No pulmonary edema or pleural effusion. Patchy airspace disease lateral left lung base suggests pneumonia. The cardiopericardial silhouette is within normal  limits for size. The visualized bony structures of the thorax are intact. IMPRESSION: Patchy airspace disease at the left base concerning for pneumonia. Electronically Signed   By: Misty Stanley M.D.   On: 03/21/2016 21:45   Ct Head Wo Contrast  Result Date: 03/22/2016 CLINICAL DATA:  Evaluation for acute trauma, fall. EXAM: CT HEAD WITHOUT CONTRAST CT CERVICAL SPINE WITHOUT CONTRAST TECHNIQUE: Multidetector CT imaging of the head and cervical spine was performed following the standard protocol without intravenous contrast. Multiplanar CT image reconstructions of the cervical spine were also generated. COMPARISON:  Prior MRI from 04/22/2015. FINDINGS: CT HEAD FINDINGS Brain: Generalized age-related cerebral atrophy with moderate chronic microvascular ischemic disease. No acute intracranial hemorrhage. No findings to suggest acute large vessel territory infarct.  No mass lesion, midline shift or mass effect. No hydrocephalus. No extra-axial fluid collection. Vascular: Vascular calcifications present within the carotid siphons. No hyperdense vessel. Skull: Scalp soft tissues within normal limits.  Calvarium intact. Sinuses/Orbits: Globes and orbital soft tissues demonstrate no acute abnormality. Patient is status post lens extraction on the left. Paranasal sinuses and mastoids are clear. CT CERVICAL SPINE FINDINGS Alignment: Reversal of the normal cervical lordosis, apex at C4. Trace anterolisthesis of C2 on C3. Grade 1 anterolisthesis of C7 on T1 also present. Findings likely chronic and facet mediated. Skull base and vertebrae: Skullbase intact. Normal C1-2 articulations preserved. Dens is intact. Vertebral body heights maintained. No acute fracture. Soft tissues and spinal canal: Visualized soft tissues of the neck demonstrate no acute abnormality. No prevertebral edema. Vascular calcifications about the carotid bifurcations. Disc levels: Advanced degenerative spondylolysis present at C4-5 through C6-7.  Multilevel facet arthrosis, most notable at C7-T1. Upper chest: Visualized upper chest within normal limits. Visualized lungs are clear. IMPRESSION: CT BRAIN: 1. No acute intracranial process identified. 2. Moderate chronic microvascular ischemic disease. CT CERVICAL SPINE: 1. No acute traumatic injury within the cervical spine. 2. Advanced degenerative spondylolysis at C4-5 through C6-7. Electronically Signed   By: Jeannine Boga M.D.   On: 03/22/2016 00:23   Ct Cervical Spine Wo Contrast  Result Date: 03/22/2016 CLINICAL DATA:  Evaluation for acute trauma, fall. EXAM: CT HEAD WITHOUT CONTRAST CT CERVICAL SPINE WITHOUT CONTRAST TECHNIQUE: Multidetector CT imaging of the head and cervical spine was performed following the standard protocol without intravenous contrast. Multiplanar CT image reconstructions of the cervical spine were also generated. COMPARISON:  Prior MRI from 04/22/2015. FINDINGS: CT HEAD FINDINGS Brain: Generalized age-related cerebral atrophy with moderate chronic microvascular ischemic disease. No acute intracranial hemorrhage. No findings to suggest acute large vessel territory infarct. No mass lesion, midline shift or mass effect. No hydrocephalus. No extra-axial fluid collection. Vascular: Vascular calcifications present within the carotid siphons. No hyperdense vessel. Skull: Scalp soft tissues within normal limits.  Calvarium intact. Sinuses/Orbits: Globes and orbital soft tissues demonstrate no acute abnormality. Patient is status post lens extraction on the left. Paranasal sinuses and mastoids are clear. CT CERVICAL SPINE FINDINGS Alignment: Reversal of the normal cervical lordosis, apex at C4. Trace anterolisthesis of C2 on C3. Grade 1 anterolisthesis of C7 on T1 also present. Findings likely chronic and facet mediated. Skull base and vertebrae: Skullbase intact. Normal C1-2 articulations preserved. Dens is intact. Vertebral body heights maintained. No acute fracture. Soft  tissues and spinal canal: Visualized soft tissues of the neck demonstrate no acute abnormality. No prevertebral edema. Vascular calcifications about the carotid bifurcations. Disc levels: Advanced degenerative spondylolysis present at C4-5 through C6-7. Multilevel facet arthrosis, most notable at C7-T1. Upper chest: Visualized upper chest within normal limits. Visualized lungs are clear. IMPRESSION: CT BRAIN: 1. No acute intracranial process identified. 2. Moderate chronic microvascular ischemic disease. CT CERVICAL SPINE: 1. No acute traumatic injury within the cervical spine. 2. Advanced degenerative spondylolysis at C4-5 through C6-7. Electronically Signed   By: Jeannine Boga M.D.   On: 03/22/2016 00:23    Discharge Exam: Vitals:   03/22/16 2116 03/23/16 0528  BP: 125/83 131/62  Pulse: 78 71  Resp: 18 18  Temp: (!) 100.6 F (38.1 C) 98.9 F (37.2 C)   Vitals:   03/22/16 0200 03/22/16 1555 03/22/16 2116 03/23/16 0528  BP: (!) 154/91 124/87 125/83 131/62  Pulse: 70 76 78 71  Resp: 16 18 18 18   Temp: 98.5 F (  36.9 C) 100.3 F (37.9 C) (!) 100.6 F (38.1 C) 98.9 F (37.2 C)  TempSrc: Oral Oral Oral Oral  SpO2: 98% 97% 94% 97%  Weight: 52.8 kg (116 lb 6.5 oz)     Height: 5' (1.524 m)       General: Pt is alert, awake, not in acute distress Cardiovascular: RRR, S1/S2 +, no rubs, no gallops Respiratory: Decrease breath sound at the left lower lobe, no wheezing, no rhonchi Abdominal: Soft, NT, ND, bowel sounds + Extremities: no edema, no cyanosis    The results of significant diagnostics from this hospitalization (including imaging, microbiology, ancillary and laboratory) are listed below for reference.     Microbiology: Recent Results (from the past 240 hour(s))  Culture, blood (routine x 2) Call MD if unable to obtain prior to antibiotics being given     Status: None (Preliminary result)   Collection Time: 03/22/16  2:22 AM  Result Value Ref Range Status   Specimen  Description BLOOD RIGHT ANTECUBITAL  Final   Special Requests BOTTLES DRAWN AEROBIC ONLY 5ML  Final   Culture   Final    NO GROWTH 1 DAY Performed at [facility], [address], [place], [place] [address]    Report Status PENDING  Incomplete  Culture, blood (routine x 2) Call MD if unable to obtain prior to antibiotics being given     Status: None (Preliminary result)   Collection Time: 03/22/16  2:22 AM  Result Value Ref Range Status   Specimen Description BLOOD RIGHT HAND  Final   Special Requests IN PEDIATRIC BOTTLE 1ML  Final   Culture   Final    NO GROWTH 1 DAY Performed at [facility], [place] [address] [place], [place] [address]    Report Status PENDING  Incomplete  Urine culture     Status: Abnormal   Collection Time: 03/22/16  4:29 AM  Result Value Ref Range Status   Specimen Description URINE, RANDOM  Final   Special Requests NONE  Final   Culture (A)  Final    <10,000 COLONIES/mL INSIGNIFICANT GROWTH Performed at [facility] [address] [place], [place] [address]    Report Status 03/23/2016 FINAL  Final     Labs: BNP (last 3 results) No results for input(s): BNP in the last 8760 hours. Basic Metabolic Panel:  Recent Labs Lab 03/21/16 2152 03/22/16 0222 03/22/16 1541 03/23/16 0508  NA 137  --  141 144  K 3.2*  --  3.3* 3.2*  CL 100*  --  110 112*  CO2 27  --  27 28  GLUCOSE 121*  --  108* 89  BUN 51*  --  24* 15  CREATININE 2.18*  --  1.14* 1.03*  CALCIUM 8.6*  --  7.4* 7.2*  MG  --  2.0  --   --    Liver Function Tests: No results for input(s): AST, ALT, ALKPHOS, BILITOT, PROT, ALBUMIN in the last 168 hours. No results for input(s): LIPASE, AMYLASE in the last 168 hours. No results for input(s): AMMONIA in the last 168 hours. CBC:  Recent Labs Lab 03/21/16 2152 03/23/16 0508  WBC 5.8 3.8*  NEUTROABS 3.9  --   HGB 12.9 10.0*  HCT 38.6 30.4*  MCV 88.7 87.9  PLT 204 160   Cardiac Enzymes:  Recent Labs Lab  03/21/16 2152  TROPONINI <0.03   BNP: Invalid input(s): POCBNP CBG: No results for input(s): GLUCAP in the last 168  hours. D-Dimer No results for input(s): DDIMER in the last 72 hours. Hgb A1c No results for input(s): HGBA1C in the last 72 hours. Lipid Profile No results for input(s): CHOL, HDL, LDLCALC, TRIG, CHOLHDL, LDLDIRECT in the last 72 hours. Thyroid function studies No results for input(s): TSH, T4TOTAL, T3FREE, THYROIDAB in the last 72 hours.  Invalid input(s): FREET3 Anemia work up No results for input(s): VITAMINB12, FOLATE, FERRITIN, TIBC, IRON, RETICCTPCT in the last 72 hours. Urinalysis    Component Value Date/Time   COLORURINE YELLOW 03/22/2016 0153   APPEARANCEUR CLEAR 03/22/2016 0153   LABSPEC 1.009 03/22/2016 0153   PHURINE 5.0 03/22/2016 0153   GLUCOSEU NEGATIVE 03/22/2016 0153   HGBUR NEGATIVE 03/22/2016 0153   BILIRUBINUR NEGATIVE 03/22/2016 0153   KETONESUR NEGATIVE 03/22/2016 0153   PROTEINUR NEGATIVE 03/22/2016 0153   UROBILINOGEN 0.2 05/14/2012 1317   NITRITE NEGATIVE 03/22/2016 0153   LEUKOCYTESUR LARGE (A) 03/22/2016 0153   Sepsis Labs Invalid input(s): PROCALCITONIN,  WBC,  LACTICIDVEN Microbiology Recent Results (from the past 240 hour(s))  Culture, blood (routine x 2) Call MD if unable to obtain prior to antibiotics being given     Status: None (Preliminary result)   Collection Time: 03/22/16  2:22 AM  Result Value Ref Range Status   Specimen Description BLOOD RIGHT ANTECUBITAL  Final   Special Requests BOTTLES DRAWN AEROBIC ONLY 5ML  Final   Culture   Final    NO GROWTH 1 DAY Performed at [facility], [place] [address] [place], [place] [address]    Report Status PENDING  Incomplete  Culture, blood (routine x 2) Call MD if unable to obtain prior to antibiotics being given     Status: None (Preliminary result)   Collection Time: 03/22/16  2:22 AM  Result Value Ref Range Status   Specimen Description BLOOD RIGHT HAND  Final    Special Requests IN PEDIATRIC BOTTLE 1ML  Final   Culture   Final    NO GROWTH 1 DAY Performed at [facility], [place] [address], [place], [place] [address]    Report Status PENDING  Incomplete  Urine culture     Status: Abnormal   Collection Time: 03/22/16  4:29 AM  Result Value Ref Range Status   Specimen Description URINE, RANDOM  Final   Special Requests NONE  Final   Culture (A)  Final    <10,000 COLONIES/mL INSIGNIFICANT GROWTH Performed at [facility] [address] [place], [place] [address]    Report Status 03/23/2016 FINAL  Final    Time coordinating discharge: Over 30 minutes  SIGNED:  Chipper Oman, MD  [facility] 03/23/2016, 1:42 PM Pager   If 7PM-7AM, please contact night-coverage [email or web address] Password [email or web address]

## 2016-03-24 ENCOUNTER — Encounter: Payer: Self-pay | Admitting: Internal Medicine

## 2016-03-25 ENCOUNTER — Encounter: Payer: Self-pay | Admitting: Internal Medicine

## 2016-03-27 ENCOUNTER — Ambulatory Visit (INDEPENDENT_AMBULATORY_CARE_PROVIDER_SITE_OTHER): Payer: Medicare Other | Admitting: Internal Medicine

## 2016-03-27 ENCOUNTER — Encounter: Payer: Self-pay | Admitting: Internal Medicine

## 2016-03-27 VITALS — BP 122/70 | HR 83 | Temp 98.6°F | Ht 60.0 in | Wt 114.2 lb

## 2016-03-27 DIAGNOSIS — J690 Pneumonitis due to inhalation of food and vomit: Secondary | ICD-10-CM

## 2016-03-27 DIAGNOSIS — J101 Influenza due to other identified influenza virus with other respiratory manifestations: Secondary | ICD-10-CM | POA: Diagnosis not present

## 2016-03-27 DIAGNOSIS — J189 Pneumonia, unspecified organism: Secondary | ICD-10-CM | POA: Diagnosis not present

## 2016-03-27 DIAGNOSIS — E876 Hypokalemia: Secondary | ICD-10-CM | POA: Diagnosis not present

## 2016-03-27 LAB — CULTURE, BLOOD (ROUTINE X 2)
CULTURE: NO GROWTH
Culture: NO GROWTH

## 2016-03-27 MED ORDER — DIPHENOXYLATE-ATROPINE 2.5-0.025 MG PO TABS: 1.0000 | ORAL_TABLET | Freq: Four times a day (QID) | ORAL | 0 refills | Status: DC | PRN

## 2016-03-27 NOTE — Progress Notes (Signed)
Subjective:    Patient ID: Savannah Diaz, female    DOB: 1932-12-19, 83 y.o.   MRN: [id number]  HPI  Admit date: 03/21/2016 Discharge date: 03/23/2016  Admitted From: Home Disposition:  Home  Recommendations for Outpatient Follow-up:  1. Follow up with PCP in 1 week 2. Please obtain BMP/CBC in one week to check hemoglobin, Cr and potassium 3. Please follow up on the following pending results: Final blood cultures  4. Repeay CXR in 3-4 weeks to assure resolution of lung findings.   Discharge Condition: Stable  CODE STATUS: FULL  Diet recommendation: Heart Healthy  Brief/Interim Summary: Savannah Diaz a 83 y.o.femalewith medical history significant of hypertension, GERD, depression, gastritis, migraine headache, dysphagia, CKD-II-III, whopresents with fall, malaise, cough, headache for about a week PTA. Patient was found to be influenza A positive. Also on CXR found to have PNA and UTI on UA - Patient was treated with IV abx and Tamiflu. She was aslo given IVF for AKI. Patient subsequently improved and feeling well. Patient will be discharge home with PCP follow up.    83 year old patient who is seen today following a recent hospital discharge.  She has completed Tamiflu and antibiotic therapy.  She continues to improve.  Her appetite remains poor.  She is having some difficulty with frequent semisolid stool causing hygiene challenges.  She is also developed some perineal irritation.  Her bowel habits are improving, however She is accompanied by her daughter today who is assisting with her home care  Hospital records reviewed  Past Medical History:  Diagnosis Date  . Anxiety   . Arthritis    "qwhere; but it doesn't hurt" (05/12/2012)  . Cataract    left  . Depression   . Dysphagia    "have had it for 7 yr; got worse 4 days ago" (05/12/2012)  . GERD (gastroesophageal reflux disease)   . Hypertension   . Migraines    "menopause cured them" (05/12/2012)  .  Osteoporosis      Social History   Social History  . Marital status: Widowed    Spouse name: N/A  . Number of children: 2  . Years of education: N/A   Occupational History  . Retired    Social History Main Topics  . Smoking status: Never Smoker  . Smokeless tobacco: Never Used  . Alcohol use No     Comment: 05/12/2012 "have a drink hardly ever anymore"  . Drug use: No  . Sexual activity: No   Other Topics Concern  . Not on file   Social History Narrative  . No narrative on file    Past Surgical History:  Procedure Laterality Date  . CATARACT EXTRACTION W/ INTRAOCULAR LENS IMPLANT Left ? 2010  . ESOPHAGOGASTRODUODENOSCOPY N/A 05/10/2012   Procedure: ESOPHAGOGASTRODUODENOSCOPY (EGD);  Surgeon: Jerene Bears, MD;  Location: [facility];  Service: Gastroenterology;  Laterality: N/A;  . TONSILLECTOMY  1940's    Family History  Problem Relation Age of Onset  . Arthritis Mother   . Hypertension Mother   . Dementia Mother   . Cancer Father     colon     Allergies  Allergen Reactions  . Lactose Intolerance (Gi) Other (See Comments)    Gi tract issues  . Sulfa Antibiotics Hives    Childhood allergy     Current Outpatient Prescriptions on File Prior to Visit  Medication Sig Dispense Refill  . amitriptyline (ELAVIL) 50 MG tablet Take 250 mg by mouth at bedtime. Takes  5 tabs nightly-current dosage    . atenolol (TENORMIN) 50 MG tablet Take 1.5 tablets (75 mg total) by mouth daily. 180 tablet 4  . azithromycin (ZITHROMAX) 250 MG tablet Take 1 pill per day 3 each 0  . feeding supplement (ENSURE COMPLETE) LIQD Take 237 mLs by mouth 3 (three) times daily between meals. (Patient taking differently: Take 237 mLs by mouth 2 (two) times daily between meals. )    . guaiFENesin-dextromethorphan (ROBITUSSIN DM) 100-10 MG/5ML syrup Take 5 mLs by mouth every 4 (four) hours as needed for cough. 118 mL 0  . omeprazole (PRILOSEC) 40 MG capsule Take 40 mg by mouth daily as needed  (reflux).      No current facility-administered medications on file prior to visit.     BP 122/70 (BP Location: Right Arm, Patient Position: Sitting, Cuff Size: Normal)   Pulse 83   Temp 98.6 F (37 C) (Oral)   Ht 5' (1.524 m)   Wt 114 lb 3.2 oz (51.8 kg)   SpO2 97%   BMI 22.30 kg/m     Review of Systems  Constitutional: Positive for activity change, appetite change and fatigue.  Gastrointestinal:       Semisolid frequent stool, but not diarrhea  Neurological: Positive for weakness.       Objective:   Physical Exam  Constitutional: She is oriented to person, place, and time. She appears well-developed and well-nourished. No distress.  Alert No distress  Afebrile Blood pressure  122/70  HENT:  Head: Normocephalic.  Right Ear: External ear normal.  Left Ear: External ear normal.  Mouth/Throat: Oropharynx is clear and moist.  Eyes: Conjunctivae and EOM are normal. Pupils are equal, round, and reactive to light.  Neck: Normal range of motion. Neck supple. No thyromegaly present.  Cardiovascular: Normal rate, regular rhythm, normal heart sounds and intact distal pulses.   Pulmonary/Chest: Effort normal and breath sounds normal. No respiratory distress. She has no wheezes.  Abdominal: Soft. Bowel sounds are normal. She exhibits no mass. There is no tenderness.  Musculoskeletal: Normal range of motion.  Lymphadenopathy:    She has no cervical adenopathy.  Neurological: She is alert and oriented to person, place, and time.  Skin: Skin is warm and dry. No rash noted.  Psychiatric: She has a normal mood and affect. Her behavior is normal.          Assessment & Plan:   Status post admission for influenza A and secondary pneumonia.  Clinically improved.  Patient has completed antibiotics and Tamiflu Continues to have anorexia and poor oral intake Essential hypertension History of hypokalemia.  We'll check follow-up CBC and chemistries  The patient will advance her  diet as tolerated and force fluids Check lab Follow-up one month and perform follow-up chest x-ray at that time  [facility]

## 2016-03-27 NOTE — Patient Instructions (Addendum)
Drink as much fluid as you  can tolerate over the next few days  Advance diet as tolerated  Call for any clinical worsening  Return in one month for follow-up

## 2016-03-27 NOTE — Progress Notes (Signed)
Pre visit review using our clinic review tool, if applicable. No additional management support is needed unless otherwise documented below in the visit note. 

## 2016-03-28 LAB — CBC WITH DIFFERENTIAL/PLATELET
BASOS PCT: 0.7 % (ref 0.0–3.0)
Basophils Absolute: 0.1 10*3/uL (ref 0.0–0.1)
EOS PCT: 5.7 % — AB (ref 0.0–5.0)
Eosinophils Absolute: 0.4 10*3/uL (ref 0.0–0.7)
HEMATOCRIT: 35.9 % — AB (ref 36.0–46.0)
HEMOGLOBIN: 12.3 g/dL (ref 12.0–15.0)
LYMPHS PCT: 21.6 % (ref 12.0–46.0)
Lymphs Abs: 1.6 10*3/uL (ref 0.7–4.0)
MCHC: 34.1 g/dL (ref 30.0–36.0)
MCV: 88.7 fl (ref 78.0–100.0)
Monocytes Absolute: 0.6 10*3/uL (ref 0.1–1.0)
Monocytes Relative: 8 % (ref 3.0–12.0)
Neutro Abs: 4.7 10*3/uL (ref 1.4–7.7)
Neutrophils Relative %: 64 % (ref 43.0–77.0)
Platelets: 364 10*3/uL (ref 150.0–400.0)
RBC: 4.05 Mil/uL (ref 3.87–5.11)
RDW: 13.7 % (ref 11.5–15.5)
WBC: 7.4 10*3/uL (ref 4.0–10.5)

## 2016-03-28 LAB — BASIC METABOLIC PANEL
BUN: 14 mg/dL (ref 6–23)
CHLORIDE: 104 meq/L (ref 96–112)
CO2: 29 mEq/L (ref 19–32)
Calcium: 9.2 mg/dL (ref 8.4–10.5)
Creatinine, Ser: 1.13 mg/dL (ref 0.40–1.20)
GFR: 49.08 mL/min — ABNORMAL LOW (ref >60.00)
Glucose, Bld: 82 mg/dL (ref 70–99)
POTASSIUM: 3.7 meq/L (ref 3.5–5.1)
Sodium: 142 mEq/L (ref 135–145)

## 2016-04-01 ENCOUNTER — Encounter: Payer: Self-pay | Admitting: Internal Medicine

## 2016-04-04 ENCOUNTER — Other Ambulatory Visit: Payer: Self-pay | Admitting: Internal Medicine

## 2016-04-04 MED ORDER — NYSTATIN 100000 UNIT/ML MT SUSP: 5.0000 mL | Freq: Four times a day (QID) | OROMUCOSAL | 0 refills | Status: DC

## 2016-04-10 ENCOUNTER — Telehealth: Payer: Self-pay | Admitting: Internal Medicine

## 2016-04-10 NOTE — Telephone Encounter (Signed)
Pt is scheduled to see Dr Raliegh Ip tomorrow. Nothing further needed at this time.

## 2016-04-10 NOTE — Telephone Encounter (Signed)
[facility] - Client [place] [facility] Patient Name: Savannah Diaz DOB: [birth year] Initial Comment Caller states she had the flu two weeks ago. She was in the hospital for flu, pneumonia, and UTI. She wants to know if the skin around the anus and vagina should be redder then the rest of her skin. Nurse Assessment Nurse: Markus Daft, RN, Sherre Poot Date/Time (Eastern Time): 04/10/2016 2:58:19 PM Confirm and document reason for call. If symptomatic, describe symptoms. ---Caller states that her skin around the anus and vagina is redder then the rest of her skin. -- She states she had the flu two weeks ago. She was in the hospital for flu, pneumonia, and UTI. s/p diarrhea from antibiotic. Does the patient have any new or worsening symptoms? ---Yes Will a triage be completed? ---Yes Related visit to physician within the last 2 weeks? ---Yes Does the PT have any chronic conditions? (i.e. diabetes, asthma, etc.) ---No Is this a behavioral health or substance abuse call? ---No Guidelines Guideline Title Affirmed Question Affirmed Notes [place] Symptoms Genital area looks infected (e.g., draining sore, spreading redness) Final Disposition User See Physician within [place], [place], Sherre Poot Comments She will call back to set up appt time. Referrals REFERRED TO PCP OFFICE Disagree/Comply: Comply

## 2016-04-11 ENCOUNTER — Ambulatory Visit (INDEPENDENT_AMBULATORY_CARE_PROVIDER_SITE_OTHER): Payer: Medicare Other | Admitting: Internal Medicine

## 2016-04-11 ENCOUNTER — Encounter: Payer: Self-pay | Admitting: Internal Medicine

## 2016-04-11 VITALS — BP 136/68 | HR 90 | Temp 98.3°F | Ht 60.0 in | Wt 117.4 lb

## 2016-04-11 DIAGNOSIS — B3731 Acute candidiasis of vulva and vagina: Secondary | ICD-10-CM

## 2016-04-11 DIAGNOSIS — I10 Essential (primary) hypertension: Secondary | ICD-10-CM | POA: Diagnosis not present

## 2016-04-11 DIAGNOSIS — B373 Candidiasis of vulva and vagina: Secondary | ICD-10-CM

## 2016-04-11 MED ORDER — TRIAMCINOLONE 0.1 % CREAM:EUCERIN CREAM 1:1: 1.0000 "application " | TOPICAL_CREAM | Freq: Two times a day (BID) | CUTANEOUS | 1 refills | Status: DC

## 2016-04-11 MED ORDER — NYSTATIN-TRIAMCINOLONE 100000-0.1 UNIT/GM-% EX CREA: 1.0000 "application " | TOPICAL_CREAM | Freq: Two times a day (BID) | CUTANEOUS | 0 refills | Status: DC

## 2016-04-11 NOTE — Patient Instructions (Signed)
Follow these instructions at home:  Take or apply over-the-counter and prescription medicines only as told by your health care provider.    Do not wear tight clothes, such as pantyhose or tight pants.  Avoid using tampons until your health care provider approves.  Eat more yogurt. This may help to keep your yeast infection from returning.  Try taking a sitz bath to help with discomfort. This is a warm water bath that is taken while you are sitting down. The water should only come up to your hips and should cover your buttocks. Do this 3-4 times per day or as told by your health care provider.  Do not douche.  Wear breathable, cotton underwear.  If you have diabetes, keep your blood sugar levels under control. Contact a health care provider if:  You have a fever.  Your symptoms go away and then return.  Your symptoms do not get better with treatment.  Your symptoms get worse.  You have new symptoms.  You develop blisters in or around your vagina.  You have blood coming from your vagina and it is not your menstrual period.  You develop pain in your abdomen.

## 2016-04-11 NOTE — Progress Notes (Signed)
Pre visit review using our clinic review tool, if applicable. No additional management support is needed unless otherwise documented below in the visit note. 

## 2016-04-11 NOTE — Progress Notes (Signed)
Subjective:    Patient ID: Savannah Diaz, female    DOB: 10-18-1932, 83 y.o.   MRN: [id number]  HPI  83 year old patient who has recently been treated with antibiotic therapy for pneumonia. Over the past 2 weeks.  She is developed increasing redness and itching in the perineal and vulvar area.  There is been no vaginal discharge She has required treatment for thrush recently  Past Medical History:  Diagnosis Date  . Anxiety   . Arthritis    "qwhere; but it doesn't hurt" (05/12/2012)  . Cataract    left  . Depression   . Dysphagia    "have had it for 7 yr; got worse 4 days ago" (05/12/2012)  . GERD (gastroesophageal reflux disease)   . Hypertension   . Migraines    "menopause cured them" (05/12/2012)  . Osteoporosis      Social History   Social History  . Marital status: Widowed    Spouse name: N/A  . Number of children: 2  . Years of education: N/A   Occupational History  . Retired    Social History Main Topics  . Smoking status: Never Smoker  . Smokeless tobacco: Never Used  . Alcohol use No     Comment: 05/12/2012 "have a drink hardly ever anymore"  . Drug use: No  . Sexual activity: No   Other Topics Concern  . Not on file   Social History Narrative  . No narrative on file    Past Surgical History:  Procedure Laterality Date  . CATARACT EXTRACTION W/ INTRAOCULAR LENS IMPLANT Left ? 2010  . ESOPHAGOGASTRODUODENOSCOPY N/A 05/10/2012   Procedure: ESOPHAGOGASTRODUODENOSCOPY (EGD);  Surgeon: Jerene Bears, MD;  Location: [facility];  Service: Gastroenterology;  Laterality: N/A;  . TONSILLECTOMY  1940's    Family History  Problem Relation Age of Onset  . Arthritis Mother   . Hypertension Mother   . Dementia Mother   . Cancer Father     colon     Allergies  Allergen Reactions  . Lactose Intolerance (Gi) Other (See Comments)    Gi tract issues  . Sulfa Antibiotics Hives    Childhood allergy     Current Outpatient Prescriptions on File Prior to  Visit  Medication Sig Dispense Refill  . amitriptyline (ELAVIL) 50 MG tablet Take 250 mg by mouth at bedtime. Takes 5 tabs nightly-current dosage    . atenolol (TENORMIN) 50 MG tablet Take 1.5 tablets (75 mg total) by mouth daily. 180 tablet 4  . diphenoxylate-atropine (LOMOTIL) 2.5-0.025 MG tablet Take 1 tablet by mouth 4 (four) times daily as needed for diarrhea or loose stools. 30 tablet 0  . feeding supplement (ENSURE COMPLETE) LIQD Take 237 mLs by mouth 3 (three) times daily between meals. (Patient taking differently: Take 237 mLs by mouth 2 (two) times daily between meals. )    . omeprazole (PRILOSEC) 40 MG capsule Take 40 mg by mouth daily as needed (reflux).      No current facility-administered medications on file prior to visit.     BP 136/68 (BP Location: Right Arm, Patient Position: Sitting, Cuff Size: Normal)   Pulse 90   Temp 98.3 F (36.8 C) (Oral)   Ht 5' (1.524 m)   Wt 117 lb 6.4 oz (53.3 kg)   SpO2 93%   BMI 22.93 kg/m     Review of Systems  Constitutional: Negative.   HENT: Negative for congestion, dental problem, hearing loss, rhinorrhea, sinus pressure, sore throat and  tinnitus.   Eyes: Negative for pain, discharge and visual disturbance.  Respiratory: Negative for cough and shortness of breath.   Cardiovascular: Negative for chest pain, palpitations and leg swelling.  Gastrointestinal: Negative for abdominal distention, abdominal pain, blood in stool, constipation, diarrhea, nausea and vomiting.  Genitourinary: Negative for difficulty urinating, dysuria, flank pain, frequency, hematuria, pelvic pain, urgency, vaginal bleeding, vaginal discharge and vaginal pain.  Musculoskeletal: Negative for arthralgias, gait problem and joint swelling.  Skin: Positive for rash.  Neurological: Negative for dizziness, syncope, speech difficulty, weakness, numbness and headaches.  Hematological: Negative for adenopathy.  Psychiatric/Behavioral: Negative for agitation,  behavioral problems and dysphoric mood. The patient is not nervous/anxious.        Objective:   Physical Exam  Constitutional: She appears well-developed and well-nourished. No distress.  Genitourinary:  Genitourinary Comments: Marked vulvar erythema.  No vaginal discharge          Assessment & Plan:   Candidal infection.  Will treat with triamcinolone/nystatin cream Essential hypertension, stable  Nyoka Cowden

## 2016-04-15 ENCOUNTER — Telehealth: Payer: Self-pay

## 2016-04-15 NOTE — Telephone Encounter (Signed)
Spoke with pt and she states that she has had athlete's foot off and on for many years. She c/o increased dryness and cracking. She denies any rash, itching or burning. She has applied Lamisil once with no improvement yet. She would like to know what else would be recommended.   Dr. Yong Channel - Would you advise in Dr. Marthann Schiller absence, please and thanks!   [place] Patient Name: Savannah Diaz Gender: Female DOB: Jul 12, 1932 Age: 83 Y 3 M 27 D Return Phone Number: [phone] (Primary), [phone] (Secondary) City/State/Zip: [place] Client [place] [facility] - Client Client Site [place] - Day Physician Simonne Martinet - MD Who Is Calling Patient / Member / Family / Caregiver Call Type Triage / Clinical Relationship To Patient Self Return Phone Number [phone] (Primary) Chief Complaint Foot Pain Reason for Call Symptomatic / Request for [place] states that she has dried cracked skin on her heels.

## 2016-04-15 NOTE — Telephone Encounter (Signed)
There has only been 1 treatment so far. I would recommend she continue the lamisil for a week as long as no redness, fever. Since she has had this issue before- would advise follow up in office with a provider if not improving or if worsens despite treatment

## 2016-04-17 NOTE — Telephone Encounter (Signed)
Spoke with pt and advised. She will continue treatment with Lamisil. Advised her to contact office if symptoms worsen, develops pain/bleeding/weeping. Pt voiced understanding. Nothing further needed at this time.

## 2016-05-11 DIAGNOSIS — I10 Essential (primary) hypertension: Secondary | ICD-10-CM | POA: Diagnosis not present

## 2016-05-11 DIAGNOSIS — R0989 Other specified symptoms and signs involving the circulatory and respiratory systems: Secondary | ICD-10-CM | POA: Diagnosis not present

## 2016-05-12 ENCOUNTER — Emergency Department (HOSPITAL_COMMUNITY)
Admission: EM | Admit: 2016-05-12 | Discharge: 2016-05-12 | Disposition: A | Discharge status: Home / Self Care | Payer: Medicare Other | Attending: Emergency Medicine | Admitting: Emergency Medicine

## 2016-05-12 ENCOUNTER — Encounter (HOSPITAL_COMMUNITY): Payer: Self-pay | Admitting: Emergency Medicine

## 2016-05-12 DIAGNOSIS — N183 Chronic kidney disease, stage 3 (moderate): Secondary | ICD-10-CM | POA: Insufficient documentation

## 2016-05-12 DIAGNOSIS — Z79899 Other long term (current) drug therapy: Secondary | ICD-10-CM | POA: Insufficient documentation

## 2016-05-12 DIAGNOSIS — R1310 Dysphagia, unspecified: Secondary | ICD-10-CM | POA: Insufficient documentation

## 2016-05-12 DIAGNOSIS — I129 Hypertensive chronic kidney disease with stage 1 through stage 4 chronic kidney disease, or unspecified chronic kidney disease: Secondary | ICD-10-CM | POA: Insufficient documentation

## 2016-05-12 MED ORDER — LORAZEPAM 2 MG/ML IJ SOLN: 0.5000 mg | INTRAMUSCULAR | Status: DC | PRN

## 2016-05-12 MED ORDER — LORAZEPAM 2 MG/ML IJ SOLN
0.2500 mg | Freq: Once | INTRAMUSCULAR | Status: AC
  Administered 2016-05-12: 0.25 mg via INTRAVENOUS
  Filled 2016-05-12: qty 1

## 2016-05-12 MED ORDER — SODIUM CHLORIDE 0.9 % IV BOLUS (SEPSIS)
500.0000 mL | Freq: Once | INTRAVENOUS | Status: AC
  Administered 2016-05-12: 500 mL via INTRAVENOUS

## 2016-05-12 NOTE — ED Notes (Signed)
Patient is tolerating sips of water. Reports some dryness initially but denies any major pain.

## 2016-05-12 NOTE — ED Notes (Signed)
PT DISCHARGED. INSTRUCTIONS GIVEN. AAOX4. PT IN NO APPARENT DISTRESS OR PAIN. THE OPPORTUNITY TO ASK QUESTIONS WAS PROVIDED. 

## 2016-05-12 NOTE — ED Provider Notes (Signed)
[facility] DEPT Provider Note   CSN: [id number] Arrival date & time: 05/12/16  0932     History   Chief Complaint Chief Complaint  Patient presents with  . Oral Swelling    HPI Savannah Diaz is a 83 y.o. female. Chief complaint is difficulty swallowing. HPI:  83 year old female who presents with her daughter. They both describe "episodes" where she will feel like it is difficult for her to swallow and she has to spit. She states this happens every few months. She cannot describe any specific inciting events. The daughter states that she was admitted and evaluated with "almost every test could think of". She states "we definitely don't want that again".  Symptoms started this morning. Somewhat she will have some solid food dysphagia and sometimes with pills within it advised that she has some secondary anxiety which contributes to her symptoms.  In review of her chart she was admitted appears ago. She underwent neurological, speech therapy, ENT evaluation.  No sign of stroke. Did have varying degrees of dysphagia on swallowing screen. However, now she states that in between these "episodes" she eats "whatever I want".   Past Medical History:  Diagnosis Date  . Anxiety   . Arthritis    "qwhere; but it doesn't hurt" (05/12/2012)  . Cataract    left  . Depression   . Dysphagia    "have had it for 7 yr; got worse 4 days ago" (05/12/2012)  . GERD (gastroesophageal reflux disease)   . Hypertension   . Migraines    "menopause cured them" (05/12/2012)  . Osteoporosis     Patient Active Problem List   Diagnosis Date Noted  . UTI (urinary tract infection) 03/22/2016  . Influenza A 03/22/2016  . Fall 03/21/2016  . 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 Aspiration pneumonia ([place]) 03/21/2016  . Transient alteration of awareness 05/10/2015  . Dehydration with hypernatremia 05/13/2012  . Hypokalemia 05/12/2012  . Dysphagia 05/10/2012  . Hypertension 04/28/2011  .  Osteoarthritis 04/28/2011  . Agoraphobia 04/28/2011    Past Surgical History:  Procedure Laterality Date  . CATARACT EXTRACTION W/ INTRAOCULAR LENS IMPLANT Left ? 2010  . ESOPHAGOGASTRODUODENOSCOPY N/A 05/10/2012   Procedure: ESOPHAGOGASTRODUODENOSCOPY (EGD);  Surgeon: Jerene Bears, MD;  Location: [facility];  Service: Gastroenterology;  Laterality: N/A;  . TONSILLECTOMY  1940's    OB History    No data available       Home Medications    Prior to Admission medications   Medication Sig Start Date End Date Taking? Authorizing Provider  amitriptyline (ELAVIL) 50 MG tablet Take 250 mg by mouth at bedtime. Takes 5 tabs nightly-current dosage 04/28/11  Yes Marletta Lor, MD  atenolol (TENORMIN) 50 MG tablet Take 1.5 tablets (75 mg total) by mouth daily. 04/28/11  Yes Marletta Lor, MD  diphenoxylate-atropine (LOMOTIL) 2.5-0.025 MG tablet Take 1 tablet by mouth 4 (four) times daily as needed for diarrhea or loose stools. Patient not taking: Reported on 05/12/2016 03/27/16   Marletta Lor, MD  feeding supplement (ENSURE COMPLETE) LIQD Take 237 mLs by mouth 3 (three) times daily between meals. Patient not taking: Reported on 05/12/2016 05/15/12   Modena Jansky, MD  nystatin-triamcinolone [facility]) cream Apply 1 application topically 2 (two) times daily. Patient not taking: Reported on 05/12/2016 04/11/16   Marletta Lor, MD  Triamcinolone Acetonide (TRIAMCINOLONE 0.1 % CREAM : EUCERIN) CREA Apply 1 application topically 2 (two) times daily. Patient not taking: Reported  on 05/12/2016 04/11/16   Marletta Lor, MD    Family History Family History  Problem Relation Age of Onset  . Arthritis Mother   . Hypertension Mother   . Dementia Mother   . Cancer Father     colon     Social History Social History  Substance Use Topics  . Smoking status: Never Smoker  . Smokeless tobacco: Never Used  . Alcohol use No     Comment: 05/12/2012 "have a drink hardly ever  anymore"     Allergies   Lactose intolerance (gi) and Sulfa antibiotics   Review of Systems Review of Systems  Constitutional: Negative for appetite change, chills, diaphoresis, fatigue and fever.  HENT: Positive for drooling and trouble swallowing. Negative for mouth sores and sore throat.   Eyes: Negative for visual disturbance.  Respiratory: Negative for cough, chest tightness, shortness of breath and wheezing.   Cardiovascular: Negative for chest pain.  Gastrointestinal: Negative for abdominal distention, abdominal pain, diarrhea, nausea and vomiting.  Endocrine: Negative for polydipsia, polyphagia and polyuria.  Genitourinary: Negative for dysuria, frequency and hematuria.  Musculoskeletal: Negative for gait problem.  Skin: Negative for color change, pallor and rash.  Neurological: Negative for dizziness, syncope, light-headedness and headaches.  Hematological: Does not bruise/bleed easily.  Psychiatric/Behavioral: Negative for behavioral problems and confusion.     Physical Exam Updated Vital Signs BP 136/73 (BP Location: Right Arm)   Pulse 98   Temp 98.3 F (36.8 C) (Oral)   Resp 18   Ht 5' (1.524 m)   Wt 118 lb (53.5 kg)   SpO2 100%   BMI 23.05 kg/m   Physical Exam  Constitutional: She is oriented to person, place, and time. She appears well-developed and well-nourished. No distress.  No distress. Normal voice. Spitting her secretions without frank drooling. Posterior pharynx appears normal. No adenopathy in the neck. No masses or asymmetry.  HENT:  Head: Normocephalic.  Eyes: Conjunctivae are normal. Pupils are equal, round, and reactive to light. No scleral icterus.  Neck: Normal range of motion. Neck supple. No thyromegaly present.  Cardiovascular: Normal rate and regular rhythm.  Exam reveals no gallop and no friction rub.   No murmur heard. Pulmonary/Chest: Effort normal and breath sounds normal. No respiratory distress. She has no wheezes. She has no  rales.  Abdominal: Soft. Bowel sounds are normal. She exhibits no distension. There is no tenderness. There is no rebound.  Musculoskeletal: Normal range of motion.  Neurological: She is alert and oriented to person, place, and time.  Symmetric neurological exam. No cranial nerve deficits. No pronator drift. No leg drift. Normal sensation. Normal cerebellar.  Skin: Skin is warm and dry. No rash noted.  Psychiatric: She has a normal mood and affect. Her behavior is normal.     ED Treatments / Results  Labs (all labs ordered are listed, but only abnormal results are displayed) Labs Reviewed - No data to display  EKG  EKG Interpretation None       Radiology No results found.  Procedures Procedures (including critical care time)  Medications Ordered in ED Medications  LORazepam (ATIVAN) injection 0.25 mg (not administered)  sodium chloride 0.9 % bolus 500 mL (0 mLs Intravenous Stopped 05/12/16 1106)     Initial Impression / Assessment and Plan / ED Course  I have reviewed the triage vital signs and the nursing notes.  Pertinent labs & imaging results that were available during my care of the patient were reviewed by me and considered  in my medical decision making (see chart for details).     IV was placed was given 500 mL's of fluid as she states that she has not taken anything and since this episode started yesterday. Normally they last less than 30 minutes. Over the course of the next 2 hours her symptoms improved and she becomes less anxious. Was given 25 of Ativan and has improvement of her symptoms and lessening of her feeling of anxiety. I think she is appropriate for discharge home. Primary care follow-up. Recheck ER as needed.  Final Clinical Impressions(s) / ED Diagnoses   Final diagnoses:  Dysphagia, unspecified type    New Prescriptions New Prescriptions   No medications on file     Tanna Furry, MD 05/12/16 1233

## 2016-05-12 NOTE — Discharge Instructions (Signed)
Slowly increase your diet. Recheck with your PCP, and with Dr. Lucia Gaskins.

## 2016-05-12 NOTE — ED Triage Notes (Signed)
Pt reports she has a condition where the back of her throat swells up when something touches the back of her throat.  Normally this lasts for 15 minutes, but this time it has lasted since yesterday. Pt reports she has been unable to get down fluids or medications since then.  Pt speaking in full sentences.

## 2016-05-16 DIAGNOSIS — R490 Dysphonia: Secondary | ICD-10-CM | POA: Diagnosis not present

## 2016-07-07 ENCOUNTER — Ambulatory Visit: Payer: Medicare Other | Attending: Otolaryngology

## 2016-07-07 DIAGNOSIS — R1310 Dysphagia, unspecified: Secondary | ICD-10-CM

## 2016-07-07 NOTE — Patient Instructions (Signed)
  I am recommending a modified barium swallow exam to assess what exactly is going on with your swallowing.

## 2016-07-07 NOTE — Therapy (Signed)
[facility] [address] [place], [place], [address] Phone: [phone]   Fax:  [phone]  Speech Language Pathology Evaluation  Patient Details  Name: Savannah Diaz MRN: [id number] Date of Birth: October 27, 1932 Referring Provider: Melony Overly, MD  Encounter Date: 07/07/2016      End of Session - 07/07/16 1649    Visit Number 1   Number of Visits 17   Date for SLP Re-Evaluation 09/12/16   SLP Start Time 1406  pt 5 minutes late   SLP Stop Time  11   SLP Time Calculation (min) 42 min   Activity Tolerance Patient tolerated treatment well      Past Medical History:  Diagnosis Date  . Anxiety   . Arthritis    "qwhere; but it doesn't hurt" (05/12/2012)  . Cataract    left  . Depression   . Dysphagia    "have had it for 7 yr; got worse 4 days ago" (05/12/2012)  . GERD (gastroesophageal reflux disease)   . Hypertension   . Migraines    "menopause cured them" (05/12/2012)  . Osteoporosis     Past Surgical History:  Procedure Laterality Date  . CATARACT EXTRACTION W/ INTRAOCULAR LENS IMPLANT Left ? 2010  . ESOPHAGOGASTRODUODENOSCOPY N/A 05/10/2012   Procedure: ESOPHAGOGASTRODUODENOSCOPY (EGD);  Surgeon: Jerene Bears, MD;  Location: [facility];  Service: Gastroenterology;  Laterality: N/A;  . TONSILLECTOMY  1940's    There were no vitals filed for this visit.          SLP Evaluation [facility] - 07/07/16 1335      SLP Visit Information   SLP Received On 07/07/16   Referring Provider Melony Overly, MD   Onset Date <2014   Medical Diagnosis Pharyngoesophagieal Dysphagia     General Information   HPI Pt reports dysphagia in the form of coughing during meals when she feels a spasm in the rt posterior oral cavity/upper pharyngeal cavity, occurring a couple/few times per week. This occurs with mucous. Pt states Ensure "goes down smooth." Pt can anticipate difficulty, and it is transient.     Behavioral/Cognition Pt appears verbally disorganized, with possible memory disorder.     Prior Functional Status   Cognitive/Linguistic Baseline Within functional limits     Cognition   Overall Cognitive Status Within Functional Limits for tasks assessed     Auditory Comprehension   Overall Auditory Comprehension Appears within functional limits for tasks assessed     Verbal Expression   Overall Verbal Expression Appears within functional limits for tasks assessed     Oral Motor/Sensory Function   Overall Oral Motor/Sensory Function Appears within functional limits for tasks assessed     Motor Speech   Overall Motor Speech Appears within functional limits for tasks assessed   Motor Planning --  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 Pt and daughter agree pt has dysphagia c/b coughing approx twice-thrice/week. Pt has not been completing swallowing HEP provided by SLP in [place]. SLP encouraged pt to cont with this until recommended modified barium swallow (MBSS) is performed. Pt may well return to this center if SLP completing MBSS with pt ID's muscle weakness during the test.                     SLP Education - 07/07/16 1648    Education provided Yes   Education Details  recommendations from ST eval (modified barium swallow), ST course from this point onward, cont with her HEP from 4 years ago   Person(s) Educated Patient;Child(ren)   Methods Explanation   Comprehension Verbalized understanding;Need further instruction          SLP Short Term Goals - 2016-08-01 1654      SLP SHORT TERM GOAL #1   Title pt will complete HEP with rare min A over three sessions   Time 4   Period Weeks   Status New     SLP SHORT TERM GOAL #2   Title pt will demo swallow precautions with POs with rare min A during three therapy sessions   Time 4   Period Weeks   Status New           SLP Long Term Goals - 08/01/16 1654      SLP LONG TERM GOAL #1   Title pt will complete dysphagia HEP with modified independence over two sesisons   Time 8   Period Weeks   Status New     SLP LONG TERM GOAL #2   Title pt will adhere to swallow precautions (PRN) with POs during three therapy sessions with modified independence   Time 8   Period Weeks   Status New          Plan - 08/01/2016 1650    Clinical Impression Statement Pt presents with seemingly WNL swallow function today however maintains ( as well as dtr) that she has dysphagia x2/week exhibited by coughing. No overt s/s aspiration PNA today, nor aspiration with POs. However, since pt had abnormal modified barium swallow in 2014 without notable follow up due to pt summering in [place] a follow up modified barium swallow is recommneded. Swallow therapy may continue after that assessment at this facility so STGs and LTGs will be derived today.   Speech Therapy Frequency 2x / week   Duration --  8 weeks (to begin PRN following modified)   Treatment/Interventions Aspiration precaution training;Pharyngeal strengthening exercises;Diet toleration management by SLP;Internal/external aids;SLP instruction and feedback;Patient/family education;Cueing hierarchy;Trials of upgraded texture/liquids;Compensatory strategies   Potential to Achieve Goals Good   Consulted and Agree with Plan of Care Patient;Family member/caregiver   Family Member Consulted daughter      Patient will benefit from skilled therapeutic intervention in order to improve the following deficits and impairments:   Dysphagia, unspecified type      G-Codes - 08/01/2016 1656    Functional Assessment Tool Used noms   Functional Limitations Swallowing   Swallow Current Status [phone]) At least 1 percent but less than 20 percent impaired, limited or restricted  (reported)   Swallow Goal Status ([id number]) 0 percent impaired, limited or restricted       Problem List Patient Active Problem List   Diagnosis Date Noted  . UTI (urinary tract infection) 03/22/2016  . Influenza A 03/22/2016  . Fall 03/21/2016  . 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 Aspiration pneumonia ([place]) 03/21/2016  . Transient alteration of awareness 05/10/2015  . Dehydration with hypernatremia 05/13/2012  . Hypokalemia 05/12/2012  . Dysphagia 05/10/2012  . Hypertension 04/28/2011  . Osteoarthritis 04/28/2011  . Agoraphobia 04/28/2011    [facility] [place] 08-01-16, 4:56 PM  [facility] [address] [place], [place], [address] Phone: [phone]   Fax:  [phone]  Name: Savannah Diaz MRN: [id number] Date of Birth: [birth year]

## 2016-07-08 ENCOUNTER — Other Ambulatory Visit (HOSPITAL_COMMUNITY): Payer: Self-pay | Admitting: Otolaryngology

## 2016-07-08 DIAGNOSIS — R1319 Other dysphagia: Secondary | ICD-10-CM

## 2016-07-11 ENCOUNTER — Ambulatory Visit (HOSPITAL_COMMUNITY)
Admission: RE | Admit: 2016-07-11 | Discharge: 2016-07-11 | Disposition: A | Discharge status: Home / Self Care | Payer: Medicare Other | Source: Ambulatory Visit | Attending: Otolaryngology | Admitting: Otolaryngology

## 2016-07-11 DIAGNOSIS — R1319 Other dysphagia: Secondary | ICD-10-CM

## 2016-07-11 DIAGNOSIS — R1310 Dysphagia, unspecified: Secondary | ICD-10-CM | POA: Diagnosis not present

## 2016-07-11 DIAGNOSIS — R05 Cough: Secondary | ICD-10-CM | POA: Diagnosis not present

## 2016-07-18 ENCOUNTER — Ambulatory Visit: Payer: Medicare Other | Admitting: *Deleted

## 2016-07-18 ENCOUNTER — Encounter: Payer: Self-pay | Admitting: *Deleted

## 2016-07-18 ENCOUNTER — Encounter: Payer: Self-pay | Admitting: Internal Medicine

## 2016-07-18 DIAGNOSIS — R1310 Dysphagia, unspecified: Secondary | ICD-10-CM | POA: Diagnosis not present

## 2016-07-18 NOTE — Therapy (Signed)
[facility] [address] [place], [place], [address] Phone: [phone]   Fax:  [phone]  Speech Language Pathology Treatment  Patient Details  Name: Savannah Diaz MRN: [id number] Date of Birth: 1933/01/30 Referring Provider: Melony Overly, MD  Encounter Date: 07/18/2016      End of Session - 07/18/16 1249    Visit Number 2   Number of Visits 2   SLP Start Time 0930   SLP Stop Time  1020   SLP Time Calculation (min) 50 min   Activity Tolerance Patient tolerated treatment well      Past Medical History:  Diagnosis Date  . Anxiety   . Arthritis    "qwhere; but it doesn't hurt" (05/12/2012)  . Cataract    left  . Depression   . Dysphagia    "have had it for 7 yr; got worse 4 days ago" (05/12/2012)  . GERD (gastroesophageal reflux disease)   . Hypertension   . Migraines    "menopause cured them" (05/12/2012)  . Osteoporosis     Past Surgical History:  Procedure Laterality Date  . CATARACT EXTRACTION W/ INTRAOCULAR LENS IMPLANT Left ? 2010  . ESOPHAGOGASTRODUODENOSCOPY N/A 05/10/2012   Procedure: ESOPHAGOGASTRODUODENOSCOPY (EGD);  Surgeon: Jerene Bears, MD;  Location: [facility];  Service: Gastroenterology;  Laterality: N/A;  . TONSILLECTOMY  1940's    There were no vitals filed for this visit.      Subjective Assessment - 07/18/16 0944    Subjective Do I need to come back here?   Patient is accompained by: Family member  daughter Nira Conn   Currently in Pain? No/denies               ADULT SLP TREATMENT - 07/18/16 0001      General Information   Behavior/Cognition Alert;Cooperative;Pleasant mood     Treatment Provided   Treatment provided Dysphagia     Dysphagia Treatment   Temperature Spikes Noted No   Respiratory Status Room air   Oral Cavity - Dentition Adequate natural dentition   Treatment Methods Therapeutic exercise;Compensation strategy training;Patient/caregiver  education   Other treatment/comments Results and recommendations from [facility] were reviewed with pt/daughter. Pt verbalizes and reports following safe swallow precautions. SLP and pt practiced pharyngeal strengthening exercises, and SLP reviewed strategies for managing esophageal dysmotility. Recommended to pt that she take her reflux medication each day at the same time. Esophageal work up may be beneficial to determine nature and extent of esophageal issues, and possibly identify cause for laryngospasm.      Pain Assessment   Pain Assessment No/denies pain     Assessment / Recommendations / Plan   Plan DC ST - All goals met, education complete.     Dysphagia Recommendations   Diet recommendations Regular;Thin liquid   Liquids provided via Cup;Straw   Medication Administration Crushed with puree   Supervision Patient able to self feed   Compensations Slow rate;Small sips/bites;Multiple dry swallows after each bite/sip;Follow solids with liquid   Postural Changes and/or Swallow Maneuvers Seated upright 90 degrees;Upright 30-60 min after meal     Progression Toward Goals   Progression toward goals Goals met, education completed, patient discharged from [place] Education - 07/18/16 1249    Education provided Yes   Education Details pharyngeal strengthening exercises, strategies for managing esophageal dysmotility.   Person(s) Educated Patient;Child (heather)   Methods Explanation;Demonstration;Verbal cues;Handout   Comprehension Verbalized understanding;Returned demonstration  SPEECH THERAPY DISCHARGE SUMMARY  Visits from Start of Care: 2  Current functional level related to goals / functional outcomes: Pt recalls safe swallow precautions and reports following them during meals. Pt able to demonstrate exercises correctly.    Remaining deficits: Intermittent laryngospasms   Education / Equipment: Written strategies and exercises were reviewed with pt and provided.    Plan: Patient agrees to discharge.  Patient goals were met. Patient is being discharged due to meeting the stated rehab goals.  ?????          SLP Short Term Goals - 2016-08-12 1252      SLP SHORT TERM GOAL #1   Title pt will complete HEP with rare min A   Status Achieved     SLP SHORT TERM GOAL #2   Title pt will demo swallow precautions with POs with rare min A   Status Achieved          SLP Long Term Goals - 2016-08-12 1253      SLP LONG TERM GOAL #1   Title pt will complete dysphagia HEP with modified independence   Status Achieved     SLP LONG TERM GOAL #2   Title pt will adhere to swallow precautions (PRN) with POs with modified independence   Status Achieved          Plan - 2016/08/12 1250    Clinical Impression Statement Pt returns following MBS (07/11/16), which indicated no penetration or aspiration. Regular diet and thin liquids with crushed meds recommended, with alternating solids and liquids, small bites and sips, upright during and 30 minutes after meals recommended as well. Pt was given written strategies for management of esophageal dysmotility, and exercises to maintain the integrity of swallow musculature. Pt was encouraged to take reflux meds at the same time each day to see if that helps her laryngospasms. Continued ST intervention is not recommended at this time, however, pt may benefit from referral for esophageal work up due to recurrent laryngospasms.    Consulted and Agree with Plan of Care Patient;Family member/caregiver   Family Member Consulted daughter      Patient will benefit from skilled therapeutic intervention in order to improve the following deficits and impairments:   Dysphagia, unspecified type      G-Codes - 12-Aug-2016 1253    Functional Assessment Tool Used noms   Functional Limitations Swallowing   Swallow Current Status [phone]) At least 1 percent but less than 20 percent impaired, limited or restricted   Swallow Goal Status  ([id number]) At least 1 percent but less than 20 percent impaired, limited or restricted   Swallow Discharge Status [phone]) At least 1 percent but less than 20 percent impaired, limited or restricted      Problem List Patient Active Problem List   Diagnosis Date Noted  . UTI (urinary tract infection) 03/22/2016  . Influenza A 03/22/2016  . Fall 03/21/2016  . 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 Aspiration pneumonia ([place]) 03/21/2016  . Transient alteration of awareness 05/10/2015  . Dehydration with hypernatremia 05/13/2012  . Hypokalemia 05/12/2012  . Dysphagia 05/10/2012  . Hypertension 04/28/2011  . Osteoarthritis 04/28/2011  . Agoraphobia 04/28/2011    Shonna Chock 08-12-2016, 12:53 PM  [facility] [address] [place], [place], [address] Phone: [phone]   Fax:  [phone]   Name: Savannah Diaz MRN: [id number] Date of Birth: [birth year]

## 2016-07-23 NOTE — Telephone Encounter (Signed)
Dr. K - Please advise. Thanks! 

## 2016-07-25 DIAGNOSIS — S32010A Wedge compression fracture of first lumbar vertebra, initial encounter for closed fracture: Secondary | ICD-10-CM

## 2016-07-25 HISTORY — DX: Wedge compression fracture of first lumbar vertebra, initial encounter for closed fracture: S32.010A

## 2016-07-31 ENCOUNTER — Emergency Department (HOSPITAL_COMMUNITY): Payer: Medicare Other

## 2016-07-31 ENCOUNTER — Emergency Department (HOSPITAL_COMMUNITY)
Admission: EM | Admit: 2016-07-31 | Discharge: 2016-07-31 | Disposition: A | Discharge status: Home / Self Care | Payer: Medicare Other | Attending: Emergency Medicine | Admitting: Emergency Medicine

## 2016-07-31 ENCOUNTER — Encounter (HOSPITAL_COMMUNITY): Payer: Self-pay | Admitting: Emergency Medicine

## 2016-07-31 DIAGNOSIS — Z79899 Other long term (current) drug therapy: Secondary | ICD-10-CM | POA: Diagnosis not present

## 2016-07-31 DIAGNOSIS — Y999 Unspecified external cause status: Secondary | ICD-10-CM | POA: Diagnosis not present

## 2016-07-31 DIAGNOSIS — Z882 Allergy status to sulfonamides status: Secondary | ICD-10-CM | POA: Diagnosis not present

## 2016-07-31 DIAGNOSIS — W010XXA Fall on same level from slipping, tripping and stumbling without subsequent striking against object, initial encounter: Secondary | ICD-10-CM | POA: Diagnosis not present

## 2016-07-31 DIAGNOSIS — Y92013 Bedroom of single-family (private) house as the place of occurrence of the external cause: Secondary | ICD-10-CM | POA: Diagnosis not present

## 2016-07-31 DIAGNOSIS — N183 Chronic kidney disease, stage 3 (moderate): Secondary | ICD-10-CM | POA: Diagnosis not present

## 2016-07-31 DIAGNOSIS — I129 Hypertensive chronic kidney disease with stage 1 through stage 4 chronic kidney disease, or unspecified chronic kidney disease: Secondary | ICD-10-CM | POA: Insufficient documentation

## 2016-07-31 DIAGNOSIS — Y939 Activity, unspecified: Secondary | ICD-10-CM | POA: Diagnosis not present

## 2016-07-31 DIAGNOSIS — S32010A Wedge compression fracture of first lumbar vertebra, initial encounter for closed fracture: Secondary | ICD-10-CM | POA: Insufficient documentation

## 2016-07-31 DIAGNOSIS — M545 Low back pain: Secondary | ICD-10-CM | POA: Diagnosis not present

## 2016-07-31 DIAGNOSIS — S3992XA Unspecified injury of lower back, initial encounter: Secondary | ICD-10-CM | POA: Diagnosis present

## 2016-07-31 MED ORDER — HYDROCODONE-ACETAMINOPHEN 5-325 MG PO TABS
1.0000 | ORAL_TABLET | Freq: Once | ORAL | Status: AC
  Administered 2016-07-31: 1 via ORAL
  Filled 2016-07-31: qty 1

## 2016-07-31 MED ORDER — ACETAMINOPHEN 325 MG PO TABS
650.0000 mg | ORAL_TABLET | Freq: Once | ORAL | Status: AC
  Administered 2016-07-31: 650 mg via ORAL
  Filled 2016-07-31: qty 2

## 2016-07-31 MED ORDER — HYDROCODONE-ACETAMINOPHEN 5-325 MG PO TABS: 1.0000 | ORAL_TABLET | ORAL | 0 refills | Status: DC | PRN

## 2016-07-31 NOTE — ED Triage Notes (Signed)
Pt reports fall today in bedroom , felt unsteady , denies dizziness nor weakness. Fell backward on buttocks. Co lower right back pain . Denies loc nor head injury. Pt ambulated to triage room with some distress.

## 2016-07-31 NOTE — ED Provider Notes (Signed)
[facility] DEPT Provider Note   CSN: [id number] Arrival date & time: 07/31/16  1136     History   Chief Complaint Chief Complaint  Patient presents with  . Fall  . Back Pain    HPI Savannah Diaz is a 83 y.o. female.  HPI Patient presents to the emergency department after a fall today in her bedroom. She fell backwards landing on her buttock and low back. She reports low back pain now without radiation. She denies weakness in arms or legs. No head injury. Denies neck pain or headache. No use of anticoagulants. Denies recent illness. Lives independently at home. No major health issues.   Past Medical History:  Diagnosis Date  . Anxiety   . Arthritis    "qwhere; but it doesn't hurt" (05/12/2012)  . Cataract    left  . Depression   . Dysphagia    "have had it for 7 yr; got worse 4 days ago" (05/12/2012)  . GERD (gastroesophageal reflux disease)   . Hypertension   . Migraines    "menopause cured them" (05/12/2012)  . Osteoporosis     Patient Active Problem List   Diagnosis Date Noted  . UTI (urinary tract infection) 03/22/2016  . Influenza A 03/22/2016  . Fall 03/21/2016  . 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 Aspiration pneumonia ([place]) 03/21/2016  . Transient alteration of awareness 05/10/2015  . Dehydration with hypernatremia 05/13/2012  . Hypokalemia 05/12/2012  . Dysphagia 05/10/2012  . Hypertension 04/28/2011  . Osteoarthritis 04/28/2011  . Agoraphobia 04/28/2011    Past Surgical History:  Procedure Laterality Date  . CATARACT EXTRACTION W/ INTRAOCULAR LENS IMPLANT Left ? 2010  . ESOPHAGOGASTRODUODENOSCOPY N/A 05/10/2012   Procedure: ESOPHAGOGASTRODUODENOSCOPY (EGD);  Surgeon: Jerene Bears, MD;  Location: [facility];  Service: Gastroenterology;  Laterality: N/A;  . TONSILLECTOMY  1940's    OB History    No data available       Home Medications    Prior to Admission medications   Medication Sig Start Date End Date Taking?  Authorizing Provider  amitriptyline (ELAVIL) 50 MG tablet Take 250 mg by mouth at bedtime. Takes 5 tabs nightly-current dosage 04/28/11  Yes Marletta Lor, MD  atenolol (TENORMIN) 50 MG tablet Take 1.5 tablets (75 mg total) by mouth daily. 04/28/11  Yes Marletta Lor, MD  HYDROcodone-acetaminophen (NORCO/VICODIN) 5-325 MG tablet Take 1 tablet by mouth every 4 (four) hours as needed for moderate pain. 07/31/16   Jola Schmidt, MD    Family History Family History  Problem Relation Age of Onset  . Arthritis Mother   . Hypertension Mother   . Dementia Mother   . Cancer Father        colon     Social History Social History  Substance Use Topics  . Smoking status: Never Smoker  . Smokeless tobacco: Never Used  . Alcohol use No     Comment: 05/12/2012 "have a drink hardly ever anymore"     Allergies   Lactose intolerance (gi) and Sulfa antibiotics   Review of Systems Review of Systems  All other systems reviewed and are negative.    Physical Exam Updated Vital Signs BP (!) 139/96 (BP Location: Right Arm)   Pulse 87   Temp 98.6 F (37 C) (Oral)   Resp 16   SpO2 96%   Physical Exam  Constitutional: She is oriented to person, place, and time. She appears well-developed and well-nourished. No distress.  HENT:  Head: Normocephalic  and atraumatic.  Eyes: EOM are normal.  Neck: Normal range of motion. Neck supple.  C-spine nontender  Cardiovascular: Normal rate, regular rhythm and normal heart sounds.   Pulmonary/Chest: Effort normal and breath sounds normal.  Abdominal: Soft. She exhibits no distension. There is no tenderness.  Musculoskeletal: Normal range of motion.  No thoracic tenderness. Mild lumbar tenderness without step off. Full range of motion bilateral hips, knees, ankles.  Neurological: She is alert and oriented to person, place, and time.  Skin: Skin is warm and dry.  Psychiatric: She has a normal mood and affect. Judgment normal.  Nursing note and  vitals reviewed.    ED Treatments / Results  Labs (all labs ordered are listed, but only abnormal results are displayed) Labs Reviewed - No data to display  EKG  EKG Interpretation None       Radiology Dg Lumbar Spine Complete  Result Date: 07/31/2016 CLINICAL DATA:  Low back pain on the right since a fall in the bathroom today. Initial encounter. EXAM: LUMBAR SPINE - COMPLETE 4+ VIEW COMPARISON:  None. FINDINGS: There is convex right scoliosis with the apex at L4. The patient has an L1 superior endplate compression fracture with vertebral body height loss of approximately 30%. The fracture is age indeterminate. No other fracture is identified. Loss of disc space height and facet arthropathy are worst at L5-S1. IMPRESSION: Age indeterminate L1 superior endplate compression fracture without bony retropulsion. Convex right scoliosis. Degenerative disease L5-S1. Electronically Signed   By: Inge Rise M.D.   On: 07/31/2016 14:23    Procedures Procedures (including critical care time)  Medications Ordered in ED Medications  HYDROcodone-acetaminophen (NORCO/VICODIN) 5-325 MG per tablet 1 tablet (1 tablet Oral Given 07/31/16 1334)  acetaminophen (TYLENOL) tablet 650 mg (650 mg Oral Given 07/31/16 1334)     Initial Impression / Assessment and Plan / ED Course  I have reviewed the triage vital signs and the nursing notes.  Pertinent labs & imaging results that were available during my care of the patient were reviewed by me and considered in my medical decision making (see chart for details).     Patient is overall well-appearing. Lumbar compression fracture. Pain control. Ambulatory.    Final Clinical Impressions(s) / ED Diagnoses   Final diagnoses:  Closed compression fracture of first lumbar vertebra, initial encounter (HCC)    New Prescriptions New Prescriptions   HYDROCODONE-ACETAMINOPHEN (NORCO/VICODIN) 5-325 MG TABLET    Take 1 tablet by mouth every 4 (four) hours as  needed for moderate pain.     Jola Schmidt, MD 07/31/16 [phone]

## 2016-08-01 ENCOUNTER — Encounter: Payer: Self-pay | Admitting: Internal Medicine

## 2016-08-02 ENCOUNTER — Emergency Department (HOSPITAL_COMMUNITY)
Admission: EM | Admit: 2016-08-02 | Discharge: 2016-08-02 | Disposition: A | Discharge status: Home / Self Care | Payer: Medicare Other | Attending: Emergency Medicine | Admitting: Emergency Medicine

## 2016-08-02 ENCOUNTER — Encounter (HOSPITAL_COMMUNITY): Payer: Self-pay

## 2016-08-02 DIAGNOSIS — Z79899 Other long term (current) drug therapy: Secondary | ICD-10-CM | POA: Diagnosis not present

## 2016-08-02 DIAGNOSIS — R1314 Dysphagia, pharyngoesophageal phase: Secondary | ICD-10-CM | POA: Diagnosis not present

## 2016-08-02 DIAGNOSIS — I129 Hypertensive chronic kidney disease with stage 1 through stage 4 chronic kidney disease, or unspecified chronic kidney disease: Secondary | ICD-10-CM | POA: Diagnosis not present

## 2016-08-02 DIAGNOSIS — N183 Chronic kidney disease, stage 3 (moderate): Secondary | ICD-10-CM | POA: Insufficient documentation

## 2016-08-02 DIAGNOSIS — M545 Low back pain, unspecified: Secondary | ICD-10-CM

## 2016-08-02 DIAGNOSIS — R1310 Dysphagia, unspecified: Secondary | ICD-10-CM | POA: Diagnosis present

## 2016-08-02 MED ORDER — OXYCODONE-ACETAMINOPHEN 5-325 MG PO TABS: ORAL_TABLET | ORAL | 0 refills | Status: DC

## 2016-08-02 MED ORDER — OXYCODONE-ACETAMINOPHEN 5-325 MG PO TABS
1.0000 | ORAL_TABLET | Freq: Once | ORAL | Status: AC
  Administered 2016-08-02: 1 via ORAL
  Filled 2016-08-02: qty 1

## 2016-08-02 NOTE — Discharge Instructions (Signed)
Return here as needed.  Follow up with her primary care doctor °

## 2016-08-02 NOTE — ED Triage Notes (Signed)
PT HERE C/O DIFFICULTY SWALLOWING SINCE 10AM TODAY. PER THE PT AND DAUGHTER, THIS IS A CHRONIC PROBLEM THAT FLARES-UP. DENIES DIFFICULTY BREATHING OR SOB.

## 2016-08-02 NOTE — ED Provider Notes (Signed)
[facility] DEPT Provider Note   CSN: [id number] Arrival date & time: 08/02/16  8341     History   Chief Complaint Chief Complaint  Patient presents with  . Dysphagia    HPI Savannah Diaz is a 83 y.o. female.  HPI Patient presents to the emergency department with difficulty swallowing and low back pain.  The patient was here Thursday with injuries from a fall.  She has compression fracture in the lumbar spine region.  She is episodes that are chronic.  There were several years where she will have trouble swallowing that is intermittent and lasts anywhere from 30 minutes to 2 days.  The patient started having this difficulty this morning while attempting to swallow something and she states that it continued up until about 30 minutes until they got into the room.  The daughter states that he would like to be discharged home at this time.  She has no complaints.  They would like medication for her low back injury before they leave. The patient denies chest pain, shortness of breath, headache,blurred vision, neck pain, fever, cough, weakness, numbness, dizziness, anorexia, edema, abdominal pain, nausea, vomiting, diarrhea, rash, dysuria, hematemesis, bloody stool, near syncope, or syncope. Past Medical History:  Diagnosis Date  . Anxiety   . Arthritis    "qwhere; but it doesn't hurt" (05/12/2012)  . Cataract    left  . Depression   . Dysphagia    "have had it for 7 yr; got worse 4 days ago" (05/12/2012)  . GERD (gastroesophageal reflux disease)   . Hypertension   . Migraines    "menopause cured them" (05/12/2012)  . Osteoporosis     Patient Active Problem List   Diagnosis Date Noted  . UTI (urinary tract infection) 03/22/2016  . Influenza A 03/22/2016  . Fall 03/21/2016  . 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 Aspiration pneumonia ([place]) 03/21/2016  . Transient alteration of awareness 05/10/2015  . Dehydration with hypernatremia 05/13/2012  . Hypokalemia  05/12/2012  . Dysphagia 05/10/2012  . Hypertension 04/28/2011  . Osteoarthritis 04/28/2011  . Agoraphobia 04/28/2011    Past Surgical History:  Procedure Laterality Date  . CATARACT EXTRACTION W/ INTRAOCULAR LENS IMPLANT Left ? 2010  . ESOPHAGOGASTRODUODENOSCOPY N/A 05/10/2012   Procedure: ESOPHAGOGASTRODUODENOSCOPY (EGD);  Surgeon: Jerene Bears, MD;  Location: [facility];  Service: Gastroenterology;  Laterality: N/A;  . TONSILLECTOMY  1940's    OB History    No data available       Home Medications    Prior to Admission medications   Medication Sig Start Date End Date Taking? Authorizing Provider  amitriptyline (ELAVIL) 50 MG tablet Take 250 mg by mouth at bedtime. Takes 5 tabs nightly-current dosage 04/28/11   Marletta Lor, MD  atenolol (TENORMIN) 50 MG tablet Take 1.5 tablets (75 mg total) by mouth daily. 04/28/11   Marletta Lor, MD  HYDROcodone-acetaminophen (NORCO/VICODIN) 5-325 MG tablet Take 1 tablet by mouth every 4 (four) hours as needed for moderate pain. 07/31/16   Jola Schmidt, MD    Family History Family History  Problem Relation Age of Onset  . Arthritis Mother   . Hypertension Mother   . Dementia Mother   . Cancer Father        colon     Social History Social History  Substance Use Topics  . Smoking status: Never Smoker  . Smokeless tobacco: Never Used  . Alcohol use No     Comment: 05/12/2012 "have a  drink hardly ever anymore"     Allergies   Lactose intolerance (gi) and Sulfa antibiotics   Review of Systems Review of Systems All other systems negative except as documented in the HPI. All pertinent positives and negatives as reviewed in the HPI.  Physical Exam Updated Vital Signs BP 140/72 (BP Location: Left Arm)   Pulse 86   Temp 98.3 F (36.8 C) (Oral)   Resp 18   Ht 5' (1.524 m)   Wt 53.5 kg (118 lb)   SpO2 97%   BMI 23.05 kg/m   Physical Exam  Constitutional: She is oriented to person, place, and time. She appears  well-developed and well-nourished. No distress.  HENT:  Head: Normocephalic and atraumatic.  Eyes: Pupils are equal, round, and reactive to light.  Pulmonary/Chest: Effort normal.  Neurological: She is alert and oriented to person, place, and time. She exhibits normal muscle tone. Coordination normal.  Skin: Skin is warm and dry.  Psychiatric: She has a normal mood and affect.  Nursing note and vitals reviewed.    ED Treatments / Results  Labs (all labs ordered are listed, but only abnormal results are displayed) Labs Reviewed - No data to display  EKG  EKG Interpretation None       Radiology No results found.  Procedures Procedures (including critical care time)  Medications Ordered in ED Medications  oxyCODONE-acetaminophen (PERCOCET/ROXICET) 5-325 MG per tablet 1 tablet (not administered)     Initial Impression / Assessment and Plan / ED Course  I have reviewed the triage vital signs and the nursing notes.  Pertinent labs & imaging results that were available during my care of the patient were reviewed by me and considered in my medical decision making (see chart for details).     The patient will be discharged home.  She is released with daughter and husband.  Return here as needed.  Told to follow up with her primary care doctor.  Patient and daughter agree to the plan and all questions were answered  Final Clinical Impressions(s) / ED Diagnoses   Final diagnoses:  None    New Prescriptions New Prescriptions   No medications on file     Dalia Heading, Hershal Coria 08/02/16 2231    [place]us, Jenny Reichmann, MD 08/02/16 2241

## 2016-08-04 ENCOUNTER — Telehealth: Payer: Self-pay | Admitting: Internal Medicine

## 2016-08-04 NOTE — Telephone Encounter (Signed)
Notify daughter that Valium has an extremely long half life and increasing to twice a day, may increase her risk of falls

## 2016-08-04 NOTE — Telephone Encounter (Signed)
Spoke with daughter that Valium has an extremely long half life and increasing to twice a day, may increase her risk of falls. Daughter verbalized concern about her mothers decline. [facility] F/U made for 08/08/16.

## 2016-08-04 NOTE — Telephone Encounter (Signed)
Pt had a fall  And was see at [facility] on Sunday and daughter would like to increase valium 5 mg to more than once a day.

## 2016-08-04 NOTE — Telephone Encounter (Signed)
Please see message below, please advise 

## 2016-08-07 ENCOUNTER — Telehealth: Payer: Self-pay | Admitting: Internal Medicine

## 2016-08-07 NOTE — Telephone Encounter (Signed)
Patient Name: Savannah Diaz  DOB: [birth year]    Initial Comment Caller states mother hasn't had bm in a week   Nurse Assessment  Nurse: Mallie Mussel, RN, Alveta Heimlich Date/Time Eilene Ghazi Time): 08/07/2016 12:36:23 PM  Confirm and document reason for call. If symptomatic, describe symptoms. ---Caller states that her mother has not had a BM in a week. She is on pain pills for a back injury. She is denying abdominal pain. She denies vomiting.  Does the patient have any new or worsening symptoms? ---Yes  Will a triage be completed? ---Yes  Related visit to physician within the last 2 weeks? ---No  Does the PT have any chronic conditions? (i.e. diabetes, asthma, etc.) ---Yes  List chronic conditions. ---HTN, GERD, Anxiety,  Is this a behavioral health or substance abuse call? ---No     Guidelines    Guideline Title Affirmed Question Affirmed Notes  Constipation Last bowel movement (BM) > 4 days ago    Final Disposition User   See Physician within [place], RN, Alveta Heimlich    Comments  She does have an appointment for 2:00pm tomorrow. She wants to be sure it is okay to wait that long.   Referrals  REFERRED TO PCP OFFICE   Disagree/Comply: Comply

## 2016-08-07 NOTE — Telephone Encounter (Signed)
Please advise 

## 2016-08-07 NOTE — Telephone Encounter (Signed)
Okay for tomorrow's appointment Suggested patient to increase fluid intake Use MiraLAX one cup in 12 ounces of water 3 times daily Office followup tomorrow as scheduled

## 2016-08-08 ENCOUNTER — Ambulatory Visit (INDEPENDENT_AMBULATORY_CARE_PROVIDER_SITE_OTHER): Payer: Medicare Other | Admitting: Internal Medicine

## 2016-08-08 ENCOUNTER — Encounter: Payer: Self-pay | Admitting: Internal Medicine

## 2016-08-08 VITALS — BP 100/72 | HR 78 | Temp 98.5°F | Wt 118.8 lb

## 2016-08-08 DIAGNOSIS — I10 Essential (primary) hypertension: Secondary | ICD-10-CM

## 2016-08-08 DIAGNOSIS — S32010D Wedge compression fracture of first lumbar vertebra, subsequent encounter for fracture with routine healing: Secondary | ICD-10-CM | POA: Diagnosis not present

## 2016-08-08 DIAGNOSIS — R1310 Dysphagia, unspecified: Secondary | ICD-10-CM

## 2016-08-08 DIAGNOSIS — Z79899 Other long term (current) drug therapy: Secondary | ICD-10-CM | POA: Diagnosis not present

## 2016-08-08 DIAGNOSIS — R1319 Other dysphagia: Secondary | ICD-10-CM

## 2016-08-08 NOTE — Telephone Encounter (Signed)
Patient seen in office today; provider educated on instructions during visit

## 2016-08-08 NOTE — Progress Notes (Signed)
Subjective:    Patient ID: Savannah Diaz, female    DOB: Mar 12, 1932, 83 y.o.   MRN: [id number]  HPI  83 year old patient who is seen today following a recent ED visit.  She was seen on 6 7 after a fall with back pain.  X-rays revealed a L1 lumbar compression fracture.  She was seen 2 days later with persistent pain and also some swallowing difficulties.  Swallowing difficulties have been quite chronic and she has been evaluated with a barium swallow as well as by speech pathology. She is on some analgesics and has improved but having some significant constipation issues.  She is using MiraLAX only once daily. She has a history of essential hypertension  Hospital records reviewed.  She has had four ED visits this year as well as a hospital admission for pneumonia  Past Medical History:  Diagnosis Date  . Anxiety   . Arthritis    "qwhere; but it doesn't hurt" (05/12/2012)  . Cataract    left  . Depression   . Dysphagia    "have had it for 7 yr; got worse 4 days ago" (05/12/2012)  . GERD (gastroesophageal reflux disease)   . Hypertension   . Migraines    "menopause cured them" (05/12/2012)  . Osteoporosis      Social History   Social History  . Marital status: Widowed    Spouse name: N/A  . Number of children: 2  . Years of education: N/A   Occupational History  . Retired    Social History Main Topics  . Smoking status: Never Smoker  . Smokeless tobacco: Never Used  . Alcohol use No     Comment: 05/12/2012 "have a drink hardly ever anymore"  . Drug use: No  . Sexual activity: No   Other Topics Concern  . Not on file   Social History Narrative  . No narrative on file    Past Surgical History:  Procedure Laterality Date  . CATARACT EXTRACTION W/ INTRAOCULAR LENS IMPLANT Left ? 2010  . ESOPHAGOGASTRODUODENOSCOPY N/A 05/10/2012   Procedure: ESOPHAGOGASTRODUODENOSCOPY (EGD);  Surgeon: Jerene Bears, MD;  Location: [facility];  Service: Gastroenterology;  Laterality:  N/A;  . TONSILLECTOMY  1940's    Family History  Problem Relation Age of Onset  . Arthritis Mother   . Hypertension Mother   . Dementia Mother   . Cancer Father        colon     Allergies  Allergen Reactions  . Lactose Intolerance (Gi) Other (See Comments)    Gi tract issues  . Sulfa Antibiotics Hives    Childhood allergy     Current Outpatient Prescriptions on File Prior to Visit  Medication Sig Dispense Refill  . amitriptyline (ELAVIL) 50 MG tablet Take 250 mg by mouth at bedtime. Takes 5 tabs nightly-current dosage    . atenolol (TENORMIN) 50 MG tablet Take 1.5 tablets (75 mg total) by mouth daily. 180 tablet 4  . HYDROcodone-acetaminophen (NORCO/VICODIN) 5-325 MG tablet Take 1 tablet by mouth every 4 (four) hours as needed for moderate pain. 15 tablet 0  . oxyCODONE-acetaminophen (PERCOCET/ROXICET) 5-325 MG tablet 1 PO q4-6 PRN pain 20 tablet 0   No current facility-administered medications on file prior to visit.     BP 100/72 (BP Location: Left Arm, Patient Position: Sitting, Cuff Size: Normal)   Pulse 78   Temp 98.5 F (36.9 C) (Oral)   Wt 118 lb 12.8 oz (53.9 kg)   SpO2 96%  BMI 23.20 kg/m     Review of Systems  Constitutional: Negative.   HENT: Negative for congestion, dental problem, hearing loss, rhinorrhea, sinus pressure, sore throat and tinnitus.   Eyes: Negative for pain, discharge and visual disturbance.  Respiratory: Negative for cough and shortness of breath.   Cardiovascular: Negative for chest pain, palpitations and leg swelling.  Gastrointestinal: Negative for abdominal distention, abdominal pain, blood in stool, constipation, diarrhea, nausea and vomiting.       Chronic swallowing difficulties  Genitourinary: Negative for difficulty urinating, dysuria, flank pain, frequency, hematuria, pelvic pain, urgency, vaginal bleeding, vaginal discharge and vaginal pain.  Musculoskeletal: Positive for back pain and gait problem. Negative for arthralgias  and joint swelling.  Skin: Negative for rash.  Neurological: Negative for dizziness, syncope, speech difficulty, weakness, numbness and headaches.  Hematological: Negative for adenopathy.  Psychiatric/Behavioral: Negative for agitation, behavioral problems and dysphoric mood. The patient is not nervous/anxious.        Objective:   Physical Exam  Constitutional: She is oriented to person, place, and time. She appears well-developed and well-nourished.  Able to transfer from a sitting position to the examining table with minimal difficulty Back brace in place Blood pressure well controlled  Using a walker  HENT:  Head: Normocephalic.  Right Ear: External ear normal.  Left Ear: External ear normal.  Mouth/Throat: Oropharynx is clear and moist.  Eyes: Conjunctivae and EOM are normal. Pupils are equal, round, and reactive to light.  Neck: Normal range of motion. Neck supple. No thyromegaly present.  Cardiovascular: Normal rate, regular rhythm, normal heart sounds and intact distal pulses.   Pulmonary/Chest: Effort normal and breath sounds normal.  Abdominal: Soft. Bowel sounds are normal. She exhibits no mass. There is no tenderness.  Musculoskeletal: Normal range of motion.  Lymphadenopathy:    She has no cervical adenopathy.  Neurological: She is alert and oriented to person, place, and time.  Skin: Skin is warm and dry. No rash noted.  Psychiatric: She has a normal mood and affect. Her behavior is normal.          Assessment & Plan:   Status post L1 compression fracture.  Continue symptomatic therapy.  Patient was advised to moderate her analgesics and to liberalize her use of MiraLAX Essential hypertension, stable Osteoarthritis Swallowing difficulties High risk medications.  Counseled to minimize and discontinue as soon as possible analgesics.  Trial of dose reduction of TCA .  Also discussed and encouraged  Return in 6 months for follow-up  Nyoka Cowden

## 2016-08-08 NOTE — Patient Instructions (Signed)
Limit your sodium (Salt) intake  Drink as much fluid as you  can tolerate over the next few days  Use MiraLAX as often as 3 times daily for control of constipation  Increase activity as pain permits  Return in 6 months for follow-up or as needed

## 2016-08-14 DIAGNOSIS — S32000A Wedge compression fracture of unspecified lumbar vertebra, initial encounter for closed fracture: Secondary | ICD-10-CM | POA: Diagnosis not present

## 2016-08-14 DIAGNOSIS — G4700 Insomnia, unspecified: Secondary | ICD-10-CM | POA: Diagnosis not present

## 2016-08-14 DIAGNOSIS — R1310 Dysphagia, unspecified: Secondary | ICD-10-CM | POA: Diagnosis not present

## 2016-08-14 DIAGNOSIS — F411 Generalized anxiety disorder: Secondary | ICD-10-CM | POA: Diagnosis not present

## 2016-09-10 DIAGNOSIS — Z8781 Personal history of (healed) traumatic fracture: Secondary | ICD-10-CM | POA: Diagnosis not present

## 2016-09-10 DIAGNOSIS — Z780 Asymptomatic menopausal state: Secondary | ICD-10-CM | POA: Diagnosis not present

## 2016-09-10 DIAGNOSIS — M810 Age-related osteoporosis without current pathological fracture: Secondary | ICD-10-CM | POA: Diagnosis not present

## 2016-09-12 DIAGNOSIS — R2681 Unsteadiness on feet: Secondary | ICD-10-CM | POA: Diagnosis not present

## 2016-09-12 DIAGNOSIS — M545 Low back pain: Secondary | ICD-10-CM | POA: Diagnosis not present

## 2016-09-12 DIAGNOSIS — S32000A Wedge compression fracture of unspecified lumbar vertebra, initial encounter for closed fracture: Secondary | ICD-10-CM | POA: Diagnosis not present

## 2016-09-12 DIAGNOSIS — I10 Essential (primary) hypertension: Secondary | ICD-10-CM | POA: Diagnosis not present

## 2016-09-12 DIAGNOSIS — R634 Abnormal weight loss: Secondary | ICD-10-CM | POA: Diagnosis not present

## 2016-09-12 DIAGNOSIS — R293 Abnormal posture: Secondary | ICD-10-CM | POA: Diagnosis not present

## 2016-09-12 DIAGNOSIS — F411 Generalized anxiety disorder: Secondary | ICD-10-CM | POA: Diagnosis not present

## 2016-09-17 ENCOUNTER — Ambulatory Visit: Payer: Medicare Other | Admitting: Physical Therapy

## 2016-09-17 DIAGNOSIS — H2513 Age-related nuclear cataract, bilateral: Secondary | ICD-10-CM | POA: Diagnosis not present

## 2016-09-18 DIAGNOSIS — M545 Low back pain: Secondary | ICD-10-CM | POA: Diagnosis not present

## 2016-09-18 DIAGNOSIS — R293 Abnormal posture: Secondary | ICD-10-CM | POA: Diagnosis not present

## 2016-09-18 DIAGNOSIS — S32000A Wedge compression fracture of unspecified lumbar vertebra, initial encounter for closed fracture: Secondary | ICD-10-CM | POA: Diagnosis not present

## 2016-09-18 DIAGNOSIS — R2681 Unsteadiness on feet: Secondary | ICD-10-CM | POA: Diagnosis not present

## 2016-09-25 DIAGNOSIS — R2681 Unsteadiness on feet: Secondary | ICD-10-CM | POA: Diagnosis not present

## 2016-09-25 DIAGNOSIS — R293 Abnormal posture: Secondary | ICD-10-CM | POA: Diagnosis not present

## 2016-09-25 DIAGNOSIS — M545 Low back pain: Secondary | ICD-10-CM | POA: Diagnosis not present

## 2016-09-25 DIAGNOSIS — S32000A Wedge compression fracture of unspecified lumbar vertebra, initial encounter for closed fracture: Secondary | ICD-10-CM | POA: Diagnosis not present

## 2016-09-30 DIAGNOSIS — M545 Low back pain: Secondary | ICD-10-CM | POA: Diagnosis not present

## 2016-09-30 DIAGNOSIS — R293 Abnormal posture: Secondary | ICD-10-CM | POA: Diagnosis not present

## 2016-09-30 DIAGNOSIS — S32000A Wedge compression fracture of unspecified lumbar vertebra, initial encounter for closed fracture: Secondary | ICD-10-CM | POA: Diagnosis not present

## 2016-09-30 DIAGNOSIS — R2681 Unsteadiness on feet: Secondary | ICD-10-CM | POA: Diagnosis not present

## 2016-10-02 DIAGNOSIS — M545 Low back pain: Secondary | ICD-10-CM | POA: Diagnosis not present

## 2016-10-02 DIAGNOSIS — S32000A Wedge compression fracture of unspecified lumbar vertebra, initial encounter for closed fracture: Secondary | ICD-10-CM | POA: Diagnosis not present

## 2016-10-02 DIAGNOSIS — R293 Abnormal posture: Secondary | ICD-10-CM | POA: Diagnosis not present

## 2016-10-02 DIAGNOSIS — R2681 Unsteadiness on feet: Secondary | ICD-10-CM | POA: Diagnosis not present

## 2016-10-07 DIAGNOSIS — S32000A Wedge compression fracture of unspecified lumbar vertebra, initial encounter for closed fracture: Secondary | ICD-10-CM | POA: Diagnosis not present

## 2016-10-07 DIAGNOSIS — M545 Low back pain: Secondary | ICD-10-CM | POA: Diagnosis not present

## 2016-10-07 DIAGNOSIS — R2681 Unsteadiness on feet: Secondary | ICD-10-CM | POA: Diagnosis not present

## 2016-10-07 DIAGNOSIS — R293 Abnormal posture: Secondary | ICD-10-CM | POA: Diagnosis not present

## 2016-10-09 DIAGNOSIS — M545 Low back pain: Secondary | ICD-10-CM | POA: Diagnosis not present

## 2016-10-09 DIAGNOSIS — S32000A Wedge compression fracture of unspecified lumbar vertebra, initial encounter for closed fracture: Secondary | ICD-10-CM | POA: Diagnosis not present

## 2016-10-09 DIAGNOSIS — R293 Abnormal posture: Secondary | ICD-10-CM | POA: Diagnosis not present

## 2016-10-09 DIAGNOSIS — R2681 Unsteadiness on feet: Secondary | ICD-10-CM | POA: Diagnosis not present

## 2016-10-10 DIAGNOSIS — I10 Essential (primary) hypertension: Secondary | ICD-10-CM | POA: Diagnosis not present

## 2016-10-10 DIAGNOSIS — Z79899 Other long term (current) drug therapy: Secondary | ICD-10-CM | POA: Diagnosis not present

## 2016-10-10 DIAGNOSIS — F411 Generalized anxiety disorder: Secondary | ICD-10-CM | POA: Diagnosis not present

## 2016-10-10 DIAGNOSIS — R1310 Dysphagia, unspecified: Secondary | ICD-10-CM | POA: Diagnosis not present

## 2016-10-10 DIAGNOSIS — M810 Age-related osteoporosis without current pathological fracture: Secondary | ICD-10-CM | POA: Diagnosis not present

## 2016-10-10 DIAGNOSIS — R194 Change in bowel habit: Secondary | ICD-10-CM | POA: Diagnosis not present

## 2016-10-14 DIAGNOSIS — R2681 Unsteadiness on feet: Secondary | ICD-10-CM | POA: Diagnosis not present

## 2016-10-14 DIAGNOSIS — M545 Low back pain: Secondary | ICD-10-CM | POA: Diagnosis not present

## 2016-10-14 DIAGNOSIS — S32000A Wedge compression fracture of unspecified lumbar vertebra, initial encounter for closed fracture: Secondary | ICD-10-CM | POA: Diagnosis not present

## 2016-10-14 DIAGNOSIS — R293 Abnormal posture: Secondary | ICD-10-CM | POA: Diagnosis not present

## 2016-10-16 DIAGNOSIS — R2681 Unsteadiness on feet: Secondary | ICD-10-CM | POA: Diagnosis not present

## 2016-10-16 DIAGNOSIS — S32000A Wedge compression fracture of unspecified lumbar vertebra, initial encounter for closed fracture: Secondary | ICD-10-CM | POA: Diagnosis not present

## 2016-10-16 DIAGNOSIS — R293 Abnormal posture: Secondary | ICD-10-CM | POA: Diagnosis not present

## 2016-10-16 DIAGNOSIS — M545 Low back pain: Secondary | ICD-10-CM | POA: Diagnosis not present

## 2016-10-20 DIAGNOSIS — M545 Low back pain: Secondary | ICD-10-CM | POA: Diagnosis not present

## 2016-10-20 DIAGNOSIS — R293 Abnormal posture: Secondary | ICD-10-CM | POA: Diagnosis not present

## 2016-10-20 DIAGNOSIS — S32000A Wedge compression fracture of unspecified lumbar vertebra, initial encounter for closed fracture: Secondary | ICD-10-CM | POA: Diagnosis not present

## 2016-10-20 DIAGNOSIS — R2681 Unsteadiness on feet: Secondary | ICD-10-CM | POA: Diagnosis not present

## 2016-10-23 DIAGNOSIS — R293 Abnormal posture: Secondary | ICD-10-CM | POA: Diagnosis not present

## 2016-10-23 DIAGNOSIS — S32000A Wedge compression fracture of unspecified lumbar vertebra, initial encounter for closed fracture: Secondary | ICD-10-CM | POA: Diagnosis not present

## 2016-10-23 DIAGNOSIS — R2681 Unsteadiness on feet: Secondary | ICD-10-CM | POA: Diagnosis not present

## 2016-10-23 DIAGNOSIS — M545 Low back pain: Secondary | ICD-10-CM | POA: Diagnosis not present

## 2016-11-13 ENCOUNTER — Encounter: Payer: Self-pay | Admitting: Internal Medicine

## 2016-11-14 DIAGNOSIS — I129 Hypertensive chronic kidney disease with stage 1 through stage 4 chronic kidney disease, or unspecified chronic kidney disease: Secondary | ICD-10-CM | POA: Diagnosis not present

## 2016-11-14 DIAGNOSIS — Z23 Encounter for immunization: Secondary | ICD-10-CM | POA: Diagnosis not present

## 2016-11-14 DIAGNOSIS — N183 Chronic kidney disease, stage 3 (moderate): Secondary | ICD-10-CM | POA: Diagnosis not present

## 2016-11-14 DIAGNOSIS — M8000XD Age-related osteoporosis with current pathological fracture, unspecified site, subsequent encounter for fracture with routine healing: Secondary | ICD-10-CM | POA: Diagnosis not present

## 2016-11-14 DIAGNOSIS — F411 Generalized anxiety disorder: Secondary | ICD-10-CM | POA: Diagnosis not present

## 2016-12-11 DIAGNOSIS — R2681 Unsteadiness on feet: Secondary | ICD-10-CM | POA: Diagnosis not present

## 2016-12-11 DIAGNOSIS — R2689 Other abnormalities of gait and mobility: Secondary | ICD-10-CM | POA: Diagnosis not present

## 2016-12-11 DIAGNOSIS — M6281 Muscle weakness (generalized): Secondary | ICD-10-CM | POA: Diagnosis not present

## 2016-12-11 DIAGNOSIS — M545 Low back pain: Secondary | ICD-10-CM | POA: Diagnosis not present

## 2016-12-16 DIAGNOSIS — R2689 Other abnormalities of gait and mobility: Secondary | ICD-10-CM | POA: Diagnosis not present

## 2016-12-16 DIAGNOSIS — M545 Low back pain: Secondary | ICD-10-CM | POA: Diagnosis not present

## 2016-12-16 DIAGNOSIS — M6281 Muscle weakness (generalized): Secondary | ICD-10-CM | POA: Diagnosis not present

## 2016-12-16 DIAGNOSIS — R2681 Unsteadiness on feet: Secondary | ICD-10-CM | POA: Diagnosis not present

## 2016-12-23 DIAGNOSIS — R2689 Other abnormalities of gait and mobility: Secondary | ICD-10-CM | POA: Diagnosis not present

## 2016-12-23 DIAGNOSIS — R2681 Unsteadiness on feet: Secondary | ICD-10-CM | POA: Diagnosis not present

## 2016-12-23 DIAGNOSIS — M6281 Muscle weakness (generalized): Secondary | ICD-10-CM | POA: Diagnosis not present

## 2016-12-23 DIAGNOSIS — M545 Low back pain: Secondary | ICD-10-CM | POA: Diagnosis not present

## 2016-12-26 DIAGNOSIS — M545 Low back pain: Secondary | ICD-10-CM | POA: Diagnosis not present

## 2016-12-26 DIAGNOSIS — R2681 Unsteadiness on feet: Secondary | ICD-10-CM | POA: Diagnosis not present

## 2016-12-26 DIAGNOSIS — M6281 Muscle weakness (generalized): Secondary | ICD-10-CM | POA: Diagnosis not present

## 2016-12-26 DIAGNOSIS — R2689 Other abnormalities of gait and mobility: Secondary | ICD-10-CM | POA: Diagnosis not present

## 2016-12-31 DIAGNOSIS — H00011 Hordeolum externum right upper eyelid: Secondary | ICD-10-CM | POA: Diagnosis not present

## 2017-01-02 DIAGNOSIS — R300 Dysuria: Secondary | ICD-10-CM | POA: Diagnosis not present

## 2017-01-02 DIAGNOSIS — H00011 Hordeolum externum right upper eyelid: Secondary | ICD-10-CM | POA: Diagnosis not present

## 2017-01-02 DIAGNOSIS — R8290 Unspecified abnormal findings in urine: Secondary | ICD-10-CM | POA: Diagnosis not present

## 2017-01-19 DIAGNOSIS — N183 Chronic kidney disease, stage 3 (moderate): Secondary | ICD-10-CM | POA: Diagnosis not present

## 2017-01-19 DIAGNOSIS — M79605 Pain in left leg: Secondary | ICD-10-CM | POA: Diagnosis not present

## 2017-01-19 DIAGNOSIS — Z79899 Other long term (current) drug therapy: Secondary | ICD-10-CM | POA: Diagnosis not present

## 2017-01-19 DIAGNOSIS — M79604 Pain in right leg: Secondary | ICD-10-CM | POA: Diagnosis not present

## 2017-01-19 DIAGNOSIS — H00013 Hordeolum externum right eye, unspecified eyelid: Secondary | ICD-10-CM | POA: Diagnosis not present

## 2017-01-19 DIAGNOSIS — I129 Hypertensive chronic kidney disease with stage 1 through stage 4 chronic kidney disease, or unspecified chronic kidney disease: Secondary | ICD-10-CM | POA: Diagnosis not present

## 2017-01-19 DIAGNOSIS — R300 Dysuria: Secondary | ICD-10-CM | POA: Diagnosis not present

## 2017-02-26 DIAGNOSIS — M6281 Muscle weakness (generalized): Secondary | ICD-10-CM | POA: Diagnosis not present

## 2017-02-26 DIAGNOSIS — R2681 Unsteadiness on feet: Secondary | ICD-10-CM | POA: Diagnosis not present

## 2017-02-26 DIAGNOSIS — R2689 Other abnormalities of gait and mobility: Secondary | ICD-10-CM | POA: Diagnosis not present

## 2017-03-03 DIAGNOSIS — R2681 Unsteadiness on feet: Secondary | ICD-10-CM | POA: Diagnosis not present

## 2017-03-03 DIAGNOSIS — M6281 Muscle weakness (generalized): Secondary | ICD-10-CM | POA: Diagnosis not present

## 2017-03-03 DIAGNOSIS — R2689 Other abnormalities of gait and mobility: Secondary | ICD-10-CM | POA: Diagnosis not present

## 2017-03-10 DIAGNOSIS — R2689 Other abnormalities of gait and mobility: Secondary | ICD-10-CM | POA: Diagnosis not present

## 2017-03-10 DIAGNOSIS — M6281 Muscle weakness (generalized): Secondary | ICD-10-CM | POA: Diagnosis not present

## 2017-03-10 DIAGNOSIS — R2681 Unsteadiness on feet: Secondary | ICD-10-CM | POA: Diagnosis not present

## 2017-03-23 ENCOUNTER — Other Ambulatory Visit: Payer: Self-pay | Admitting: Geriatric Medicine

## 2017-03-23 DIAGNOSIS — N183 Chronic kidney disease, stage 3 (moderate): Secondary | ICD-10-CM | POA: Diagnosis not present

## 2017-03-23 DIAGNOSIS — F411 Generalized anxiety disorder: Secondary | ICD-10-CM | POA: Diagnosis not present

## 2017-03-23 DIAGNOSIS — M5441 Lumbago with sciatica, right side: Secondary | ICD-10-CM

## 2017-03-23 DIAGNOSIS — K640 First degree hemorrhoids: Secondary | ICD-10-CM | POA: Diagnosis not present

## 2017-03-23 DIAGNOSIS — I129 Hypertensive chronic kidney disease with stage 1 through stage 4 chronic kidney disease, or unspecified chronic kidney disease: Secondary | ICD-10-CM | POA: Diagnosis not present

## 2017-03-23 DIAGNOSIS — M810 Age-related osteoporosis without current pathological fracture: Secondary | ICD-10-CM | POA: Diagnosis not present

## 2017-03-23 DIAGNOSIS — F5101 Primary insomnia: Secondary | ICD-10-CM | POA: Diagnosis not present

## 2017-03-23 DIAGNOSIS — G8929 Other chronic pain: Secondary | ICD-10-CM | POA: Diagnosis not present

## 2017-03-27 ENCOUNTER — Ambulatory Visit
Admission: RE | Admit: 2017-03-27 | Discharge: 2017-03-27 | Disposition: A | Discharge status: Home / Self Care | Payer: Medicare Other | Source: Ambulatory Visit | Attending: Geriatric Medicine | Admitting: Geriatric Medicine

## 2017-03-27 DIAGNOSIS — M48061 Spinal stenosis, lumbar region without neurogenic claudication: Secondary | ICD-10-CM | POA: Diagnosis not present

## 2017-03-27 DIAGNOSIS — M5441 Lumbago with sciatica, right side: Secondary | ICD-10-CM

## 2017-04-15 DIAGNOSIS — F325 Major depressive disorder, single episode, in full remission: Secondary | ICD-10-CM | POA: Diagnosis not present

## 2017-04-15 DIAGNOSIS — M79604 Pain in right leg: Secondary | ICD-10-CM | POA: Diagnosis not present

## 2017-04-15 DIAGNOSIS — N183 Chronic kidney disease, stage 3 (moderate): Secondary | ICD-10-CM | POA: Diagnosis not present

## 2017-04-15 DIAGNOSIS — M8000XD Age-related osteoporosis with current pathological fracture, unspecified site, subsequent encounter for fracture with routine healing: Secondary | ICD-10-CM | POA: Diagnosis not present

## 2017-04-15 DIAGNOSIS — I129 Hypertensive chronic kidney disease with stage 1 through stage 4 chronic kidney disease, or unspecified chronic kidney disease: Secondary | ICD-10-CM | POA: Diagnosis not present

## 2017-04-15 DIAGNOSIS — K640 First degree hemorrhoids: Secondary | ICD-10-CM | POA: Diagnosis not present

## 2017-04-15 DIAGNOSIS — M79605 Pain in left leg: Secondary | ICD-10-CM | POA: Diagnosis not present

## 2017-04-22 DIAGNOSIS — R2689 Other abnormalities of gait and mobility: Secondary | ICD-10-CM | POA: Diagnosis not present

## 2017-04-22 DIAGNOSIS — M6281 Muscle weakness (generalized): Secondary | ICD-10-CM | POA: Diagnosis not present

## 2017-04-22 DIAGNOSIS — R2681 Unsteadiness on feet: Secondary | ICD-10-CM | POA: Diagnosis not present

## 2017-04-27 DIAGNOSIS — R2689 Other abnormalities of gait and mobility: Secondary | ICD-10-CM | POA: Diagnosis not present

## 2017-04-27 DIAGNOSIS — M6281 Muscle weakness (generalized): Secondary | ICD-10-CM | POA: Diagnosis not present

## 2017-04-27 DIAGNOSIS — R2681 Unsteadiness on feet: Secondary | ICD-10-CM | POA: Diagnosis not present

## 2017-04-30 DIAGNOSIS — R2681 Unsteadiness on feet: Secondary | ICD-10-CM | POA: Diagnosis not present

## 2017-04-30 DIAGNOSIS — R2689 Other abnormalities of gait and mobility: Secondary | ICD-10-CM | POA: Diagnosis not present

## 2017-04-30 DIAGNOSIS — M6281 Muscle weakness (generalized): Secondary | ICD-10-CM | POA: Diagnosis not present

## 2017-05-04 DIAGNOSIS — R2681 Unsteadiness on feet: Secondary | ICD-10-CM | POA: Diagnosis not present

## 2017-05-04 DIAGNOSIS — R2689 Other abnormalities of gait and mobility: Secondary | ICD-10-CM | POA: Diagnosis not present

## 2017-05-04 DIAGNOSIS — M6281 Muscle weakness (generalized): Secondary | ICD-10-CM | POA: Diagnosis not present

## 2017-05-07 DIAGNOSIS — R2681 Unsteadiness on feet: Secondary | ICD-10-CM | POA: Diagnosis not present

## 2017-05-07 DIAGNOSIS — R2689 Other abnormalities of gait and mobility: Secondary | ICD-10-CM | POA: Diagnosis not present

## 2017-05-07 DIAGNOSIS — M6281 Muscle weakness (generalized): Secondary | ICD-10-CM | POA: Diagnosis not present

## 2017-05-11 DIAGNOSIS — R2681 Unsteadiness on feet: Secondary | ICD-10-CM | POA: Diagnosis not present

## 2017-05-11 DIAGNOSIS — M6281 Muscle weakness (generalized): Secondary | ICD-10-CM | POA: Diagnosis not present

## 2017-05-11 DIAGNOSIS — R2689 Other abnormalities of gait and mobility: Secondary | ICD-10-CM | POA: Diagnosis not present

## 2017-05-14 DIAGNOSIS — R2681 Unsteadiness on feet: Secondary | ICD-10-CM | POA: Diagnosis not present

## 2017-05-14 DIAGNOSIS — R2689 Other abnormalities of gait and mobility: Secondary | ICD-10-CM | POA: Diagnosis not present

## 2017-05-14 DIAGNOSIS — M6281 Muscle weakness (generalized): Secondary | ICD-10-CM | POA: Diagnosis not present

## 2017-05-18 DIAGNOSIS — R2681 Unsteadiness on feet: Secondary | ICD-10-CM | POA: Diagnosis not present

## 2017-05-18 DIAGNOSIS — R2689 Other abnormalities of gait and mobility: Secondary | ICD-10-CM | POA: Diagnosis not present

## 2017-05-18 DIAGNOSIS — M6281 Muscle weakness (generalized): Secondary | ICD-10-CM | POA: Diagnosis not present

## 2017-06-04 DIAGNOSIS — R2689 Other abnormalities of gait and mobility: Secondary | ICD-10-CM | POA: Diagnosis not present

## 2017-06-04 DIAGNOSIS — M6281 Muscle weakness (generalized): Secondary | ICD-10-CM | POA: Diagnosis not present

## 2017-06-04 DIAGNOSIS — R2681 Unsteadiness on feet: Secondary | ICD-10-CM | POA: Diagnosis not present

## 2017-06-07 DIAGNOSIS — N390 Urinary tract infection, site not specified: Secondary | ICD-10-CM | POA: Diagnosis not present

## 2017-06-14 DIAGNOSIS — N390 Urinary tract infection, site not specified: Secondary | ICD-10-CM | POA: Diagnosis not present

## 2017-07-15 DIAGNOSIS — F325 Major depressive disorder, single episode, in full remission: Secondary | ICD-10-CM | POA: Diagnosis not present

## 2017-07-15 DIAGNOSIS — I129 Hypertensive chronic kidney disease with stage 1 through stage 4 chronic kidney disease, or unspecified chronic kidney disease: Secondary | ICD-10-CM | POA: Diagnosis not present

## 2017-07-15 DIAGNOSIS — N183 Chronic kidney disease, stage 3 (moderate): Secondary | ICD-10-CM | POA: Diagnosis not present

## 2017-09-23 DIAGNOSIS — H2511 Age-related nuclear cataract, right eye: Secondary | ICD-10-CM | POA: Diagnosis not present

## 2017-09-24 DIAGNOSIS — R350 Frequency of micturition: Secondary | ICD-10-CM | POA: Diagnosis not present

## 2017-09-24 DIAGNOSIS — N399 Disorder of urinary system, unspecified: Secondary | ICD-10-CM | POA: Diagnosis not present

## 2017-10-01 ENCOUNTER — Other Ambulatory Visit: Payer: Self-pay

## 2017-10-01 ENCOUNTER — Inpatient Hospital Stay (HOSPITAL_COMMUNITY)
Admission: EM | Admit: 2017-10-01 | Discharge: 2017-10-03 | DRG: 392 | Disposition: A | Discharge status: Home / Self Care | Payer: Medicare Other | Attending: Internal Medicine | Admitting: Internal Medicine

## 2017-10-01 ENCOUNTER — Encounter (HOSPITAL_COMMUNITY): Payer: Self-pay

## 2017-10-01 DIAGNOSIS — Z8249 Family history of ischemic heart disease and other diseases of the circulatory system: Secondary | ICD-10-CM

## 2017-10-01 DIAGNOSIS — F439 Reaction to severe stress, unspecified: Secondary | ICD-10-CM | POA: Diagnosis not present

## 2017-10-01 DIAGNOSIS — Z8261 Family history of arthritis: Secondary | ICD-10-CM

## 2017-10-01 DIAGNOSIS — Z800 Family history of malignant neoplasm of digestive organs: Secondary | ICD-10-CM

## 2017-10-01 DIAGNOSIS — I10 Essential (primary) hypertension: Secondary | ICD-10-CM | POA: Diagnosis not present

## 2017-10-01 DIAGNOSIS — M1990 Unspecified osteoarthritis, unspecified site: Secondary | ICD-10-CM | POA: Diagnosis present

## 2017-10-01 DIAGNOSIS — Z8701 Personal history of pneumonia (recurrent): Secondary | ICD-10-CM

## 2017-10-01 DIAGNOSIS — R1310 Dysphagia, unspecified: Principal | ICD-10-CM | POA: Diagnosis present

## 2017-10-01 DIAGNOSIS — N390 Urinary tract infection, site not specified: Secondary | ICD-10-CM | POA: Diagnosis not present

## 2017-10-01 DIAGNOSIS — Z79899 Other long term (current) drug therapy: Secondary | ICD-10-CM

## 2017-10-01 DIAGNOSIS — B9620 Unspecified Escherichia coli [E. coli] as the cause of diseases classified elsewhere: Secondary | ICD-10-CM | POA: Diagnosis present

## 2017-10-01 DIAGNOSIS — Z9842 Cataract extraction status, left eye: Secondary | ICD-10-CM

## 2017-10-01 DIAGNOSIS — R1319 Other dysphagia: Secondary | ICD-10-CM

## 2017-10-01 DIAGNOSIS — K219 Gastro-esophageal reflux disease without esophagitis: Secondary | ICD-10-CM | POA: Diagnosis not present

## 2017-10-01 DIAGNOSIS — Z91011 Allergy to milk products: Secondary | ICD-10-CM

## 2017-10-01 DIAGNOSIS — Z882 Allergy status to sulfonamides status: Secondary | ICD-10-CM

## 2017-10-01 DIAGNOSIS — R1314 Dysphagia, pharyngoesophageal phase: Secondary | ICD-10-CM | POA: Diagnosis not present

## 2017-10-01 DIAGNOSIS — E860 Dehydration: Secondary | ICD-10-CM | POA: Diagnosis present

## 2017-10-01 DIAGNOSIS — M810 Age-related osteoporosis without current pathological fracture: Secondary | ICD-10-CM | POA: Diagnosis present

## 2017-10-01 DIAGNOSIS — E876 Hypokalemia: Secondary | ICD-10-CM | POA: Diagnosis not present

## 2017-10-01 DIAGNOSIS — F419 Anxiety disorder, unspecified: Secondary | ICD-10-CM | POA: Diagnosis present

## 2017-10-01 DIAGNOSIS — R1312 Dysphagia, oropharyngeal phase: Secondary | ICD-10-CM | POA: Diagnosis not present

## 2017-10-01 DIAGNOSIS — Z87440 Personal history of urinary (tract) infections: Secondary | ICD-10-CM

## 2017-10-01 DIAGNOSIS — Z961 Presence of intraocular lens: Secondary | ICD-10-CM | POA: Diagnosis present

## 2017-10-01 LAB — CBC WITH DIFFERENTIAL/PLATELET
Basophils Absolute: 0 10*3/uL (ref 0.0–0.1)
Basophils Relative: 0 %
EOS ABS: 0 10*3/uL (ref 0.0–0.7)
EOS PCT: 0 %
HCT: 45.2 % (ref 36.0–46.0)
Hemoglobin: 15.5 g/dL — ABNORMAL HIGH (ref 12.0–15.0)
LYMPHS ABS: 1.4 10*3/uL (ref 0.7–4.0)
Lymphocytes Relative: 10 %
MCH: 29.9 pg (ref 26.0–34.0)
MCHC: 34.3 g/dL (ref 30.0–36.0)
MCV: 87.3 fL (ref 78.0–100.0)
MONO ABS: 1.2 10*3/uL — AB (ref 0.1–1.0)
Monocytes Relative: 9 %
Neutro Abs: 10.9 10*3/uL — ABNORMAL HIGH (ref 1.7–7.7)
Neutrophils Relative %: 81 %
PLATELETS: 321 10*3/uL (ref 150–400)
RBC: 5.18 MIL/uL — AB (ref 3.87–5.11)
RDW: 13.5 % (ref 11.5–15.5)
WBC: 13.5 10*3/uL — AB (ref 4.0–10.5)

## 2017-10-01 LAB — COMPREHENSIVE METABOLIC PANEL
ALBUMIN: 4.6 g/dL (ref 3.5–5.0)
ALT: 22 U/L (ref 0–44)
AST: 27 U/L (ref 15–41)
Alkaline Phosphatase: 49 U/L (ref 38–126)
Anion gap: 13 (ref 5–15)
BILIRUBIN TOTAL: 1.9 mg/dL — AB (ref 0.3–1.2)
BUN: 18 mg/dL (ref 8–23)
CO2: 27 mmol/L (ref 22–32)
CREATININE: 1.02 mg/dL — AB (ref 0.44–1.00)
Calcium: 9.6 mg/dL (ref 8.9–10.3)
Chloride: 102 mmol/L (ref 98–111)
GFR calc Af Amer: 58 mL/min — ABNORMAL LOW (ref >60)
GFR, EST NON AFRICAN AMERICAN: 50 mL/min — AB (ref >60)
GLUCOSE: 127 mg/dL — AB (ref 70–99)
Potassium: 2.9 mmol/L — ABNORMAL LOW (ref 3.5–5.1)
Sodium: 142 mmol/L (ref 135–145)
TOTAL PROTEIN: 7.5 g/dL (ref 6.5–8.1)

## 2017-10-01 LAB — MAGNESIUM: MAGNESIUM: 2 mg/dL (ref 1.7–2.4)

## 2017-10-01 MED ORDER — SODIUM CHLORIDE 0.9 % IV SOLN
1.0000 g | Freq: Once | INTRAVENOUS | Status: AC
  Administered 2017-10-01: 1 g via INTRAVENOUS
  Filled 2017-10-01: qty 10

## 2017-10-01 MED ORDER — ALUM & MAG HYDROXIDE-SIMETH 200-200-20 MG/5ML PO SUSP
5.0000 mL | Freq: Once | ORAL | Status: DC
  Filled 2017-10-01: qty 30

## 2017-10-01 MED ORDER — LACTATED RINGERS IV SOLN
INTRAVENOUS | Status: DC
  Administered 2017-10-02: 01:00:00 via INTRAVENOUS

## 2017-10-01 MED ORDER — SODIUM CHLORIDE 0.9 % IV BOLUS
1000.0000 mL | Freq: Once | INTRAVENOUS | Status: AC
Start: 2017-10-01 — End: 2017-10-01
  Administered 2017-10-01: 1000 mL via INTRAVENOUS

## 2017-10-01 MED ORDER — ENOXAPARIN SODIUM 40 MG/0.4ML ~~LOC~~ SOLN
40.0000 mg | Freq: Every day | SUBCUTANEOUS | Status: DC
  Administered 2017-10-02 – 2017-10-03 (×2): 40 mg via SUBCUTANEOUS
  Filled 2017-10-01 (×2): qty 0.4

## 2017-10-01 MED ORDER — ACETAMINOPHEN 650 MG RE SUPP
650.0000 mg | Freq: Four times a day (QID) | RECTAL | Status: DC | PRN
  Administered 2017-10-02 (×2): 650 mg via RECTAL
  Filled 2017-10-01 (×2): qty 1

## 2017-10-01 MED ORDER — POTASSIUM CHLORIDE 10 MEQ/100ML IV SOLN
10.0000 meq | INTRAVENOUS | Status: AC
  Administered 2017-10-01 – 2017-10-02 (×3): 10 meq via INTRAVENOUS
  Filled 2017-10-01 (×3): qty 100

## 2017-10-01 MED ORDER — ONDANSETRON HCL 4 MG/2ML IJ SOLN
4.0000 mg | Freq: Four times a day (QID) | INTRAMUSCULAR | Status: DC | PRN
  Administered 2017-10-02: 4 mg via INTRAVENOUS
  Filled 2017-10-01: qty 2

## 2017-10-01 MED ORDER — LORAZEPAM 2 MG/ML IJ SOLN
0.5000 mg | Freq: Once | INTRAMUSCULAR | Status: AC
  Administered 2017-10-01: 0.5 mg via INTRAVENOUS
  Filled 2017-10-01: qty 1

## 2017-10-01 MED ORDER — ACETAMINOPHEN 325 MG PO TABS: 650.0000 mg | ORAL_TABLET | Freq: Four times a day (QID) | ORAL | Status: DC | PRN

## 2017-10-01 MED ORDER — METOPROLOL TARTRATE 5 MG/5ML IV SOLN
5.0000 mg | Freq: Four times a day (QID) | INTRAVENOUS | Status: DC
  Administered 2017-10-01 – 2017-10-03 (×6): 5 mg via INTRAVENOUS
  Filled 2017-10-01 (×6): qty 5

## 2017-10-01 MED ORDER — SODIUM CHLORIDE 0.9 % IV SOLN
1.0000 g | INTRAVENOUS | Status: DC
  Administered 2017-10-02: 1 g via INTRAVENOUS
  Filled 2017-10-01: qty 1
  Filled 2017-10-01: qty 10

## 2017-10-01 MED ORDER — ONDANSETRON HCL 4 MG PO TABS: 4.0000 mg | ORAL_TABLET | Freq: Four times a day (QID) | ORAL | Status: DC | PRN

## 2017-10-01 MED ORDER — LORAZEPAM 2 MG/ML IJ SOLN
0.5000 mg | INTRAMUSCULAR | Status: DC | PRN
  Administered 2017-10-02 – 2017-10-03 (×2): 0.5 mg via INTRAVENOUS
  Filled 2017-10-01 (×2): qty 1

## 2017-10-01 MED ORDER — HYDRALAZINE HCL 20 MG/ML IJ SOLN
5.0000 mg | INTRAMUSCULAR | Status: DC | PRN
  Administered 2017-10-01: 5 mg via INTRAVENOUS
  Filled 2017-10-01: qty 1

## 2017-10-01 NOTE — ED Notes (Signed)
Pt reports dysphagia since 1900 last night, she has not been able to take her medicines.  She states she has had same problem in the past and was supposed to do exercises to strengthen her throat but she has not been doing them.  She was not able to sleep last night d/t excessive saliva that she had to keep spitting out.  Pt is A&OX 4.  In NAD.

## 2017-10-01 NOTE — ED Provider Notes (Signed)
[facility] DEPT Provider Note   CSN: [id number] Arrival date & time: 10/01/17  1350     History   Chief Complaint Chief Complaint  Patient presents with  . Dysphagia    HPI Savannah Diaz is a 84 y.o. female.  HPI  84 year old female with a history of esophageal dysphasia presents with recurrent issues.  She states that since last night she is been unable to swallow.  She recently started on amoxicillin for a UTI 3 days ago.  She states she had a culture that grew E. coli.  She is been having dysuria and other UTI symptoms.  No fevers.  Was doing fine taking the amoxicillin twice daily until last night.  She took it once and then while she was eating she started to have a dysphasia episode.  Typically does not last this long but it has in the past.  Since around 7 PM she is been unable to tolerate any oral fluids.  She frequently has to spit up clear mucus that comes from her throat.  There is some discomfort in her throat but no real pain.  No chest pain or shortness of breath.  No abdominal pain.  Went to urgent care and was unable to swallow water and it came back up so she came to the ER for evaluation.  Past Medical History:  Diagnosis Date  . Anxiety   . Arthritis    "qwhere; but it doesn't hurt" (05/12/2012)  . Cataract    left  . Depression   . Dysphagia    "have had it for 7 yr; got worse 4 days ago" (05/12/2012)  . GERD (gastroesophageal reflux disease)   . Hypertension   . Migraines    "menopause cured them" (05/12/2012)  . Osteoporosis     Patient Active Problem List   Diagnosis Date Noted  . High risk medication use 08/08/2016  . UTI (urinary tract infection) 03/22/2016  . Influenza A 03/22/2016  . Fall 03/21/2016  . 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 Aspiration pneumonia ([facility]) 03/21/2016  . Transient alteration of awareness 05/10/2015  . Dehydration with hypernatremia 05/13/2012  . Hypokalemia 05/12/2012  .  Dysphagia 05/10/2012  . Hypertension 04/28/2011  . Osteoarthritis 04/28/2011  . Agoraphobia 04/28/2011    Past Surgical History:  Procedure Laterality Date  . CATARACT EXTRACTION W/ INTRAOCULAR LENS IMPLANT Left ? 2010  . ESOPHAGOGASTRODUODENOSCOPY N/A 05/10/2012   Procedure: ESOPHAGOGASTRODUODENOSCOPY (EGD);  Surgeon: Jerene Bears, MD;  Location: [facility];  Service: Gastroenterology;  Laterality: N/A;  . TONSILLECTOMY  1940's     OB History   None      Home Medications    Prior to Admission medications   Medication Sig Start Date End Date Taking? Authorizing Provider  amitriptyline (ELAVIL) 50 MG tablet Take 25 mg by mouth at bedtime as needed. Takes 5 tabs nightly-current dosage 04/28/11  Yes Marletta Lor, MD  atenolol (TENORMIN) 50 MG tablet Take 1.5 tablets (75 mg total) by mouth daily. 04/28/11  Yes Marletta Lor, MD  diazepam (VALIUM) 5 MG tablet Take 2.5 mg by mouth every 12 (twelve) hours as needed for anxiety.    Yes [provider]  mirtazapine (REMERON) 15 MG tablet Take 7.5 mg by mouth at bedtime. 09/13/17  Yes [provider]  ranitidine (ZANTAC) 150 MG capsule Take 150 mg by mouth 2 (two) times daily. 09/26/17  Yes [provider]  traZODone (DESYREL) 50 MG tablet Take  175 mg by mouth at bedtime.   Yes [provider]    Family History Family History  Problem Relation Age of Onset  . Arthritis Mother   . Hypertension Mother   . Dementia Mother   . Cancer Father        colon     Social History Social History   Tobacco Use  . Smoking status: Never Smoker  . Smokeless tobacco: Never Used  Substance Use Topics  . Alcohol use: No    Alcohol/week: 0.0 standard drinks    Comment: 05/12/2012 "have a drink hardly ever anymore"  . Drug use: No     Allergies   Lactose intolerance (gi); Lactulose; and Sulfa antibiotics   Review of Systems Review of Systems  HENT: Positive for trouble swallowing.     Respiratory: Negative for cough and shortness of breath.   Cardiovascular: Negative for chest pain.  Gastrointestinal: Negative for abdominal pain and vomiting.  Genitourinary: Positive for dysuria.  All other systems reviewed and are negative.    Physical Exam Updated Vital Signs BP (!) 200/108 (BP Location: Right Arm)   Pulse (!) 101   Temp 99.7 F (37.6 C)   Resp 16   Ht 5' (1.524 m)   Wt 49 kg   SpO2 97%   BMI 21.09 kg/m   Physical Exam  Constitutional: She is oriented to person, place, and time. She appears well-developed and well-nourished.  HENT:  Head: Normocephalic and atraumatic.  Right Ear: External ear normal.  Left Ear: External ear normal.  Nose: Nose normal.  Mouth/Throat: Uvula is midline. Mucous membranes are dry. No oropharyngeal exudate, posterior oropharyngeal edema, posterior oropharyngeal erythema or tonsillar abscesses.  Eyes: Right eye exhibits no discharge. Left eye exhibits no discharge.  Neck: Neck supple.  Cardiovascular: Regular rhythm and normal heart sounds. Tachycardia present.  Pulmonary/Chest: Effort normal and breath sounds normal. No stridor. She has no wheezes.  Abdominal: Soft. She exhibits no distension. There is no tenderness.  Neurological: She is alert and oriented to person, place, and time.  Skin: Skin is warm and dry.  Nursing note and vitals reviewed.    ED Treatments / Results  Labs (all labs ordered are listed, but only abnormal results are displayed) Labs Reviewed  COMPREHENSIVE METABOLIC PANEL - Abnormal; Notable for the following components:      Result Value   Potassium 2.9 (*)    Glucose, Bld 127 (*)    Creatinine, Ser 1.02 (*)    Total Bilirubin 1.9 (*)    GFR calc non Af Amer 50 (*)    GFR calc Af Amer 58 (*)    All other components within normal limits  CBC WITH DIFFERENTIAL/PLATELET - Abnormal; Notable for the following components:   WBC 13.5 (*)    RBC 5.18 (*)    Hemoglobin 15.5 (*)    Neutro Abs  10.9 (*)    Monocytes Absolute 1.2 (*)    All other components within normal limits  MAGNESIUM  BASIC METABOLIC PANEL    EKG None  Radiology No results found.  Procedures Procedures (including critical care time)  Medications Ordered in ED Medications  potassium chloride 10 mEq in 100 mL IVPB (10 mEq Intravenous New Bag/Given 10/01/17 2227)  lactated ringers infusion (has no administration in time range)  cefTRIAXone (ROCEPHIN) 1 g in sodium chloride 0.9 % 100 mL IVPB (has no administration in time range)  metoprolol tartrate (LOPRESSOR) injection 5 mg (5 mg Intravenous Given 10/01/17 2223)  LORazepam (ATIVAN) injection 0.5 mg (has no administration in time range)  acetaminophen (TYLENOL) tablet 650 mg (has no administration in time range)    Or  acetaminophen (TYLENOL) suppository 650 mg (has no administration in time range)  ondansetron (ZOFRAN) tablet 4 mg (has no administration in time range)    Or  ondansetron (ZOFRAN) injection 4 mg (has no administration in time range)  enoxaparin (LOVENOX) injection 40 mg (has no administration in time range)  hydrALAZINE (APRESOLINE) injection 5 mg (5 mg Intravenous Given 10/01/17 2319)  sodium chloride 0.9 % bolus 1,000 mL (1,000 mLs Intravenous New Bag/Given 10/01/17 2031)  cefTRIAXone (ROCEPHIN) 1 g in sodium chloride 0.9 % 100 mL IVPB (1 g Intravenous New Bag/Given 10/01/17 2033)  LORazepam (ATIVAN) injection 0.5 mg (0.5 mg Intravenous Given 10/01/17 2032)     Initial Impression / Assessment and Plan / ED Course  I have reviewed the triage vital signs and the nursing notes.  Pertinent labs & imaging results that were available during my care of the patient were reviewed by me and considered in my medical decision making (see chart for details).     Patient has had a extensive work-up for this dysphasia.  No obvious finding.  She is not in distress and has a protected airway with no vomiting but only frequent spitting up.  While this did  start while eating last night this is not really consistent with a food impaction.  This is happened many times before.  Unfortunately she does appear dehydrated and has hypokalemia.  This will be need to be repleted.  She also is unable to take her antibiotics because of the symptoms and thus will need to be admitted while she is treated symptomatically.  Dr. Alcario Drought, hospitalist, to admit.  Final Clinical Impressions(s) / ED Diagnoses   Final diagnoses:  Esophageal dysphagia  Hypokalemia    ED Discharge Orders    None       Sherwood Gambler, MD 10/01/17 2329

## 2017-10-01 NOTE — ED Triage Notes (Signed)
Pt states that she has esophageal dysphagia, and is supposed to do exercises to help with it. Pt also has a UTI, which she is taking abx for. Pt states that Wednesday evening, she had a "spasm" where she spits out mucous. Pt states that she is still "tight" in her throat. Pt speaking in full sentences, in NAD. Pt states at urgent care, she tried swallowing water, but "couldn't get it all down". Pt also states there are a lot of familial issues going on as well, causing anxiety.

## 2017-10-01 NOTE — H&P (Addendum)
History and Physical    Savannah Diaz GXQ:[id number] DOB: 1932-09-06 DOA: 10/01/2017  PCP: Marletta Lor, MD  Patient coming from: Home  I have personally briefly reviewed patient's old medical records in [place]  Chief Complaint: Dysphagia  HPI: Savannah Diaz is a 84 y.o. female with medical history significant of recurrent dysphagia episodes for the past 10 years, neg EGD 2014.  She recently started on amoxicillin for a UTI 3 days ago.  She states she had a culture that grew E. coli.  She is been having dysuria and other UTI symptoms.  No fevers.  Was doing fine taking the amoxicillin twice daily until last night.  She took it once and then while she was eating she started to have a dysphasia episode.  Typically does not last this long but it has in the past.  Since around 7 PM she is been unable to tolerate any oral fluids.  She frequently has to spit up clear mucus that comes from her throat.  There is some discomfort in her throat but no real pain.  No chest pain or shortness of breath.  No abdominal pain.  Unable to swallow water.  No history consistent with food impaction really, didn't onset right after eating.  Dysphagia has been managed by swallowing exercises in past by SLP.  Neg EGD in 2014.  Neg barium swallow May 2018.   ED Course: Rocephin, and ativan 0.5mg  IV given.  Noted to have hypokalemia.  Hospitalist asked to admit.   Review of Systems: As per HPI otherwise 10 point review of systems negative.   Past Medical History:  Diagnosis Date  . Anxiety   . Arthritis    "qwhere; but it doesn't hurt" (05/12/2012)  . Cataract    left  . Depression   . Dysphagia    "have had it for 7 yr; got worse 4 days ago" (05/12/2012)  . GERD (gastroesophageal reflux disease)   . Hypertension   . Migraines    "menopause cured them" (05/12/2012)  . Osteoporosis     Past Surgical History:  Procedure Laterality Date  . CATARACT EXTRACTION W/ INTRAOCULAR LENS IMPLANT  Left ? 2010  . ESOPHAGOGASTRODUODENOSCOPY N/A 05/10/2012   Procedure: ESOPHAGOGASTRODUODENOSCOPY (EGD);  Surgeon: Jerene Bears, MD;  Location: [facility];  Service: Gastroenterology;  Laterality: N/A;  . TONSILLECTOMY  1940's     reports that she has never smoked. She has never used smokeless tobacco. She reports that she does not drink alcohol or use drugs.  Allergies  Allergen Reactions  . Lactose Intolerance (Gi) Other (See Comments)    Gi tract issues  . Lactulose     Gi tract issues  . Sulfa Antibiotics Hives    Childhood allergy     Family History  Problem Relation Age of Onset  . Arthritis Mother   . Hypertension Mother   . Dementia Mother   . Cancer Father        colon      Prior to Admission medications   Medication Sig Start Date End Date Taking? Authorizing Provider  amitriptyline (ELAVIL) 50 MG tablet Take 25 mg by mouth at bedtime as needed. Takes 5 tabs nightly-current dosage 04/28/11  Yes Marletta Lor, MD  atenolol (TENORMIN) 50 MG tablet Take 1.5 tablets (75 mg total) by mouth daily. 04/28/11  Yes Marletta Lor, MD  diazepam (VALIUM) 5 MG tablet Take 2.5 mg by mouth every 12 (twelve) hours as needed for anxiety.  Yes [provider]  mirtazapine (REMERON) 15 MG tablet Take 7.5 mg by mouth at bedtime. 09/13/17  Yes [provider]  ranitidine (ZANTAC) 150 MG capsule Take 150 mg by mouth 2 (two) times daily. 09/26/17  Yes [provider]  traZODone (DESYREL) 50 MG tablet Take 175 mg by mouth at bedtime.   Yes [provider]    Physical Exam: Vitals:   10/01/17 1403 10/01/17 1628 10/01/17 1903 10/01/17 2108  BP:  (!) 159/100 (!) 174/127 (!) 178/108  Pulse:  (!) 127 (!) 122 (!) 116  Resp:  16 15 15   Temp:      TempSrc:      SpO2:  98% 100% 95%  Weight: 49 kg     Height: 5' (1.524 m)       Constitutional: NAD, calm, comfortable Eyes: PERRL, lids and conjunctivae normal ENMT: Mucous membranes are moist.  Posterior pharynx clear of any exudate or lesions.Normal dentition.  Neck: normal, supple, no masses, no thyromegaly Respiratory: clear to auscultation bilaterally, no wheezing, no crackles. Normal respiratory effort. No accessory muscle use.  Cardiovascular: Regular rate and rhythm, no murmurs / rubs / gallops. No extremity edema. 2+ pedal pulses. No carotid bruits.  Abdomen: no tenderness, no masses palpated. No hepatosplenomegaly. Bowel sounds positive.  Musculoskeletal: no clubbing / cyanosis. No joint deformity upper and lower extremities. Good ROM, no contractures. Normal muscle tone.  Skin: no rashes, lesions, ulcers. No induration Neurologic: CN 2-12 grossly intact. Sensation intact, DTR normal. Strength 5/5 in all 4.  Psychiatric: Normal judgment and insight. Alert and oriented x 3. Normal mood.    Labs on Admission: I have personally reviewed following labs and imaging studies  CBC: Recent Labs  Lab 10/01/17 2013  WBC 13.5*  NEUTROABS 10.9*  HGB 15.5*  HCT 45.2  MCV 87.3  PLT 628   Basic Metabolic Panel: Recent Labs  Lab 10/01/17 2013  NA 142  K 2.9*  CL 102  CO2 27  GLUCOSE 127*  BUN 18  CREATININE 1.02*  CALCIUM 9.6  MG 2.0   GFR: Estimated Creatinine Clearance: 30.5 mL/min (A) (by C-G formula based on SCr of 1.02 mg/dL (H)). Liver Function Tests: Recent Labs  Lab 10/01/17 2013  AST 27  ALT 22  ALKPHOS 49  BILITOT 1.9*  PROT 7.5  ALBUMIN 4.6   No results for input(s): LIPASE, AMYLASE in the last 168 hours. No results for input(s): AMMONIA in the last 168 hours. Coagulation Profile: No results for input(s): INR, PROTIME in the last 168 hours. Cardiac Enzymes: No results for input(s): CKTOTAL, CKMB, CKMBINDEX, TROPONINI in the last 168 hours. BNP (last 3 results) No results for input(s): PROBNP in the last 8760 hours. HbA1C: No results for input(s): HGBA1C in the last 72 hours. CBG: No results for input(s): GLUCAP in the last 168 hours. Lipid  Profile: No results for input(s): CHOL, HDL, LDLCALC, TRIG, CHOLHDL, LDLDIRECT in the last 72 hours. Thyroid Function Tests: No results for input(s): TSH, T4TOTAL, FREET4, T3FREE, THYROIDAB in the last 72 hours. Anemia Panel: No results for input(s): VITAMINB12, FOLATE, FERRITIN, TIBC, IRON, RETICCTPCT in the last 72 hours. Urine analysis:    Component Value Date/Time   COLORURINE YELLOW 03/22/2016 0153   APPEARANCEUR CLEAR 03/22/2016 0153   LABSPEC 1.009 03/22/2016 0153   PHURINE 5.0 03/22/2016 0153   GLUCOSEU NEGATIVE 03/22/2016 0153   HGBUR NEGATIVE 03/22/2016 0153   BILIRUBINUR NEGATIVE 03/22/2016 0153   KETONESUR NEGATIVE 03/22/2016 0153   PROTEINUR NEGATIVE  03/22/2016 0153   UROBILINOGEN 0.2 05/14/2012 1317   NITRITE NEGATIVE 03/22/2016 0153   LEUKOCYTESUR LARGE (A) 03/22/2016 0153    Radiological Exams on Admission: No results found.  EKG: Independently reviewed.  Assessment/Plan Principal Problem:   Dysphagia Active Problems:   Hypertension   Hypokalemia   UTI (urinary tract infection)    1. Dysphagia - 1. Chronic recurrent dysphagia episodes 2. Will try ativan IV PRN (seems to have worked during last couple ED visits, either that or dysphagia just got better on its own) 3. SLP eval in AM 4. Call GI in AM if not improved 5. Managing secretions okay 6. NPO 7. IVF: LR at 75 2. Hypokalemia - 1. Replace 2. Repeat BMP in AM 3. UTI - 1. Will put on rocephin since unable to take PO augmentin 4. HTN - 1. Will put on scheduled IV metoprolol since unable to take PO atenolol and having rebound HTN / S.Tach  DVT prophylaxis: Lovenox Code Status: Full - dw pt and family Family Communication: Family at bedisde Disposition Plan: Home after admit Consults called: None Admission status: Place in obs - convert to IP if not taking POs tomorrow   Etta Quill DO [facility] Pager [phone] Only works nights!  If 7AM-7PM, please contact the primary day  team physician taking care of patient  [email or web address] Password [email or web address]  10/01/2017, 10:18 PM

## 2017-10-01 NOTE — ED Notes (Signed)
Pt takes blood pressure meds. Has been unable to swallow to comply with regiment since 1900 08/07

## 2017-10-02 DIAGNOSIS — Z8261 Family history of arthritis: Secondary | ICD-10-CM | POA: Diagnosis not present

## 2017-10-02 DIAGNOSIS — I10 Essential (primary) hypertension: Secondary | ICD-10-CM | POA: Diagnosis not present

## 2017-10-02 DIAGNOSIS — M1990 Unspecified osteoarthritis, unspecified site: Secondary | ICD-10-CM | POA: Diagnosis present

## 2017-10-02 DIAGNOSIS — Z8249 Family history of ischemic heart disease and other diseases of the circulatory system: Secondary | ICD-10-CM | POA: Diagnosis not present

## 2017-10-02 DIAGNOSIS — Z9842 Cataract extraction status, left eye: Secondary | ICD-10-CM | POA: Diagnosis not present

## 2017-10-02 DIAGNOSIS — E876 Hypokalemia: Secondary | ICD-10-CM

## 2017-10-02 DIAGNOSIS — F419 Anxiety disorder, unspecified: Secondary | ICD-10-CM | POA: Diagnosis present

## 2017-10-02 DIAGNOSIS — Z87440 Personal history of urinary (tract) infections: Secondary | ICD-10-CM | POA: Diagnosis not present

## 2017-10-02 DIAGNOSIS — R1310 Dysphagia, unspecified: Principal | ICD-10-CM

## 2017-10-02 DIAGNOSIS — M810 Age-related osteoporosis without current pathological fracture: Secondary | ICD-10-CM | POA: Diagnosis present

## 2017-10-02 DIAGNOSIS — K219 Gastro-esophageal reflux disease without esophagitis: Secondary | ICD-10-CM | POA: Diagnosis present

## 2017-10-02 DIAGNOSIS — Z800 Family history of malignant neoplasm of digestive organs: Secondary | ICD-10-CM | POA: Diagnosis not present

## 2017-10-02 DIAGNOSIS — B9620 Unspecified Escherichia coli [E. coli] as the cause of diseases classified elsewhere: Secondary | ICD-10-CM | POA: Diagnosis present

## 2017-10-02 DIAGNOSIS — E860 Dehydration: Secondary | ICD-10-CM | POA: Diagnosis present

## 2017-10-02 DIAGNOSIS — N390 Urinary tract infection, site not specified: Secondary | ICD-10-CM

## 2017-10-02 DIAGNOSIS — Z91011 Allergy to milk products: Secondary | ICD-10-CM | POA: Diagnosis not present

## 2017-10-02 DIAGNOSIS — Z79899 Other long term (current) drug therapy: Secondary | ICD-10-CM | POA: Diagnosis not present

## 2017-10-02 DIAGNOSIS — Z8701 Personal history of pneumonia (recurrent): Secondary | ICD-10-CM | POA: Diagnosis not present

## 2017-10-02 DIAGNOSIS — Z882 Allergy status to sulfonamides status: Secondary | ICD-10-CM | POA: Diagnosis not present

## 2017-10-02 DIAGNOSIS — Z961 Presence of intraocular lens: Secondary | ICD-10-CM | POA: Diagnosis present

## 2017-10-02 LAB — BASIC METABOLIC PANEL
ANION GAP: 5 (ref 5–15)
BUN: 14 mg/dL (ref 8–23)
CO2: 28 mmol/L (ref 22–32)
Calcium: 8.3 mg/dL — ABNORMAL LOW (ref 8.9–10.3)
Chloride: 111 mmol/L (ref 98–111)
Creatinine, Ser: 0.87 mg/dL (ref 0.44–1.00)
GFR calc Af Amer: >60 mL/min (ref >60)
GFR calc non Af Amer: >60 mL/min (ref >60)
GLUCOSE: 113 mg/dL — AB (ref 70–99)
Potassium: 3.3 mmol/L — ABNORMAL LOW (ref 3.5–5.1)
Sodium: 144 mmol/L (ref 135–145)

## 2017-10-02 MED ORDER — POTASSIUM CHLORIDE 10 MEQ/100ML IV SOLN
10.0000 meq | INTRAVENOUS | Status: AC
  Administered 2017-10-02 (×3): 10 meq via INTRAVENOUS
  Filled 2017-10-02 (×3): qty 100

## 2017-10-02 MED ORDER — METOCLOPRAMIDE HCL 5 MG/ML IJ SOLN: 10.0000 mg | Freq: Once | INTRAMUSCULAR | Status: DC

## 2017-10-02 MED ORDER — DIPHENHYDRAMINE HCL 50 MG/ML IJ SOLN: 25.0000 mg | Freq: Once | INTRAMUSCULAR | Status: DC

## 2017-10-02 MED ORDER — KETOROLAC TROMETHAMINE 15 MG/ML IJ SOLN
15.0000 mg | Freq: Once | INTRAMUSCULAR | Status: AC
  Administered 2017-10-03: 15 mg via INTRAVENOUS
  Filled 2017-10-02: qty 1

## 2017-10-02 MED ORDER — FAMOTIDINE IN NACL 20-0.9 MG/50ML-% IV SOLN
20.0000 mg | Freq: Two times a day (BID) | INTRAVENOUS | Status: DC | PRN
  Administered 2017-10-02 (×2): 20 mg via INTRAVENOUS
  Filled 2017-10-02 (×2): qty 50

## 2017-10-02 MED ORDER — SODIUM CHLORIDE 0.45 % IV SOLN
INTRAVENOUS | Status: DC
  Administered 2017-10-02: 13:00:00 via INTRAVENOUS

## 2017-10-02 NOTE — Evaluation (Signed)
Clinical/Bedside Swallow Evaluation Patient Details  Name: ELDA DUNKERSON MRN: [id number] Date of Birth: March 20, 1932  Today's Date: 10/02/2017 Time: SLP Start Time (ACUTE ONLY): 1340 SLP Stop Time (ACUTE ONLY): 1507 SLP Time Calculation (min) (ACUTE ONLY): 87 min  Past Medical History:  Past Medical History:  Diagnosis Date  . Anxiety   . Arthritis    "qwhere; but it doesn't hurt" (05/12/2012)  . Cataract    left  . Depression   . Dysphagia    "have had it for 7 yr; got worse 4 days ago" (05/12/2012)  . GERD (gastroesophageal reflux disease)   . Hypertension   . Migraines    "menopause cured them" (05/12/2012)  . Osteoporosis    Past Surgical History:  Past Surgical History:  Procedure Laterality Date  . CATARACT EXTRACTION W/ INTRAOCULAR LENS IMPLANT Left ? 2010  . ESOPHAGOGASTRODUODENOSCOPY N/A 05/10/2012   Procedure: ESOPHAGOGASTRODUODENOSCOPY (EGD);  Surgeon: Jerene Bears, MD;  Location: [facility];  Service: Gastroenterology;  Laterality: N/A;  . TONSILLECTOMY  1940's   HPI:  ARIYA BOHANNON is a 84 y.o. female with medical history significant of recurrent dysphagia episodes for the past 10 years, neg EGD 2014. She recently started on amoxicillin for a UTI 3 days ago. Was doing fine taking the amoxicillin twice daily until last night. She took it once and then while she was eating she started to have a dysphagia episode. Typically does not last this long but it has in the past. For three hours she was unable to tolerate any oral fluids. She frequently has to spit up clear mucus that comes from her throat. There is some discomfort in her throat but no real pain. No chest pain or shortness of breath. No abdominal pain.  Unable to swallow water.  No history consistent with food impaction really, didn't onset right after eating. Dysphagia has been managed by swallowing exercises in past by SLP.  Pt. had EGD 05/10/12 revealing abnormal mucousa on upper 1/3 of esophagus with question  of pill induced injury and smal hiatal hernia.   MBS May 2018 showed mild pharyngeal suspected primary cervical esophageal/esophageal dysphagia. MD gave ativan, GI to be consulted if symptoms dont resolve.    Assessment / Plan / Recommendation Clinical Impression  Pt demonstrates resolution of symptoms, is able to swallow water consecutively without difficulty, no trouble with solids. Extensive discussion with pt and daughter regarding history. Pt symptoms are and have been suspected to be an esophageal spasm, likely of the upper esophageal sphincter. She has been referred to SLP in the past with HEP that she has not followed recently. Daughter feels that the exercises reduced episodes of spasm, though there is no research to support that these exercises would address spasm. Likely, pts awareness of problem and ability to complete an effortful swallow to combat spasm has improved.  Pt may continue course of recommended exercises if they are beneficial to her subjectively.  Encouraged this technique, along with recommendation to conduct an effortful swallow of puree or pudding to provide sensory feedback and pharyngeal pressure to open esophagus. Pt may also benefit from f/u with GI or ENT for medical management. Initiated diet, will sign off acutely.  SLP Visit Diagnosis: Dysphagia, pharyngoesophageal phase (R13.14)    Aspiration Risk  Mild aspiration risk    Diet Recommendation Regular;Thin liquid   Liquid Administration via: Cup;Straw Medication Administration: Crushed with puree Supervision: Patient able to self feed Compensations: Slow rate;Effortful swallow Postural Changes: Seated upright at 90 degrees  Other  Recommendations Recommended Consults: Consider ENT evaluation;Consider GI evaluation Oral Care Recommendations: Oral care BID   Follow up Recommendations None      Frequency and Duration            Prognosis        Swallow Study   General HPI: ANGELYS YETMAN is a 84  y.o. female with medical history significant of recurrent dysphagia episodes for the past 10 years, neg EGD 2014. She recently started on amoxicillin for a UTI 3 days ago. Was doing fine taking the amoxicillin twice daily until last night. She took it once and then while she was eating she started to have a dysphagia episode. Typically does not last this long but it has in the past. For three hours she was unable to tolerate any oral fluids. She frequently has to spit up clear mucus that comes from her throat. There is some discomfort in her throat but no real pain. No chest pain or shortness of breath. No abdominal pain.  Unable to swallow water.  No history consistent with food impaction really, didn't onset right after eating. Dysphagia has been managed by swallowing exercises in past by SLP.  Pt. had EGD 05/10/12 revealing abnormal mucousa on upper 1/3 of esophagus with question of pill induced injury and smal hiatal hernia.   MBS May 2018 showed mild pharyngeal suspected primary cervical esophageal/esophageal dysphagia. MD gave ativan, GI to be consulted if symptoms dont resolve.  Type of Study: Bedside Swallow Evaluation Previous Swallow Assessment: see HPI Diet Prior to this Study: NPO Temperature Spikes Noted: No Respiratory Status: Room air History of Recent Intubation: No Behavior/Cognition: Alert;Cooperative;Pleasant mood Self-Feeding Abilities: Able to feed self Patient Positioning: Upright in bed Baseline Vocal Quality: Normal Volitional Cough: Strong Volitional Swallow: Able to elicit    Oral/Motor/Sensory Function Overall Oral Motor/Sensory Function: Within functional limits   Ice Chips Ice chips: Not tested   Thin Liquid Thin Liquid: Within functional limits    Nectar Thick Nectar Thick Liquid: Not tested   Honey Thick Honey Thick Liquid: Not tested   Puree Puree: Within functional limits   Solid     Solid: Within functional limits     [facility], MA CCC-SLP  [phone]  Lynann Beaver 10/02/2017,3:12 PM

## 2017-10-02 NOTE — Care Management Note (Signed)
Case Management Note  Patient Details  Name: GURTHA PICKER MRN: [id number] Date of Birth: 03-20-1932  Subjective/Objective:      Dysphagia and poss infection/k+=2.9 getting kcl 10 mEg x3 q 1 hr/ iv rocephin/ iv pepcid/              Action/Plan: will follow for cm needs   Expected Discharge Date:                  Expected Discharge Plan:  Home/Self Care  In-House Referral:     Discharge planning Services  CM Consult  Post Acute Care Choice:    Choice offered to:     DME Arranged:    DME Agency:     HH Arranged:    HH Agency:     Status of Service:  In process, will continue to follow  If discussed at Long Length of Stay Meetings, dates discussed:    Additional Comments:  Leeroy Cha, RN 10/02/2017, 12:20 PM

## 2017-10-02 NOTE — Progress Notes (Signed)
PROGRESS NOTE  Savannah Diaz ZOX:[id number] DOB: 03-07-1932 DOA: 10/01/2017 PCP: Marletta Lor, MD  HPI/Recap of past 80 hours: 84 year old female with medical history significant for recurrent dysphagia for the past couple of years, presented to the ER complaining of dysphasia shortly after eating a small piece of chicken.  Patient reported feeling "spasm in her throat", and subsequently found it difficult to swallow.  Patient had seen frequently spit up, to clear mucus from her throat.  No pain associated in her throat, just a discomfort.  Patient denied any chest pain, shortness of breath, was able to talk in full sentences.  Dysphasia has been managed by swallowing exercises in the past by SLP.  Patient had a negative EGD in 2014 and a negative barium swallow in May 2018.  Of note, patient was recently started on ?  Amoxicillin for UTI about 3 days ago, 18 she had a culture that grew E. coli and has been having dysuria.  In the ED, patient was noted to have hypokalemia.  Patient admitted for further management   Today, patient reports feeling better, but still concerned about eating.  Patient was able to talk in full sentences, no drooling noted.  Patient denies any chest pain, shortness of breath, fever/chills.  Assessment/Plan: Principal Problem:   Dysphagia Active Problems:   Hypertension   Hypokalemia   UTI (urinary tract infection)  Chronic recurrent dysphasia Feels somewhat better today SLP eval pending Currently n.p.o., managing secretions well Continue IV fluids Continue as needed Ativan  Hypokalemia Replace PRN  Leukocytosis ?  Reactive, afebrile, ??UTI On IV Rocephin Daily CBC  UTI Diagnosed as an outpt, was placed on ?  Amoxicillin for ?E.coli UTI Continue IV Rocephin  Hypertension Stable Continue IV hydralazine PRN, IV Lopressor scheduled Hold home atenolol, currently NPO   Code Status: Full  Family Communication: None at bedside  Disposition  Plan: Home likely 10/03/2017   Consultants:  None  Procedures:  None  Antimicrobials:  Rocephin  DVT prophylaxis: Lovenox   Objective: Vitals:   10/01/17 2227 10/01/17 2307 10/02/17 0002 10/02/17 0650  BP:  (!) 200/108 (!) 163/93 (!) 145/83  Pulse:  (!) 101 (!) 109 87  Resp:  16  18  Temp: 99.8 F (37.7 C) 99.7 F (37.6 C)  98.3 F (36.8 C)  TempSrc: Oral     SpO2:  97%  96%  Weight:      Height:        Intake/Output Summary (Last 24 hours) at 10/02/2017 1228 Last data filed at 10/02/2017 1057 Gross per 24 hour  Intake 497.57 ml  Output -  Net 497.57 ml   Filed Weights   10/01/17 1403  Weight: 49 kg    Exam:   General: NAD  Cardiovascular: S1, S2 present  Respiratory: CTAB  Abdomen: Soft, nontender, nondistended, bowel sounds present  Musculoskeletal: No pedal edema bilaterally  Skin: Normal  Psychiatry: Normal mood   Data Reviewed: CBC: Recent Labs  Lab 10/01/17 2013  WBC 13.5*  NEUTROABS 10.9*  HGB 15.5*  HCT 45.2  MCV 87.3  PLT 811   Basic Metabolic Panel: Recent Labs  Lab 10/01/17 2013 10/02/17 0544  NA 142 144  K 2.9* 3.3*  CL 102 111  CO2 27 28  GLUCOSE 127* 113*  BUN 18 14  CREATININE 1.02* 0.87  CALCIUM 9.6 8.3*  MG 2.0  --    GFR: Estimated Creatinine Clearance: 35.8 mL/min (by C-G formula based on SCr of 0.87 mg/dL). Liver  Function Tests: Recent Labs  Lab 10/01/17 2013  AST 27  ALT 22  ALKPHOS 49  BILITOT 1.9*  PROT 7.5  ALBUMIN 4.6   No results for input(s): LIPASE, AMYLASE in the last 168 hours. No results for input(s): AMMONIA in the last 168 hours. Coagulation Profile: No results for input(s): INR, PROTIME in the last 168 hours. Cardiac Enzymes: No results for input(s): CKTOTAL, CKMB, CKMBINDEX, TROPONINI in the last 168 hours. BNP (last 3 results) No results for input(s): PROBNP in the last 8760 hours. HbA1C: No results for input(s): HGBA1C in the last 72 hours. CBG: No results for input(s):  GLUCAP in the last 168 hours. Lipid Profile: No results for input(s): CHOL, HDL, LDLCALC, TRIG, CHOLHDL, LDLDIRECT in the last 72 hours. Thyroid Function Tests: No results for input(s): TSH, T4TOTAL, FREET4, T3FREE, THYROIDAB in the last 72 hours. Anemia Panel: No results for input(s): VITAMINB12, FOLATE, FERRITIN, TIBC, IRON, RETICCTPCT in the last 72 hours. Urine analysis:    Component Value Date/Time   COLORURINE YELLOW 03/22/2016 0153   APPEARANCEUR CLEAR 03/22/2016 0153   LABSPEC 1.009 03/22/2016 0153   PHURINE 5.0 03/22/2016 0153   GLUCOSEU NEGATIVE 03/22/2016 0153   HGBUR NEGATIVE 03/22/2016 0153   BILIRUBINUR NEGATIVE 03/22/2016 0153   KETONESUR NEGATIVE 03/22/2016 0153   PROTEINUR NEGATIVE 03/22/2016 0153   UROBILINOGEN 0.2 05/14/2012 1317   NITRITE NEGATIVE 03/22/2016 0153   LEUKOCYTESUR LARGE (A) 03/22/2016 0153   Sepsis Labs: @LABRCNTIP (procalcitonin:4,lacticidven:4)  )No results found for this or any previous visit (from the past 240 hour(s)).    Studies: No results found.  Scheduled Meds: . alum & mag hydroxide-simeth  5 mL Oral Once  . enoxaparin (LOVENOX) injection  40 mg Subcutaneous Daily  . metoprolol tartrate  5 mg Intravenous Q6H    Continuous Infusions: . sodium chloride    . cefTRIAXone (ROCEPHIN)  IV    . famotidine (PEPCID) IV 20 mg (10/02/17 0121)     LOS: 0 days     Alma Friendly, MD [facility]   If 7PM-7AM, please contact night-coverage [email or web address] Password [facility] 10/02/2017, 12:28 PM

## 2017-10-03 LAB — CBC WITH DIFFERENTIAL/PLATELET
BASOS ABS: 0.1 10*3/uL (ref 0.0–0.1)
BASOS PCT: 1 %
EOS PCT: 9 %
Eosinophils Absolute: 0.6 10*3/uL (ref 0.0–0.7)
HCT: 39.7 % (ref 36.0–46.0)
Hemoglobin: 13.1 g/dL (ref 12.0–15.0)
Lymphocytes Relative: 35 %
Lymphs Abs: 2.1 10*3/uL (ref 0.7–4.0)
MCH: 29.6 pg (ref 26.0–34.0)
MCHC: 33 g/dL (ref 30.0–36.0)
MCV: 89.8 fL (ref 78.0–100.0)
MONO ABS: 0.6 10*3/uL (ref 0.1–1.0)
Monocytes Relative: 10 %
NEUTROS ABS: 2.8 10*3/uL (ref 1.7–7.7)
Neutrophils Relative %: 45 %
PLATELETS: 259 10*3/uL (ref 150–400)
RBC: 4.42 MIL/uL (ref 3.87–5.11)
RDW: 13.8 % (ref 11.5–15.5)
WBC: 6.1 10*3/uL (ref 4.0–10.5)

## 2017-10-03 LAB — BASIC METABOLIC PANEL
ANION GAP: 6 (ref 5–15)
BUN: 11 mg/dL (ref 8–23)
CALCIUM: 8.6 mg/dL — AB (ref 8.9–10.3)
CO2: 27 mmol/L (ref 22–32)
Chloride: 110 mmol/L (ref 98–111)
Creatinine, Ser: 0.83 mg/dL (ref 0.44–1.00)
Glucose, Bld: 96 mg/dL (ref 70–99)
POTASSIUM: 3.4 mmol/L — AB (ref 3.5–5.1)
Sodium: 143 mmol/L (ref 135–145)

## 2017-10-03 MED ORDER — ATENOLOL 25 MG PO TABS
75.0000 mg | ORAL_TABLET | Freq: Every day | ORAL | Status: DC
  Administered 2017-10-03: 75 mg via ORAL
  Filled 2017-10-03: qty 3

## 2017-10-03 MED ORDER — FAMOTIDINE 20 MG PO TABS: 20.0000 mg | ORAL_TABLET | Freq: Two times a day (BID) | ORAL | Status: DC | PRN

## 2017-10-03 MED ORDER — DIAZEPAM 5 MG PO TABS
2.5000 mg | ORAL_TABLET | Freq: Two times a day (BID) | ORAL | Status: DC | PRN
  Administered 2017-10-03: 2.5 mg via ORAL
  Filled 2017-10-03: qty 1

## 2017-10-03 NOTE — Progress Notes (Signed)
Discharge instructions and medications discussed with patient.  AVS given to patient.  All questions answered.  

## 2017-10-03 NOTE — Discharge Summary (Signed)
Discharge Summary  Savannah Diaz [id number] DOB: Sep 23, 1932  PCP: Marletta Lor, MD  Admit date: 10/01/2017 Discharge date: 10/03/2017  Time spent: 35 mins  Recommendations for Outpatient Follow-up:  1. PCP   Discharge Diagnoses:  Active Hospital Problems   Diagnosis Date Noted  . Dysphagia 05/10/2012  . UTI (urinary tract infection) 03/22/2016  . Hypokalemia 05/12/2012  . Hypertension 04/28/2011    Resolved Hospital Problems  No resolved problems to display.    Discharge Condition: Stable  Diet recommendation: Heart healthy  Vitals:   10/02/17 2002 10/03/17 0401  BP: 134/81 (!) 172/86  Pulse: 86 72  Resp: 17 19  Temp: 98.9 F (37.2 C) 98 F (36.7 C)  SpO2: 97% 97%    History of present illness:  84 year old female with medical history significant for recurrent dysphagia for the past couple of years, presented to the ER complaining of dysphasia shortly after eating a small piece of chicken.  Patient reported feeling "spasm in her throat", and subsequently found it difficult to swallow.  Patient had seen frequently spit up, to clear mucus from her throat.  No pain associated in her throat, just a discomfort.  Patient denied any chest pain, shortness of breath, was able to talk in full sentences.  Dysphasia has been managed by swallowing exercises in the past by SLP.  Patient had a negative EGD in 2014 and a negative barium swallow in May 2018.  Of note, patient was recently started on ?  Amoxicillin for UTI about 3 days ago, 18 she had a culture that grew E. coli and has been having dysuria.  In the ED, patient was noted to have hypokalemia.  Patient admitted for further management   Today, patient reports feeling better, tolerated diet without any issues. Patient denies any chest pain, shortness of breath, fever/chills.  Hospital Course:  Principal Problem:   Dysphagia Active Problems:   Hypertension   Hypokalemia   UTI (urinary tract  infection)  Acute on chronic recurrent dysphagia likely due to esophageal spasm Resolved, likely triggered by current stressful situation at home with hx of anxiety  SLP eval, advanced regular diet, may benefit from GI or ENT outpatient consultation PCP to follow up and possibly refer pt for further work up  Hypokalemia Replaced PRN  Leukocytosis Resolved ?Reactive, afebrile, ??UTI  UTI Diagnosed as an outpt, was placed on Amoxicillin for E.coli UTI S/P IV Rocephin, complete home amoxicillin as prescribed  Hypertension Stable Continue home atenolol   Procedures:  None   Consultations:  None  Discharge Exam: BP (!) 172/86 (BP Location: Right Arm)   Pulse 72   Temp 98 F (36.7 C)   Resp 19   Ht 5' (1.524 m)   Wt 49 kg   SpO2 97%   BMI 21.09 kg/m   General: NAD  Cardiovascular: S1, S2 present Respiratory: CTAB  Discharge Instructions You were cared for by a hospitalist during your hospital stay. If you have any questions about your discharge medications or the care you received while you were in the hospital after you are discharged, you can call the unit and asked to speak with the hospitalist on call if the hospitalist that took care of you is not available. Once you are discharged, your primary care physician will handle any further medical issues. Please note that NO REFILLS for any discharge medications will be authorized once you are discharged, as it is imperative that you return to your primary care physician (or establish a relationship  with a primary care physician if you do not have one) for your aftercare needs so that they can reassess your need for medications and monitor your lab values.   Allergies as of 10/03/2017      Reactions   Lactose Intolerance (gi) Other (See Comments)   Gi tract issues   Lactulose    Gi tract issues   Sulfa Antibiotics Hives   Childhood allergy       Medication List    TAKE these medications   amitriptyline 50  MG tablet Commonly known as:  ELAVIL Take 25 mg by mouth at bedtime as needed. Takes 5 tabs nightly-current dosage   atenolol 50 MG tablet Commonly known as:  TENORMIN Take 1.5 tablets (75 mg total) by mouth daily.   diazepam 5 MG tablet Commonly known as:  VALIUM Take 2.5 mg by mouth every 12 (twelve) hours as needed for anxiety.   mirtazapine 15 MG tablet Commonly known as:  REMERON Take 7.5 mg by mouth at bedtime.   ranitidine 150 MG capsule Commonly known as:  ZANTAC Take 150 mg by mouth 2 (two) times daily.   traZODone 50 MG tablet Commonly known as:  DESYREL Take 175 mg by mouth at bedtime.      Allergies  Allergen Reactions  . Lactose Intolerance (Gi) Other (See Comments)    Gi tract issues  . Lactulose     Gi tract issues  . Sulfa Antibiotics Hives    Childhood allergy    Follow-up Information    Marletta Lor, MD. Schedule an appointment as soon as possible for a visit in 1 week(s).   Specialty:  Internal Medicine Contact information: [place] [address] [phone]            The results of significant diagnostics from this hospitalization (including imaging, microbiology, ancillary and laboratory) are listed below for reference.    Significant Diagnostic Studies: No results found.  Microbiology: No results found for this or any previous visit (from the past 240 hour(s)).   Labs: Basic Metabolic Panel: Recent Labs  Lab 10/01/17 2013 10/02/17 0544 10/03/17 0636  NA 142 144 143  K 2.9* 3.3* 3.4*  CL 102 111 110  CO2 27 28 27   GLUCOSE 127* 113* 96  BUN 18 14 11   CREATININE 1.02* 0.87 0.83  CALCIUM 9.6 8.3* 8.6*  MG 2.0  --   --    Liver Function Tests: Recent Labs  Lab 10/01/17 2013  AST 27  ALT 22  ALKPHOS 49  BILITOT 1.9*  PROT 7.5  ALBUMIN 4.6   No results for input(s): LIPASE, AMYLASE in the last 168 hours. No results for input(s): AMMONIA in the last 168 hours. CBC: Recent Labs  Lab  10/01/17 2013 10/03/17 0636  WBC 13.5* 6.1  NEUTROABS 10.9* 2.8  HGB 15.5* 13.1  HCT 45.2 39.7  MCV 87.3 89.8  PLT 321 259   Cardiac Enzymes: No results for input(s): CKTOTAL, CKMB, CKMBINDEX, TROPONINI in the last 168 hours. BNP: BNP (last 3 results) No results for input(s): BNP in the last 8760 hours.  ProBNP (last 3 results) No results for input(s): PROBNP in the last 8760 hours.  CBG: No results for input(s): GLUCAP in the last 168 hours.     Signed:  Alma Friendly, MD [facility] 10/03/2017, 12:19 PM

## 2017-10-03 NOTE — Progress Notes (Signed)
PHARMACIST - PHYSICIAN COMMUNICATION  DR:   Horris Latino  CONCERNING: IV to Oral Route Change Policy  RECOMMENDATION: This patient is receiving famotidine PRN by the intravenous route.  Based on criteria approved by the Pharmacy and Therapeutics Committee, the intravenous medication(s) is/are being converted to the equivalent oral dose form(s).   DESCRIPTION: These criteria include:  The patient is eating (either orally or via tube) and/or has been taking other orally administered medications for a least 24 hours  The patient has no evidence of active gastrointestinal bleeding or impaired GI absorption (gastrectomy, short bowel, patient on TNA or NPO).  If you have questions about this conversion, please contact the [place] 10/03/2017 10:23 AM

## 2017-10-05 ENCOUNTER — Telehealth: Payer: Self-pay | Admitting: Family Medicine

## 2017-10-05 NOTE — Telephone Encounter (Signed)
I tried to reach pt, line was busy, will try again later.

## 2017-10-06 NOTE — Telephone Encounter (Signed)
I spoke with pt and she has changed providers, her PCP now is Dr. [name].

## 2017-10-07 DIAGNOSIS — K224 Dyskinesia of esophagus: Secondary | ICD-10-CM | POA: Diagnosis not present

## 2017-10-07 DIAGNOSIS — F411 Generalized anxiety disorder: Secondary | ICD-10-CM | POA: Diagnosis not present

## 2017-10-07 DIAGNOSIS — N183 Chronic kidney disease, stage 3 (moderate): Secondary | ICD-10-CM | POA: Diagnosis not present

## 2017-10-07 DIAGNOSIS — I129 Hypertensive chronic kidney disease with stage 1 through stage 4 chronic kidney disease, or unspecified chronic kidney disease: Secondary | ICD-10-CM | POA: Diagnosis not present

## 2017-10-07 DIAGNOSIS — R1310 Dysphagia, unspecified: Secondary | ICD-10-CM | POA: Diagnosis not present

## 2017-10-16 DIAGNOSIS — Z23 Encounter for immunization: Secondary | ICD-10-CM | POA: Diagnosis not present

## 2017-11-19 DIAGNOSIS — K6289 Other specified diseases of anus and rectum: Secondary | ICD-10-CM | POA: Diagnosis not present

## 2017-11-30 DIAGNOSIS — F331 Major depressive disorder, recurrent, moderate: Secondary | ICD-10-CM | POA: Diagnosis not present

## 2017-11-30 DIAGNOSIS — F411 Generalized anxiety disorder: Secondary | ICD-10-CM | POA: Diagnosis not present

## 2017-12-03 DIAGNOSIS — F331 Major depressive disorder, recurrent, moderate: Secondary | ICD-10-CM | POA: Diagnosis not present

## 2017-12-18 DIAGNOSIS — Z23 Encounter for immunization: Secondary | ICD-10-CM | POA: Diagnosis not present

## 2017-12-18 DIAGNOSIS — F325 Major depressive disorder, single episode, in full remission: Secondary | ICD-10-CM | POA: Diagnosis not present

## 2017-12-18 DIAGNOSIS — Z1389 Encounter for screening for other disorder: Secondary | ICD-10-CM | POA: Diagnosis not present

## 2017-12-18 DIAGNOSIS — N183 Chronic kidney disease, stage 3 (moderate): Secondary | ICD-10-CM | POA: Diagnosis not present

## 2017-12-18 DIAGNOSIS — I129 Hypertensive chronic kidney disease with stage 1 through stage 4 chronic kidney disease, or unspecified chronic kidney disease: Secondary | ICD-10-CM | POA: Diagnosis not present

## 2017-12-18 DIAGNOSIS — Z79899 Other long term (current) drug therapy: Secondary | ICD-10-CM | POA: Diagnosis not present

## 2017-12-18 DIAGNOSIS — Z0000 Encounter for general adult medical examination without abnormal findings: Secondary | ICD-10-CM | POA: Diagnosis not present

## 2017-12-28 DIAGNOSIS — F411 Generalized anxiety disorder: Secondary | ICD-10-CM | POA: Diagnosis not present

## 2017-12-28 DIAGNOSIS — F331 Major depressive disorder, recurrent, moderate: Secondary | ICD-10-CM | POA: Diagnosis not present

## 2018-01-04 DIAGNOSIS — F331 Major depressive disorder, recurrent, moderate: Secondary | ICD-10-CM | POA: Diagnosis not present

## 2018-01-22 ENCOUNTER — Emergency Department (HOSPITAL_COMMUNITY)
Admission: EM | Admit: 2018-01-22 | Discharge: 2018-01-22 | Disposition: A | Discharge status: Home / Self Care | Payer: Medicare Other | Attending: Emergency Medicine | Admitting: Emergency Medicine

## 2018-01-22 ENCOUNTER — Encounter (HOSPITAL_COMMUNITY): Payer: Self-pay | Admitting: Internal Medicine

## 2018-01-22 ENCOUNTER — Emergency Department (HOSPITAL_COMMUNITY): Admission: EM | Admit: 2018-01-22 | Payer: Medicare Other | Source: Home / Self Care

## 2018-01-22 DIAGNOSIS — Z79899 Other long term (current) drug therapy: Secondary | ICD-10-CM | POA: Insufficient documentation

## 2018-01-22 DIAGNOSIS — I10 Essential (primary) hypertension: Secondary | ICD-10-CM | POA: Diagnosis not present

## 2018-01-22 DIAGNOSIS — R1310 Dysphagia, unspecified: Secondary | ICD-10-CM | POA: Insufficient documentation

## 2018-01-22 LAB — CBC WITH DIFFERENTIAL/PLATELET
ABS IMMATURE GRANULOCYTES: 0.02 10*3/uL (ref 0.00–0.07)
Basophils Absolute: 0 10*3/uL (ref 0.0–0.1)
Basophils Relative: 1 %
EOS ABS: 0.1 10*3/uL (ref 0.0–0.5)
Eosinophils Relative: 2 %
HEMATOCRIT: 41 % (ref 36.0–46.0)
Hemoglobin: 13.1 g/dL (ref 12.0–15.0)
IMMATURE GRANULOCYTES: 0 %
LYMPHS ABS: 1.4 10*3/uL (ref 0.7–4.0)
Lymphocytes Relative: 21 %
MCH: 29.2 pg (ref 26.0–34.0)
MCHC: 32 g/dL (ref 30.0–36.0)
MCV: 91.5 fL (ref 80.0–100.0)
MONOS PCT: 6 %
Monocytes Absolute: 0.4 10*3/uL (ref 0.1–1.0)
NEUTROS ABS: 4.6 10*3/uL (ref 1.7–7.7)
NEUTROS PCT: 70 %
Platelets: 268 10*3/uL (ref 150–400)
RBC: 4.48 MIL/uL (ref 3.87–5.11)
RDW: 13.6 % (ref 11.5–15.5)
WBC: 6.5 10*3/uL (ref 4.0–10.5)
nRBC: 0 % (ref 0.0–0.2)

## 2018-01-22 LAB — BASIC METABOLIC PANEL
ANION GAP: 10 (ref 5–15)
BUN: 11 mg/dL (ref 8–23)
CALCIUM: 9.5 mg/dL (ref 8.9–10.3)
CHLORIDE: 105 mmol/L (ref 98–111)
CO2: 28 mmol/L (ref 22–32)
CREATININE: 1.09 mg/dL — AB (ref 0.44–1.00)
GFR calc Af Amer: 54 mL/min — ABNORMAL LOW (ref >60)
GFR, EST NON AFRICAN AMERICAN: 47 mL/min — AB (ref >60)
GLUCOSE: 103 mg/dL — AB (ref 70–99)
Potassium: 3 mmol/L — ABNORMAL LOW (ref 3.5–5.1)
Sodium: 143 mmol/L (ref 135–145)

## 2018-01-22 MED ORDER — GLUCAGON HCL RDNA (DIAGNOSTIC) 1 MG IJ SOLR
1.0000 mg | Freq: Once | INTRAMUSCULAR | Status: AC
  Administered 2018-01-22: 1 mg via INTRAVENOUS
  Filled 2018-01-22: qty 1

## 2018-01-22 NOTE — ED Notes (Signed)
E-signature not available, pt verbalized understanding of DC instructions  

## 2018-01-22 NOTE — Discharge Instructions (Addendum)
Continue your medications as previously prescribed, and follow-up with your primary doctor or gastroenterologist as needed.

## 2018-01-22 NOTE — ED Provider Notes (Addendum)
[facility] EMERGENCY DEPARTMENT Provider Note   CSN: [id number] Arrival date & time: 01/22/18  1705     History   Chief Complaint Chief Complaint  Patient presents with  . Oral Swelling    HPI Savannah Diaz is a 84 y.o. female.  Patient is an 84 year old female with history of esophageal dysmotility presenting with complaints of difficulty swallowing.  She states that she ate lunch earlier today and since then has had difficulty swallowing and feels mucus building up in her throat.  She intermittently has to spit it out.  This has apparently been an issue for many years.  She has had work-up in the past including endoscopy and barium swallow, however both of been unremarkable.  She was given nitroglycerin to use, however this did not help.  She denies any difficulty breathing.  She denies any chest pain.     Past Medical History:  Diagnosis Date  . Anxiety   . Arthritis    "qwhere; but it doesn't hurt" (05/12/2012)  . Cataract    left  . Depression   . Dysphagia    "have had it for 7 yr; got worse 4 days ago" (05/12/2012)  . GERD (gastroesophageal reflux disease)   . Hypertension   . Migraines    "menopause cured them" (05/12/2012)  . Osteoporosis     Patient Active Problem List   Diagnosis Date Noted  . High risk medication use 08/08/2016  . UTI (urinary tract infection) 03/22/2016  . Influenza A 03/22/2016  . Fall 03/21/2016  . 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 Aspiration pneumonia ([place]) 03/21/2016  . Transient alteration of awareness 05/10/2015  . Dehydration with hypernatremia 05/13/2012  . Hypokalemia 05/12/2012  . Dysphagia 05/10/2012  . Hypertension 04/28/2011  . Osteoarthritis 04/28/2011  . Agoraphobia 04/28/2011    Past Surgical History:  Procedure Laterality Date  . CATARACT EXTRACTION W/ INTRAOCULAR LENS IMPLANT Left ? 2010  . ESOPHAGOGASTRODUODENOSCOPY N/A 05/10/2012   Procedure: ESOPHAGOGASTRODUODENOSCOPY  (EGD);  Surgeon: Jerene Bears, MD;  Location: [facility];  Service: Gastroenterology;  Laterality: N/A;  . TONSILLECTOMY  1940's     OB History   None      Home Medications    Prior to Admission medications   Medication Sig Start Date End Date Taking? Authorizing Provider  amitriptyline (ELAVIL) 50 MG tablet Take 25 mg by mouth at bedtime as needed. Takes 5 tabs nightly-current dosage 04/28/11   Marletta Lor, MD  atenolol (TENORMIN) 50 MG tablet Take 1.5 tablets (75 mg total) by mouth daily. 04/28/11   Marletta Lor, MD  diazepam (VALIUM) 5 MG tablet Take 2.5 mg by mouth every 12 (twelve) hours as needed for anxiety.     [provider]  mirtazapine (REMERON) 15 MG tablet Take 7.5 mg by mouth at bedtime. 09/13/17   [provider]  ranitidine (ZANTAC) 150 MG capsule Take 150 mg by mouth 2 (two) times daily. 09/26/17   [provider]  traZODone (DESYREL) 50 MG tablet Take 175 mg by mouth at bedtime.    [provider]    Family History Family History  Problem Relation Age of Onset  . Arthritis Mother   . Hypertension Mother   . Dementia Mother   . Cancer Father        colon     Social History Social History   Tobacco Use  . Smoking status: Never Smoker  . Smokeless tobacco: Never Used  Substance Use Topics  . Alcohol use: No    Alcohol/week: 0.0 standard drinks    Comment: 05/12/2012 "have a drink hardly ever anymore"  . Drug use: No     Allergies   Lactose intolerance (gi); Lactulose; and Sulfa antibiotics   Review of Systems Review of Systems  All other systems reviewed and are negative.    Physical Exam Updated Vital Signs BP (!) 182/112   Pulse 99   Resp 14   SpO2 99%   Physical Exam  Constitutional: She is oriented to person, place, and time. She appears well-developed and well-nourished. No distress.  HENT:  Head: Normocephalic and atraumatic.  Mouth/Throat: Oropharynx is clear and moist.  Neck:  Normal range of motion. Neck supple. No tracheal deviation present. No thyromegaly present.  Cardiovascular: Normal rate and regular rhythm. Exam reveals no gallop and no friction rub.  No murmur heard. Pulmonary/Chest: Effort normal and breath sounds normal. No respiratory distress. She has no wheezes.  Abdominal: Soft. Bowel sounds are normal. She exhibits no distension. There is no tenderness.  Musculoskeletal: Normal range of motion.  Lymphadenopathy:    She has no cervical adenopathy.  Neurological: She is alert and oriented to person, place, and time.  Skin: Skin is warm and dry. She is not diaphoretic.  Nursing note and vitals reviewed.    ED Treatments / Results  Labs (all labs ordered are listed, but only abnormal results are displayed) Labs Reviewed  CBC WITH DIFFERENTIAL/PLATELET  BASIC METABOLIC PANEL    EKG None  Radiology No results found.  Procedures Procedures (including critical care time)  Medications Ordered in ED Medications  glucagon (human recombinant) (GLUCAGEN) injection 1 mg (has no administration in time range)     Initial Impression / Assessment and Plan / ED Course  I have reviewed the triage vital signs and the nursing notes.  Pertinent labs & imaging results that were available during my care of the patient were reviewed by me and considered in my medical decision making (see chart for details).  Patient with history of esophageal dysmotility presenting with complaints of difficulty swallowing.  This began earlier this afternoon after eating lunch.  She has had multiple episodes of this in the past, however most of the time it resolved spontaneously.  Today's episode has not.  She is in no respiratory distress and her exam is unremarkable.  There is no stridor.  Laboratory studies were obtained which were unremarkable with the exception of a low potassium which appears chronic for her.  She was given glucagon and is now tolerating liquids with  no problems.  She feels much better.  At this point, I feel as though discharge is appropriate with follow-up as needed.  Final Clinical Impressions(s) / ED Diagnoses   Final diagnoses:  None    ED Discharge Orders    None       Veryl Speak, MD 01/22/18 Casimer Lanius    Veryl Speak, MD 01/22/18 2239

## 2018-01-22 NOTE — ED Notes (Signed)
Patient placed on cardiac monitoring d/t hypokalemia

## 2018-01-22 NOTE — ED Triage Notes (Signed)
Pt here c/o throat swelling and difficulty swallowing after eating lunch today. Pt has had this problem for many years and intermittently has throat swelling. Last time pt had this problem was 2 months ago. In the past, she has had trouble swallowing medications when this happens. Pt is able to speak in full clear sentences and is able to swallow at this time.

## 2018-02-01 DIAGNOSIS — F411 Generalized anxiety disorder: Secondary | ICD-10-CM | POA: Diagnosis not present

## 2018-02-01 DIAGNOSIS — F331 Major depressive disorder, recurrent, moderate: Secondary | ICD-10-CM | POA: Diagnosis not present

## 2018-03-01 DIAGNOSIS — I129 Hypertensive chronic kidney disease with stage 1 through stage 4 chronic kidney disease, or unspecified chronic kidney disease: Secondary | ICD-10-CM | POA: Diagnosis not present

## 2018-03-01 DIAGNOSIS — F325 Major depressive disorder, single episode, in full remission: Secondary | ICD-10-CM | POA: Diagnosis not present

## 2018-03-05 DIAGNOSIS — F331 Major depressive disorder, recurrent, moderate: Secondary | ICD-10-CM | POA: Diagnosis not present

## 2018-03-17 ENCOUNTER — Other Ambulatory Visit (HOSPITAL_COMMUNITY): Payer: Self-pay | Admitting: Psychiatry

## 2018-03-23 DIAGNOSIS — F331 Major depressive disorder, recurrent, moderate: Secondary | ICD-10-CM | POA: Diagnosis not present

## 2018-03-23 DIAGNOSIS — F411 Generalized anxiety disorder: Secondary | ICD-10-CM | POA: Diagnosis not present

## 2018-04-01 ENCOUNTER — Emergency Department (HOSPITAL_COMMUNITY)
Admission: EM | Admit: 2018-04-01 | Discharge: 2018-04-02 | Disposition: A | Discharge status: Home / Self Care | Payer: Medicare Other | Attending: Emergency Medicine | Admitting: Emergency Medicine

## 2018-04-01 ENCOUNTER — Other Ambulatory Visit: Payer: Self-pay

## 2018-04-01 ENCOUNTER — Encounter (HOSPITAL_COMMUNITY): Payer: Self-pay

## 2018-04-01 DIAGNOSIS — Z79899 Other long term (current) drug therapy: Secondary | ICD-10-CM | POA: Insufficient documentation

## 2018-04-01 DIAGNOSIS — R1310 Dysphagia, unspecified: Secondary | ICD-10-CM | POA: Diagnosis not present

## 2018-04-01 DIAGNOSIS — I10 Essential (primary) hypertension: Secondary | ICD-10-CM | POA: Insufficient documentation

## 2018-04-01 LAB — BASIC METABOLIC PANEL
Anion gap: 14 (ref 5–15)
BUN: 8 mg/dL (ref 8–23)
CO2: 28 mmol/L (ref 22–32)
Calcium: 9 mg/dL (ref 8.9–10.3)
Chloride: 102 mmol/L (ref 98–111)
Creatinine, Ser: 0.97 mg/dL (ref 0.44–1.00)
GFR calc non Af Amer: 54 mL/min — ABNORMAL LOW (ref >60)
Glucose, Bld: 119 mg/dL — ABNORMAL HIGH (ref 70–99)
POTASSIUM: 3.3 mmol/L — AB (ref 3.5–5.1)
Sodium: 144 mmol/L (ref 135–145)

## 2018-04-01 LAB — CBC WITH DIFFERENTIAL/PLATELET
Abs Immature Granulocytes: 0.02 10*3/uL (ref 0.00–0.07)
BASOS ABS: 0 10*3/uL (ref 0.0–0.1)
Basophils Relative: 0 %
Eosinophils Absolute: 0.1 10*3/uL (ref 0.0–0.5)
Eosinophils Relative: 1 %
HCT: 40.9 % (ref 36.0–46.0)
Hemoglobin: 13.4 g/dL (ref 12.0–15.0)
Immature Granulocytes: 0 %
LYMPHS PCT: 13 %
Lymphs Abs: 1 10*3/uL (ref 0.7–4.0)
MCH: 29.8 pg (ref 26.0–34.0)
MCHC: 32.8 g/dL (ref 30.0–36.0)
MCV: 91.1 fL (ref 80.0–100.0)
Monocytes Absolute: 0.5 10*3/uL (ref 0.1–1.0)
Monocytes Relative: 7 %
NRBC: 0 % (ref 0.0–0.2)
Neutro Abs: 6.1 10*3/uL (ref 1.7–7.7)
Neutrophils Relative %: 79 %
Platelets: 274 10*3/uL (ref 150–400)
RBC: 4.49 MIL/uL (ref 3.87–5.11)
RDW: 13.2 % (ref 11.5–15.5)
WBC: 7.8 10*3/uL (ref 4.0–10.5)

## 2018-04-01 MED ORDER — PROMETHAZINE HCL 25 MG PO TABS: 25.0000 mg | ORAL_TABLET | Freq: Four times a day (QID) | ORAL | 0 refills | Status: DC | PRN

## 2018-04-01 MED ORDER — SODIUM CHLORIDE 0.9 % IV BOLUS (SEPSIS)
500.0000 mL | Freq: Once | INTRAVENOUS | Status: AC
  Administered 2018-04-01: 500 mL via INTRAVENOUS

## 2018-04-01 MED ORDER — GLUCAGON HCL RDNA (DIAGNOSTIC) 1 MG IJ SOLR
1.0000 mg | Freq: Once | INTRAMUSCULAR | Status: AC
  Administered 2018-04-01: 1 mg via INTRAVENOUS
  Filled 2018-04-01: qty 1

## 2018-04-01 MED ORDER — LIDOCAINE VISCOUS HCL 2 % MT SOLN
15.0000 mL | Freq: Once | OROMUCOSAL | Status: AC
  Administered 2018-04-01: 15 mL via ORAL
  Filled 2018-04-01: qty 15

## 2018-04-01 MED ORDER — ALUM & MAG HYDROXIDE-SIMETH 200-200-20 MG/5ML PO SUSP
30.0000 mL | Freq: Once | ORAL | Status: AC
  Administered 2018-04-01: 30 mL via ORAL
  Filled 2018-04-01: qty 30

## 2018-04-01 MED ORDER — LORAZEPAM 2 MG/ML IJ SOLN
0.5000 mg | Freq: Once | INTRAMUSCULAR | Status: AC
  Administered 2018-04-01: 0.5 mg via INTRAVENOUS
  Filled 2018-04-01: qty 1

## 2018-04-01 NOTE — Discharge Instructions (Addendum)
Thank you for allowing me to care for you today. Please return to the emergency department if you have new or worsening symptoms. Take your medications as instructed.  ° °

## 2018-04-01 NOTE — ED Provider Notes (Signed)
[place] EMERGENCY DEPARTMENT Provider Note   CSN: [id number] Arrival date & time: 04/01/18  6767     History   Chief Complaint Chief Complaint  Patient presents with  . Dysphagia    HPI Savannah Diaz is a 85 y.o. female.  Patient is an 85 year old Caucasian female with past medical history of anxiety, hypertension, dysphasia who presents the emergency department for difficulty swallowing.  Patient presents with her and her daughter and they both state that this is an ongoing issue that has been going on for years.  She has had previous barium studies and swallow studies done.  Reports that she occasionally has trouble with swallowing but it usually resolves on its own.  Reports that she has had to be seen in the emergency department for similar instances and she was given medicine and it resolved and she was discharged.  Reports that she began to have trouble swallowing after lunch yesterday.  Reports that since then she has not been able to keep anything down including most of her own saliva.  She is not having any difficulty breathing, fever, cough.  The daughter states that she is very anxious and sometimes anxiety medication helps her swallow again.  Her blood pressure is elevated because the patient has not NUS take any of her blood pressure medication due to being unable to swallow them.  She does not think that she has any food bolus Or anything stuck to the throat.     Past Medical History:  Diagnosis Date  . Anxiety   . Hypertension     There are no active problems to display for this patient.      OB History   No obstetric history on file.      Home Medications    Prior to Admission medications   Medication Sig Start Date End Date Taking? Authorizing Provider  amLODipine (NORVASC) 2.5 MG tablet Take 2.5 mg by mouth daily.   Yes [provider]  atenolol (TENORMIN) 50 MG tablet Take 50 mg by mouth at bedtime.   Yes [provider]  famotidine (PEPCID) 40 MG tablet Take 40 mg by mouth at bedtime.   Yes [provider]  LORazepam (ATIVAN) 0.5 MG tablet Take 0.5-1 mg by mouth See admin instructions. Take 1 tablet (0.5mg ) in the morning, 2 tablets (1mg ) in the afternoon, and 2 tablets (1mg ) at night   Yes [provider]  mirtazapine (REMERON) 30 MG tablet Take 30 mg by mouth at bedtime.   Yes [provider]  traZODone (DESYREL) 50 MG tablet Take 175 mg by mouth at bedtime.   Yes [provider]  promethazine (PHENERGAN) 25 MG tablet Take 1 tablet (25 mg total) by mouth every 6 (six) hours as needed for nausea or vomiting. 04/01/18   Alveria Apley, PA-C    Family History No family history on file.  Social History Social History   Tobacco Use  . Smoking status: Never Smoker  . Smokeless tobacco: Never Used  Substance Use Topics  . Alcohol use: Not Currently  . Drug use: Not on file     Allergies   Sulfa antibiotics   Review of Systems Review of Systems  Constitutional: Negative.   HENT: Positive for trouble swallowing. Negative for sinus pressure and sore throat.   Respiratory: Negative for cough and shortness of breath.   Cardiovascular: Negative for chest pain.  Gastrointestinal: Negative for abdominal pain, nausea and vomiting.  Musculoskeletal: Negative for  back pain and myalgias.  Skin: Negative for rash.  Neurological: Negative for dizziness, light-headedness, numbness and headaches.     Physical Exam Updated Vital Signs BP (!) 170/92   Pulse 99   Temp 99.3 F (37.4 C) (Oral)   Resp 11   SpO2 98%   Physical Exam Vitals signs and nursing note reviewed.  Constitutional:      General: She is not in acute distress.    Appearance: Normal appearance. She is not ill-appearing, toxic-appearing or diaphoretic.     Comments: Pleasant, elderly female  HENT:     Head: Normocephalic and atraumatic.     Nose: Nose normal.     Mouth/Throat:      Mouth: Mucous membranes are moist.     Pharynx: Oropharynx is clear. No oropharyngeal exudate or posterior oropharyngeal erythema.     Comments: Speaking full sentences, no change in voice Eyes:     Conjunctiva/sclera: Conjunctivae normal.     Pupils: Pupils are equal, round, and reactive to light.  Cardiovascular:     Rate and Rhythm: Normal rate and regular rhythm.  Pulmonary:     Effort: Pulmonary effort is normal.     Breath sounds: Normal breath sounds. No wheezing, rhonchi or rales.  Musculoskeletal:     Right lower leg: No edema.     Left lower leg: No edema.  Skin:    General: Skin is warm.     Capillary Refill: Capillary refill takes less than 2 seconds.  Neurological:     General: No focal deficit present.     Mental Status: She is alert and oriented to person, place, and time.     Cranial Nerves: No cranial nerve deficit.     Sensory: No sensory deficit.     Motor: No weakness.     Coordination: Coordination normal.     Gait: Gait normal.  Psychiatric:        Mood and Affect: Mood normal.      ED Treatments / Results  Labs (all labs ordered are listed, but only abnormal results are displayed) Labs Reviewed  BASIC METABOLIC PANEL - Abnormal; Notable for the following components:      Result Value   Potassium 3.3 (*)    Glucose, Bld 119 (*)    GFR calc non Af Amer 54 (*)    All other components within normal limits  CBC WITH DIFFERENTIAL/PLATELET    EKG None  Radiology No results found.  Procedures Procedures (including critical care time)  Medications Ordered in ED Medications  glucagon (human recombinant) (GLUCAGEN) injection 1 mg (1 mg Intravenous Given 04/01/18 0933)  LORazepam (ATIVAN) injection 0.5 mg (0.5 mg Intravenous Given 04/01/18 0930)  sodium chloride 0.9 % bolus 500 mL (0 mLs Intravenous Stopped 04/01/18 1011)  alum & mag hydroxide-simeth (MAALOX/MYLANTA) 200-200-20 MG/5ML suspension 30 mL (30 mLs Oral Given 04/01/18 1126)    And  lidocaine  (XYLOCAINE) 2 % viscous mouth solution 15 mL (15 mLs Oral Given 04/01/18 1126)     Initial Impression / Assessment and Plan / ED Course  I have reviewed the triage vital signs and the nursing notes.  Pertinent labs & imaging results that were available during my care of the patient were reviewed by me and considered in my medical decision making (see chart for details).  Clinical Course as of Apr 02 1251  Thu Apr 01, 2018  0946 I reassessed the patient at this time.  She is sitting and watching TV comfortably.  She has history of intermittent episodes of this dysphasia.  This is similar to the past.  Reports that it either resolves on its own or with medicine given in the ED.  Glucagon and Ativan were just recently given.  Will give a chance for this medicine to start working and see how she does.   [KM]  4562 Patient tolerated PO challenge, viscous lidocaine, maalox. Reports slight cough but ready to go home. Dr. Darl Householder to also see the patient. Will send home with oral phenergan which may help with motility. Otherwise f/u PMD   [KM]    Clinical Course User Index [KM] Alveria Apley, PA-C    Based on review of vitals, medical screening exam, lab work and/or imaging, there does not appear to be an acute, emergent etiology for the patient's symptoms. Counseled pt on good return precautions and encouraged both PCP and ED follow-up as needed.  Prior to discharge, I also discussed incidental imaging findings with patient in detail and advised appropriate, recommended follow-up in detail.  Clinical Impression: 1. Dysphagia, unspecified type     Disposition: Discharge    This note was prepared with assistance of Dragon voice recognition software. Occasional wrong-word or sound-a-like substitutions may have occurred due to the inherent limitations of voice recognition software.   Final Clinical Impressions(s) / ED Diagnoses   Final diagnoses:  Dysphagia, unspecified type    ED Discharge  Orders         Ordered    promethazine (PHENERGAN) 25 MG tablet  Every 6 hours PRN     04/01/18 1251           Kristine Royal 04/01/18 1253    Drenda Freeze, MD 04/03/18 [phone]

## 2018-04-01 NOTE — ED Notes (Signed)
Pt does not feel comfortable taking PO at this time.

## 2018-04-01 NOTE — Progress Notes (Signed)
CSW made aware that pt is from [facility]. Per staff pt is ready to return to facility but has no transportation back. CSW has reached out to [facility] and was informed that they would be sending their driver to get pt. CSW spoke with Rose from [facility] and was updated that it would be 30 minutes or more as the driver is having to pick up multipe people.   CSW updated pt and pt asked that CSW sit pt in the lobby area of the  ED to wait for ride. At this time CSW has sat pt in the waitroom to wait for ride. CSW will sign off as there are no further needs.     Virgie Dad Kielan Dreisbach, MSW, [facility] Clinical Social Worker [phone]

## 2018-04-01 NOTE — ED Notes (Signed)
Pt very hesitant to try to swallow due to feeling anxious about being unable to swallow. However pt did reluctantly try and was successful passing swallow screen.

## 2018-04-01 NOTE — ED Triage Notes (Signed)
Pt states she has been unable to eat since yesterday d/t being unable to swallow. Pt states this has been happening intermittently X10 years. Pt maintaining secretions.

## 2018-04-06 ENCOUNTER — Encounter (HOSPITAL_COMMUNITY): Payer: Self-pay | Admitting: Internal Medicine

## 2018-04-14 DIAGNOSIS — N183 Chronic kidney disease, stage 3 (moderate): Secondary | ICD-10-CM | POA: Diagnosis not present

## 2018-04-14 DIAGNOSIS — F3341 Major depressive disorder, recurrent, in partial remission: Secondary | ICD-10-CM | POA: Diagnosis not present

## 2018-04-14 DIAGNOSIS — L821 Other seborrheic keratosis: Secondary | ICD-10-CM | POA: Diagnosis not present

## 2018-04-14 DIAGNOSIS — I129 Hypertensive chronic kidney disease with stage 1 through stage 4 chronic kidney disease, or unspecified chronic kidney disease: Secondary | ICD-10-CM | POA: Diagnosis not present

## 2018-04-22 DIAGNOSIS — F331 Major depressive disorder, recurrent, moderate: Secondary | ICD-10-CM | POA: Diagnosis not present

## 2018-06-07 DIAGNOSIS — Z79899 Other long term (current) drug therapy: Secondary | ICD-10-CM | POA: Diagnosis not present

## 2018-07-13 DIAGNOSIS — I129 Hypertensive chronic kidney disease with stage 1 through stage 4 chronic kidney disease, or unspecified chronic kidney disease: Secondary | ICD-10-CM | POA: Diagnosis not present

## 2018-07-13 DIAGNOSIS — N183 Chronic kidney disease, stage 3 (moderate): Secondary | ICD-10-CM | POA: Diagnosis not present

## 2018-09-09 ENCOUNTER — Other Ambulatory Visit: Payer: Self-pay

## 2018-09-09 ENCOUNTER — Encounter (HOSPITAL_COMMUNITY): Payer: Self-pay | Admitting: Emergency Medicine

## 2018-09-09 ENCOUNTER — Emergency Department (HOSPITAL_COMMUNITY): Payer: Medicare Other

## 2018-09-09 ENCOUNTER — Emergency Department (HOSPITAL_COMMUNITY)
Admission: EM | Admit: 2018-09-09 | Discharge: 2018-09-09 | Disposition: A | Discharge status: Home / Self Care | Payer: Medicare Other | Attending: Emergency Medicine | Admitting: Emergency Medicine

## 2018-09-09 DIAGNOSIS — Z20828 Contact with and (suspected) exposure to other viral communicable diseases: Secondary | ICD-10-CM | POA: Insufficient documentation

## 2018-09-09 DIAGNOSIS — Z03818 Encounter for observation for suspected exposure to other biological agents ruled out: Secondary | ICD-10-CM | POA: Diagnosis not present

## 2018-09-09 DIAGNOSIS — I129 Hypertensive chronic kidney disease with stage 1 through stage 4 chronic kidney disease, or unspecified chronic kidney disease: Secondary | ICD-10-CM | POA: Insufficient documentation

## 2018-09-09 DIAGNOSIS — N182 Chronic kidney disease, stage 2 (mild): Secondary | ICD-10-CM | POA: Diagnosis not present

## 2018-09-09 DIAGNOSIS — R1314 Dysphagia, pharyngoesophageal phase: Secondary | ICD-10-CM | POA: Diagnosis not present

## 2018-09-09 DIAGNOSIS — Z79899 Other long term (current) drug therapy: Secondary | ICD-10-CM | POA: Diagnosis not present

## 2018-09-09 DIAGNOSIS — R1310 Dysphagia, unspecified: Secondary | ICD-10-CM

## 2018-09-09 DIAGNOSIS — R030 Elevated blood-pressure reading, without diagnosis of hypertension: Secondary | ICD-10-CM

## 2018-09-09 DIAGNOSIS — I10 Essential (primary) hypertension: Secondary | ICD-10-CM | POA: Diagnosis not present

## 2018-09-09 DIAGNOSIS — K220 Achalasia of cardia: Secondary | ICD-10-CM

## 2018-09-09 LAB — CBC WITH DIFFERENTIAL/PLATELET
Abs Immature Granulocytes: 0.03 10*3/uL (ref 0.00–0.07)
Basophils Absolute: 0 10*3/uL (ref 0.0–0.1)
Basophils Relative: 0 %
Eosinophils Absolute: 0 10*3/uL (ref 0.0–0.5)
Eosinophils Relative: 0 %
HCT: 43.3 % (ref 36.0–46.0)
Hemoglobin: 14.3 g/dL (ref 12.0–15.0)
Immature Granulocytes: 0 %
Lymphocytes Relative: 13 %
Lymphs Abs: 1.1 10*3/uL (ref 0.7–4.0)
MCH: 30 pg (ref 26.0–34.0)
MCHC: 33 g/dL (ref 30.0–36.0)
MCV: 90.8 fL (ref 80.0–100.0)
Monocytes Absolute: 0.6 10*3/uL (ref 0.1–1.0)
Monocytes Relative: 7 %
Neutro Abs: 6.8 10*3/uL (ref 1.7–7.7)
Neutrophils Relative %: 80 %
Platelets: 252 10*3/uL (ref 150–400)
RBC: 4.77 MIL/uL (ref 3.87–5.11)
RDW: 13.4 % (ref 11.5–15.5)
WBC: 8.7 10*3/uL (ref 4.0–10.5)
nRBC: 0 % (ref 0.0–0.2)

## 2018-09-09 LAB — BASIC METABOLIC PANEL
Anion gap: 13 (ref 5–15)
BUN: 14 mg/dL (ref 8–23)
CO2: 22 mmol/L (ref 22–32)
Calcium: 9.2 mg/dL (ref 8.9–10.3)
Chloride: 107 mmol/L (ref 98–111)
Creatinine, Ser: 0.96 mg/dL (ref 0.44–1.00)
GFR calc Af Amer: >60 mL/min (ref >60)
GFR calc non Af Amer: 55 mL/min — ABNORMAL LOW (ref >60)
Glucose, Bld: 120 mg/dL — ABNORMAL HIGH (ref 70–99)
Potassium: 3.4 mmol/L — ABNORMAL LOW (ref 3.5–5.1)
Sodium: 142 mmol/L (ref 135–145)

## 2018-09-09 LAB — HEPATIC FUNCTION PANEL
ALT: 21 U/L (ref 0–44)
AST: 16 U/L (ref 15–41)
Albumin: 3.9 g/dL (ref 3.5–5.0)
Alkaline Phosphatase: 42 U/L (ref 38–126)
Bilirubin, Direct: 0.3 mg/dL — ABNORMAL HIGH (ref 0.0–0.2)
Indirect Bilirubin: 1.6 mg/dL — ABNORMAL HIGH (ref 0.3–0.9)
Total Bilirubin: 1.9 mg/dL — ABNORMAL HIGH (ref 0.3–1.2)
Total Protein: 6.4 g/dL — ABNORMAL LOW (ref 6.5–8.1)

## 2018-09-09 LAB — LIPASE, BLOOD: Lipase: 22 U/L (ref 11–51)

## 2018-09-09 LAB — SARS CORONAVIRUS 2 BY RT PCR (HOSPITAL ORDER, PERFORMED IN ~~LOC~~ HOSPITAL LAB): SARS Coronavirus 2: NEGATIVE

## 2018-09-09 MED ORDER — SODIUM CHLORIDE 0.9 % IV BOLUS
500.0000 mL | Freq: Once | INTRAVENOUS | Status: AC
  Administered 2018-09-09: 500 mL via INTRAVENOUS

## 2018-09-09 MED ORDER — GLUCAGON HCL RDNA (DIAGNOSTIC) 1 MG IJ SOLR
1.0000 mg | Freq: Once | INTRAMUSCULAR | Status: AC
  Administered 2018-09-09: 1 mg via INTRAVENOUS
  Filled 2018-09-09: qty 1

## 2018-09-09 MED ORDER — LORAZEPAM 2 MG/ML IJ SOLN
0.5000 mg | Freq: Once | INTRAMUSCULAR | Status: AC
  Administered 2018-09-09: 0.5 mg via INTRAVENOUS
  Filled 2018-09-09: qty 1

## 2018-09-09 NOTE — ED Notes (Signed)
Pt ambulated to the bathroom without difficulty.  

## 2018-09-09 NOTE — Discharge Instructions (Addendum)
You have been diagnosed today with Difficulty Swallowing.  At this time there does not appear to be the presence of an emergent medical condition, however there is always the potential for conditions to change. Please read and follow the below instructions.  Please return to the Emergency Department immediately for any new or worsening symptoms. Please be sure to follow up with your Primary Care Provider within one week regarding your visit today; please call their office to schedule an appointment even if you are feeling better for a follow-up visit. Please call the gastroenterologist office Dr. Carlean Purl tomorrow to schedule a follow-up appointment regarding your symptoms. Your blood pressure was elevated in the emergency department today, please take your home medications as prescribed, please drink plenty of water to avoid dehydration.  Please call your primary care doctor's office to schedule a follow-up appointment for blood pressure recheck and medication management within 1 week.  Get help right away if: You cannot swallow your saliva. You have shortness of breath or a fever, or both. You have a hoarse voice and also have trouble swallowing. Get a very bad headache. Start to feel mixed up (confused). Feel weak or numb. Feel faint. Have very bad pain in your: Chest. Belly (abdomen). Throw up more than once. Have trouble breathing. You have fever or chills. Any new/concerning or worsening symptoms  Please read the additional information packets attached to your discharge summary.  Do not take your medicine if  develop an itchy rash, swelling in your mouth or lips, or difficulty breathing; call 911 and seek immediate emergency medical attention if this occurs.

## 2018-09-09 NOTE — ED Notes (Signed)
Pt verbalized understanding of discharge instructions and denies any further questions at this time.   

## 2018-09-09 NOTE — ED Provider Notes (Addendum)
[facility] EMERGENCY DEPARTMENT Provider Note   CSN: [id number] Arrival date & time: 09/09/18  1054    History   Chief Complaint Chief Complaint  Patient presents with  . Dysphagia    HPI Savannah Diaz is a 85 y.o. female with history of GERD, dysphasia, hypertension, migraines, anxiety, CKD presents today for dysphasia.  Patient reports that yesterday 09/08/2026 she was eating lunch and felt a feeling of food being stuck in her throat, reports that this lasted for approximately 10-15 minutes before resolving.  Patient reports that she has had similar feeling in the past and has been seen in the emergency department multiple times for this.  Patient reports that since her episode yesterday in the lunch she has been unable to drink or eat and reports that each time she spits her food and water back up.  She denies any pain associated with her symptoms.  She reports that she is otherwise feeling well today.  Denies fever/chills, headache/vision changes, chest pain/shortness of breath, cough, abdominal pain, nausea/diarrhea, dysuria/hematuria or any additional concerns.  Patient does report anxiety today and is requesting medication as she cannot take her home medications.  Most recent pertinent studies found:   EGD performed 05/10/2012: Shows abnormal mucosa in the upper third of the esophagus questionable pill induced injury, small hiatal hernia and normal appearing mucosa of the stomach and duodenum.  Barium swallow study performed 05/13/2012: Dysphagia Diagnosis: Moderate pharyngeal phase dysphagia;Moderate cervical esophageal phase dysphagia;Severe cervical esophageal phase dysphagia    HPI  Past Medical History:  Diagnosis Date  . Anxiety   . Arthritis    "qwhere; but it doesn't hurt" (05/12/2012)  . Cataract    left  . Depression   . Dysphagia    "have had it for 7 yr; got worse 4 days ago" (05/12/2012)  . GERD (gastroesophageal reflux disease)   .  Hypertension   . Migraines    "menopause cured them" (05/12/2012)  . Osteoporosis    Patient Active Problem List   Diagnosis Date Noted  . High risk medication use 08/08/2016  . UTI (urinary tract infection) 03/22/2016  . Influenza A 03/22/2016  . Fall 03/21/2016  . 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 Aspiration pneumonia ([place]) 03/21/2016  . Transient alteration of awareness 05/10/2015  . Dehydration with hypernatremia 05/13/2012  . Hypokalemia 05/12/2012  . Dysphagia 05/10/2012  . Hypertension 04/28/2011  . Osteoarthritis 04/28/2011  . Agoraphobia 04/28/2011    Past Surgical History:  Procedure Laterality Date  . CATARACT EXTRACTION W/ INTRAOCULAR LENS IMPLANT Left ? 2010  . ESOPHAGOGASTRODUODENOSCOPY N/A 05/10/2012   Procedure: ESOPHAGOGASTRODUODENOSCOPY (EGD);  Surgeon: Jerene Bears, MD;  Location: [facility];  Service: Gastroenterology;  Laterality: N/A;  . TONSILLECTOMY  1940's     OB History   No obstetric history on file.      Home Medications    Prior to Admission medications   Medication Sig Start Date End Date Taking? Authorizing Provider  amLODipine (NORVASC) 2.5 MG tablet Take 2.5 mg by mouth daily.   Yes [provider]  atenolol (TENORMIN) 50 MG tablet Take 1.5 tablets (75 mg total) by mouth daily. Patient taking differently: Take 50 mg by mouth at bedtime.  04/28/11  Yes Marletta Lor, MD  famotidine (PEPCID) 40 MG tablet Take 40 mg by mouth at bedtime.   Yes [provider]  hydrocortisone cream 1 % Apply 1 application topically daily. For dry skin on sides of mouth  Yes [provider]  LORazepam (ATIVAN) 0.5 MG tablet Take 0.5-1 mg by mouth See admin instructions. Take  0.5 mg or 1 mg  by mouth every morning, 0.5   mid-afternoon and one tablet (0.5 mg) at night   Yes [provider]  mirtazapine (REMERON SOL-TAB) 15 MG disintegrating tablet Take 15 mg by mouth at bedtime.  09/13/17  Yes [provider]  nitroGLYCERIN (NITROSTAT) 0.4 MG SL tablet Place 0.4 mg under the tongue every 5 (five) minutes as needed for chest pain.   Yes [provider]  nortriptyline (PAMELOR) 10 MG/5ML solution Take 25 mLs by mouth at bedtime. 06/24/18  Yes [provider]  traZODone (DESYREL) 50 MG tablet Take 175 mg by mouth at bedtime.   Yes [provider]  LORazepam (ATIVAN) 0.5 MG tablet Take 0.5-1 mg by mouth See admin instructions. Take 1 tablet (0.5mg ) in the morning, 2 tablets (1mg ) in the afternoon, and 2 tablets (1mg ) at night    [provider]  promethazine (PHENERGAN) 25 MG tablet Take 1 tablet (25 mg total) by mouth every 6 (six) hours as needed for nausea or vomiting. Patient not taking: Reported on 09/09/2018 04/01/18   Alveria Apley PA-C    Family History Family History  Problem Relation Age of Onset  . Arthritis Mother   . Hypertension Mother   . Dementia Mother   . Cancer Father        colon     Social History Social History   Tobacco Use  . Smoking status: Never Smoker  . Smokeless tobacco: Never Used  Substance Use Topics  . Alcohol use: Not Currently    Comment: 05/12/2012 "have a drink hardly ever anymore"  . Drug use: No     Allergies   Lactose intolerance (gi), Lactulose, Sulfa antibiotics, and Sulfa antibiotics   Review of Systems Review of Systems Ten systems are reviewed and are negative for acute change except as noted in the HPI   Physical Exam Updated Vital Signs BP (!) 160/110 (BP Location: Right Arm)   Pulse 89   Temp 98.2 F (36.8 C) (Oral)   Resp 16   Ht 5' (1.524 m)   Wt 48.1 kg   SpO2 99%   BMI 20.70 kg/m   Physical Exam Constitutional:      General: She is not in acute distress.    Appearance: Normal appearance. She is well-developed. She is not ill-appearing or diaphoretic.  HENT:     Head: Normocephalic and atraumatic.     Jaw: There is normal jaw occlusion. No trismus.     Right Ear:  External ear normal.     Left Ear: External ear normal.     Nose: Nose normal.     Mouth/Throat:     Mouth: Mucous membranes are moist.     Pharynx: Oropharynx is clear.  Eyes:     General: Vision grossly intact. Gaze aligned appropriately.     Conjunctiva/sclera: Conjunctivae normal.     Pupils: Pupils are equal, round, and reactive to light.  Neck:     Musculoskeletal: Normal range of motion.     Trachea: Trachea and phonation normal. No tracheal tenderness or tracheal deviation.  Cardiovascular:     Rate and Rhythm: Regular rhythm. Tachycardia present.     Pulses:          Dorsalis pedis pulses are 2+ on the right side and 2+ on the left side.     Heart  sounds: Normal heart sounds.  Pulmonary:     Effort: Pulmonary effort is normal. No accessory muscle usage or respiratory distress.     Breath sounds: Normal breath sounds and air entry.  Abdominal:     General: There is no distension.     Palpations: Abdomen is soft.     Tenderness: There is no abdominal tenderness. There is no guarding or rebound.  Musculoskeletal: Normal range of motion.  Skin:    General: Skin is warm and dry.  Neurological:     Mental Status: She is alert.     GCS: GCS eye subscore is 4. GCS verbal subscore is 5. GCS motor subscore is 6.     Comments: Speech is clear and goal oriented, follows commands Major Cranial nerves without deficit, no facial droop Moves extremities without ataxia, coordination intact  Psychiatric:        Behavior: Behavior normal.    ED Treatments / Results  Labs (all labs ordered are listed, but only abnormal results are displayed) Labs Reviewed  BASIC METABOLIC PANEL - Abnormal; Notable for the following components:      Result Value   Potassium 3.4 (*)    Glucose, Bld 120 (*)    GFR calc non Af Amer 55 (*)    All other components within normal limits  HEPATIC FUNCTION PANEL - Abnormal; Notable for the following components:   Total Protein 6.4 (*)    Total Bilirubin  1.9 (*)    Bilirubin, Direct 0.3 (*)    Indirect Bilirubin 1.6 (*)    All other components within normal limits  SARS CORONAVIRUS 2 (HOSPITAL ORDER, PERFORMED IN [facility])  CBC WITH DIFFERENTIAL/PLATELET  LIPASE, BLOOD    EKG EKG Interpretation  Date/Time:  Thursday September 09 2018 12:41:27 EDT Ventricular Rate:  104 PR Interval:    QRS Duration: 90 QT Interval:  354 QTC Calculation: 466 R Axis:   19 Text Interpretation:  Sinus tachycardia Borderline repolarization abnormality Baseline wander in lead(s) V6 Confirmed by Virgel Manifold [phone]) on 09/09/2018 12:57:44 PM   Radiology No results found.  Procedures Procedures (including critical care time)  Medications Ordered in ED Medications  sodium chloride 0.9 % bolus 500 mL (0 mLs Intravenous Stopped 09/09/18 1520)  sodium chloride 0.9 % bolus 500 mL (500 mLs Intravenous New Bag/Given 09/09/18 1538)  LORazepam (ATIVAN) injection 0.5 mg (0.5 mg Intravenous Given 09/09/18 1547)  glucagon (human recombinant) (GLUCAGEN) injection 1 mg (1 mg Intravenous Given 09/09/18 1545)     Initial Impression / Assessment and Plan / ED Course  I have reviewed the triage vital signs and the nursing notes.  Pertinent labs & imaging results that were available during my care of the patient were reviewed by me and considered in my medical decision making (see chart for details).    85 year old tachycardic female with dysphasia since yesterday, no pain on arrival, frail, somewhat dehydrated appearing and in no acute distress.  Currently denies feeling of food bolus, no imaging in the last 5 years found on chart review. Fluid bolus, basic blood work, EKG and barium swallow study ordered.  Case discussed with Dr. Wilson Singer agrees with work-up above. - CBC within normal limits BMP nonacute LFTs with elevated bilirubin, no abdominal pain or jaundice on examination Lipase within normal limits COVID-19 pending EKG: Sinus tachycardia  Borderline repolarization abnormality Baseline wander in lead(s) V6 Confirmed by Virgel Manifold [phone]) on 09/09/2018 12:57:44 PM - Discussed case with on-call gastroenterology advises cancellation of  currently ordered barium swallow study, gastroenterology to see patient in ER, asked for hospitalist involvement for admission. - Patient reassessed resting comfortably and in no acute distress tolerating her secretions without difficulty. States understanding of care plan is agreeable for admission. - Discussed case with hospitalist who has asked for addition of CXR. - Patient seen and evaluated by gastroenterology, Dr. Carlean Purl.  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 - Patient reassessed resting comfortably in no acute distress states understanding of care plan and is agreeable for discharge. Ambulatory without difficulty.  COVID-19 negative  Patient had to be hypertensive here in the emergency department likely secondary to not taking her home medications as she has been unable to swallow for the past day, she is asymptomatic regarding her hypertension I advised her to take her blood pressure medications when she returns home and discussed immediate return to the ER if development of symptoms of hypertensive urgency/emergency.  Additionally suspect patient's tachycardia likely secondary to dehydration and anxiety, she is without chest pain or shortness of breath or infectious-like symptoms she has been given a liter fluid bolus here in the ER and now that she is tolerating p.o. without difficulty I have advised her to continue water rehydration.  At this time there does not appear to be any evidence of an acute emergency medical condition and the patient appears stable for discharge with appropriate outpatient follow up. Diagnosis was discussed  with patient who verbalizes understanding of care plan and is agreeable to discharge. I have discussed return precautions with patient who verbalizes understanding of return precautions. Patient encouraged to follow-up with their PCP. All questions answered.  Patient's case discussed with Dr. Wilson Singer who agrees with plan to discharge with follow-up.   Note: Portions of this report may have been transcribed using voice recognition software. Every effort was made to ensure accuracy; however, inadvertent computerized transcription errors may still be present. Final Clinical Impressions(s) / ED Diagnoses   Final diagnoses:  Dysphagia, unspecified type  Elevated blood pressure reading    ED Discharge Orders    None       Gari Crown 09/09/18 1726    Deliah Boston, PA-C 09/09/18 1727    Gari Crown 09/09/18 1749    Virgel Manifold, MD 09/10/18 1104

## 2018-09-09 NOTE — ED Triage Notes (Signed)
Pt coming from home today. Pt states that starting around lunch time yesterday she began having difficulty swallowing which has been a problem for her in the past. Pt states that sypmtoms have resolved but is afraid of being dehydrated.

## 2018-09-09 NOTE — Consult Note (Addendum)
[facility] Consult: 2:54 PM 09/09/2018  LOS: 0 days    Referring Provider: PA Savannah Diaz in ED Primary Care Physician:  Patient, No Pcp Per Primary Gastroenterologist: Savannah Diaz in 2014, not since.    Reason for Consultation:  Acute dysphagia.     HPI: Savannah Diaz is a 85 y.o. female.    Many years hx of dysphagia.   04/2012 EGD to evaluate complaint of dysphagia.  Abnormal mucosa in upper third of esophagus, ?  Hill induced injury.  Small hiatal hernia.  Normal stomach, bulb, first and second duodenum. 04/2012 barium esophagram.  Abnormal motion of the epiglottis associated with recurrent contrast aspiration.  Retention of secretions and barium in the vallecula and piriform sinuses do not clear with repeated swallowing.  Spillover from piriform sinuses into trachea.  Normal esophagus. 06/2016 MBSS, referral for chronic dysphagia.  Showed mild oral pharyngeal dysphasia, suspected primary cervical esophageal dysphagia.  No aspiration/penetration.  Epiglottic deflection compromised, contributing to stasis.  Patient able to sense residual and clear with reflexive dry swallow.  Decreased UES opening especially with thicker consistencies.  Patient unable to orally transit a whole or even half a tablet despite multiple attempts and boluses of pudding, thins.  Tablet remained in the anterior oral cavity which the patient spit out. SLP advised her to begin meals with liquids to alleviate xerostomia, follow solids with liquids and continue with multiple swallows.  Having trouble swallowing since lunchtime yesterday.   She had some crying chicken and felt slight discomfort and impediment of subsequent food at the level of her upper esophagus.  She waited a few hours.  She tried to swallow water unsuccessfully.  A few hours  later she still could not swallow water.  So far today she has not tried to swallow anything.  She did take 1 of her blood pressure medicines after crushing it she licked it and it seemed to pass okay.  She has a sense that the left side of her neck, upper esophagus is spasming.  In the ED she is tachycardic in the 1 teens.  When I saw her blood pressure was 158/116.  She was not able to take her blood pressure medications last night.  The last time she had acute dysphagia and she was seen in the ED was on 04/01/2018.  The acute swallowing problems improved after IV doses of glucagon, Ativan.  Oral Maalox and viscous lidocaine. Acid suppression at home consists of Pepcid at at bedtime.    Past Medical History:  Diagnosis Date  . Anxiety   . Arthritis    "qwhere; but it doesn't hurt" (05/12/2012)  . Cataract    left  . Depression   . Dysphagia    "have had it for 7 yr; got worse 4 days ago" (05/12/2012)  . GERD (gastroesophageal reflux disease)   . Hypertension   . Migraines    "menopause cured them" (05/12/2012)  . Osteoporosis     Past Surgical History:  Procedure Laterality Date  . CATARACT EXTRACTION W/ INTRAOCULAR LENS IMPLANT Left ? 2010  .  ESOPHAGOGASTRODUODENOSCOPY N/A 05/10/2012   Procedure: ESOPHAGOGASTRODUODENOSCOPY (EGD);  Surgeon: Savannah Bears, MD;  Location: [facility];  Service: Gastroenterology;  Laterality: N/A;  . TONSILLECTOMY  1940's    Prior to Admission medications   Medication Sig Start Date End Date Taking? Authorizing Provider  amLODipine (NORVASC) 2.5 MG tablet Take 2.5 mg by mouth daily.   Yes [provider]  atenolol (TENORMIN) 50 MG tablet Take 1.5 tablets (75 mg total) by mouth daily. Patient taking differently: Take 50 mg by mouth at bedtime.  04/28/11  Yes Savannah Lor, MD  famotidine (PEPCID) 40 MG tablet Take 40 mg by mouth at bedtime.   Yes [provider]  hydrocortisone cream 1 % Apply 1 application topically daily. For dry  skin on sides of mouth   Yes [provider]  LORazepam (ATIVAN) 0.5 MG tablet Take 0.5-1 mg by mouth See admin instructions. Take  0.5 mg or 1 mg  by mouth every morning, 0.5   mid-afternoon and one tablet (0.5 mg) at night   Yes [provider]  mirtazapine (REMERON SOL-TAB) 15 MG disintegrating tablet Take 15 mg by mouth at bedtime.  09/13/17  Yes [provider]  nitroGLYCERIN (NITROSTAT) 0.4 MG SL tablet Place 0.4 mg under the tongue every 5 (five) minutes as needed for chest pain.   Yes [provider]  nortriptyline (PAMELOR) 10 MG/5ML solution Take 25 mLs by mouth at bedtime. 06/24/18  Yes [provider]  traZODone (DESYREL) 50 MG tablet Take 175 mg by mouth at bedtime.   Yes [provider]  LORazepam (ATIVAN) 0.5 MG tablet Take 0.5-1 mg by mouth See admin instructions. Take 1 tablet (0.5mg ) in the morning, 2 tablets (1mg ) in the afternoon, and 2 tablets (1mg ) at night    [provider]  promethazine (PHENERGAN) 25 MG tablet Take 1 tablet (25 mg total) by mouth every 6 (six) hours as needed for nausea or vomiting. Patient not taking: Reported on 09/09/2018 04/01/18   Savannah Apley, PA-C    Scheduled Meds:  Infusions: . sodium chloride     PRN Meds:    Allergies as of 09/09/2018 - Review Complete 09/09/2018  Allergen Reaction Noted  . Lactose intolerance (gi) Other (See Comments) 05/14/2012  . Lactulose Other (See Comments) 05/14/2012  . Sulfa antibiotics  04/01/2018  . Sulfa antibiotics Hives 05/10/2012    Family History  Problem Relation Age of Onset  . Arthritis Mother   . Hypertension Mother   . Dementia Mother   . Cancer Father        colon     Social History   Social History Narrative   Widowed, retired, 2 daughters   Lives at [employer] [facility]   No Etoh/tobacco         Roff: Constitutional: No weakness, no fatigue. ENT:  No nose bleeds Pulm: Cough, no shortness of breath. CV:   No palpitations, no LE edema.  Chest pain. GU:  No hematuria, no frequency GI: See HPI Heme: No unusual bleeding or bruising Transfusions: None Neuro:  No headaches, no peripheral tingling or numbness.  No dizziness, no syncope, no seizures. Derm:  No itching, no rash or sores.  Endocrine:  No sweats or chills.  No polyuria or dysuria Immunization: Viewed. Travel: in [place] isolation at [place] for the past several months   PHYSICAL EXAM: Vital signs in last 24 hours: Vitals:   09/09/18 1245 09/09/18 1300  BP: (!) 162/103 Marland Kitchen)  148/96  Pulse: (!) 103 (!) 105  Resp: 13 (!) 33  Temp:    SpO2: 100% 98%   Wt Readings from Last 3 Encounters:  09/09/18 48.1 kg  10/01/17 49 kg  08/08/16 53.9 kg    General: Pleasant, petite, elderly but not frail-appearing WF sitting in bed comfortable. Head: No facial asymmetry or swelling.  No signs of head trauma. Eyes: Scleral icterus.  No conjunctival pallor.  EOMI Ears: Not hard of hearing Nose: No congestion or discharge Mouth: Oral mucosa pink, moist, clear.  Tongue midline. Neck: No JVD, no masses, no thyromegaly. Lungs: Clear bilaterally.  No labored breathing.  No cough. Heart: Regular, tacky at 113.  No MRG.  S1, S2 present. Abdomen: Not tender.  Not distended.  Active bowel sounds.  No HSM, masses, bruits, hernias..   Rectal: Deferred Musc/Skeltl: Slight kyphosis.  Somewhat osteoporotic skeletal appearance.   Extremities: No CCE. Neurologic: Oriented x3.  Alert.  Good historian.  Moves all 4 limbs without tremor.  Strength not tested. Skin: No rash, no sores, no telangiectasia. Tattoos: None Nodes: No cervical adenopathy. Psych: Pleasant.  Cooperative.  Little bit anxious.  Fluid speech  Intake/Output from previous day: No intake/output data recorded. Intake/Output this shift: No intake/output data recorded.  LAB RESULTS: Recent Labs    09/09/18 1114  WBC 8.7  HGB 14.3  HCT 43.3  PLT 252   BMET Lab Results   Component Value Date   NA 142 09/09/2018   NA 144 04/01/2018   NA 143 01/22/2018   K 3.4 (L) 09/09/2018   K 3.3 (L) 04/01/2018   K 3.0 (L) 01/22/2018   CL 107 09/09/2018   CL 102 04/01/2018   CL 105 01/22/2018   CO2 22 09/09/2018   CO2 28 04/01/2018   CO2 28 01/22/2018   GLUCOSE 120 (H) 09/09/2018   GLUCOSE 119 (H) 04/01/2018   GLUCOSE 103 (H) 01/22/2018   BUN 14 09/09/2018   BUN 8 04/01/2018   BUN 11 01/22/2018   CREATININE 0.96 09/09/2018   CREATININE 0.97 04/01/2018   CREATININE 1.09 (H) 01/22/2018   CALCIUM 9.2 09/09/2018   CALCIUM 9.0 04/01/2018   CALCIUM 9.5 01/22/2018   LFT Recent Labs    09/09/18 1114  PROT 6.4*  ALBUMIN 3.9  AST 16  ALT 21  ALKPHOS 42  BILITOT 1.9*  BILIDIR 0.3*  IBILI 1.6*   PT/INR No results found for: INR, PROTIME  Lipase     Component Value Date/Time   LIPASE 22 09/09/2018 1114      IMPRESSION:   *      Dysphagia.  Acute on chronic.  History cervical level esophageal dysphagia.  *    Hypokalemia.    PLAN:     *    Ordered 1 dose of glucagon 1 mg IV.  Asked nurse to let the patient try swallowing water about 10 minutes after this medication is given. If she regains her ability to swallow she does not need to come into the hospital but if she cannot swallow she needs to come in so that her blood pressure can be monitored and treated with non-oral medications.  *   If her dysphagia persists, not sure if the next best study would be endoscopy, tomorrow or barium esophagram.   Azucena Freed  09/09/2018, 2:54 PM Phone [phone]      [place] Attending   I have taken an interval history, reviewed the chart and examined the patient. I agree  with the Advanced Practitioner's note, impression and recommendations.   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

## 2018-10-18 ENCOUNTER — Encounter: Payer: Self-pay | Admitting: Internal Medicine

## 2018-10-18 ENCOUNTER — Ambulatory Visit (INDEPENDENT_AMBULATORY_CARE_PROVIDER_SITE_OTHER): Payer: Medicare Other | Admitting: Internal Medicine

## 2018-10-18 DIAGNOSIS — R1314 Dysphagia, pharyngoesophageal phase: Secondary | ICD-10-CM

## 2018-10-18 NOTE — Progress Notes (Signed)
Savannah Diaz 85 y.o. May 28, 1932 [id number]  Assessment & Plan:   Dysphagia, pharyngoesophageal phase - suspected Long history of dysphagia going back since about 2005.   EGD in March 2014 with some presumed coating of the upper esophagus small hiatal hernia otherwise normal. Myasthenia and other neuromuscular work-up negative with serologies then Speech path eval then demonstrated upper esophageal sphincter impaired relaxation and question of neurologic or neuromuscular issue which led to the negative work-up above.  Problems in the pharyngeal and upper esophageal area Barium swallow showed abnormal epiglottis movement and aspiration May 2014 ENT evaluation Dr. Radene Journey no records 06/2016 MBSS, referral for chronic dysphagia.  Showed mild oral pharyngeal dysphasia, suspected primary cervical esophageal dysphagia.  No aspiration/penetration.  Epiglottic deflection compromised, contributing to stasis.  Patient able to sense residual and clear with reflexive dry swallow.  Decreased UES opening especially with thicker consistencies.  Patient unable to orally transit a whole or even half a tablet despite multiple attempts and boluses of pudding, thins.  Tablet remained in the anterior oral cavity which the patient spit out. SLP advised her to begin meals with liquids to alleviate xerostomia, follow solids with liquids and continue with multiple swallows.  Given her persistent intermittent problems I think it is worth evaluating further.  Will perform an esophageal manometry to try to understand this better and to see if there is definitive treatment.  Overlying anxiety is probably contributing but not underlying etiology..  She seems better than she was several years ago when she had aspiration pneumonia and was hospitalized with serious illness and she recognizes the issues and stops eating and probably prevents aspiration. Evaluate with esophageal manometry      Subjective:   Chief  Complaint: Dysphagia  HPI The patient is here for follow-up after I had seen her in the hospital emergency department the other month because of acute impact dysphagia or symptoms and signs suggestive of that.  Since about 2005 she has had intermittent problems where she gets a tightness and spasm sensation and feels like something is stuck in her right neck.  She does not have to be eating without necessarily.  She has been in and out of the hospital, in 2014 she had an aspiration pneumonia related to this.  See my consult note of September 09, 2018 for other details as well as the assessment and plan today.  She has not had another episode.  Her daughter is on the phone during the interview today.  Her daughter reports that sometimes anxiety can make the episodes worse and the patient agrees.  The patient is not interested in very aggressive treatment if we can avoid that.  She is concerned about complications of procedures.  She would like to prevent this from happening in the future though.  She said that over the past several years she has had increased welling up of mucus and inability to clear that.  Then she will get these episodes that just occur randomly and can last for hours to a couple of days and eventually lead to a need to go to the emergency department.  She has received glucagon but she has felt like it probably did not work although I think that is hard to say it may have helped but she has had spontaneous resolution of these problems since 2014 though it can take a while.  6 ER visits since 2016 after that initial admission in 2014. Allergies  Allergen Reactions  . Lactose Intolerance (Gi) Other (  See Comments)    Gi tract issues  . Lactulose Other (See Comments)    Gi tract issues  . Sulfa Antibiotics     Childhood allergy  . Sulfa Antibiotics Hives    Childhood allergy    Current Meds  Medication Sig  . amLODipine (NORVASC) 2.5 MG tablet Take 2.5 mg by mouth daily.  Marland Kitchen atenolol  (TENORMIN) 50 MG tablet Take 1.5 tablets (75 mg total) by mouth daily. (Patient taking differently: Take 50 mg by mouth at bedtime. )  . famotidine (PEPCID) 40 MG tablet Take 40 mg by mouth at bedtime.  . hydrocortisone cream 1 % Apply 1 application topically daily. For dry skin on sides of mouth  . LORazepam (ATIVAN) 0.5 MG tablet Take 0.5-1 mg by mouth See admin instructions. Take 1 tablet (0.5mg ) in the morning, 2 tablets (1mg ) in the afternoon, and 2 tablets (1mg ) at night  . mirtazapine (REMERON SOL-TAB) 15 MG disintegrating tablet Take 15 mg by mouth at bedtime.   . nortriptyline (PAMELOR) 10 MG/5ML solution Take 25 mLs by mouth at bedtime.  . promethazine (PHENERGAN) 25 MG tablet Take 1 tablet (25 mg total) by mouth every 6 (six) hours as needed for nausea or vomiting.  . traZODone (DESYREL) 50 MG tablet Take 175 mg by mouth at bedtime.   Past Medical History:  Diagnosis Date  . Anxiety   . Arthritis    "qwhere; but it doesn't hurt" (05/12/2012)  . Cataract    left  . Depression   . Dysphagia    "have had it for 7 yr; got worse 4 days ago" (05/12/2012)  . GERD (gastroesophageal reflux disease)   . Hypertension   . Migraines    "menopause cured them" (05/12/2012)  . Osteoporosis    Past Surgical History:  Procedure Laterality Date  . CATARACT EXTRACTION W/ INTRAOCULAR LENS IMPLANT Left ? 2010  . ESOPHAGOGASTRODUODENOSCOPY N/A 05/10/2012   Procedure: ESOPHAGOGASTRODUODENOSCOPY (EGD);  Surgeon: Jerene Bears, MD;  Location: [facility];  Service: Gastroenterology;  Laterality: N/A;  . TONSILLECTOMY  1940's   Social History   Social History Narrative   Widowed, retired, 2 daughters   Lives at [employer]   No Etoh/tobacco       family history includes Arthritis in her mother; Colon cancer in her father; Dementia in her mother; Hypertension in her mother.   Review of Systems As per HPI, she is treated for depression  Objective:   Physical Exam BP (!) 140/94 (BP  Location: Left Arm, Patient Position: Sitting, Cuff Size: Normal)   Pulse 100   Temp 98.4 F (36.9 C)   Ht 5' (1.524 m) Comment: height measured without shoes  Wt 110 lb (49.9 kg)   BMI 21.48 kg/m  NAD WDWN petite elderly ww Appropriate mood and affect   15 minutes time spent with patient > half in counseling coordination of care

## 2018-10-18 NOTE — Patient Instructions (Addendum)
You have been scheduled for an Esophageal Manometry and 24 Hour pH study at [facility] on 11/08/2018 at 12:30 pm. Please arrive 30 minutes prior to your procedure for registration. You will need to go to outpatient registration (1st floor of the hospital) first. Make certain to bring your insurance cards as well as a complete list of medications.  Please remember the following:  1) Do not take any muscle relaxants, xanax (alprazolam) or ativan for 1 day prior to your test as well as the day of the test.  2) Nothing to eat or drink after 12:00 midnight on the night before your test.  3) Hold all diabetic medications/insulin the morning of the test. You may eat and take your medications after the test.  4) For 7 days prior to your test, do not take: Reglan, Tagamet, Zantac, Phenergan, Axid or Pepcid.  5) You MAY use an antacid such as Rolaids or Tums up to 12 hours prior to your test.     ------------------------------------------------------------------------------------------- ABOUT ESOPHAGEAL MANOMETRY Esophageal manometry (muh-NOM-uh-tree) is a test that gauges how well your esophagus works. Your esophagus is the long, muscular tube that connects your throat to your stomach. Esophageal manometry measures the rhythmic muscle contractions (peristalsis) that occur in your esophagus when you swallow. Esophageal manometry also measures the coordination and force exerted by the muscles of your esophagus.  During esophageal manometry, a thin, flexible tube (catheter) that contains sensors is passed through your nose, down your esophagus and into your stomach. Esophageal manometry can be helpful in diagnosing some mostly uncommon disorders that affect your esophagus.  Why it's done Esophageal manometry is used to evaluate the movement (motility) of food through the esophagus and into the stomach. The test measures how well the circular bands of muscle (sphincters) at the top and bottom  of your esophagus open and close, as well as the pressure, strength and pattern of the wave of esophageal muscle contractions that moves food along.  What you can expect Esophageal manometry is an outpatient procedure done without sedation. Most people tolerate it well. You may be asked to change into a hospital gown before the test starts.  During esophageal manometry  . While you are sitting up, a member of your health care team sprays your throat with a numbing medication or puts numbing gel in your nose or both.  . A catheter is guided through your nose into your esophagus. The catheter may be sheathed in a water-filled sleeve. It doesn't interfere with your breathing. However, your eyes may water, and you may gag. You may have a slight nosebleed from irritation.  . After the catheter is in place, you may be asked to lie on your back on an exam table, or you may be asked to remain seated.  . You then swallow small sips of water. As you do, a computer connected to the catheter records the pressure, strength and pattern of your esophageal muscle contractions.  . During the test, you'll be asked to breathe slowly and smoothly, remain as still as possible, and swallow only when you're asked to do so.  . A member of your health care team may move the catheter down into your stomach while the catheter continues its measurements.  . The catheter then is slowly withdrawn.  This test typically takes 30-45 minutes to complete.  ---------------------------------------------------------------------------------------------- ABOUT 24 HOUR PH PROBE An esophageal pH test measures and records the pH in your esophagus to determine if you have gastroesophageal  reflux disease (GERD). The test can also be done to determine the effectiveness of medications or surgical treatment for GERD. What is esophageal reflux? Esophageal reflux is a condition in which stomach acid refluxes or moves back into the esophagus (the  "food pipe" leading from the mouth to the stomach). How does the esophageal pH test work? A thin, small tube with an acid sensing device on the tip is gently passed through your nose, down the esophagus ("food tube"), and positioned about 2 inches above the lower esophageal sphincter. The tube is secured to the side of your face with clear tape. The end of the tube exiting from your nose is attached to a portable recorder that is worn on your belt or over your shoulder. The recorder has several buttons on it that you will press to mark certain events. A nurse will review the monitoring instructions with you. Once the test has begun, what do I need to know and do? Marland Kitchen Activity: Follow your usual daily routine. Do not reduce or change your activities during the monitoring period. Doing so can make the monitoring results less useful.  . Note: do not take a tub bath or shower; the equipment can't get wet.  . Eating: Eat your regular meals at the usual times. If you do not eat during the monitoring period, your stomach will not produce acid as usual, and the test results will not be accurate. Eat at least 2 meals a day. Eat foods that tend to increase your symptoms (without making yourself miserable). Avoid snacking. Do not suck on hard candy or lozenges and do not chew gum during the monitoring period.  . Lying down: Remain upright throughout the day. Do not lie down until you go to bed (unless napping or lying down during the day is part of your daily routine).  . Medications: Continue to follow your doctor's advice regarding medications to avoid during the monitoring period.  . Recording symptoms: Press the appropriate button on your recorder when symptoms occur (as discussed with the nurse).  . Recording events: Record the time you start and stop eating and drinking (anything other than plain water). Record the time you lie down (even if just resting) and when you get back up. The nurse will explain this.   . Unusual symptoms or side effects. If you think you may be experiencing any unusual symptoms or side effects, call your doctor.  You will return the next day to have the tube removed. The information on the recorder will be downloaded to a computer and the results will be analyzed.  After completion of the study Resume your normal diet and medications. Lozenges or hard candy may help ease any sore throat caused by the tube.   It will take at least 2 weeks to receive the results of this test from your physician. Appointment made for 11/23/2018 at 9:50AM   I appreciate the opportunity to care for you. Silvano Rusk, MD, [facility]

## 2018-10-18 NOTE — Assessment & Plan Note (Addendum)
Long history of dysphagia going back since about 2005.   EGD in March 2014 with some presumed coating of the upper esophagus small hiatal hernia otherwise normal. Myasthenia and other neuromuscular work-up negative with serologies then Speech path eval then demonstrated upper esophageal sphincter impaired relaxation and question of neurologic or neuromuscular issue which led to the negative work-up above.  Problems in the pharyngeal and upper esophageal area Barium swallow showed abnormal epiglottis movement and aspiration May 2014 ENT evaluation Dr. Radene Journey no records 06/2016 MBSS, referral for chronic dysphagia.  Showed mild oral pharyngeal dysphasia, suspected primary cervical esophageal dysphagia.  No aspiration/penetration.  Epiglottic deflection compromised, contributing to stasis.  Patient able to sense residual and clear with reflexive dry swallow.  Decreased UES opening especially with thicker consistencies.  Patient unable to orally transit a whole or even half a tablet despite multiple attempts and boluses of pudding, thins.  Tablet remained in the anterior oral cavity which the patient spit out. SLP advised her to begin meals with liquids to alleviate xerostomia, follow solids with liquids and continue with multiple swallows.  Given her persistent intermittent problems I think it is worth evaluating further.  Will perform an esophageal manometry to try to understand this better and to see if there is definitive treatment.  Overlying anxiety is probably contributing but not underlying etiology..  She seems better than she was several years ago when she had aspiration pneumonia and was hospitalized with serious illness and she recognizes the issues and stops eating and probably prevents aspiration. Evaluate with esophageal manometry

## 2018-10-19 ENCOUNTER — Telehealth: Payer: Self-pay | Admitting: Internal Medicine

## 2018-10-19 NOTE — Telephone Encounter (Signed)
All questions about medications answered.  She will call back for any additional questions or concerns.

## 2018-10-19 NOTE — Telephone Encounter (Signed)
Pt is scheduled for procedure at hospital 11/08/18 and requested a call to discuss prep.

## 2018-11-04 ENCOUNTER — Other Ambulatory Visit (HOSPITAL_COMMUNITY)
Admission: RE | Admit: 2018-11-04 | Discharge: 2018-11-04 | Disposition: A | Discharge status: Home / Self Care | Payer: Medicare Other | Source: Ambulatory Visit | Attending: Internal Medicine | Admitting: Internal Medicine

## 2018-11-04 DIAGNOSIS — Z01812 Encounter for preprocedural laboratory examination: Secondary | ICD-10-CM | POA: Diagnosis not present

## 2018-11-04 DIAGNOSIS — Z20828 Contact with and (suspected) exposure to other viral communicable diseases: Secondary | ICD-10-CM | POA: Diagnosis not present

## 2018-11-06 LAB — NOVEL CORONAVIRUS, NAA (HOSP ORDER, SEND-OUT TO REF LAB; TAT 18-24 HRS): SARS-CoV-2, NAA: NOT DETECTED

## 2018-11-08 ENCOUNTER — Ambulatory Visit (HOSPITAL_COMMUNITY)
Admission: RE | Admit: 2018-11-08 | Discharge: 2018-11-08 | Disposition: A | Discharge status: Home / Self Care | Payer: Medicare Other | Attending: Internal Medicine | Admitting: Internal Medicine

## 2018-11-08 ENCOUNTER — Encounter (HOSPITAL_COMMUNITY)
Admission: RE | Disposition: A | Discharge status: Home / Self Care | Payer: Self-pay | Source: Home / Self Care | Attending: Internal Medicine

## 2018-11-08 DIAGNOSIS — R1310 Dysphagia, unspecified: Secondary | ICD-10-CM

## 2018-11-08 HISTORY — PX: ESOPHAGEAL MANOMETRY: SHX5429

## 2018-11-08 SURGERY — MANOMETRY, ESOPHAGUS

## 2018-11-08 MED ORDER — LIDOCAINE VISCOUS HCL 2 % MT SOLN
OROMUCOSAL | Status: AC
  Filled 2018-11-08: qty 15

## 2018-11-08 SURGICAL SUPPLY — 2 items
FACESHIELD LNG OPTICON STERILE (SAFETY) IMPLANT
GLOVE BIO SURGEON STRL SZ8 (GLOVE) ×4 IMPLANT

## 2018-11-08 NOTE — Progress Notes (Signed)
Esophageal manometry performed per protocol without complications.  Patient tolerated well. 

## 2018-11-09 ENCOUNTER — Encounter (HOSPITAL_COMMUNITY): Payer: Self-pay | Admitting: Internal Medicine

## 2018-11-23 ENCOUNTER — Ambulatory Visit: Payer: Medicare Other | Admitting: Internal Medicine

## 2018-12-27 DIAGNOSIS — G4700 Insomnia, unspecified: Secondary | ICD-10-CM | POA: Diagnosis not present

## 2018-12-27 DIAGNOSIS — Z79899 Other long term (current) drug therapy: Secondary | ICD-10-CM | POA: Diagnosis not present

## 2018-12-27 DIAGNOSIS — I129 Hypertensive chronic kidney disease with stage 1 through stage 4 chronic kidney disease, or unspecified chronic kidney disease: Secondary | ICD-10-CM | POA: Diagnosis not present

## 2018-12-27 DIAGNOSIS — Z1389 Encounter for screening for other disorder: Secondary | ICD-10-CM | POA: Diagnosis not present

## 2018-12-27 DIAGNOSIS — R1310 Dysphagia, unspecified: Secondary | ICD-10-CM | POA: Diagnosis not present

## 2018-12-27 DIAGNOSIS — M810 Age-related osteoporosis without current pathological fracture: Secondary | ICD-10-CM | POA: Diagnosis not present

## 2018-12-27 DIAGNOSIS — K219 Gastro-esophageal reflux disease without esophagitis: Secondary | ICD-10-CM | POA: Diagnosis not present

## 2018-12-27 DIAGNOSIS — Z0000 Encounter for general adult medical examination without abnormal findings: Secondary | ICD-10-CM | POA: Diagnosis not present

## 2018-12-27 DIAGNOSIS — F325 Major depressive disorder, single episode, in full remission: Secondary | ICD-10-CM | POA: Diagnosis not present

## 2018-12-27 DIAGNOSIS — N1831 Chronic kidney disease, stage 3a: Secondary | ICD-10-CM | POA: Diagnosis not present

## 2018-12-27 DIAGNOSIS — F411 Generalized anxiety disorder: Secondary | ICD-10-CM | POA: Diagnosis not present

## 2018-12-27 DIAGNOSIS — Z23 Encounter for immunization: Secondary | ICD-10-CM | POA: Diagnosis not present

## 2018-12-31 ENCOUNTER — Ambulatory Visit (INDEPENDENT_AMBULATORY_CARE_PROVIDER_SITE_OTHER): Payer: Medicare Other | Admitting: Internal Medicine

## 2018-12-31 ENCOUNTER — Encounter: Payer: Self-pay | Admitting: Internal Medicine

## 2018-12-31 VITALS — BP 130/72 | HR 83 | Temp 97.0°F | Ht 60.0 in | Wt 113.0 lb

## 2018-12-31 DIAGNOSIS — R1312 Dysphagia, oropharyngeal phase: Secondary | ICD-10-CM

## 2018-12-31 NOTE — Patient Instructions (Signed)
  We are going to on getting speech therapy set up for you at [facility].   Please follow up with Dr Carlean Purl as needed.    I appreciate the opportunity to care for you. Silvano Rusk, MD, [facility]

## 2018-12-31 NOTE — Assessment & Plan Note (Signed)
Manometry did not shed any light so I think her esophageal work-up is complete.  She is interested in pursuing speech therapy again and I think that is reasonable.  We will see if she can get that done at friend's home [place].  That would be much better for her.

## 2018-12-31 NOTE — Progress Notes (Signed)
Savannah Diaz 85 y.o. 10-30-1932 [id number]  Assessment & Plan:    Dysphagia oropharyngeal and pharyngeal Manometry did not shed any light so I think her esophageal work-up is complete.  She is interested in pursuing speech therapy again and I think that is reasonable.  We will see if she can get that done at friend's home [place].  That would be much better for her.    Subjective:   Chief Complaint: Dysphagia  HPI The patient is here actually not having any swallowing problems.  She has had these episodic problems with obstructive symptoms when swallowing.  She does not feel that food gets stuck.  It is going on for years probably 10 or more.  Previous EGD unrevealing barium swallow demonstrated aspiration issues and she had a modified barium swallow in 2018.  I had her do an esophageal manometry wondering if there might be a cricopharyngeal abnormality related but we could not turn anything up it was all normal.  Lately because of Covid her meals are delivered to her door and they are often cold she says, and on top of that she has had dental issues and really does not have significant lower molars.  So she has been blending her food into a pure and actually doing very well with her swallowing.  She is anticipating having dental work done to where she can have the ability to eat regular food again.   Allergies  Allergen Reactions  . Lactose Intolerance (Gi) Other (See Comments)    Gi tract issues  . Lactulose Other (See Comments)    Gi tract issues  . Sulfa Antibiotics     Childhood allergy  . Sulfa Antibiotics Hives    Childhood allergy    Current Meds  Medication Sig  . amLODipine (NORVASC) 2.5 MG tablet Take 2.5 mg by mouth daily.  Marland Kitchen atenolol (TENORMIN) 50 MG tablet Take 1.5 tablets (75 mg total) by mouth daily. (Patient taking differently: Take 50 mg by mouth at bedtime. )  . famotidine (PEPCID) 40 MG tablet Take 40 mg by mouth at bedtime.  . hydrocortisone cream 1 %  Apply 1 application topically daily. For dry skin on sides of mouth  . LORazepam (ATIVAN) 0.5 MG tablet Take 0.5-1 mg by mouth See admin instructions. Take 1 tablet (0.5mg ) in the morning, 2 tablets (1mg ) in the afternoon, and 2 tablets (1mg ) at night  . mirtazapine (REMERON SOL-TAB) 15 MG disintegrating tablet Take 15 mg by mouth at bedtime.   . nortriptyline (PAMELOR) 10 MG/5ML solution Take 25 mLs by mouth at bedtime.  . traZODone (DESYREL) 50 MG tablet Take 175 mg by mouth at bedtime.   Past Medical History:  Diagnosis Date  . Anxiety   . Arthritis    "qwhere; but it doesn't hurt" (05/12/2012)  . Aspiration pneumonia ([place]) 03/21/2016  . Cataract    left  . Depression   . Dysphagia    "have had it for 7 yr; got worse 4 days ago" (05/12/2012)  . Dysphagia, pharyngoesophageal phase - suspected 05/10/2012  . GERD (gastroesophageal reflux disease)   . Hypertension   . Migraines    "menopause cured them" (05/12/2012)  . Osteoporosis    Past Surgical History:  Procedure Laterality Date  . CATARACT EXTRACTION W/ INTRAOCULAR LENS IMPLANT Left ? 2010  . ESOPHAGEAL MANOMETRY N/A 11/08/2018   Procedure: ESOPHAGEAL MANOMETRY (EM);  Surgeon: Gatha Mayer, MD;  Location: [facility];  Service: Endoscopy;  Laterality: N/A;  . ESOPHAGOGASTRODUODENOSCOPY  N/A 05/10/2012   Procedure: ESOPHAGOGASTRODUODENOSCOPY (EGD);  Surgeon: Jerene Bears, MD;  Location: [facility];  Service: Gastroenterology;  Laterality: N/A;  . TONSILLECTOMY  1940's   Social History   Social History Narrative   Widowed, retired, 2 daughters husband was a Mining engineer at [employer]   Her daughters and son-in-law's are all philosophy professors to it [employer] and [employer], daughter here at [employer] son-in-law at [place] at [employer] [facility]   No Etoh/tobacco       family history includes Arthritis in her mother; Colon cancer in her father; Dementia in her mother; Hypertension in her mother.   Review of  Systems  As per HPI Objective:   Physical Exam BP 130/72   Pulse 83   Temp (!) 97 F (36.1 C)   Ht 5' (1.524 m)   Wt 113 lb (51.3 kg)   SpO2 98%   BMI 22.07 kg/m  Pleasant elderly white woman no acute distress  15 minutes time spent with patient > half in counseling coordination of care

## 2019-01-03 ENCOUNTER — Telehealth: Payer: Self-pay

## 2019-01-03 ENCOUNTER — Telehealth: Payer: Self-pay | Admitting: Internal Medicine

## 2019-01-03 NOTE — Telephone Encounter (Signed)
I called and told her I was able to leave a message for the RN in charge , Rebbeca Paul and hopefully she will call me back and can assist in getting speech therapy set up.

## 2019-01-03 NOTE — Telephone Encounter (Signed)
-----   Message from Gatha Mayer, MD sent at 12/31/2018  6:31 PM EST ----- Regarding: Speech path at friend[facility] home [place] We need to see if we can get her an order for speech pathology there.  We can send the previous notes from me and her manometry report and the modified barium swallow from 2018 notes in the chart to help.  The reason is oropharyngeal and pharyngeal dysphagia reassess and treat

## 2019-01-03 NOTE — Telephone Encounter (Signed)
Pls call pt. She would like to speak with you about her speech therapy.

## 2019-01-04 NOTE — Telephone Encounter (Signed)
Notes faxed to [facility] at fax # [phone]. Will await response. According to Rebbeca Paul this is the in house speech therapy at [facility].

## 2019-01-04 NOTE — Telephone Encounter (Signed)
Rebbeca Paul called back.  Please fax order for speech therapy to [facility] at [phone].

## 2019-01-07 DIAGNOSIS — R1313 Dysphagia, pharyngeal phase: Secondary | ICD-10-CM | POA: Diagnosis not present

## 2019-01-07 DIAGNOSIS — R1314 Dysphagia, pharyngoesophageal phase: Secondary | ICD-10-CM | POA: Diagnosis not present

## 2019-01-07 DIAGNOSIS — R1312 Dysphagia, oropharyngeal phase: Secondary | ICD-10-CM | POA: Diagnosis not present

## 2019-01-10 DIAGNOSIS — R1314 Dysphagia, pharyngoesophageal phase: Secondary | ICD-10-CM | POA: Diagnosis not present

## 2019-01-10 DIAGNOSIS — R1312 Dysphagia, oropharyngeal phase: Secondary | ICD-10-CM | POA: Diagnosis not present

## 2019-01-10 DIAGNOSIS — R1313 Dysphagia, pharyngeal phase: Secondary | ICD-10-CM | POA: Diagnosis not present

## 2019-01-10 NOTE — Telephone Encounter (Signed)
Information has been faxed to [facility] at fax # [phone]. They come to [facility] to work on the patient. The records were faxed over on 01/04/2019  for them to set up the appointment .

## 2019-01-12 NOTE — Telephone Encounter (Signed)
Pt returned your call and said that PT is going to start in a couple of weeks because the physical therapist is going to be out for 2 weeks for the birth of his child

## 2019-01-12 NOTE — Telephone Encounter (Signed)
Fantastic news!

## 2019-01-12 NOTE — Telephone Encounter (Signed)
Left Savannah Diaz a message to call me back , I want to make sure that they reached out to her to start her speech therapy.

## 2019-01-13 NOTE — Telephone Encounter (Signed)
Patient called to let us know they have contacted her and will set up speech therapy.

## 2019-02-07 DIAGNOSIS — R1314 Dysphagia, pharyngoesophageal phase: Secondary | ICD-10-CM | POA: Diagnosis not present

## 2019-02-07 DIAGNOSIS — R1313 Dysphagia, pharyngeal phase: Secondary | ICD-10-CM | POA: Diagnosis not present

## 2019-02-11 DIAGNOSIS — R1313 Dysphagia, pharyngeal phase: Secondary | ICD-10-CM | POA: Diagnosis not present

## 2019-02-11 DIAGNOSIS — R1314 Dysphagia, pharyngoesophageal phase: Secondary | ICD-10-CM | POA: Diagnosis not present

## 2019-02-28 DIAGNOSIS — Z23 Encounter for immunization: Secondary | ICD-10-CM | POA: Diagnosis not present

## 2019-03-28 DIAGNOSIS — Z23 Encounter for immunization: Secondary | ICD-10-CM | POA: Diagnosis not present

## 2019-03-30 DIAGNOSIS — I129 Hypertensive chronic kidney disease with stage 1 through stage 4 chronic kidney disease, or unspecified chronic kidney disease: Secondary | ICD-10-CM | POA: Diagnosis not present

## 2019-03-30 DIAGNOSIS — N1831 Chronic kidney disease, stage 3a: Secondary | ICD-10-CM | POA: Diagnosis not present

## 2019-03-30 DIAGNOSIS — K219 Gastro-esophageal reflux disease without esophagitis: Secondary | ICD-10-CM | POA: Diagnosis not present

## 2019-03-30 DIAGNOSIS — R1310 Dysphagia, unspecified: Secondary | ICD-10-CM | POA: Diagnosis not present

## 2019-07-29 DIAGNOSIS — F325 Major depressive disorder, single episode, in full remission: Secondary | ICD-10-CM | POA: Diagnosis not present

## 2019-07-29 DIAGNOSIS — F411 Generalized anxiety disorder: Secondary | ICD-10-CM | POA: Diagnosis not present

## 2019-07-29 DIAGNOSIS — R1312 Dysphagia, oropharyngeal phase: Secondary | ICD-10-CM | POA: Diagnosis not present

## 2019-07-29 DIAGNOSIS — N1831 Chronic kidney disease, stage 3a: Secondary | ICD-10-CM | POA: Diagnosis not present

## 2019-07-29 DIAGNOSIS — L821 Other seborrheic keratosis: Secondary | ICD-10-CM | POA: Diagnosis not present

## 2019-07-29 DIAGNOSIS — I129 Hypertensive chronic kidney disease with stage 1 through stage 4 chronic kidney disease, or unspecified chronic kidney disease: Secondary | ICD-10-CM | POA: Diagnosis not present

## 2019-08-02 DIAGNOSIS — R1312 Dysphagia, oropharyngeal phase: Secondary | ICD-10-CM | POA: Diagnosis not present

## 2019-08-04 DIAGNOSIS — R1312 Dysphagia, oropharyngeal phase: Secondary | ICD-10-CM | POA: Diagnosis not present

## 2019-08-09 DIAGNOSIS — R1312 Dysphagia, oropharyngeal phase: Secondary | ICD-10-CM | POA: Diagnosis not present

## 2019-08-11 DIAGNOSIS — R1312 Dysphagia, oropharyngeal phase: Secondary | ICD-10-CM | POA: Diagnosis not present

## 2019-08-18 DIAGNOSIS — R1312 Dysphagia, oropharyngeal phase: Secondary | ICD-10-CM | POA: Diagnosis not present

## 2019-08-23 DIAGNOSIS — R1312 Dysphagia, oropharyngeal phase: Secondary | ICD-10-CM | POA: Diagnosis not present

## 2019-08-25 DIAGNOSIS — R1310 Dysphagia, unspecified: Secondary | ICD-10-CM | POA: Diagnosis not present

## 2019-08-25 DIAGNOSIS — R1312 Dysphagia, oropharyngeal phase: Secondary | ICD-10-CM | POA: Diagnosis not present

## 2019-08-30 DIAGNOSIS — R1310 Dysphagia, unspecified: Secondary | ICD-10-CM | POA: Diagnosis not present

## 2019-08-30 DIAGNOSIS — R1312 Dysphagia, oropharyngeal phase: Secondary | ICD-10-CM | POA: Diagnosis not present

## 2019-08-31 ENCOUNTER — Other Ambulatory Visit (HOSPITAL_COMMUNITY): Payer: Self-pay

## 2019-08-31 DIAGNOSIS — R1310 Dysphagia, unspecified: Secondary | ICD-10-CM

## 2019-08-31 DIAGNOSIS — R059 Cough, unspecified: Secondary | ICD-10-CM

## 2019-09-01 DIAGNOSIS — R1310 Dysphagia, unspecified: Secondary | ICD-10-CM | POA: Diagnosis not present

## 2019-09-01 DIAGNOSIS — R1312 Dysphagia, oropharyngeal phase: Secondary | ICD-10-CM | POA: Diagnosis not present

## 2019-09-05 ENCOUNTER — Ambulatory Visit (HOSPITAL_COMMUNITY)
Admission: RE | Admit: 2019-09-05 | Discharge: 2019-09-05 | Disposition: A | Discharge status: Home / Self Care | Payer: Medicare Other | Source: Ambulatory Visit | Attending: Geriatric Medicine | Admitting: Geriatric Medicine

## 2019-09-05 ENCOUNTER — Other Ambulatory Visit: Payer: Self-pay

## 2019-09-05 DIAGNOSIS — R1310 Dysphagia, unspecified: Secondary | ICD-10-CM | POA: Diagnosis not present

## 2019-09-05 DIAGNOSIS — R05 Cough: Secondary | ICD-10-CM | POA: Diagnosis not present

## 2019-09-05 DIAGNOSIS — R059 Cough, unspecified: Secondary | ICD-10-CM

## 2019-09-05 NOTE — Progress Notes (Signed)
Objective Swallowing Evaluation: Type of Study: MBS-Modified Barium Swallow Study   Patient Details  Name: Savannah Diaz MRN: [id number] Date of Birth: 12/15/1932  Today's Date: 09/05/2019 Time: SLP Start Time (ACUTE ONLY): 1200 -SLP Stop Time (ACUTE ONLY): 1230  SLP Time Calculation (min) (ACUTE ONLY): 30 min   Past Medical History:  Past Medical History:  Diagnosis Date  . Anxiety   . Arthritis    "qwhere; but it doesn't hurt" (05/12/2012)  . Aspiration pneumonia ([place]) 03/21/2016  . Cataract    left  . Depression   . Dysphagia    "have had it for 7 yr; got worse 4 days ago" (05/12/2012)  . Dysphagia, pharyngoesophageal phase - suspected 05/10/2012  . GERD (gastroesophageal reflux disease)   . Hypertension   . Migraines    "menopause cured them" (05/12/2012)  . Osteoporosis    Past Surgical History:  Past Surgical History:  Procedure Laterality Date  . CATARACT EXTRACTION W/ INTRAOCULAR LENS IMPLANT Left ? 2010  . ESOPHAGEAL MANOMETRY N/A 11/08/2018   Procedure: ESOPHAGEAL MANOMETRY (EM);  Surgeon: Gatha Mayer, MD;  Location: [facility];  Service: Endoscopy;  Laterality: N/A;  . ESOPHAGOGASTRODUODENOSCOPY N/A 05/10/2012   Procedure: ESOPHAGOGASTRODUODENOSCOPY (EGD);  Surgeon: Jerene Bears, MD;  Location: [facility];  Service: Gastroenterology;  Laterality: N/A;  . TONSILLECTOMY  1940's   HPI: Pt arrives for an OP MBS with prolonged complaint of dysphagia, sensation of residue. She reports she has been working with her SLP at [facility] and has had some success with a chin tuck. She has been relying on pureed or very soft foods and is hopeful to try more solid textures. Work up with Dr. Carlean Purl was unrevealing for primary cervical esophageal or esophageal impairment in that mannometry was WNL. No endoscopy in chart since 2014   No data recorded   Assessment / Plan / Recommendation  CHL IP CLINICAL IMPRESSIONS 09/05/2019  Clinical Impression Pt demonstrates a  mild dysphagia with multiple impairments impacting bolus flow. Pt is noted to have decreased base of tongue propulsion and at times partial epiglottic deflection. 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 Functionally, these impairments combine to result in mild vallecular and pyriform residue with thin liquids and more moderate residue on the posterior pharyngeal wall with solids. A chin tuck increased pt comfort and sensation of bolus flow, but did not decrease residue in comparison with normal trials. Best method to clear residue was a liquid wash. Effortful swallows were of no benefit as these  could have a negative impact on cervical esophageal anatomy over the long term given presence of prominent CP segment and web. Pt is capable of tolerating moist solids, but may need to avoid meats, granular/particulate solids such as nuts or seeds, etc. Defer f/u to primary SLP.   SLP Visit Diagnosis Dysphagia, pharyngoesophageal phase (R13.14)  Attention and concentration deficit following --  Frontal lobe and executive function deficit following --  Impact on safety and function Mild aspiration risk      CHL IP TREATMENT RECOMMENDATION 09/05/2019  Treatment Recommendations Defer treatment plan to f/u with SLP     Prognosis 09/05/2019  Prognosis for Safe Diet Advancement Good  Barriers to Reach Goals --  Barriers/Prognosis Comment --    CHL IP DIET RECOMMENDATION 09/05/2019  SLP Diet Recommendations --  Liquid Administration via Cup;Straw  Medication Administration Crushed with puree  Compensations Slow rate;Small sips/bites;Follow solids with liquid;Multiple dry swallows after each bite/sip  Postural Changes Seated upright at 90 degrees      CHL IP OTHER RECOMMENDATIONS 09/05/2019  Recommended Consults Consider ENT evaluation  Oral Care Recommendations --  Other Recommendations --      CHL IP FOLLOW UP RECOMMENDATIONS 09/05/2019  Follow up  Recommendations Home health SLP      CHL IP FREQUENCY AND DURATION 05/13/2012  Speech Therapy Frequency (ACUTE ONLY) min 2x/week  Treatment Duration 2 weeks           CHL IP ORAL PHASE 09/05/2019  Oral Phase Impaired  Oral - Pudding Teaspoon --  Oral - Pudding Cup --  Oral - Honey Teaspoon --  Oral - Honey Cup --  Oral - Nectar Teaspoon --  Oral - Nectar Cup --  Oral - Nectar Straw --  Oral - Thin Teaspoon --  Oral - Thin Cup WFL  Oral - Thin Straw WFL  Oral - Puree Delayed oral transit;Decreased bolus cohesion;Reduced posterior propulsion  Oral - Mech Soft --  Oral - Regular Delayed oral transit;Decreased bolus cohesion;Reduced posterior propulsion  Oral - Multi-Consistency --  Oral - Pill Other (Comment)  Oral Phase - Comment --    CHL IP PHARYNGEAL PHASE 09/05/2019  Pharyngeal Phase Impaired  Pharyngeal- Pudding Teaspoon --  Pharyngeal --  Pharyngeal- Pudding Cup --  Pharyngeal --  Pharyngeal- Honey Teaspoon --  Pharyngeal --  Pharyngeal- Honey Cup --  Pharyngeal --  Pharyngeal- Nectar Teaspoon --  Pharyngeal --  Pharyngeal- Nectar Cup --  Pharyngeal --  Pharyngeal- Nectar Straw --  Pharyngeal --  Pharyngeal- Thin Teaspoon --  Pharyngeal --  Pharyngeal- Thin Cup Reduced tongue base retraction;Penetration/Aspiration during swallow;Pharyngeal residue - valleculae;Pharyngeal residue - posterior pharnyx;Pharyngeal residue - pyriform  Pharyngeal Material enters airway, remains ABOVE vocal cords then ejected out;Material does not enter airway  Pharyngeal- Thin Straw Reduced tongue base retraction;Penetration/Aspiration during swallow;Pharyngeal residue - valleculae;Pharyngeal residue - posterior pharnyx;Pharyngeal residue - pyriform  Pharyngeal Material enters airway, remains ABOVE vocal cords then ejected out;Material does not enter airway  Pharyngeal- Puree Reduced tongue base retraction;Pharyngeal residue - valleculae;Pharyngeal residue - posterior pharnyx;Pharyngeal  residue - pyriform  Pharyngeal Material does not enter airway  Pharyngeal- Mechanical Soft --  Pharyngeal --  Pharyngeal- Regular Reduced tongue base retraction;Pharyngeal residue - valleculae;Pharyngeal residue - posterior pharnyx;Pharyngeal residue - pyriform  Pharyngeal --  Pharyngeal- Multi-consistency --  Pharyngeal --  Pharyngeal- Pill --  Pharyngeal --  Pharyngeal Comment --     CHL IP CERVICAL ESOPHAGEAL PHASE 07/11/2016  Cervical Esophageal Phase Impaired  Pudding Teaspoon --  Pudding Cup --  Honey Teaspoon --  Honey Cup --  Nectar Teaspoon --  Nectar Cup Prominent cricopharyngeal segment  Nectar Straw --  Thin Teaspoon --  Thin Cup Prominent cricopharyngeal segment  Thin Straw Prominent cricopharyngeal segment  Puree Prominent cricopharyngeal segment;Esophageal backflow into the pharynx  Mechanical Soft NT  Regular Reduced cricopharyngeal relaxation;Prominent cricopharyngeal segment;Esophageal backflow into cervical esophagus  Multi-consistency --  Pill Prominent cricopharyngeal segment  Cervical Esophageal Comment --     Lynann Beaver 09/05/2019, 3:47 PM

## 2019-09-06 DIAGNOSIS — R1310 Dysphagia, unspecified: Secondary | ICD-10-CM | POA: Diagnosis not present

## 2019-09-06 DIAGNOSIS — R1312 Dysphagia, oropharyngeal phase: Secondary | ICD-10-CM | POA: Diagnosis not present

## 2019-09-08 DIAGNOSIS — R1312 Dysphagia, oropharyngeal phase: Secondary | ICD-10-CM | POA: Diagnosis not present

## 2019-09-08 DIAGNOSIS — R1310 Dysphagia, unspecified: Secondary | ICD-10-CM | POA: Diagnosis not present

## 2019-09-13 DIAGNOSIS — R1312 Dysphagia, oropharyngeal phase: Secondary | ICD-10-CM | POA: Diagnosis not present

## 2019-09-13 DIAGNOSIS — R1310 Dysphagia, unspecified: Secondary | ICD-10-CM | POA: Diagnosis not present

## 2019-09-16 DIAGNOSIS — R1310 Dysphagia, unspecified: Secondary | ICD-10-CM | POA: Diagnosis not present

## 2019-09-16 DIAGNOSIS — R1312 Dysphagia, oropharyngeal phase: Secondary | ICD-10-CM | POA: Diagnosis not present

## 2019-09-20 DIAGNOSIS — K219 Gastro-esophageal reflux disease without esophagitis: Secondary | ICD-10-CM | POA: Diagnosis not present

## 2019-09-20 DIAGNOSIS — R1312 Dysphagia, oropharyngeal phase: Secondary | ICD-10-CM | POA: Diagnosis not present

## 2019-09-21 DIAGNOSIS — R1312 Dysphagia, oropharyngeal phase: Secondary | ICD-10-CM | POA: Diagnosis not present

## 2019-09-21 DIAGNOSIS — R1310 Dysphagia, unspecified: Secondary | ICD-10-CM | POA: Diagnosis not present

## 2019-09-23 DIAGNOSIS — R1312 Dysphagia, oropharyngeal phase: Secondary | ICD-10-CM | POA: Diagnosis not present

## 2019-09-23 DIAGNOSIS — R1310 Dysphagia, unspecified: Secondary | ICD-10-CM | POA: Diagnosis not present

## 2019-09-27 DIAGNOSIS — R1312 Dysphagia, oropharyngeal phase: Secondary | ICD-10-CM | POA: Diagnosis not present

## 2019-09-27 DIAGNOSIS — R1310 Dysphagia, unspecified: Secondary | ICD-10-CM | POA: Diagnosis not present

## 2019-09-29 DIAGNOSIS — R1310 Dysphagia, unspecified: Secondary | ICD-10-CM | POA: Diagnosis not present

## 2019-09-29 DIAGNOSIS — R1312 Dysphagia, oropharyngeal phase: Secondary | ICD-10-CM | POA: Diagnosis not present

## 2019-10-04 DIAGNOSIS — R1310 Dysphagia, unspecified: Secondary | ICD-10-CM | POA: Diagnosis not present

## 2019-10-04 DIAGNOSIS — F411 Generalized anxiety disorder: Secondary | ICD-10-CM | POA: Diagnosis not present

## 2019-10-04 DIAGNOSIS — R1312 Dysphagia, oropharyngeal phase: Secondary | ICD-10-CM | POA: Diagnosis not present

## 2019-10-04 DIAGNOSIS — I129 Hypertensive chronic kidney disease with stage 1 through stage 4 chronic kidney disease, or unspecified chronic kidney disease: Secondary | ICD-10-CM | POA: Diagnosis not present

## 2019-10-04 DIAGNOSIS — N1831 Chronic kidney disease, stage 3a: Secondary | ICD-10-CM | POA: Diagnosis not present

## 2019-10-04 DIAGNOSIS — F3341 Major depressive disorder, recurrent, in partial remission: Secondary | ICD-10-CM | POA: Diagnosis not present

## 2019-10-04 DIAGNOSIS — K219 Gastro-esophageal reflux disease without esophagitis: Secondary | ICD-10-CM | POA: Diagnosis not present

## 2019-10-04 DIAGNOSIS — R1314 Dysphagia, pharyngoesophageal phase: Secondary | ICD-10-CM | POA: Diagnosis not present

## 2019-10-06 DIAGNOSIS — R1310 Dysphagia, unspecified: Secondary | ICD-10-CM | POA: Diagnosis not present

## 2019-10-06 DIAGNOSIS — R1312 Dysphagia, oropharyngeal phase: Secondary | ICD-10-CM | POA: Diagnosis not present

## 2019-10-07 ENCOUNTER — Encounter (HOSPITAL_COMMUNITY): Payer: Self-pay

## 2019-10-07 ENCOUNTER — Emergency Department (HOSPITAL_COMMUNITY)
Admission: EM | Admit: 2019-10-07 | Discharge: 2019-10-08 | Disposition: A | Discharge status: Home / Self Care | Payer: Medicare Other | Attending: Emergency Medicine | Admitting: Emergency Medicine

## 2019-10-07 DIAGNOSIS — R1310 Dysphagia, unspecified: Secondary | ICD-10-CM | POA: Diagnosis not present

## 2019-10-07 DIAGNOSIS — Z5321 Procedure and treatment not carried out due to patient leaving prior to being seen by health care provider: Secondary | ICD-10-CM | POA: Diagnosis not present

## 2019-10-07 NOTE — ED Triage Notes (Signed)
Pt reports intermittent swallowing difficulty x years w/ multiple visits over the last few years for same.Pt reports she noted yesterday she had an episode where she couldn't swallow, has not eating since yesterday, and unable to take meds. Airway is patent and clear, no difficulty breathing.

## 2019-10-14 DIAGNOSIS — N390 Urinary tract infection, site not specified: Secondary | ICD-10-CM | POA: Diagnosis not present

## 2019-10-18 DIAGNOSIS — R1310 Dysphagia, unspecified: Secondary | ICD-10-CM | POA: Diagnosis not present

## 2019-10-18 DIAGNOSIS — R1312 Dysphagia, oropharyngeal phase: Secondary | ICD-10-CM | POA: Diagnosis not present

## 2019-10-20 DIAGNOSIS — R300 Dysuria: Secondary | ICD-10-CM | POA: Diagnosis not present

## 2019-10-20 DIAGNOSIS — N1831 Chronic kidney disease, stage 3a: Secondary | ICD-10-CM | POA: Diagnosis not present

## 2019-10-20 DIAGNOSIS — R1314 Dysphagia, pharyngoesophageal phase: Secondary | ICD-10-CM | POA: Diagnosis not present

## 2019-10-20 DIAGNOSIS — I129 Hypertensive chronic kidney disease with stage 1 through stage 4 chronic kidney disease, or unspecified chronic kidney disease: Secondary | ICD-10-CM | POA: Diagnosis not present

## 2019-10-25 DIAGNOSIS — R1312 Dysphagia, oropharyngeal phase: Secondary | ICD-10-CM | POA: Diagnosis not present

## 2019-10-25 DIAGNOSIS — R1310 Dysphagia, unspecified: Secondary | ICD-10-CM | POA: Diagnosis not present

## 2019-10-27 DIAGNOSIS — R1310 Dysphagia, unspecified: Secondary | ICD-10-CM | POA: Diagnosis not present

## 2019-10-27 DIAGNOSIS — R1312 Dysphagia, oropharyngeal phase: Secondary | ICD-10-CM | POA: Diagnosis not present

## 2019-11-01 DIAGNOSIS — R1310 Dysphagia, unspecified: Secondary | ICD-10-CM | POA: Diagnosis not present

## 2019-11-01 DIAGNOSIS — R1312 Dysphagia, oropharyngeal phase: Secondary | ICD-10-CM | POA: Diagnosis not present

## 2019-11-15 DIAGNOSIS — Z23 Encounter for immunization: Secondary | ICD-10-CM | POA: Diagnosis not present

## 2019-11-26 DIAGNOSIS — H2511 Age-related nuclear cataract, right eye: Secondary | ICD-10-CM | POA: Diagnosis not present

## 2019-12-30 DIAGNOSIS — Z0000 Encounter for general adult medical examination without abnormal findings: Secondary | ICD-10-CM | POA: Diagnosis not present

## 2019-12-30 DIAGNOSIS — R1310 Dysphagia, unspecified: Secondary | ICD-10-CM | POA: Diagnosis not present

## 2019-12-30 DIAGNOSIS — Z1389 Encounter for screening for other disorder: Secondary | ICD-10-CM | POA: Diagnosis not present

## 2019-12-30 DIAGNOSIS — F3341 Major depressive disorder, recurrent, in partial remission: Secondary | ICD-10-CM | POA: Diagnosis not present

## 2019-12-30 DIAGNOSIS — K219 Gastro-esophageal reflux disease without esophagitis: Secondary | ICD-10-CM | POA: Diagnosis not present

## 2019-12-30 DIAGNOSIS — I129 Hypertensive chronic kidney disease with stage 1 through stage 4 chronic kidney disease, or unspecified chronic kidney disease: Secondary | ICD-10-CM | POA: Diagnosis not present

## 2019-12-30 DIAGNOSIS — N1831 Chronic kidney disease, stage 3a: Secondary | ICD-10-CM | POA: Diagnosis not present

## 2020-01-09 DIAGNOSIS — Z23 Encounter for immunization: Secondary | ICD-10-CM | POA: Diagnosis not present

## 2020-01-30 DIAGNOSIS — R350 Frequency of micturition: Secondary | ICD-10-CM | POA: Diagnosis not present

## 2020-02-12 DIAGNOSIS — R319 Hematuria, unspecified: Secondary | ICD-10-CM | POA: Diagnosis not present

## 2020-02-12 DIAGNOSIS — N390 Urinary tract infection, site not specified: Secondary | ICD-10-CM | POA: Diagnosis not present

## 2020-02-13 DIAGNOSIS — N3001 Acute cystitis with hematuria: Secondary | ICD-10-CM | POA: Diagnosis not present

## 2020-03-06 DIAGNOSIS — N1831 Chronic kidney disease, stage 3a: Secondary | ICD-10-CM | POA: Diagnosis not present

## 2020-03-06 DIAGNOSIS — I129 Hypertensive chronic kidney disease with stage 1 through stage 4 chronic kidney disease, or unspecified chronic kidney disease: Secondary | ICD-10-CM | POA: Diagnosis not present

## 2020-03-26 DIAGNOSIS — I129 Hypertensive chronic kidney disease with stage 1 through stage 4 chronic kidney disease, or unspecified chronic kidney disease: Secondary | ICD-10-CM | POA: Diagnosis not present

## 2020-03-26 DIAGNOSIS — L821 Other seborrheic keratosis: Secondary | ICD-10-CM | POA: Diagnosis not present

## 2020-03-27 DIAGNOSIS — R110 Nausea: Secondary | ICD-10-CM | POA: Insufficient documentation

## 2020-05-17 DIAGNOSIS — R8290 Unspecified abnormal findings in urine: Secondary | ICD-10-CM | POA: Diagnosis not present

## 2020-05-17 DIAGNOSIS — F419 Anxiety disorder, unspecified: Secondary | ICD-10-CM | POA: Diagnosis not present

## 2020-05-30 DIAGNOSIS — I129 Hypertensive chronic kidney disease with stage 1 through stage 4 chronic kidney disease, or unspecified chronic kidney disease: Secondary | ICD-10-CM | POA: Diagnosis not present

## 2020-05-30 DIAGNOSIS — F3341 Major depressive disorder, recurrent, in partial remission: Secondary | ICD-10-CM | POA: Diagnosis not present

## 2020-05-30 DIAGNOSIS — N1831 Chronic kidney disease, stage 3a: Secondary | ICD-10-CM | POA: Diagnosis not present

## 2020-05-30 DIAGNOSIS — R110 Nausea: Secondary | ICD-10-CM | POA: Diagnosis not present

## 2020-06-19 DIAGNOSIS — R109 Unspecified abdominal pain: Secondary | ICD-10-CM | POA: Diagnosis not present

## 2020-06-19 DIAGNOSIS — I129 Hypertensive chronic kidney disease with stage 1 through stage 4 chronic kidney disease, or unspecified chronic kidney disease: Secondary | ICD-10-CM | POA: Diagnosis not present

## 2020-06-19 DIAGNOSIS — Z79899 Other long term (current) drug therapy: Secondary | ICD-10-CM | POA: Diagnosis not present

## 2020-06-19 DIAGNOSIS — F411 Generalized anxiety disorder: Secondary | ICD-10-CM | POA: Diagnosis not present

## 2020-06-19 DIAGNOSIS — N1831 Chronic kidney disease, stage 3a: Secondary | ICD-10-CM | POA: Diagnosis not present

## 2020-06-25 DIAGNOSIS — Z23 Encounter for immunization: Secondary | ICD-10-CM | POA: Diagnosis not present

## 2020-07-03 ENCOUNTER — Other Ambulatory Visit: Payer: Self-pay | Admitting: Geriatric Medicine

## 2020-07-03 DIAGNOSIS — R109 Unspecified abdominal pain: Secondary | ICD-10-CM

## 2020-07-17 ENCOUNTER — Other Ambulatory Visit: Payer: Self-pay

## 2020-07-17 ENCOUNTER — Ambulatory Visit
Admission: RE | Admit: 2020-07-17 | Discharge: 2020-07-17 | Disposition: A | Discharge status: Home / Self Care | Payer: Medicare Other | Source: Ambulatory Visit | Attending: Geriatric Medicine | Admitting: Geriatric Medicine

## 2020-07-17 DIAGNOSIS — N281 Cyst of kidney, acquired: Secondary | ICD-10-CM | POA: Diagnosis not present

## 2020-07-17 DIAGNOSIS — R109 Unspecified abdominal pain: Secondary | ICD-10-CM

## 2020-07-17 DIAGNOSIS — K7689 Other specified diseases of liver: Secondary | ICD-10-CM | POA: Diagnosis not present

## 2020-07-17 DIAGNOSIS — R110 Nausea: Secondary | ICD-10-CM | POA: Diagnosis not present

## 2020-07-17 DIAGNOSIS — M4856XA Collapsed vertebra, not elsewhere classified, lumbar region, initial encounter for fracture: Secondary | ICD-10-CM | POA: Diagnosis not present

## 2020-07-17 MED ORDER — IOPAMIDOL (ISOVUE-300) INJECTION 61%
100.0000 mL | Freq: Once | INTRAVENOUS | Status: AC | PRN
  Administered 2020-07-17: 100 mL via INTRAVENOUS

## 2020-07-18 DIAGNOSIS — N1831 Chronic kidney disease, stage 3a: Secondary | ICD-10-CM | POA: Diagnosis not present

## 2020-07-18 DIAGNOSIS — R634 Abnormal weight loss: Secondary | ICD-10-CM | POA: Diagnosis not present

## 2020-07-18 DIAGNOSIS — I129 Hypertensive chronic kidney disease with stage 1 through stage 4 chronic kidney disease, or unspecified chronic kidney disease: Secondary | ICD-10-CM | POA: Diagnosis not present

## 2020-08-09 ENCOUNTER — Other Ambulatory Visit: Payer: Self-pay

## 2020-08-09 ENCOUNTER — Encounter: Payer: Medicare Other | Attending: Geriatric Medicine | Admitting: Dietician

## 2020-08-09 DIAGNOSIS — Z713 Dietary counseling and surveillance: Secondary | ICD-10-CM | POA: Insufficient documentation

## 2020-08-09 DIAGNOSIS — R1312 Dysphagia, oropharyngeal phase: Secondary | ICD-10-CM

## 2020-08-09 DIAGNOSIS — N1831 Chronic kidney disease, stage 3a: Secondary | ICD-10-CM | POA: Diagnosis not present
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
## 2020-08-09 NOTE — Patient Instructions (Addendum)
Consider therapy, Consider thankfulness journal (5 things you are thankful for each day). Don't worry.   Exercise/activity daily- consider going to the new Wellness center Continue to do social things Increase your snacks  Muffins  Smoothies  Homemade tapioca pudding  Peanut butter and apple or banana  Yogurt (soy, coconut, etc if you cannot tolerate the dairy)  Nuts  Consider getting your vitamin D level checked.

## 2020-08-22 DIAGNOSIS — M542 Cervicalgia: Secondary | ICD-10-CM | POA: Diagnosis not present

## 2020-08-22 DIAGNOSIS — F32A Depression, unspecified: Secondary | ICD-10-CM | POA: Diagnosis not present

## 2020-08-22 DIAGNOSIS — M6281 Muscle weakness (generalized): Secondary | ICD-10-CM | POA: Diagnosis not present

## 2020-09-07 DIAGNOSIS — R2689 Other abnormalities of gait and mobility: Secondary | ICD-10-CM | POA: Diagnosis not present

## 2020-09-07 DIAGNOSIS — M6281 Muscle weakness (generalized): Secondary | ICD-10-CM | POA: Diagnosis not present

## 2020-09-07 DIAGNOSIS — M542 Cervicalgia: Secondary | ICD-10-CM | POA: Diagnosis not present

## 2020-09-07 DIAGNOSIS — F32A Depression, unspecified: Secondary | ICD-10-CM | POA: Diagnosis not present

## 2020-09-07 DIAGNOSIS — R2681 Unsteadiness on feet: Secondary | ICD-10-CM | POA: Diagnosis not present

## 2020-09-10 DIAGNOSIS — R2681 Unsteadiness on feet: Secondary | ICD-10-CM | POA: Diagnosis not present

## 2020-09-10 DIAGNOSIS — M6281 Muscle weakness (generalized): Secondary | ICD-10-CM | POA: Diagnosis not present

## 2020-09-10 DIAGNOSIS — R2689 Other abnormalities of gait and mobility: Secondary | ICD-10-CM | POA: Diagnosis not present

## 2020-09-10 DIAGNOSIS — M542 Cervicalgia: Secondary | ICD-10-CM | POA: Diagnosis not present

## 2020-09-10 DIAGNOSIS — F32A Depression, unspecified: Secondary | ICD-10-CM | POA: Diagnosis not present

## 2020-09-12 DIAGNOSIS — M6281 Muscle weakness (generalized): Secondary | ICD-10-CM | POA: Diagnosis not present

## 2020-09-12 DIAGNOSIS — M542 Cervicalgia: Secondary | ICD-10-CM | POA: Diagnosis not present

## 2020-09-12 DIAGNOSIS — F32A Depression, unspecified: Secondary | ICD-10-CM | POA: Diagnosis not present

## 2020-09-12 DIAGNOSIS — R2689 Other abnormalities of gait and mobility: Secondary | ICD-10-CM | POA: Diagnosis not present

## 2020-09-12 DIAGNOSIS — R2681 Unsteadiness on feet: Secondary | ICD-10-CM | POA: Diagnosis not present

## 2020-09-13 DIAGNOSIS — R2689 Other abnormalities of gait and mobility: Secondary | ICD-10-CM | POA: Diagnosis not present

## 2020-09-13 DIAGNOSIS — F32A Depression, unspecified: Secondary | ICD-10-CM | POA: Diagnosis not present

## 2020-09-13 DIAGNOSIS — M542 Cervicalgia: Secondary | ICD-10-CM | POA: Diagnosis not present

## 2020-09-13 DIAGNOSIS — M6281 Muscle weakness (generalized): Secondary | ICD-10-CM | POA: Diagnosis not present

## 2020-09-13 DIAGNOSIS — R2681 Unsteadiness on feet: Secondary | ICD-10-CM | POA: Diagnosis not present

## 2020-09-14 DIAGNOSIS — Z79899 Other long term (current) drug therapy: Secondary | ICD-10-CM | POA: Diagnosis not present

## 2020-09-14 DIAGNOSIS — H25091 Other age-related incipient cataract, right eye: Secondary | ICD-10-CM | POA: Diagnosis not present

## 2020-09-14 DIAGNOSIS — R202 Paresthesia of skin: Secondary | ICD-10-CM | POA: Diagnosis not present

## 2020-09-14 DIAGNOSIS — I129 Hypertensive chronic kidney disease with stage 1 through stage 4 chronic kidney disease, or unspecified chronic kidney disease: Secondary | ICD-10-CM | POA: Diagnosis not present

## 2020-09-14 DIAGNOSIS — R634 Abnormal weight loss: Secondary | ICD-10-CM | POA: Diagnosis not present

## 2020-09-14 DIAGNOSIS — N1831 Chronic kidney disease, stage 3a: Secondary | ICD-10-CM | POA: Diagnosis not present

## 2020-09-17 DIAGNOSIS — R2689 Other abnormalities of gait and mobility: Secondary | ICD-10-CM | POA: Diagnosis not present

## 2020-09-17 DIAGNOSIS — M542 Cervicalgia: Secondary | ICD-10-CM | POA: Diagnosis not present

## 2020-09-17 DIAGNOSIS — F32A Depression, unspecified: Secondary | ICD-10-CM | POA: Diagnosis not present

## 2020-09-17 DIAGNOSIS — R2681 Unsteadiness on feet: Secondary | ICD-10-CM | POA: Diagnosis not present

## 2020-09-17 DIAGNOSIS — M6281 Muscle weakness (generalized): Secondary | ICD-10-CM | POA: Diagnosis not present

## 2020-09-19 DIAGNOSIS — R2681 Unsteadiness on feet: Secondary | ICD-10-CM | POA: Diagnosis not present

## 2020-09-19 DIAGNOSIS — M542 Cervicalgia: Secondary | ICD-10-CM | POA: Diagnosis not present

## 2020-09-19 DIAGNOSIS — F32A Depression, unspecified: Secondary | ICD-10-CM | POA: Diagnosis not present

## 2020-09-19 DIAGNOSIS — R2689 Other abnormalities of gait and mobility: Secondary | ICD-10-CM | POA: Diagnosis not present

## 2020-09-19 DIAGNOSIS — M6281 Muscle weakness (generalized): Secondary | ICD-10-CM | POA: Diagnosis not present

## 2020-09-20 DIAGNOSIS — M542 Cervicalgia: Secondary | ICD-10-CM | POA: Diagnosis not present

## 2020-09-20 DIAGNOSIS — R2681 Unsteadiness on feet: Secondary | ICD-10-CM | POA: Diagnosis not present

## 2020-09-20 DIAGNOSIS — F32A Depression, unspecified: Secondary | ICD-10-CM | POA: Diagnosis not present

## 2020-09-20 DIAGNOSIS — M6281 Muscle weakness (generalized): Secondary | ICD-10-CM | POA: Diagnosis not present

## 2020-09-20 DIAGNOSIS — R2689 Other abnormalities of gait and mobility: Secondary | ICD-10-CM | POA: Diagnosis not present

## 2020-09-21 DIAGNOSIS — F4323 Adjustment disorder with mixed anxiety and depressed mood: Secondary | ICD-10-CM | POA: Diagnosis not present

## 2020-09-24 DIAGNOSIS — F32A Depression, unspecified: Secondary | ICD-10-CM | POA: Diagnosis not present

## 2020-09-24 DIAGNOSIS — R2681 Unsteadiness on feet: Secondary | ICD-10-CM | POA: Diagnosis not present

## 2020-09-24 DIAGNOSIS — M6281 Muscle weakness (generalized): Secondary | ICD-10-CM | POA: Diagnosis not present

## 2020-09-24 DIAGNOSIS — R2689 Other abnormalities of gait and mobility: Secondary | ICD-10-CM | POA: Diagnosis not present

## 2020-09-24 DIAGNOSIS — M542 Cervicalgia: Secondary | ICD-10-CM | POA: Diagnosis not present

## 2020-09-26 DIAGNOSIS — M542 Cervicalgia: Secondary | ICD-10-CM | POA: Diagnosis not present

## 2020-09-26 DIAGNOSIS — R2681 Unsteadiness on feet: Secondary | ICD-10-CM | POA: Diagnosis not present

## 2020-09-26 DIAGNOSIS — F32A Depression, unspecified: Secondary | ICD-10-CM | POA: Diagnosis not present

## 2020-09-26 DIAGNOSIS — R2689 Other abnormalities of gait and mobility: Secondary | ICD-10-CM | POA: Diagnosis not present

## 2020-09-26 DIAGNOSIS — M6281 Muscle weakness (generalized): Secondary | ICD-10-CM | POA: Diagnosis not present

## 2020-09-27 DIAGNOSIS — F4323 Adjustment disorder with mixed anxiety and depressed mood: Secondary | ICD-10-CM | POA: Diagnosis not present

## 2020-09-27 DIAGNOSIS — M542 Cervicalgia: Secondary | ICD-10-CM | POA: Diagnosis not present

## 2020-09-27 DIAGNOSIS — R2681 Unsteadiness on feet: Secondary | ICD-10-CM | POA: Diagnosis not present

## 2020-09-27 DIAGNOSIS — M6281 Muscle weakness (generalized): Secondary | ICD-10-CM | POA: Diagnosis not present

## 2020-09-27 DIAGNOSIS — R2689 Other abnormalities of gait and mobility: Secondary | ICD-10-CM | POA: Diagnosis not present

## 2020-09-27 DIAGNOSIS — F32A Depression, unspecified: Secondary | ICD-10-CM | POA: Diagnosis not present

## 2020-10-01 DIAGNOSIS — M542 Cervicalgia: Secondary | ICD-10-CM | POA: Diagnosis not present

## 2020-10-01 DIAGNOSIS — M6281 Muscle weakness (generalized): Secondary | ICD-10-CM | POA: Diagnosis not present

## 2020-10-01 DIAGNOSIS — R2689 Other abnormalities of gait and mobility: Secondary | ICD-10-CM | POA: Diagnosis not present

## 2020-10-01 DIAGNOSIS — R2681 Unsteadiness on feet: Secondary | ICD-10-CM | POA: Diagnosis not present

## 2020-10-01 DIAGNOSIS — F32A Depression, unspecified: Secondary | ICD-10-CM | POA: Diagnosis not present

## 2020-10-03 DIAGNOSIS — M6281 Muscle weakness (generalized): Secondary | ICD-10-CM | POA: Diagnosis not present

## 2020-10-03 DIAGNOSIS — M542 Cervicalgia: Secondary | ICD-10-CM | POA: Diagnosis not present

## 2020-10-03 DIAGNOSIS — R2689 Other abnormalities of gait and mobility: Secondary | ICD-10-CM | POA: Diagnosis not present

## 2020-10-03 DIAGNOSIS — R2681 Unsteadiness on feet: Secondary | ICD-10-CM | POA: Diagnosis not present

## 2020-10-03 DIAGNOSIS — F32A Depression, unspecified: Secondary | ICD-10-CM | POA: Diagnosis not present

## 2020-10-04 DIAGNOSIS — R2689 Other abnormalities of gait and mobility: Secondary | ICD-10-CM | POA: Diagnosis not present

## 2020-10-04 DIAGNOSIS — F32A Depression, unspecified: Secondary | ICD-10-CM | POA: Diagnosis not present

## 2020-10-04 DIAGNOSIS — M542 Cervicalgia: Secondary | ICD-10-CM | POA: Diagnosis not present

## 2020-10-04 DIAGNOSIS — R2681 Unsteadiness on feet: Secondary | ICD-10-CM | POA: Diagnosis not present

## 2020-10-04 DIAGNOSIS — M6281 Muscle weakness (generalized): Secondary | ICD-10-CM | POA: Diagnosis not present

## 2020-10-05 DIAGNOSIS — F4323 Adjustment disorder with mixed anxiety and depressed mood: Secondary | ICD-10-CM | POA: Diagnosis not present

## 2020-10-08 DIAGNOSIS — M542 Cervicalgia: Secondary | ICD-10-CM | POA: Diagnosis not present

## 2020-10-08 DIAGNOSIS — M6281 Muscle weakness (generalized): Secondary | ICD-10-CM | POA: Diagnosis not present

## 2020-10-08 DIAGNOSIS — R2681 Unsteadiness on feet: Secondary | ICD-10-CM | POA: Diagnosis not present

## 2020-10-08 DIAGNOSIS — F32A Depression, unspecified: Secondary | ICD-10-CM | POA: Diagnosis not present

## 2020-10-08 DIAGNOSIS — R2689 Other abnormalities of gait and mobility: Secondary | ICD-10-CM | POA: Diagnosis not present

## 2020-10-10 DIAGNOSIS — F32A Depression, unspecified: Secondary | ICD-10-CM | POA: Diagnosis not present

## 2020-10-10 DIAGNOSIS — R2681 Unsteadiness on feet: Secondary | ICD-10-CM | POA: Diagnosis not present

## 2020-10-10 DIAGNOSIS — M6281 Muscle weakness (generalized): Secondary | ICD-10-CM | POA: Diagnosis not present

## 2020-10-10 DIAGNOSIS — R2689 Other abnormalities of gait and mobility: Secondary | ICD-10-CM | POA: Diagnosis not present

## 2020-10-10 DIAGNOSIS — M542 Cervicalgia: Secondary | ICD-10-CM | POA: Diagnosis not present

## 2020-10-11 DIAGNOSIS — R2681 Unsteadiness on feet: Secondary | ICD-10-CM | POA: Diagnosis not present

## 2020-10-11 DIAGNOSIS — M542 Cervicalgia: Secondary | ICD-10-CM | POA: Diagnosis not present

## 2020-10-11 DIAGNOSIS — M6281 Muscle weakness (generalized): Secondary | ICD-10-CM | POA: Diagnosis not present

## 2020-10-11 DIAGNOSIS — F32A Depression, unspecified: Secondary | ICD-10-CM | POA: Diagnosis not present

## 2020-10-11 DIAGNOSIS — R2689 Other abnormalities of gait and mobility: Secondary | ICD-10-CM | POA: Diagnosis not present

## 2020-10-12 DIAGNOSIS — F4323 Adjustment disorder with mixed anxiety and depressed mood: Secondary | ICD-10-CM | POA: Diagnosis not present

## 2020-10-15 DIAGNOSIS — R2681 Unsteadiness on feet: Secondary | ICD-10-CM | POA: Diagnosis not present

## 2020-10-15 DIAGNOSIS — F32A Depression, unspecified: Secondary | ICD-10-CM | POA: Diagnosis not present

## 2020-10-15 DIAGNOSIS — R2689 Other abnormalities of gait and mobility: Secondary | ICD-10-CM | POA: Diagnosis not present

## 2020-10-15 DIAGNOSIS — M6281 Muscle weakness (generalized): Secondary | ICD-10-CM | POA: Diagnosis not present

## 2020-10-15 DIAGNOSIS — M542 Cervicalgia: Secondary | ICD-10-CM | POA: Diagnosis not present

## 2020-10-17 DIAGNOSIS — R2681 Unsteadiness on feet: Secondary | ICD-10-CM | POA: Diagnosis not present

## 2020-10-17 DIAGNOSIS — R2689 Other abnormalities of gait and mobility: Secondary | ICD-10-CM | POA: Diagnosis not present

## 2020-10-17 DIAGNOSIS — M6281 Muscle weakness (generalized): Secondary | ICD-10-CM | POA: Diagnosis not present

## 2020-10-17 DIAGNOSIS — F32A Depression, unspecified: Secondary | ICD-10-CM | POA: Diagnosis not present

## 2020-10-17 DIAGNOSIS — M542 Cervicalgia: Secondary | ICD-10-CM | POA: Diagnosis not present

## 2020-10-18 DIAGNOSIS — M542 Cervicalgia: Secondary | ICD-10-CM | POA: Diagnosis not present

## 2020-10-18 DIAGNOSIS — R2689 Other abnormalities of gait and mobility: Secondary | ICD-10-CM | POA: Diagnosis not present

## 2020-10-18 DIAGNOSIS — R2681 Unsteadiness on feet: Secondary | ICD-10-CM | POA: Diagnosis not present

## 2020-10-18 DIAGNOSIS — F32A Depression, unspecified: Secondary | ICD-10-CM | POA: Diagnosis not present

## 2020-10-18 DIAGNOSIS — M6281 Muscle weakness (generalized): Secondary | ICD-10-CM | POA: Diagnosis not present

## 2020-10-19 DIAGNOSIS — F4323 Adjustment disorder with mixed anxiety and depressed mood: Secondary | ICD-10-CM | POA: Diagnosis not present

## 2020-10-22 DIAGNOSIS — R110 Nausea: Secondary | ICD-10-CM | POA: Diagnosis not present

## 2020-10-22 DIAGNOSIS — N1831 Chronic kidney disease, stage 3a: Secondary | ICD-10-CM | POA: Diagnosis not present

## 2020-10-22 DIAGNOSIS — I129 Hypertensive chronic kidney disease with stage 1 through stage 4 chronic kidney disease, or unspecified chronic kidney disease: Secondary | ICD-10-CM | POA: Diagnosis not present

## 2020-10-22 DIAGNOSIS — F411 Generalized anxiety disorder: Secondary | ICD-10-CM | POA: Diagnosis not present

## 2020-10-24 DIAGNOSIS — R2681 Unsteadiness on feet: Secondary | ICD-10-CM | POA: Diagnosis not present

## 2020-10-24 DIAGNOSIS — M6281 Muscle weakness (generalized): Secondary | ICD-10-CM | POA: Diagnosis not present

## 2020-10-24 DIAGNOSIS — F32A Depression, unspecified: Secondary | ICD-10-CM | POA: Diagnosis not present

## 2020-10-24 DIAGNOSIS — R2689 Other abnormalities of gait and mobility: Secondary | ICD-10-CM | POA: Diagnosis not present

## 2020-10-24 DIAGNOSIS — M542 Cervicalgia: Secondary | ICD-10-CM | POA: Diagnosis not present

## 2020-10-25 DIAGNOSIS — M542 Cervicalgia: Secondary | ICD-10-CM | POA: Diagnosis not present

## 2020-10-25 DIAGNOSIS — F32A Depression, unspecified: Secondary | ICD-10-CM | POA: Diagnosis not present

## 2020-10-25 DIAGNOSIS — M6281 Muscle weakness (generalized): Secondary | ICD-10-CM | POA: Diagnosis not present

## 2020-10-26 DIAGNOSIS — F4323 Adjustment disorder with mixed anxiety and depressed mood: Secondary | ICD-10-CM | POA: Diagnosis not present

## 2020-10-30 DIAGNOSIS — F32A Depression, unspecified: Secondary | ICD-10-CM | POA: Diagnosis not present

## 2020-10-30 DIAGNOSIS — M542 Cervicalgia: Secondary | ICD-10-CM | POA: Diagnosis not present

## 2020-10-30 DIAGNOSIS — M6281 Muscle weakness (generalized): Secondary | ICD-10-CM | POA: Diagnosis not present

## 2020-11-01 DIAGNOSIS — M6281 Muscle weakness (generalized): Secondary | ICD-10-CM | POA: Diagnosis not present

## 2020-11-01 DIAGNOSIS — M542 Cervicalgia: Secondary | ICD-10-CM | POA: Diagnosis not present

## 2020-11-01 DIAGNOSIS — F32A Depression, unspecified: Secondary | ICD-10-CM | POA: Diagnosis not present

## 2020-11-02 DIAGNOSIS — F4323 Adjustment disorder with mixed anxiety and depressed mood: Secondary | ICD-10-CM | POA: Diagnosis not present

## 2020-11-14 DIAGNOSIS — Z23 Encounter for immunization: Secondary | ICD-10-CM | POA: Diagnosis not present

## 2020-11-16 DIAGNOSIS — N1831 Chronic kidney disease, stage 3a: Secondary | ICD-10-CM | POA: Diagnosis not present

## 2020-11-16 DIAGNOSIS — F411 Generalized anxiety disorder: Secondary | ICD-10-CM | POA: Diagnosis not present

## 2020-11-16 DIAGNOSIS — I129 Hypertensive chronic kidney disease with stage 1 through stage 4 chronic kidney disease, or unspecified chronic kidney disease: Secondary | ICD-10-CM | POA: Diagnosis not present

## 2020-11-16 DIAGNOSIS — R110 Nausea: Secondary | ICD-10-CM | POA: Diagnosis not present

## 2020-11-16 DIAGNOSIS — F4323 Adjustment disorder with mixed anxiety and depressed mood: Secondary | ICD-10-CM | POA: Diagnosis not present

## 2020-11-23 DIAGNOSIS — F4323 Adjustment disorder with mixed anxiety and depressed mood: Secondary | ICD-10-CM | POA: Diagnosis not present

## 2020-11-29 DIAGNOSIS — Z23 Encounter for immunization: Secondary | ICD-10-CM | POA: Diagnosis not present

## 2020-11-30 DIAGNOSIS — F4323 Adjustment disorder with mixed anxiety and depressed mood: Secondary | ICD-10-CM | POA: Diagnosis not present

## 2020-12-03 DIAGNOSIS — H25811 Combined forms of age-related cataract, right eye: Secondary | ICD-10-CM | POA: Diagnosis not present

## 2020-12-03 DIAGNOSIS — Z961 Presence of intraocular lens: Secondary | ICD-10-CM | POA: Diagnosis not present

## 2020-12-10 ENCOUNTER — Telehealth: Payer: Self-pay | Admitting: Internal Medicine

## 2020-12-10 NOTE — Telephone Encounter (Signed)
Inbound call from pt's daughter requesting a call back stating that her mother has been feeling nauseous for quite some time.Please advise. Thank you.

## 2020-12-11 NOTE — Telephone Encounter (Signed)
Spoke with Pt Daughter Abbey Veith. Daughter states that her mom has been experiencing nausea along with a poor appetite for many months; They have tried different things to help. Pt currently has an appointment with Dr, Carlean Purl on 01/09/2021. Requesting something sooner. Pt was scheduled with Ellouise Newer PA on 12/14/2020 @ 1:30. Heather made aware. Heather verbalized understanding with all questions answered.

## 2020-12-11 NOTE — Telephone Encounter (Signed)
Patients daughter Nira Conn is returning your call.

## 2020-12-14 ENCOUNTER — Ambulatory Visit (INDEPENDENT_AMBULATORY_CARE_PROVIDER_SITE_OTHER): Payer: Medicare Other | Admitting: Physician Assistant

## 2020-12-14 ENCOUNTER — Encounter: Payer: Self-pay | Admitting: Physician Assistant

## 2020-12-14 ENCOUNTER — Telehealth: Payer: Self-pay | Admitting: Physician Assistant

## 2020-12-14 VITALS — BP 120/78 | HR 74 | Ht 60.0 in | Wt 106.4 lb

## 2020-12-14 DIAGNOSIS — R159 Full incontinence of feces: Secondary | ICD-10-CM

## 2020-12-14 DIAGNOSIS — R110 Nausea: Secondary | ICD-10-CM

## 2020-12-14 DIAGNOSIS — K641 Second degree hemorrhoids: Secondary | ICD-10-CM | POA: Diagnosis not present

## 2020-12-14 DIAGNOSIS — F4323 Adjustment disorder with mixed anxiety and depressed mood: Secondary | ICD-10-CM | POA: Diagnosis not present

## 2020-12-14 MED ORDER — HYDROCORTISONE (PERIANAL) 2.5 % EX CREA: 1.0000 "application " | TOPICAL_CREAM | Freq: Two times a day (BID) | CUTANEOUS | 1 refills | Status: DC

## 2020-12-14 MED ORDER — OMEPRAZOLE 40 MG PO CPDR: 40.0000 mg | DELAYED_RELEASE_CAPSULE | Freq: Two times a day (BID) | ORAL | 1 refills | Status: DC

## 2020-12-14 NOTE — Telephone Encounter (Signed)
Patient called states she would like to know if Ellouise Newer PA-C can send her to [facility] physical therapy as discussed because it is easier for her to commute.

## 2020-12-14 NOTE — Patient Instructions (Addendum)
If you are age 85 or older, your body mass index should be between 23-30. Your Body mass index is 20.77 kg/m. If this is out of the aforementioned range listed, please consider follow up with your Primary Care Provider.  [facility] providers would like to encourage you to use [facility] to communicate with providers for non-urgent requests or questions.  Due to long hold times on the telephone, sending your provider a message by [facility] may be faster and more efficient way to get a response. Please allow 48 business hours for a response.  Please remember that this is for non-urgent requests/questions.  MEDICATION: We have sent the following medication to your pharmacy for you to pick up at your convenience: Omeprazole 40 mg tablet, take 1 tablet before breakfast and dinner. Anusol cream.  Purchase over the counter Preparation H suppositories. Use one twice a day and add the Anusol cream to the suppository.   AMB referral to rehabilitation  Where: [facility] Address: [address] [place] [address] Phone: [phone]   It was great seeing you today! Thank you for entrusting me with your care and choosing [facility].  Ellouise Newer, [place]

## 2020-12-14 NOTE — Progress Notes (Signed)
Chief Complaint: Nausea, fecal incontinence  HPI:    [name] is an 87 year old Caucasian female with a past medical history as listed below including dysphagia and reflux, known to Savannah Diaz, who was referred to me by Savannah Diaz for a complaint of nausea and fecal incontinence.    12/31/2018 office visit with Savannah Diaz.  At that time were revisiting her dysphagia.  Apparently manometry had not shed any light so it was thought her esophageal work-up was complete.  She was interested in pursuing speech therapy again and was referred for this.    05/10/2012 EGD at the hospital done for dysphagia with a small hiatal hernia and otherwise normal.    11/08/2018 esophageal manometry was normal.    09/05/2019 swallow function with speech pathology showed a mild dysphagia with multiple impairments impacting bolus flow.    06/19/2020 CMP and CBC normal.    07/18/2020 CT of the abdomen pelvis with and without contrast done for abdominal discomfort and nausea with bilateral renal cysts, sigmoid diverticulosis without evidence of diverticulitis, moderate superior endplate compression fracture deformity at L1, chronic.    Today, the patient presents to clinic accompanied by her daughter who does assist with her history.  She tells me that in February she started having issues with "nausea".  Tells me that she has always had a nervous stomach but was having a problem with just feeling "blah" in the morning and not wanting to eat anything.  Due to this she would force herself to have small meals throughout the day like an Ensure shake or a banana.  Tells me that then often when she lays down at night she will actually be hungry and get up to eat something.  Did try some Zofran, sounds like 4 mg twice a day, which was of no help.  She has always had chronic reflux and takes Famotidine 40 mg nightly.  Denies any overt reflux symptoms.  Her daughter tells me that they thought maybe it was her psych meds at one  point but they have changed everything around and there has been no change in her symptoms.  It has been a struggle for her to maintain her weight but she has.    Also complains today that she will have often 4 bowel movements in the morning.  She will not feel like she can get it all out.  Also describes that occasionally she does not make it to the toilet and will have full incontinence of a solid stool.  Denies any bleeding or rectal pain.  Did start Metamucil 2 tablespoons a day and this seems to have helped form her stool a bit more.    Denies fever, chills, vomiting or symptoms that awaken her from sleep.  Past Medical History:  Diagnosis Date   Anxiety    Arthritis    "qwhere; but it doesn't hurt" (05/12/2012)   Aspiration pneumonia ([place]) 03/21/2016   Cataract    left   Depression    Dysphagia    "have had it for 7 yr; got worse 4 days ago" (05/12/2012)   Dysphagia, pharyngoesophageal phase - suspected 05/10/2012   GERD (gastroesophageal reflux disease)    Hypertension    Migraines    "menopause cured them" (05/12/2012)   Osteoporosis     Past Surgical History:  Procedure Laterality Date   CATARACT EXTRACTION W/ INTRAOCULAR LENS IMPLANT Left ? 2010   ESOPHAGEAL MANOMETRY N/A 11/08/2018   Procedure: ESOPHAGEAL MANOMETRY (EM);  Surgeon: Savannah Diaz,  Savannah Neas, Diaz;  Location: [facility];  Service: Endoscopy;  Laterality: N/A;   ESOPHAGOGASTRODUODENOSCOPY N/A 05/10/2012   Procedure: ESOPHAGOGASTRODUODENOSCOPY (EGD);  Surgeon: Savannah Bears, Diaz;  Location: [facility];  Service: Gastroenterology;  Laterality: N/A;   TONSILLECTOMY  1940's    Current Outpatient Medications  Medication Sig Dispense Refill   amLODipine (NORVASC) 2.5 MG tablet Take 10 mg by mouth daily.     atenolol (TENORMIN) 50 MG tablet Take 1.5 tablets (75 mg total) by mouth daily. (Patient taking differently: Take 50 mg by mouth at bedtime.) 180 tablet 4   famotidine (PEPCID) 40 MG tablet Take 40 mg by mouth at bedtime.      hydrocortisone cream 1 % Apply 1 application topically daily. For dry skin on sides of mouth     LORazepam (ATIVAN) 0.5 MG tablet Take 0.5-1 mg by mouth See admin instructions. Take 1 tablet (0.5mg ) in the morning, 2 tablets (1mg ) in the afternoon, and 2 tablets (1mg ) at night     mirtazapine (REMERON SOL-TAB) 15 MG disintegrating tablet Take 15 mg by mouth at bedtime.   6   nortriptyline (PAMELOR) 10 MG/5ML solution Take 25 mLs by mouth at bedtime. (Patient not taking: Reported on 08/09/2020)     sertraline (ZOLOFT) 20 MG/ML concentrated solution Take by mouth daily.     spironolactone (ALDACTONE) 25 MG tablet Take 25 mg by mouth daily.     traZODone (DESYREL) 50 MG tablet Take 175 mg by mouth at bedtime.     No current facility-administered medications for this visit.    Allergies as of 12/14/2020 - Review Complete 08/09/2020  Allergen Reaction Noted   Lactose intolerance (gi) Other (See Comments) 05/14/2012   Lactulose Other (See Comments) 05/14/2012   Sulfa antibiotics  04/01/2018   Sulfa antibiotics Hives 05/10/2012    Family History  Problem Relation Age of Onset   Arthritis Mother    Hypertension Mother    Dementia Mother    Colon cancer Father     Social History   Socioeconomic History   Marital status: Widowed    Spouse name: Not on file   Number of children: 2   Years of education: Not on file   Highest education level: Not on file  Occupational History   Occupation: Retired  Tobacco Use   Smoking status: Never   Smokeless tobacco: Never  Substance and Sexual Activity   Alcohol use: Not Currently    Comment: 05/12/2012 "have a drink hardly ever anymore"   Drug use: No   Sexual activity: Not Currently  Other Topics Concern   Not on file  Social History Narrative   Widowed, retired, 2 daughters husband was a Mining engineer at [employer]   Her daughters and son-in-law's are all philosophy professors to it Savannah Diaz and Savannah Diaz, daughter here at [employer] son-in-law  at [employer] at [place]   No Etoh/tobacco       Social Determinants of Health   Financial Resource Strain: Not on file  Food Insecurity: Not on file  Transportation Needs: Not on file  Physical Activity: Not on file  Stress: Not on file  Social Connections: Not on file  Intimate Partner Violence: Not on file    Review of Systems:    Constitutional: No weight loss, fever or chills Cardiovascular: No chest pain  Respiratory: No SOB Gastrointestinal: See HPI and otherwise negative   Physical Exam:  Vital signs: BP 120/78   Pulse 74  Ht 5' (1.524 m)   Wt 106 lb 6 oz (48.3 kg)   SpO2 97%   BMI 20.77 kg/m    Constitutional:   Pleasant Elderly Caucasian female appears to be in NAD, Well developed, Well nourished, alert and cooperative Respiratory: Respirations even and unlabored. Lungs clear to auscultation bilaterally.   No wheezes, crackles, or rhonchi.  Cardiovascular: Normal S1, S2. No MRG. Regular rate and rhythm. No peripheral edema, cyanosis or pallor.  Gastrointestinal:  Soft, nondistended, mild epigastric TTP no rebound or guarding. Normal bowel sounds. No appreciable masses or hepatomegaly. Rectal: External: Question of fissure but nontender on exam; internal: Decreased sphincter tone, no mass; anoscopy: Grade 2 internal hemorrhoid (some question about hemorrhoid, rectal exam was confirmed with Dr. Lorenso Courier at time of visit) Psychiatric: Demonstrates good judgement and reason without abnormal affect or behaviors.  No recent labs.  Assessment: 1.  Fecal incontinence: Incontinence of solid stool, decreased sphincter tone on exam with grade 2 hemorrhoids which are likely all contributing  2.  Grade 2 hemorrhoids: On exam today, likely adding to above 3.  Nausea: Over the past 8 months, no help with Zofran 8 mg, and description it sounds a little more like gastritis/reflux which she does have a history of  Plan: 1.  Discussed with patient that I  feel her nausea but may be more of a gastritis given that she has history of reflux and Zofran did not help at all.  At this time we will try maxing out therapy for gastritis/reflux to see if this helps. 2.  Prescribed Omeprazole 40 mg twice daily, 30-60 minutes before breakfast and again before dinner #60 with 3 refills. 3.  Discussed that if above measures does not help then we can consider EGD for further evaluation. 4.  Discussed fecal incontinence.  Would recommend she stay on her fiber supplement.  We will treat internal hemorrhoid as this is likely making things worse. 4.  Prescribed Hydrocortisone cream 2% to be applied to a Preparation H suppository twice a day for the next 2 weeks. 6.  Discussed the patient has decreased sphincter tone.  I believe she would benefit from pelvic floor therapy.  Sent her for referral. 7.  Discussed that if patient is still having symptoms after the next 1 to 2 months and pelvic floor therapy is unhelpful then could consider further work-up with EGD/colonoscopy.   8.  Patient will follow in clinic with me in 4 to 6 weeks or sooner if necessary.  Ellouise Newer, PA-C [facility] 12/14/2020, 1:19 PM  Cc: Savannah Diaz

## 2020-12-18 ENCOUNTER — Telehealth: Payer: Self-pay | Admitting: Physician Assistant

## 2020-12-18 DIAGNOSIS — R159 Full incontinence of feces: Secondary | ICD-10-CM

## 2020-12-18 NOTE — Telephone Encounter (Signed)
Savannah Diaz it looks like you were wanting this information from pt.

## 2020-12-18 NOTE — Telephone Encounter (Signed)
Patient called requesting to be referred to [place], [phone], as this will be much more convenient for her.  Please call to let her know if this is possible.  Thank you.

## 2020-12-18 NOTE — Telephone Encounter (Signed)
Contacted the number provider, they do accept referral for pelvic floor therapy and gave me the fax number [phone] Attn: Therapy.  Referral faxed.

## 2020-12-22 ENCOUNTER — Other Ambulatory Visit: Payer: Self-pay | Admitting: Physician Assistant

## 2020-12-24 ENCOUNTER — Telehealth: Payer: Self-pay | Admitting: Physician Assistant

## 2020-12-24 NOTE — Telephone Encounter (Signed)
Returned call to patient, pt states that she has had nausea since February of this year. Pt states that she was on antiemetics before but did not have any relief. Patient has been taking Omeprazole 40 mg BID for a little over a week with no relief. Advised patient that it was recommended that she try the increased dose for at least 4 weeks and then an EGD would be considered. Pt states that she is worried about an EGD due to her age. We reviewed anti-reflux diet and discussed a bland diet as well. Pt states that she has been eating very little. She has tried to eat cream of wheat, bananas, etc.  She states that she does not vomit but wakes up nauseous. Pt reports that she is still taking Pepcid 40 mg at bedtime. Pt denies any alcohol, or NSAID's. Pt is aware that Anderson Malta will return to the office tomorrow and will further advise at that time. Pt verbalized understanding and had no concerns at the end of the call.

## 2020-12-24 NOTE — Telephone Encounter (Signed)
Inbound call from pt requesting a call back stating that she is experiecing nausea and stomach cramping. Please advise. Thank you.

## 2020-12-25 NOTE — Telephone Encounter (Signed)
Called patient and gave Savannah Newer PA recommendations

## 2020-12-28 DIAGNOSIS — F4323 Adjustment disorder with mixed anxiety and depressed mood: Secondary | ICD-10-CM | POA: Diagnosis not present

## 2020-12-31 NOTE — Telephone Encounter (Signed)
Returned call to patient, she reports that she has been taking Omeprazole 40 mg BID and Pepcid 40 mg at bedtime for the past 2 weeks. Pt reports that she continues to have nausea and stomach cramping. Pt reports that she also has about 4-5 stools a day with some incontinence, she reports that her stool looks different every time. She states that the stools will defer in color and texture. Pt states that the stools are not watery, sometimes she will see mucous, she reports that she saw a little BRB today but it went away. Pt reports that she takes 2 heaping teaspoons of Metamucil daily. Pt states that she has been sleeping with a wedge and extra pillows but her symptoms seem to slowly get worse. The nausea and cramping is worrying her. She states that the antiemetics do not help. She is not sure how to proceed. Pt states that she doesn't even go out anymore. Please advise, thanks.

## 2020-12-31 NOTE — Telephone Encounter (Signed)
Inbound call from patient states the nausea, stomach and diarrhea is the same is not doing any better. And have other concerns. Appt with Dr. Carlean Purl 11/16. Best contact number [phone]

## 2021-01-01 DIAGNOSIS — R109 Unspecified abdominal pain: Secondary | ICD-10-CM

## 2021-01-01 DIAGNOSIS — R110 Nausea: Secondary | ICD-10-CM

## 2021-01-01 NOTE — Telephone Encounter (Signed)
Called and spoke with patient in regards to Jennifer's recommendations as outlined below. Pt verbalized understanding and had no concerns at the end of the call.

## 2021-01-02 NOTE — Telephone Encounter (Signed)
Savannah Diaz, pt has been scheduled with Dr. Ardis Hughs in the [facility] on Friday, 01/04/21 at 4 pm for an EGD only. Instructions have been sent to patient via my chart.   Ambulatory referral to GI in epic.

## 2021-01-04 ENCOUNTER — Other Ambulatory Visit: Payer: Self-pay

## 2021-01-04 ENCOUNTER — Ambulatory Visit (AMBULATORY_SURGERY_CENTER): Payer: Medicare Other | Admitting: Gastroenterology

## 2021-01-04 ENCOUNTER — Encounter: Payer: Self-pay | Admitting: Gastroenterology

## 2021-01-04 VITALS — BP 132/68 | HR 68 | Temp 98.4°F | Resp 15 | Ht 60.0 in | Wt 106.0 lb

## 2021-01-04 DIAGNOSIS — K2970 Gastritis, unspecified, without bleeding: Secondary | ICD-10-CM | POA: Diagnosis not present

## 2021-01-04 DIAGNOSIS — K3189 Other diseases of stomach and duodenum: Secondary | ICD-10-CM

## 2021-01-04 DIAGNOSIS — K222 Esophageal obstruction: Secondary | ICD-10-CM

## 2021-01-04 DIAGNOSIS — N183 Chronic kidney disease, stage 3 unspecified: Secondary | ICD-10-CM | POA: Diagnosis not present

## 2021-01-04 DIAGNOSIS — K2990 Gastroduodenitis, unspecified, without bleeding: Secondary | ICD-10-CM

## 2021-01-04 DIAGNOSIS — I129 Hypertensive chronic kidney disease with stage 1 through stage 4 chronic kidney disease, or unspecified chronic kidney disease: Secondary | ICD-10-CM | POA: Diagnosis not present

## 2021-01-04 DIAGNOSIS — R110 Nausea: Secondary | ICD-10-CM

## 2021-01-04 DIAGNOSIS — K2950 Unspecified chronic gastritis without bleeding: Secondary | ICD-10-CM | POA: Diagnosis not present

## 2021-01-04 DIAGNOSIS — R1310 Dysphagia, unspecified: Secondary | ICD-10-CM

## 2021-01-04 MED ORDER — SODIUM CHLORIDE 0.9 % IV SOLN: 500.0000 mL | Freq: Once | INTRAVENOUS | Status: DC

## 2021-01-04 NOTE — Progress Notes (Signed)
Report given to PACU, vss 

## 2021-01-04 NOTE — Op Note (Signed)
[facility] Patient Name: Savannah Diaz Procedure Date: 01/04/2021 3:46 PM MRN: [id number] Endoscopist: Milus Banister , MD Age: 87 Referring MD:  Date of Birth: 1932/08/11 Gender: Female Account #: [id number] Procedure:                Upper GI endoscopy Indications:              Nausea for months, getting worse. Intermittent                            dysphagia-like swallowing difficulty for years. Medicines:                Monitored Anesthesia Care Procedure:                Pre-Anesthesia Assessment:                           - Prior to the procedure, a History and Physical                            was performed, and patient medications and                            allergies were reviewed. The patient's tolerance of                            previous anesthesia was also reviewed. The risks                            and benefits of the procedure and the sedation                            options and risks were discussed with the patient.                            All questions were answered, and informed consent                            was obtained. Prior Anticoagulants: The patient has                            taken no previous anticoagulant or antiplatelet                            agents. ASA Grade Assessment: III - A patient with                            severe systemic disease. After reviewing the risks                            and benefits, the patient was deemed in                            satisfactory condition to undergo the procedure.  After obtaining informed consent, the endoscope was                            passed under direct vision. Throughout the                            procedure, the patient's blood pressure, pulse, and                            oxygen saturations were monitored continuously. The                            Endoscope was introduced through the mouth, and                            advanced  to the second part of duodenum. The upper                            GI endoscopy was accomplished without difficulty.                            The patient tolerated the procedure well. Scope In: Scope Out: Findings:                 Proximal esophageal web, I was able to gently                            advance the adult gastroscope through the web with                            minor resistance. This resulted in a superficial                            mucosal disruption and self limited bleeding which                            was noted at scope withdraw.                           Mild inflammation characterized by erythema,                            friability and granularity was found in the gastric                            antrum. Biopsies were taken with a cold forceps for                            histology.                           The exam was otherwise without abnormality. Complications:            No immediate complications. Estimated blood loss:  None. Estimated Blood Loss:     Estimated blood loss: none. Impression:               - Proximal esophageal web, I was able to gently                            advance the adult gastroscope through the web with                            minor resistance. This resulted in a superficial                            mucosal disruption and self limited bleeding which                            was noted at scope withdraw.                           - Non-specific distal gastritis, biopsied to check                            for H. pylori.                           - The examination was otherwise normal. Recommendation:           - Patient has a contact number available for                            emergencies. The signs and symptoms of potential                            delayed complications were discussed with the                            patient. Return to normal activities tomorrow.                             Written discharge instructions were provided to the                            patient.                           - Post dilation diet.                           - Continue present medications.                           - Await pathology results.                           - Continue with plans to see Dr. Carlean Purl next week  in the office. Milus Banister, MD 01/04/2021 4:24:29 PM This report has been signed electronically.

## 2021-01-04 NOTE — Progress Notes (Signed)
VS by [place]  Pt's states no medical or surgical changes since previsit or office visit.  

## 2021-01-04 NOTE — Patient Instructions (Signed)
Follow post dilation diet Continue present medications  Awaiting pathology results Continue with plans to see Dr.Gessner next week.  YOU HAD AN ENDOSCOPIC PROCEDURE TODAY AT [facility]:   Refer to the procedure report that was given to you for any specific questions about what was found during the examination.  If the procedure report does not answer your questions, please call your gastroenterologist to clarify.  If you requested that your care partner not be given the details of your procedure findings, then the procedure report has been included in a sealed envelope for you to review at your convenience later.  YOU SHOULD EXPECT: Some feelings of bloating in the abdomen. Passage of more gas than usual.  Walking can help get rid of the air that was put into your GI tract during the procedure and reduce the bloating. If you had a lower endoscopy (such as a colonoscopy or flexible sigmoidoscopy) you may notice spotting of blood in your stool or on the toilet paper. If you underwent a bowel prep for your procedure, you may not have a normal bowel movement for a few days.  Please Note:  You might notice some irritation and congestion in your nose or some drainage.  This is from the oxygen used during your procedure.  There is no need for concern and it should clear up in a day or so.  SYMPTOMS TO REPORT IMMEDIATELY:  Following upper endoscopy (EGD)  Vomiting of blood or coffee ground material  New chest pain or pain under the shoulder blades  Painful or persistently difficult swallowing  New shortness of breath  Fever of 100F or higher  Black, tarry-looking stools  For urgent or emergent issues, a gastroenterologist can be reached at any hour by calling [phone]. Do not use MyChart messaging for urgent concerns.    DIET:  We do recommend a small meal at first, but then you may proceed to your regular diet.  Drink plenty of fluids but you should avoid alcoholic  beverages for 24 hours.  ACTIVITY:  You should plan to take it easy for the rest of today and you should NOT DRIVE or use heavy machinery until tomorrow (because of the sedation medicines used during the test).    FOLLOW UP: Our staff will call the number listed on your records 48-72 hours following your procedure to check on you and address any questions or concerns that you may have regarding the information given to you following your procedure. If we do not reach you, we will leave a message.  We will attempt to reach you two times.  During this call, we will ask if you have developed any symptoms of COVID 19. If you develop any symptoms (ie: fever, flu-like symptoms, shortness of breath, cough etc.) before then, please call [phone].  If you test positive for Covid 19 in the 2 weeks post procedure, please call and report this information to [place].    If any biopsies were taken you will be contacted by phone or by letter within the next 1-3 weeks.  Please call us at [phone] if you have not heard about the biopsies in 3 weeks.    SIGNATURES/CONFIDENTIALITY: You and/or your care partner have signed paperwork which will be entered into your electronic medical record.  These signatures attest to the fact that that the information above on your After Visit Summary has been reviewed and is understood.  Full responsibility of the confidentiality of this discharge information lies  with you and/or your care-partner.  

## 2021-01-04 NOTE — Progress Notes (Signed)
Called to room to assist during endoscopic procedure.  Patient ID and intended procedure confirmed with present staff. Received instructions for my participation in the procedure from the performing physician.  

## 2021-01-04 NOTE — Progress Notes (Signed)
1608 Robinul 0.1 mg IV given due large amount of secretions upon assessment.  MD made aware, vss  

## 2021-01-04 NOTE — Progress Notes (Signed)
  The recent H&P (dated 12/14/20 by JL) was reviewed, the patient was examined and there is no change in the patients condition since that H&P was completed.   Savannah Diaz  01/04/2021, 3:46 PM

## 2021-01-08 ENCOUNTER — Telehealth: Payer: Self-pay | Admitting: *Deleted

## 2021-01-08 NOTE — Telephone Encounter (Signed)
  Follow up Call-  Call back number 01/04/2021  Post procedure Call Back phone  # [phone]  Permission to leave phone message Yes  Some recent data might be hidden     Patient questions:  Do you have a fever, pain , or abdominal swelling? No. Pain Score  0 *  Have you tolerated food without any problems? Yes.    Have you been able to return to your normal activities? Yes.    Do you have any questions about your discharge instructions: Diet   No. Medications  No. Follow up visit  No.  Do you have questions or concerns about your Care? No.  Actions: * If pain score is 4 or above: No action needed, pain <4.

## 2021-01-09 ENCOUNTER — Encounter: Payer: Self-pay | Admitting: Internal Medicine

## 2021-01-09 ENCOUNTER — Ambulatory Visit (INDEPENDENT_AMBULATORY_CARE_PROVIDER_SITE_OTHER): Payer: Medicare Other | Admitting: Internal Medicine

## 2021-01-09 VITALS — BP 154/74 | HR 72 | Ht 60.0 in | Wt 105.0 lb

## 2021-01-09 DIAGNOSIS — R1013 Epigastric pain: Secondary | ICD-10-CM

## 2021-01-09 DIAGNOSIS — R1312 Dysphagia, oropharyngeal phase: Secondary | ICD-10-CM | POA: Diagnosis not present

## 2021-01-09 DIAGNOSIS — R159 Full incontinence of feces: Secondary | ICD-10-CM | POA: Diagnosis not present
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
## 2021-01-09 NOTE — Patient Instructions (Signed)
We will be in touch once your biopsy results are back.  Hopefully the P.T. your doing will be helpful.  One medicine to ask Dr Toy Care about is Buspirone.   I appreciate the opportunity to care for you. Silvano Rusk, MD, [facility]

## 2021-01-10 ENCOUNTER — Telehealth: Payer: Self-pay

## 2021-01-10 NOTE — Telephone Encounter (Signed)
-----   Message from Gatha Mayer, MD sent at 01/09/2021  5:14 PM EST ----- Regarding: Pathology results Savannah Diaz,  Please contact this lady and let her know that the biopsies from her stomach showed some mild inflammation but no infection.  She was in the office today and it was still pending.  She should ask her psychiatrist about the possibility of trying the buspirone as we discussed.  Savannah Diaz

## 2021-01-10 NOTE — Telephone Encounter (Signed)
-----   Message from Gatha Mayer, MD sent at 01/09/2021  5:14 PM EST ----- Regarding: Pathology results Remo Lipps,  Please contact this lady and let her know that the biopsies from her stomach showed some mild inflammation but no infection.  She was in the office today and it was still pending.  She should ask her psychiatrist about the possibility of trying the buspirone as we discussed.  Corlis Hove

## 2021-01-10 NOTE — Telephone Encounter (Signed)
Pt was notified of results and Dr. Carlean Purl recommendations. Pt verbalized understanding with all questions answered.

## 2021-01-11 DIAGNOSIS — F4323 Adjustment disorder with mixed anxiety and depressed mood: Secondary | ICD-10-CM | POA: Diagnosis not present

## 2021-01-23 DIAGNOSIS — F411 Generalized anxiety disorder: Secondary | ICD-10-CM | POA: Diagnosis not present

## 2021-01-23 DIAGNOSIS — Z79899 Other long term (current) drug therapy: Secondary | ICD-10-CM | POA: Diagnosis not present

## 2021-01-23 DIAGNOSIS — Z0000 Encounter for general adult medical examination without abnormal findings: Secondary | ICD-10-CM | POA: Diagnosis not present

## 2021-01-23 DIAGNOSIS — F5101 Primary insomnia: Secondary | ICD-10-CM | POA: Diagnosis not present

## 2021-01-23 DIAGNOSIS — F325 Major depressive disorder, single episode, in full remission: Secondary | ICD-10-CM | POA: Diagnosis not present

## 2021-01-23 DIAGNOSIS — I129 Hypertensive chronic kidney disease with stage 1 through stage 4 chronic kidney disease, or unspecified chronic kidney disease: Secondary | ICD-10-CM | POA: Diagnosis not present

## 2021-01-23 DIAGNOSIS — N1831 Chronic kidney disease, stage 3a: Secondary | ICD-10-CM | POA: Diagnosis not present

## 2021-01-23 DIAGNOSIS — K219 Gastro-esophageal reflux disease without esophagitis: Secondary | ICD-10-CM | POA: Diagnosis not present

## 2021-03-04 DIAGNOSIS — H25811 Combined forms of age-related cataract, right eye: Secondary | ICD-10-CM | POA: Diagnosis not present

## 2021-03-04 DIAGNOSIS — Z961 Presence of intraocular lens: Secondary | ICD-10-CM | POA: Diagnosis not present

## 2021-03-15 DIAGNOSIS — H25811 Combined forms of age-related cataract, right eye: Secondary | ICD-10-CM | POA: Diagnosis not present

## 2021-03-20 ENCOUNTER — Telehealth: Payer: Self-pay | Admitting: Internal Medicine

## 2021-03-20 NOTE — Telephone Encounter (Signed)
Inbound call from patient states she have been experience nausea a lot, unpredictable bowels, bowel/urine incontinence, often wake up sweating, have an anal tag she would like to discuss as well as if she should continue taking Buspirone. Advise patient she have an appt 2/1

## 2021-03-21 NOTE — Telephone Encounter (Signed)
Pt states that's she wants Dr. Carlean Purl to know whats been going on with her prior to her appointment:Pt states that  has been having nausea for a year, episodes of Incontinence( seeing PT for this pt states); Feeling sick with no Emeses:  Pt reminded of appt with Dr. Carlean Purl 03/27/2021 at 10:50.  Pt was encouraged to write down all her symptoms and questions prior to her appointment:  Pt verbalized understanding with all questions answered.

## 2021-03-22 DIAGNOSIS — F4323 Adjustment disorder with mixed anxiety and depressed mood: Secondary | ICD-10-CM | POA: Diagnosis not present

## 2021-03-27 ENCOUNTER — Ambulatory Visit (INDEPENDENT_AMBULATORY_CARE_PROVIDER_SITE_OTHER): Payer: Medicare Other | Admitting: Internal Medicine

## 2021-03-27 ENCOUNTER — Other Ambulatory Visit (INDEPENDENT_AMBULATORY_CARE_PROVIDER_SITE_OTHER): Payer: Medicare Other

## 2021-03-27 ENCOUNTER — Encounter: Payer: Self-pay | Admitting: Internal Medicine

## 2021-03-27 VITALS — BP 146/76 | HR 62 | Ht 60.0 in | Wt 113.0 lb

## 2021-03-27 DIAGNOSIS — R159 Full incontinence of feces: Secondary | ICD-10-CM | POA: Diagnosis not present

## 2021-03-27 DIAGNOSIS — M792 Neuralgia and neuritis, unspecified: Secondary | ICD-10-CM

## 2021-03-27 DIAGNOSIS — R1013 Epigastric pain: Secondary | ICD-10-CM | POA: Diagnosis not present

## 2021-03-27 DIAGNOSIS — R32 Unspecified urinary incontinence: Secondary | ICD-10-CM | POA: Diagnosis not present

## 2021-03-27 DIAGNOSIS — R110 Nausea: Secondary | ICD-10-CM | POA: Diagnosis not present

## 2021-03-27 DIAGNOSIS — R6889 Other general symptoms and signs: Secondary | ICD-10-CM

## 2021-03-27 LAB — COMPREHENSIVE METABOLIC PANEL
ALT: 16 U/L (ref 0–35)
AST: 14 U/L (ref 0–37)
Albumin: 4.1 g/dL (ref 3.5–5.2)
Alkaline Phosphatase: 44 U/L (ref 39–117)
BUN: 24 mg/dL — ABNORMAL HIGH (ref 6–23)
CO2: 32 mEq/L (ref 19–32)
Calcium: 9.5 mg/dL (ref 8.4–10.5)
Chloride: 104 mEq/L (ref 96–112)
Creatinine, Ser: 0.92 mg/dL (ref 0.40–1.20)
GFR: 56.47 mL/min — ABNORMAL LOW (ref >60.00)
Glucose, Bld: 107 mg/dL — ABNORMAL HIGH (ref 70–99)
Potassium: 3.7 mEq/L (ref 3.5–5.1)
Sodium: 142 mEq/L (ref 135–145)
Total Bilirubin: 0.7 mg/dL (ref 0.2–1.2)
Total Protein: 6.7 g/dL (ref 6.0–8.3)

## 2021-03-27 LAB — CBC WITH DIFFERENTIAL/PLATELET
Basophils Absolute: 0.1 10*3/uL (ref 0.0–0.1)
Basophils Relative: 0.8 % (ref 0.0–3.0)
Eosinophils Absolute: 0.1 10*3/uL (ref 0.0–0.7)
Eosinophils Relative: 1.8 % (ref 0.0–5.0)
HCT: 38.6 % (ref 36.0–46.0)
Hemoglobin: 12.9 g/dL (ref 12.0–15.0)
Lymphocytes Relative: 16.6 % (ref 12.0–46.0)
Lymphs Abs: 1.3 10*3/uL (ref 0.7–4.0)
MCHC: 33.4 g/dL (ref 30.0–36.0)
MCV: 88.3 fl (ref 78.0–100.0)
Monocytes Absolute: 0.5 10*3/uL (ref 0.1–1.0)
Monocytes Relative: 5.7 % (ref 3.0–12.0)
Neutro Abs: 6 10*3/uL (ref 1.4–7.7)
Neutrophils Relative %: 75.1 % (ref 43.0–77.0)
Platelets: 251 10*3/uL (ref 150.0–400.0)
RBC: 4.37 Mil/uL (ref 3.87–5.11)
RDW: 13.6 % (ref 11.5–15.5)
WBC: 8 10*3/uL (ref 4.0–10.5)

## 2021-03-27 LAB — VITAMIN B12: Vitamin B-12: 244 pg/mL (ref 211–911)

## 2021-03-27 NOTE — Progress Notes (Signed)
Savannah Diaz 88 y.o. Mar 11, 1932 [id number]  Assessment & Plan:   Encounter Diagnoses  Name Primary?   Dyspepsia Yes   Incontinence of feces, unspecified fecal incontinence type    Neuropathic pain syndrome (non-herpetic)    Chronic nausea    Urinary and fecal incontinence    Other general symptoms and signs    Lab evaluation as below Orders Placed This Encounter  Procedures   CBC with Differential/Platelet   Comprehensive metabolic panel   Vitamin Y63   Pending this we will consider referral to neurology for the patient's complaints of peripheral neuropathy symptoms.  I had intended to defer this to primary care but the patient is very concerned and worried and it seems reasonable unless there is a B12 deficiency to do so.  I will look into pelvic floor physical therapy with biofeedback and make a referral.    I think it should be considered to increase mirtazapine and possibly the BuSpar to help with this chronic nausea that I do not think is gastrointestinal in origin.  She is to ask her psychiatrist.  Regarding investigation for colon cancer that she is worried about, there are no present signs or symptoms that would warrant a colonoscopy in my opinion and though it is understandably unsettling that her brother in his 70s recently had surgery for colon cancer that is not enough to undergo a colonoscopy for her, I think.  I would not do stool Hemoccult testing either.  CC: Savannah Manes, MD   Subjective:   Chief Complaint: Multiple but mainly nausea and some change in bowel habits.  HPI The patient is an 88 year old white woman who presents with her occupational therapist from [facility] [place] today, and says that her main issue is constant nausea.  She had an EGD that showed some non-H. pylori gastritis late 2022, and I had recommended she go on buspirone which her psychiatrist prescribed at 5 mg twice daily.  She has not noted much difference regarding nausea and  dyspepsia.  Recall that she has chronic esophageal dysmotility issues as well.  She has been eating many foods cooking for herself as she does not like the food at [facility].  She is gaining weight as can be seen below.  She has a long list of other questions and concerns many of which that are not gastrointestinal in origin.  She does still suffer with some incontinence and occupational therapist has been working with her but is requesting we look into pelvic PT with biofeedback.  Urinary and fecal incontinence mostly fecal.  It is improved but still a problem.  Metamucil was started recently.  Bowel movements are "ropelike".  Not sure when last colonoscopy was, she reports she is "very concerned about possible colon cancer".  CT scan in May of last year abdomen and pelvis with diverticulosis no bowel lesions.  To the best of my knowledge based upon lab review through the [facility] sheet she has had a normal hemoglobin as late as November of last year.  She had an elderly brother that had to have surgery for colon cancer within the last year and this has spooked her.  She is not having any rectal bleeding.  She has some chronically irregular bowel habits.  Asking about doing stool Hemoccults.   She is also complaining of numbness and tingling-like sensations and burning sensation in her feet and thinks she has peripheral neuropathy.  Its not clear that this is a formal diagnosis.  She is  not sure if she has had a B12 level checked.   The last couple of years have been difficult she became a widow she moved here from [place], moved into [facility].  Anxiety is up.  She is tired of feeling unwell.  She had been tapering her benzodiazepine but is now requesting an increase in that.    Wt Readings from Last 3 Encounters:  03/27/21 113 lb (51.3 kg)  01/09/21 105 lb (47.6 kg)  01/04/21 106 lb (48.1 kg)    Allergies  Allergen Reactions   Lactose Intolerance (Gi) Other (See  Comments)    Gi tract issues   Lactulose Other (See Comments)    Gi tract issues   Sulfa Antibiotics Hives    Childhood allergy    Current Meds  Medication Sig   amLODipine (NORVASC) 10 MG tablet Take 10 mg by mouth daily.   atenolol (TENORMIN) 50 MG tablet Take 50 mg by mouth daily.   famotidine (PEPCID) 40 MG tablet Take 40 mg by mouth at bedtime.   hydrocortisone cream 1 % Apply 1 application topically daily. For dry skin on sides of mouth   LORazepam (ATIVAN) 0.5 MG tablet Take 0.5-1 mg by mouth See admin instructions. Take 1/2 tablet in the morning, 1 tablets (0.5) in the afternoon, and 1/2 tablets (0.25) at night   mirtazapine (REMERON SOL-TAB) 15 MG disintegrating tablet Take 15 mg by mouth at bedtime.    sertraline (ZOLOFT) 20 MG/ML concentrated solution Take by mouth daily.   traZODone (DESYREL) 50 MG tablet Take 175 mg by mouth at bedtime.   Past Medical History:  Diagnosis Date   Anxiety    Arthritis    "qwhere; but it doesn't hurt" (05/12/2012)   Aspiration pneumonia ([place]) 03/21/2016   Cataract    left   Compression fracture of first lumbar vertebra (HCC) 07/2016   superior endplate   Depression    Dysphagia    "have had it for 7 yr; got worse 4 days ago" (05/12/2012)   Dysphagia, pharyngoesophageal phase - suspected 05/10/2012   GERD (gastroesophageal reflux disease)    Hypertension    Insomnia    Lumbago with sciatica, right side    Migraines    "menopause cured them" (05/12/2012)   Osteoporosis    Stage 3 chronic kidney disease ([place])    Uterine prolapse    Past Surgical History:  Procedure Laterality Date   CATARACT EXTRACTION W/ INTRAOCULAR LENS IMPLANT Left ? 2010   ESOPHAGEAL MANOMETRY N/A 11/08/2018   Procedure: ESOPHAGEAL MANOMETRY (EM);  Surgeon: Gatha Mayer, MD;  Location: [facility];  Service: Endoscopy;  Laterality: N/A;   ESOPHAGOGASTRODUODENOSCOPY N/A 05/10/2012   Procedure: ESOPHAGOGASTRODUODENOSCOPY (EGD);  Surgeon: Jerene Bears, MD;   Location: [facility];  Service: Gastroenterology;  Laterality: N/A;   TONSILLECTOMY  1940's   Social History   Social History Narrative   Widowed, retired, 2 daughters husband was a Mining engineer at [employer]   Her daughters and son-in-law's are all philosophy professors to it [employer] and [employer], daughter here at [employer] son-in-law at [place] at [employer] [facility] [employer] [facility]   No Etoh/tobacco       family history includes Arthritis in her mother; Colon cancer in her father; Dementia in her mother; Hypertension in her mother; Stomach cancer in her daughter.   Review of Systems See HPI  Objective:   Physical Exam BP (!) 146/76    Pulse 62  Ht 5' (1.524 m)    Wt 113 lb (51.3 kg)    BMI 22.07 kg/m  Thin elderly white woman no acute distress She is alert and oriented x3 and has an appropriate mood and affect The abdomen is soft and nontender without organomegaly or mass

## 2021-03-27 NOTE — Patient Instructions (Signed)
Your provider has requested that you go to the basement level for lab work before leaving today. Press "B" on the elevator. The lab is located at the first door on the left as you exit the elevator.  Due to recent changes in healthcare laws, you may see the results of your imaging and laboratory studies on MyChart before your provider has had a chance to review them.  We understand that in some cases there may be results that are confusing or concerning to you. Not all laboratory results come back in the same time frame and the provider may be waiting for multiple results in order to interpret others.  Please give [place] 48 hours in order for your provider to thoroughly review all the results before contacting the office for clarification of your results.   We're going to look into getting you physical therapy.  Ask your psychiatrist about increasing your mirtazapine to 30mg  for your nausea.   I appreciate the opportunity to care for you. Silvano Rusk, MD, [facility]

## 2021-03-28 ENCOUNTER — Other Ambulatory Visit: Payer: Self-pay

## 2021-03-28 ENCOUNTER — Encounter: Payer: Self-pay | Admitting: Neurology

## 2021-03-28 DIAGNOSIS — E538 Deficiency of other specified B group vitamins: Secondary | ICD-10-CM

## 2021-03-28 DIAGNOSIS — R159 Full incontinence of feces: Secondary | ICD-10-CM

## 2021-03-28 DIAGNOSIS — R32 Unspecified urinary incontinence: Secondary | ICD-10-CM

## 2021-03-28 DIAGNOSIS — G609 Hereditary and idiopathic neuropathy, unspecified: Secondary | ICD-10-CM

## 2021-03-28 MED ORDER — VITAMIN B-12 1000 MCG PO TABS: 1000.0000 ug | ORAL_TABLET | Freq: Every day | ORAL | Status: DC

## 2021-03-29 DIAGNOSIS — F4323 Adjustment disorder with mixed anxiety and depressed mood: Secondary | ICD-10-CM | POA: Diagnosis not present

## 2021-03-30 ENCOUNTER — Encounter: Payer: Self-pay | Admitting: Internal Medicine

## 2021-04-05 DIAGNOSIS — F4323 Adjustment disorder with mixed anxiety and depressed mood: Secondary | ICD-10-CM | POA: Diagnosis not present

## 2021-04-12 DIAGNOSIS — F4323 Adjustment disorder with mixed anxiety and depressed mood: Secondary | ICD-10-CM | POA: Diagnosis not present

## 2021-04-17 ENCOUNTER — Other Ambulatory Visit: Payer: Self-pay

## 2021-04-17 ENCOUNTER — Ambulatory Visit: Payer: Medicare Other | Attending: Internal Medicine

## 2021-04-17 DIAGNOSIS — R32 Unspecified urinary incontinence: Secondary | ICD-10-CM | POA: Diagnosis not present

## 2021-04-17 DIAGNOSIS — M6281 Muscle weakness (generalized): Secondary | ICD-10-CM | POA: Diagnosis not present

## 2021-04-17 DIAGNOSIS — R159 Full incontinence of feces: Secondary | ICD-10-CM | POA: Insufficient documentation

## 2021-04-17 DIAGNOSIS — R279 Unspecified lack of coordination: Secondary | ICD-10-CM | POA: Insufficient documentation

## 2021-04-17 NOTE — Patient Instructions (Signed)
Squatty potty: When your knees are level or below the level of your hips, pelvic floor muscles are pressed against rectum, preventing ease of bowel movement. By getting knees above the level of the hips, these pelvic floor muscles relax, allowing easier passage of bowel movement. ? Ways to get knees above hips: o Squatty Potty (7inch and 9inch versions) o Small stool o Roll of toilet paper under each foot o Hardback book or stack of magazines under each foot  Relaxed Toileting mechanics: Once in this position, make sure to lean forward with forearms on thighs, wide knees, relaxed stomach, and breathe.    Bowel massage: To assist with more regular and more comfortable bowel movements, try performing bowel massage nightly for 5-10 minutes. Place hands in the lower right side of your abdomen to start; in small circles, massage up, across, and down the left side of your abdomen. Pressure does not need to be hard, but just comfortable. You can use lotion or oil to make more comfortable.  

## 2021-04-17 NOTE — Therapy (Signed)
OUTPATIENT PHYSICAL THERAPY FEMALE PELVIC EVALUATION   Patient Name: Savannah Diaz MRN: [id number] DOB:06/14/1932, 88 y.o., female Today's Date: 04/17/2021   PT End of Session - 04/17/21 1705     Visit Number 1    Date for PT Re-Evaluation 06/26/21    Authorization Type Medicare    Progress Note Due on Visit 10    PT Start Time 1446    PT Stop Time 1531    PT Time Calculation (min) 45 min    Activity Tolerance Patient tolerated treatment well    Behavior During Therapy [facility] for tasks assessed/performed             Past Medical History:  Diagnosis Date   Anxiety    Arthritis    "qwhere; but it doesn't hurt" (05/12/2012)   Aspiration pneumonia ([place]) 03/21/2016   Cataract    left   Compression fracture of first lumbar vertebra ([place]) 07/2016   superior endplate   Depression    Dysphagia    "have had it for 7 yr; got worse 4 days ago" (05/12/2012)   Dysphagia, pharyngoesophageal phase - suspected 05/10/2012   GERD (gastroesophageal reflux disease)    Hypertension    Insomnia    Lumbago with sciatica, right side    Migraines    "menopause cured them" (05/12/2012)   Osteoporosis    Stage 3 chronic kidney disease ([place])    Uterine prolapse    Past Surgical History:  Procedure Laterality Date   CATARACT EXTRACTION W/ INTRAOCULAR LENS IMPLANT Left ? 2010   ESOPHAGEAL MANOMETRY N/A 11/08/2018   Procedure: ESOPHAGEAL MANOMETRY (EM);  Surgeon: Gatha Mayer, MD;  Location: [facility];  Service: Endoscopy;  Laterality: N/A;   ESOPHAGOGASTRODUODENOSCOPY N/A 05/10/2012   Procedure: ESOPHAGOGASTRODUODENOSCOPY (EGD);  Surgeon: Jerene Bears, MD;  Location: [facility];  Service: Gastroenterology;  Laterality: N/A;   TONSILLECTOMY  1940's   Patient Active Problem List   Diagnosis Date Noted   High risk medication use 08/08/2016   UTI (urinary tract infection) 03/22/2016   Influenza A 03/22/2016   Fall 03/21/2016   Acute on chronic kidney failure II-III 03/21/2016    Transient alteration of awareness 05/10/2015   Hypokalemia 05/12/2012   Dysphagia oropharyngeal and pharyngeal 05/10/2012   Hypertension 04/28/2011   Osteoarthritis 04/28/2011   Agoraphobia 04/28/2011    PCP: [name], MD  REFERRING PROVIDER: Gatha Mayer, MD  REFERRING DIAG: R32,R15.9 (ICD-10-CM) - Urinary and fecal incontinence   THERAPY DIAG:  Muscle weakness (generalized)  Unspecified lack of coordination  ONSET DATE: 03/27/2020  SUBJECTIVE:  SUBJECTIVE STATEMENT: Pt states that her incontinence is really the least of her concerns due to neuropathy and severe nausea. She will have 4 bowel movements each morning, small amounts each time. She will have fecal incontinence between bowel movements when she is trying to rush to the bathroom for another bowel movement. She cannot recall any situations in which she leaks urine, other than sometimes when she wakes up and can't get to the bathroom in time. Fluid intake: Yes: not sure about how much water intake    Patient confirms identification and approves PT to assess pelvic floor and treatment Yes  PERTINENT HISTORY:  Chronic nausea, bil neuropathy, anxiety  PAIN:  Are you having pain? No  BOWEL MOVEMENT Pain with bowel movement: No Type of bowel movement:Frequency 4x/day Fully empty rectum: No Leakage: Yes: trying to make it to the bathroom for subsequent trips with urgency - reports no control Pads: No Fiber supplement: Yes: Metamucil  URINATION Pain with urination: No Fully empty bladder: No, has to empty shortly after sometimes Stream: Strong Urgency: No Frequency: Depends on if she is nervous or distracted, she is able to make it 2 hours sometimes, but also feels like she is not drinking enough Leakage:  Sometimes from getting  out of bed to the toilet Pads: No  INTERCOURSE Pain with intercourse:  not sexually active  PREGNANCY Vaginal deliveries 2 Tearing Yes: - C-section deliveries 0 Currently pregnant No  PRECAUTIONS: None  WEIGHT BEARING RESTRICTIONS No  FALLS:  Has patient fallen in last 6 months? No, Number of falls: 0  LIVING ENVIRONMENT: Lives with: lives with their family and lives in an assisted living facility Lives in: House/apartment Stairs: No;  Has following equipment at home: None  OCCUPATION: reitred  PLOF: Independent  PATIENT GOALS To have normal bowel movements without fecal incontinence   OBJECTIVE:    COGNITION:  Overall cognitive status: Within functional limits for tasks assessed      PALPATION: Internal Pelvic Floor NA  External Perineal Exam: Over clothes - pelvic floor contraction not present - pt gripping with abdominals and glutes with hip raise off mat when attempting pelvic floor contraction  GENERAL No abdominal tension or tenderness palpated; sternocostal rib angle >90 degrees with moderate mobility with diaphragmatic breathing, but very effortful  POSTURE: rounded shoulders/forward head, thoraic kyphosis, posterior pelvic tilt    PELVIC MMT:   MMT  04/17/2021  Vaginal   Internal Anal Sphincter   External Anal Sphincter   Puborectalis   Diastasis Recti   (Blank rows = not tested)   TODAY'S TREATMENT  EVAL Initial treatment included use of squatty potty, relaxed toileting mechanics, and self-bowel massage. Discussed future treatment sessions, including rectal/vaginal exam.    PATIENT EDUCATION:  Education details: Pt education performed on squatty potty, relaxed toileting mechanics, and self-bowel massage. Person educated: Patient Education method: Explanation, Demonstration, Tactile cues, Verbal cues, and Handouts Education comprehension: verbalized understanding   HOME EXERCISE PROGRAM: Handout provided.   ASSESSMENT:  CLINICAL  IMPRESSION: Patient is a 88 y.o. female who was seen today for physical therapy evaluation and treatment for fecal incontinence and difficulty with inconsistent and frequent bowel movements. Exam finding limited due to extensive subjective history, but did include difficulty with appropriate pelvic floor contraction coordination and abnormal posture. Initial treatment included education on future treatment sessions, performance of internal rectal/vaginal exams to gather more information about condition, use of squatty potty, relaxed toileting mechanics, and self-bowel massage. She will benefit from skilled PT intervention in  order to decrease fecal incontinence, improve complete bowel evacuation/decrease frequency, and improve QOL.    OBJECTIVE IMPAIRMENTS decreased activity tolerance, decreased coordination, decreased endurance, decreased mobility, decreased ROM, decreased strength, hypomobility, increased fascial restrictions, increased muscle spasms, impaired flexibility, postural dysfunction, and pain.   ACTIVITY LIMITATIONS community activity.   PERSONAL FACTORS 1-2 comorbidities: 2 vaginal deliveries, chronic nausea, peripheral neuropathy  are also affecting patient's functional outcome.    REHAB POTENTIAL: Fair  CLINICAL DECISION MAKING: Stable/uncomplicated  EVALUATION COMPLEXITY: Low   GOALS: Goals reviewed with patient? Yes  SHORT TERM GOALS:  STG Name Target Date Goal status  1 Pt will be independent with HEP.  Baseline:  05/15/2021 INITIAL  2 Pt will be able to teach back and utilize urge suppression technique in order to help reduce fecal incontinence.   Baseline:  05/15/2021 INITIAL  3 Pt will be able to correctly perform diaphragmatic breathing and appropriate pressure management in order to prevent worsening vaginal wall laxity and improve pelvic floor A/ROM.  Baseline: 05/15/2021 INITIAL  4 Pt will be independent with use of squatty potty, relaxed toileting mechanics, and  improved bowel movement techniques in order to increase ease of bowel movements and complete evacuation.  Baseline: 05/15/2021 INITIAL                  LONG TERM GOALS:   LTG Name Target Date Goal status  1 Pt will be independent with advanced HEP.  Baseline: 07/16/2021 INITIAL  2 Pt will demonstrate normal pelvic floor muscle tone and A/ROM, able to achieve 4/5 strength with contractions and 10 sec endurance, in order to provide appropriate lumbopelvic support in functional activities.  Baseline: 06/26/2021 INITIAL  3 Pt will report decrease in frequency of bowel movement to 1-2x/day with more complete evacuation. Baseline: 06/26/2021 INITIAL  4 Pt will report no episodes of urinary or fecal incontinence in order to improve confidence in community activities and personal hygiene. Baseline: 06/26/2021 INITIAL                  PLAN: PT FREQUENCY: 1x/week  PT DURATION: 10 weeks  PLANNED INTERVENTIONS: Therapeutic exercises, Therapeutic activity, Neuro Muscular re-education, Balance training, Gait training, Patient/Family education, Joint mobilization, and Dry Needling  PLAN FOR NEXT SESSION: Plan to perform rectal/vaginal assessment to gather more information about condition; consider strict bowel retraining; discuss fiber, diet, and water intake; strongly consider bowel/bladder journal to get a better idea of habits.    Jule Economy, PT 04/17/2021, 5:07 PM

## 2021-04-19 DIAGNOSIS — F4323 Adjustment disorder with mixed anxiety and depressed mood: Secondary | ICD-10-CM | POA: Diagnosis not present

## 2021-04-24 ENCOUNTER — Ambulatory Visit: Payer: Medicare Other | Attending: Internal Medicine

## 2021-04-24 ENCOUNTER — Other Ambulatory Visit: Payer: Self-pay

## 2021-04-24 DIAGNOSIS — M6281 Muscle weakness (generalized): Secondary | ICD-10-CM | POA: Insufficient documentation

## 2021-04-24 DIAGNOSIS — R279 Unspecified lack of coordination: Secondary | ICD-10-CM | POA: Insufficient documentation

## 2021-04-24 NOTE — Patient Instructions (Signed)
Access Code: [email or web address] ?URL: [email or web address] ?Date: 04/24/2021 ?Prepared by: Heather Roberts ? ?Exercises ?Supine Pelvic Floor Contraction - 2-3 x daily - 7 x weekly - 1 sets - 10 reps - 10 hold ?Quick Flick Pelvic Floor Contractions in Hooklying - 2-3 x daily - 7 x weekly - 2 sets - 10 reps ? ?

## 2021-04-24 NOTE — Therapy (Addendum)
OUTPATIENT PHYSICAL THERAPY TREATMENT NOTE   Patient Name: Savannah Diaz MRN: [id number] DOB:25-Feb-1932, 88 y.o., female Today's Date: 04/24/2021  PCP: [name], MD REFERRING PROVIDER: Gatha Mayer, MD   PT End of Session - 04/24/21 1333     Visit Number 2    Date for PT Re-Evaluation 06/26/21    Authorization Type Medicare    Progress Note Due on Visit 10    PT Start Time 1231    PT Stop Time 1313    PT Time Calculation (min) 42 min    Activity Tolerance Patient tolerated treatment well    Behavior During Therapy WFL for tasks assessed/performed             Past Medical History:  Diagnosis Date   Anxiety    Arthritis    "qwhere; but it doesn't hurt" (05/12/2012)   Aspiration pneumonia ([place]) 03/21/2016   Cataract    left   Compression fracture of first lumbar vertebra ([place]) 07/2016   superior endplate   Depression    Dysphagia    "have had it for 7 yr; got worse 4 days ago" (05/12/2012)   Dysphagia, pharyngoesophageal phase - suspected 05/10/2012   GERD (gastroesophageal reflux disease)    Hypertension    Insomnia    Lumbago with sciatica, right side    Migraines    "menopause cured them" (05/12/2012)   Osteoporosis    Stage 3 chronic kidney disease ([place])    Uterine prolapse    Past Surgical History:  Procedure Laterality Date   CATARACT EXTRACTION W/ INTRAOCULAR LENS IMPLANT Left ? 2010   ESOPHAGEAL MANOMETRY N/A 11/08/2018   Procedure: ESOPHAGEAL MANOMETRY (EM);  Surgeon: Gatha Mayer, MD;  Location: [facility];  Service: Endoscopy;  Laterality: N/A;   ESOPHAGOGASTRODUODENOSCOPY N/A 05/10/2012   Procedure: ESOPHAGOGASTRODUODENOSCOPY (EGD);  Surgeon: Jerene Bears, MD;  Location: [facility];  Service: Gastroenterology;  Laterality: N/A;   TONSILLECTOMY  1940's   Patient Active Problem List   Diagnosis Date Noted   High risk medication use 08/08/2016   UTI (urinary tract infection) 03/22/2016   Influenza A 03/22/2016   Fall 03/21/2016    Acute on chronic kidney failure II-III 03/21/2016   Transient alteration of awareness 05/10/2015   Hypokalemia 05/12/2012   Dysphagia oropharyngeal and pharyngeal 05/10/2012   Hypertension 04/28/2011   Osteoarthritis 04/28/2011   Agoraphobia 04/28/2011    REFERRING DIAG: R32,R15.9 (ICD-10-CM) - Urinary and fecal incontinence   THERAPY DIAG:  Muscle weakness (generalized)  Unspecified lack of coordination  PERTINENT HISTORY: Chronic nausea, Bil neuropathy, anxiety  PRECAUTIONS: None  SUBJECTIVE: Pt denies any changes since last visit. She has gotten a Physiological scientist and she feels like sit does help her empty. She states that it is hard to tell is she needs to have less bowel movements with incorporating these things because each day is not the same.   PAIN:  Are you having pain? No     BOWEL MOVEMENT Pain with bowel movement: No Type of bowel movement:Frequency 4x/day Fully empty rectum: No Leakage: Yes: trying to make it to the bathroom for subsequent trips with urgency - reports no control Pads: No Fiber supplement: Yes: Metamucil   URINATION Pain with urination: No Fully empty bladder: No, has to empty shortly after sometimes Stream: Strong Urgency: No Frequency: Depends on if she is nervous or distracted, she is able to make it 2 hours sometimes, but also feels like she is not drinking enough Leakage:  Sometimes from  getting out of bed to the toilet Pads: No   INTERCOURSE Pain with intercourse:  not sexually active   PREGNANCY Vaginal deliveries 2 Tearing Yes: - C-section deliveries 0 Currently pregnant No   PRECAUTIONS: None   WEIGHT BEARING RESTRICTIONS No   FALLS:  Has patient fallen in last 6 months? No, Number of falls: 0   LIVING ENVIRONMENT: Lives with: lives with their family and lives in an assisted living facility Lives in: House/apartment Stairs: No;  Has following equipment at home: None   OCCUPATION: reitred   PLOF: Independent    PATIENT GOALS To have normal bowel movements without fecal incontinence     OBJECTIVE:      COGNITION:            Overall cognitive status: Within functional limits for tasks assessed                          Exam 04/24/21  PALPATION: Internal Pelvic Floor No tenderness to palpation   External Perineal Exam: White/pale plaques noted on Lt labia minora (2 o'clock) and Bil labia minor 4-8 o'clock   GENERAL No abdominal tension or tenderness palpated; sternocostal rib angle >90 degrees with moderate mobility with diaphragmatic breathing, but very effortful   POSTURE: rounded shoulders/forward head, thoracic kyphosis, posterior pelvic tilt       PELVIC MMT:   MMT   04/17/2021  Vaginal 2/5, endurance 4 seconds, reps 3   Internal Anal Sphincter  2/5  External Anal Sphincter  2/5  Puborectalis  2/5  Diastasis Recti    (Blank rows = not tested)     04/24/2021 Treatment: Pt education performed on the importance of incorporating fiber and vegetables into diet. We talked about how fruit is great for you, but maybe the sugar is bothering her stomach without enough fiber. We discussed trying to stop doing as many smoothies/processing all of her meals. We also discussed drinking 3-4 glasses of water a day.  Pelvic floor contraction training with internal vaginal/rectal feedback: 2 x 10 quick flicks with breath coordination 10 x 10 seconds with cues for re-contraction  04/17/21 TREATMENT  EVAL Initial treatment included use of squatty potty, relaxed toileting mechanics, and self-bowel massage. Discussed future treatment sessions, including rectal/vaginal exam.      PATIENT EDUCATION:  Education details: HEP for pelvic floor strengthening Person educated: Patient Education method: Explanation, Demonstration, Tactile cues, Verbal cues, and Handouts Education comprehension: verbalized understanding     HOME EXERCISE PROGRAM: Handout provided.    ASSESSMENT:   CLINICAL  IMPRESSION: Discussed at length dietary changes and trying to eat more of her food instead of blending everything; she also agrees to add water up to 3-4 glasses a day. She is going to take metamucil 2x/day per instructions on the bottle. She demonstrated some external skin changes that may be consistent with lichens sclerosis - will reach out to MD to see if a gynecological referral is possible. She also demonstrated pelvic floor weakness of 2/5, endurance 4 seconds, and decreased coordination with multiple contractions. She did well with contraction training with multimodal cues. She will continue to benefit from skilled PT intervention in order to decrease urinary/fecal incontinence and help improve bowel evacuation.      OBJECTIVE IMPAIRMENTS decreased activity tolerance, decreased coordination, decreased endurance, decreased mobility, decreased ROM, decreased strength, hypomobility, increased fascial restrictions, increased muscle spasms, impaired flexibility, postural dysfunction, and pain.    ACTIVITY LIMITATIONS community activity.  PERSONAL FACTORS 1-2 comorbidities: 2 vaginal deliveries, chronic nausea, peripheral neuropathy  are also affecting patient's functional outcome.      REHAB POTENTIAL: Fair   CLINICAL DECISION MAKING: Stable/uncomplicated   EVALUATION COMPLEXITY: Low     GOALS: Goals reviewed with patient? Yes   SHORT TERM GOALS:   STG Name Target Date Goal status  1 Pt will be independent with HEP.  Baseline:  05/15/2021 INITIAL  2 Pt will be able to teach back and utilize urge suppression technique in order to help reduce fecal incontinence.   Baseline:  05/15/2021 INITIAL  3 Pt will be able to correctly perform diaphragmatic breathing and appropriate pressure management in order to prevent worsening vaginal wall laxity and improve pelvic floor A/ROM.  Baseline: 05/15/2021 INITIAL  4 Pt will be independent with use of squatty potty, relaxed toileting mechanics, and  improved bowel movement techniques in order to increase ease of bowel movements and complete evacuation.  Baseline: 05/15/2021 INITIAL                               LONG TERM GOALS:    LTG Name Target Date Goal status  1 Pt will be independent with advanced HEP.  Baseline: 07/16/2021 INITIAL  2 Pt will demonstrate normal pelvic floor muscle tone and A/ROM, able to achieve 4/5 strength with contractions and 10 sec endurance, in order to provide appropriate lumbopelvic support in functional activities.  Baseline: 06/26/2021 INITIAL  3 Pt will report decrease in frequency of bowel movement to 1-2x/day with more complete evacuation. Baseline: 06/26/2021 INITIAL  4 Pt will report no episodes of urinary or fecal incontinence in order to improve confidence in community activities and personal hygiene. Baseline: 06/26/2021 INITIAL                               PLAN: PT FREQUENCY: 1x/week   PT DURATION: 10 weeks   PLANNED INTERVENTIONS: Therapeutic exercises, Therapeutic activity, Neuro Muscular re-education, Balance training, Gait training, Patient/Family education, Joint mobilization, and Dry Needling   PLAN FOR NEXT SESSION: Progress pelvic floor strengthening to include seated and standing positions; begin core/hip strengthening and mobility exercises.    Heather Roberts, PT, DPT03/01/231:35 PM

## 2021-04-26 DIAGNOSIS — F4323 Adjustment disorder with mixed anxiety and depressed mood: Secondary | ICD-10-CM | POA: Diagnosis not present

## 2021-05-02 ENCOUNTER — Ambulatory Visit: Payer: Medicare Other

## 2021-05-02 ENCOUNTER — Other Ambulatory Visit: Payer: Self-pay

## 2021-05-02 DIAGNOSIS — M6281 Muscle weakness (generalized): Secondary | ICD-10-CM

## 2021-05-02 DIAGNOSIS — R279 Unspecified lack of coordination: Secondary | ICD-10-CM | POA: Diagnosis not present

## 2021-05-02 NOTE — Therapy (Signed)
OUTPATIENT PHYSICAL THERAPY TREATMENT NOTE   Patient Name: Savannah Diaz MRN: [id number] DOB:Jan 15, 1933, 88 y.o., female Today's Date: 05/02/2021  PCP: [name], MD REFERRING PROVIDER: [name], MD   PT End of Session - 05/02/21 1358     Visit Number 3    Date for PT Re-Evaluation 06/26/21    Authorization Type Medicare    Progress Note Due on Visit 10    PT Start Time 1400    PT Stop Time 1442    PT Time Calculation (min) 42 min    Activity Tolerance Patient tolerated treatment well    Behavior During Therapy WFL for tasks assessed/performed             Past Medical History:  Diagnosis Date   Anxiety    Arthritis    "qwhere; but it doesn't hurt" (05/12/2012)   Aspiration pneumonia ([place]) 03/21/2016   Cataract    left   Compression fracture of first lumbar vertebra ([place]) 07/2016   superior endplate   Depression    Dysphagia    "have had it for 7 yr; got worse 4 days ago" (05/12/2012)   Dysphagia, pharyngoesophageal phase - suspected 05/10/2012   GERD (gastroesophageal reflux disease)    Hypertension    Insomnia    Lumbago with sciatica, right side    Migraines    "menopause cured them" (05/12/2012)   Osteoporosis    Stage 3 chronic kidney disease ([place])    Uterine prolapse    Past Surgical History:  Procedure Laterality Date   CATARACT EXTRACTION W/ INTRAOCULAR LENS IMPLANT Left ? 2010   ESOPHAGEAL MANOMETRY N/A 11/08/2018   Procedure: ESOPHAGEAL MANOMETRY (EM);  Surgeon: Gatha Mayer, MD;  Location: [facility];  Service: Endoscopy;  Laterality: N/A;   ESOPHAGOGASTRODUODENOSCOPY N/A 05/10/2012   Procedure: ESOPHAGOGASTRODUODENOSCOPY (EGD);  Surgeon: Jerene Bears, MD;  Location: [facility];  Service: Gastroenterology;  Laterality: N/A;   TONSILLECTOMY  1940's   Patient Active Problem List   Diagnosis Date Noted   High risk medication use 08/08/2016   UTI (urinary tract infection) 03/22/2016   Influenza A 03/22/2016   Fall 03/21/2016    Acute on chronic kidney failure II-III 03/21/2016   Transient alteration of awareness 05/10/2015   Hypokalemia 05/12/2012   Dysphagia oropharyngeal and pharyngeal 05/10/2012   Hypertension 04/28/2011   Osteoarthritis 04/28/2011   Agoraphobia 04/28/2011    REFERRING DIAG: R32,R15.9 (ICD-10-CM) - Urinary and fecal incontinence   THERAPY DIAG:  Muscle weakness (generalized)  Unspecified lack of coordination  PERTINENT HISTORY: Chronic nausea, Bil neuropathy, anxiety  PRECAUTIONS: None  SUBJECTIVE: Pt has looked up how to perform quick flicks online and states that the instruction she found told her to do contractions much faster. She has not noticed any changes in bowel movements or nausea, but she does continue to perform bowel massage.   Subjective 04/17/2021 PAIN:  Are you having pain? No     BOWEL MOVEMENT Pain with bowel movement: No Type of bowel movement:Frequency 4x/day Fully empty rectum: No Leakage: Yes: trying to make it to the bathroom for subsequent trips with urgency - reports no control Pads: No Fiber supplement: Yes: Metamucil   URINATION Pain with urination: No Fully empty bladder: No, has to empty shortly after sometimes Stream: Strong Urgency: No Frequency: Depends on if she is nervous or distracted, she is able to make it 2 hours sometimes, but also feels like she is not drinking enough Leakage:  Sometimes from getting out of bed to  the toilet Pads: No   INTERCOURSE Pain with intercourse:  not sexually active   PREGNANCY Vaginal deliveries 2 Tearing Yes: - C-section deliveries 0 Currently pregnant No   PRECAUTIONS: None   WEIGHT BEARING RESTRICTIONS No   FALLS:  Has patient fallen in last 6 months? No, Number of falls: 0   LIVING ENVIRONMENT: Lives with: lives with their family and lives in an assisted living facility Lives in: House/apartment Stairs: No;  Has following equipment at home: None   OCCUPATION: reitred   PLOF:  Independent   PATIENT GOALS To have normal bowel movements without fecal incontinence     OBJECTIVE at eval 04/24/2021:      COGNITION:            Overall cognitive status: Within functional limits for tasks assessed                          Exam 04/24/21  PALPATION: Internal Pelvic Floor No tenderness to palpation   External Perineal Exam: White/pale plaques noted on Lt labia minora (2 o'clock) and Bil labia minor 4-8 o'clock   GENERAL No abdominal tension or tenderness palpated; sternocostal rib angle >90 degrees with moderate mobility with diaphragmatic breathing, but very effortful   POSTURE: rounded shoulders/forward head, thoracic kyphosis, posterior pelvic tilt       PELVIC MMT:   MMT   04/17/2021  Vaginal 2/5, endurance 4 seconds, reps 3   Internal Anal Sphincter  2/5  External Anal Sphincter  2/5  Puborectalis  2/5  Diastasis Recti    (Blank rows = not tested)     05/02/2021 Treatment:  -Pelvic floor quick flicks:  -seated 2 x 10  -regular stance 10x  -wide stance 10x  -staggered stance 10x bil -Manual bowel massage -Lower trunk rotation 3 x 10 -Open books 10x -Clam shells 2 x 10 -Bridge 10x -Hip adduction isometric 10 x 10 sec  04/24/2021 Treatment: Pt education performed on the importance of incorporating fiber and vegetables into diet. We talked about how fruit is great for you, but maybe the sugar is bothering her stomach without enough fiber. We discussed trying to stop doing as many smoothies/processing all of her meals. We also discussed drinking 3-4 glasses of water a day.  Pelvic floor contraction training with internal vaginal/rectal feedback: 2 x 10 quick flicks with breath coordination 10 x 10 seconds with cues for re-contraction  04/17/21 TREATMENT  EVAL Initial treatment included use of squatty potty, relaxed toileting mechanics, and self-bowel massage. Discussed future treatment sessions, including rectal/vaginal exam.      PATIENT EDUCATION:   Education details: HEP progressions for mobility and pelvic floor/core/hip strengthening Person educated: Patient Education method: Explanation, Demonstration, Tactile cues, Verbal cues, and Handouts Education comprehension: verbalized understanding     HOME EXERCISE PROGRAM: [id number]   ASSESSMENT:   CLINICAL IMPRESSION: Pt is not seeing any progress with bowel dysfunction or nausea. We reviewed HEP for pelvic floor strengthening and encouraged to take her time with these exercises instead of doing them very quickly. She was able to begin doing pelvic floor contractions in standing positions and seated with good proprioception of pelvic floor movement. She tolerated mobility and hip/core strengthening exercises well demonstrated by good form and appropriate challenge; we discussed how that will help her pelvic floor. She will continue to benefit from skilled PT intervention in order to decrease urinary/fecal incontinence and help improve bowel evacuation.      OBJECTIVE IMPAIRMENTS decreased  activity tolerance, decreased coordination, decreased endurance, decreased mobility, decreased ROM, decreased strength, hypomobility, increased fascial restrictions, increased muscle spasms, impaired flexibility, postural dysfunction, and pain.    ACTIVITY LIMITATIONS community activity.    PERSONAL FACTORS 1-2 comorbidities: 2 vaginal deliveries, chronic nausea, peripheral neuropathy  are also affecting patient's functional outcome.      REHAB POTENTIAL: Fair   CLINICAL DECISION MAKING: Stable/uncomplicated   EVALUATION COMPLEXITY: Low     GOALS: Goals reviewed with patient? Yes   SHORT TERM GOALS:   STG Name Target Date Goal status  1 Pt will be independent with HEP.  Baseline:  05/15/2021 INITIAL  2 Pt will be able to teach back and utilize urge suppression technique in order to help reduce fecal incontinence.   Baseline:  05/15/2021 INITIAL  3 Pt will be able to correctly perform  diaphragmatic breathing and appropriate pressure management in order to prevent worsening vaginal wall laxity and improve pelvic floor A/ROM.  Baseline: 05/15/2021 INITIAL  4 Pt will be independent with use of squatty potty, relaxed toileting mechanics, and improved bowel movement techniques in order to increase ease of bowel movements and complete evacuation.  Baseline: 05/15/2021 INITIAL                               LONG TERM GOALS:    LTG Name Target Date Goal status  1 Pt will be independent with advanced HEP.  Baseline: 07/16/2021 INITIAL  2 Pt will demonstrate normal pelvic floor muscle tone and A/ROM, able to achieve 4/5 strength with contractions and 10 sec endurance, in order to provide appropriate lumbopelvic support in functional activities.  Baseline: 06/26/2021 INITIAL  3 Pt will report decrease in frequency of bowel movement to 1-2x/day with more complete evacuation. Baseline: 06/26/2021 INITIAL  4 Pt will report no episodes of urinary or fecal incontinence in order to improve confidence in community activities and personal hygiene. Baseline: 06/26/2021 INITIAL                               PLAN: PT FREQUENCY: 1x/week   PT DURATION: 10 weeks   PLANNED INTERVENTIONS: Therapeutic exercises, Therapeutic activity, Neuro Muscular re-education, Balance training, Gait training, Patient/Family education, Joint mobilization, and Dry Needling   PLAN FOR NEXT SESSION: Progress pelvic floor strengthening to include seated and standing positions; begin core/hip strengthening and mobility exercises.    Heather Roberts, PT, DPT03/09/232:43 PM

## 2021-05-03 DIAGNOSIS — F4323 Adjustment disorder with mixed anxiety and depressed mood: Secondary | ICD-10-CM | POA: Diagnosis not present

## 2021-05-10 DIAGNOSIS — F4323 Adjustment disorder with mixed anxiety and depressed mood: Secondary | ICD-10-CM | POA: Diagnosis not present

## 2021-05-15 ENCOUNTER — Telehealth: Payer: Self-pay

## 2021-05-15 ENCOUNTER — Other Ambulatory Visit: Payer: Self-pay

## 2021-05-15 ENCOUNTER — Ambulatory Visit: Payer: Medicare Other

## 2021-05-15 DIAGNOSIS — R279 Unspecified lack of coordination: Secondary | ICD-10-CM | POA: Diagnosis not present

## 2021-05-15 DIAGNOSIS — M6281 Muscle weakness (generalized): Secondary | ICD-10-CM | POA: Diagnosis not present

## 2021-05-15 DIAGNOSIS — L900 Lichen sclerosus et atrophicus: Secondary | ICD-10-CM

## 2021-05-15 NOTE — Therapy (Signed)
?OUTPATIENT PHYSICAL THERAPY TREATMENT NOTE ? ? ?Patient Name: Savannah Diaz ?MRN: [id number] ?DOB:[birth year], 88 y.o., female ?Today's Date: 05/15/2021 ? ?PCP: [name], MD ?REFERRING PROVIDER: [name], MD ? ? PT End of Session - 05/15/21 1443   ? ? Visit Number 4   ? Date for PT Re-Evaluation 06/26/21   ? Authorization Type Medicare   ? Progress Note Due on Visit 10   ? PT Start Time 0626   ? PT Stop Time 1526   ? PT Time Calculation (min) 41 min   ? Activity Tolerance Patient tolerated treatment well   ? Behavior During Therapy [facility] for tasks assessed/performed   ? ?  ?  ? ?  ? ? ? ?Past Medical History:  ?Diagnosis Date  ? Anxiety   ? Arthritis   ? "qwhere; but it doesn't hurt" (05/12/2012)  ? Aspiration pneumonia ([place]) 03/21/2016  ? Cataract   ? left  ? Compression fracture of first lumbar vertebra ([place]) 07/2016  ? superior endplate  ? Depression   ? Dysphagia   ? "have had it for 7 yr; got worse 4 days ago" (05/12/2012)  ? Dysphagia, pharyngoesophageal phase - suspected 05/10/2012  ? GERD (gastroesophageal reflux disease)   ? Hypertension   ? Insomnia   ? Lumbago with sciatica, right side   ? Migraines   ? "menopause cured them" (05/12/2012)  ? Osteoporosis   ? Stage 3 chronic kidney disease ([place])   ? Uterine prolapse   ? ?Past Surgical History:  ?Procedure Laterality Date  ? CATARACT EXTRACTION W/ INTRAOCULAR LENS IMPLANT Left ? 2010  ? ESOPHAGEAL MANOMETRY N/A 11/08/2018  ? Procedure: ESOPHAGEAL MANOMETRY (EM);  Surgeon: Gatha Mayer, MD;  Location: [facility];  Service: Endoscopy;  Laterality: N/A;  ? ESOPHAGOGASTRODUODENOSCOPY N/A 05/10/2012  ? Procedure: ESOPHAGOGASTRODUODENOSCOPY (EGD);  Surgeon: Jerene Bears, MD;  Location: [facility];  Service: Gastroenterology;  Laterality: N/A;  ? TONSILLECTOMY  1940's  ? ?Patient Active Problem List  ? Diagnosis Date Noted  ? High risk medication use 08/08/2016  ? UTI (urinary tract infection) 03/22/2016  ? Influenza A 03/22/2016  ? Fall 03/21/2016  ?  Acute on chronic kidney failure II-III 03/21/2016  ? Transient alteration of awareness 05/10/2015  ? Hypokalemia 05/12/2012  ? Dysphagia oropharyngeal and pharyngeal 05/10/2012  ? Hypertension 04/28/2011  ? Osteoarthritis 04/28/2011  ? Agoraphobia 04/28/2011  ? ? ?REFERRING DIAG: R32,R15.9 (ICD-10-CM) - Urinary and fecal incontinence  ? ?THERAPY DIAG:  ?Muscle weakness (generalized) ? ?Unspecified lack of coordination ? ?PERTINENT HISTORY: Chronic nausea, Bil neuropathy, anxiety ? ?PRECAUTIONS: None ? ?SUBJECTIVE: Pt states that she has appointment with neurologist next week. She has had a very bad day with nausea and bowel movements, needing to have more bowel movements to feel completely empty. She has been working on [employer] and had issues with clamshells that took a few days to recover from. She feels like standing pelvic floor contractions are very challenging. She's unsure if urinary/fecal leaking is getting better or not, but states that if it happens it is nothing dramatic.  ?  ?BOWEL MOVEMENT ?Pain with bowel movement: No ?Type of bowel movement:Frequency 4x/day ?Fully empty rectum: No ?Leakage: Yes: trying to make it to the bathroom for subsequent trips with urgency - reports no control ?Pads: No ?Fiber supplement: Yes: Metamucil ?  ?URINATION ?Pain with urination: No ?Fully empty bladder: No, has to empty shortly after sometimes ?Stream: Strong ?Urgency: No ?Frequency: Depends on if she is nervous or distracted,  she is able to make it 2 hours sometimes, but also feels like she is not drinking enough ?Leakage:  Sometimes from getting out of bed to the toilet ?Pads: No ?  ?INTERCOURSE ?Pain with intercourse:  not sexually active ?  ?PREGNANCY ?Vaginal deliveries 2 ?Tearing Yes: - ?C-section deliveries 0 ?Currently pregnant No ?  ?PRECAUTIONS: None ?  ?WEIGHT BEARING RESTRICTIONS No ?  ?FALLS:  ?Has patient fallen in last 6 months? No, Number of falls: 0 ?  ?LIVING ENVIRONMENT: ?Lives with: lives with their  family and lives in an assisted living facility ?Lives in: House/apartment ?Stairs: No;  ?Has following equipment at home: None ?  ?OCCUPATION: reitred ?  ?PLOF: Independent ?  ?PATIENT GOALS To have normal bowel movements without fecal incontinence ?  ?  ?OBJECTIVE at eval 04/24/2021:  ?  ?  ?COGNITION: ?           Overall cognitive status: Within functional limits for tasks assessed              ?            ?Exam 04/24/21  ?PALPATION: ?Internal Pelvic Floor No tenderness to palpation ?  ?External Perineal Exam: White/pale plaques noted on Lt labia minora (2 o'clock) and Bil labia minor 4-8 o'clock ?  ?GENERAL No abdominal tension or tenderness palpated; sternocostal rib angle >90 degrees with moderate mobility with diaphragmatic breathing, but very effortful ?  ?POSTURE: rounded shoulders/forward head, thoracic kyphosis, posterior pelvic tilt ?  ?  ?  ?PELVIC MMT: ?  ?MMT   ?04/17/2021  ?Vaginal 2/5, endurance 4 seconds, reps 3   ?Internal Anal Sphincter  2/5  ?External Anal Sphincter  2/5  ?Puborectalis  2/5  ?Diastasis Recti    ?(Blank rows = not tested) ?  ?  ?Treatment 05/15/21: ?-internal vaginal/pelvic exam to assess improvements in coordination ? -quick flicks with rectal and vaginal cues separately 4 x 10 ?-long holds with rectal and vaginal cues 2 x 5 total 10 second holds ?-Swiss ball activities to help improve lumbar mobility: circles, diagonals, pelvic tilts, lateral glides ?-pt education on benefit of dilators for feedback due to large improvement in coordination with internal feedback ? ?No emotional/communication barriers or cognitive limitation. Patient is motivated to learn. Patient understands and agrees with treatment goals and plan. PT explains patient will be examined in standing, sitting, and lying down to see how their muscles and joints work. When they are ready, they will be asked to remove their underwear so PT can examine their perineum. The patient is also given the option of providing their  own chaperone as one is not provided in our facility. The patient also has the right and is explained the right to defer or refuse any part of the evaluation or treatment including the internal exam. With the patient's consent, PT will use one gloved finger to gently assess the muscles of the pelvic floor, seeing how well it contracts and relaxes and if there is muscle symmetry. After, the patient will get dressed and PT and patient will discuss exam findings and plan of care. PT and patient discuss plan of care, schedule, attendance policy and HEP activities. ?  ? ?05/02/2021 Treatment:  ?-Pelvic floor quick flicks: ? -seated 2 x 10 ? -regular stance 10x ? -wide stance 10x ? -staggered stance 10x bil ?-Manual bowel massage ?-Lower trunk rotation 3 x 10 ?-Open books 10x ?-Clam shells 2 x 10 ?-Bridge 10x ?-Hip adduction isometric 10 x 10 sec ? ?  04/24/2021 Treatment: Pt education performed on the importance of incorporating fiber and vegetables into diet. We talked about how fruit is great for you, but maybe the sugar is bothering her stomach without enough fiber. We discussed trying to stop doing as many smoothies/processing all of her meals. We also discussed drinking 3-4 glasses of water a day. ? ?Pelvic floor contraction training with internal vaginal/rectal feedback: ?2 x 10 quick flicks with breath coordination ?10 x 10 seconds with cues for re-contraction ? ? ?  ?  ?PATIENT EDUCATION:  ?Education details: Pt education performed on dilators for feedback ?Person educated: Patient ?Education method: Explanation, Demonstration, Tactile cues, Verbal cues, and Handouts ?Education comprehension: verbalized understanding ?  ?  ?HOME EXERCISE PROGRAM: ?[id number] ?  ?ASSESSMENT: ?  ?CLINICAL IMPRESSION: ?Internal rectal and vaginal exam performed today with poor coordination and no difference in strength (2/5). With vaginal and rectal cues for improved contraction, pt was able to feel more confident in contraction and improve  coordination vaginally and rectally. She did have loose stool/mucus right outside of rectum when beginning exam - she states that that does not happen very often when she looses stool without sensation t

## 2021-05-15 NOTE — Telephone Encounter (Signed)
-----   Message from Gatha Mayer, MD sent at 05/15/2021 12:33 PM EDT ----- ?Regarding: RE: Gynecology referral ?Cyril Mourning, ? ?Thank you for your attention to this matter and I would be happy to make a referral. ? ? ?Remo Lipps, please refer this patient to Dr. Edwinna Areola [facility] Edwinna Areola) of gynecology ? ? ?Glendell Docker ?----- Message ----- ?From: Jule Economy, PT ?Sent: 05/15/2021   8:58 AM EDT ?To: Gatha Mayer, MD ?Subject: Gynecology referral                           ? ?Hi Dr. Carlean Purl, ?I have started seeing this patient in pelvic floor physical therapy to see if we can help normalize her bowel movements. When doing our vulvar/vaginal portion of the pelvic floor assessment, I saw some tissue changes that could possibly be consistent with lichens sclerosis. I would love for her to see a gynecologist (patient says it has been a long time) just to rule this diagnosis out or treat if needed. The patient felt more comfortable with you making this referral instead of her primary care provider, and I didn't know if you would be willing to do this.  ?Thank you so much for your help, ?Heather Roberts ? ? ?

## 2021-05-15 NOTE — Telephone Encounter (Signed)
Ambulatory Referral sent to Dr. Edwinna Areola of gynecology: [facility] ?Left message for pt to call back  ?

## 2021-05-15 NOTE — Telephone Encounter (Signed)
Ambulatory Referral sent to Dr. Edwinna Areola of gynecology: [facility] ?Pt made aware ?Pt verbalized understanding with all questions answered.  ? ?  ?

## 2021-05-16 ENCOUNTER — Telehealth: Payer: Self-pay | Admitting: Internal Medicine

## 2021-05-16 NOTE — Telephone Encounter (Signed)
Sure - ok to rx #90 1 refill if needs ?

## 2021-05-16 NOTE — Telephone Encounter (Signed)
Is this ok with you Sir? ?

## 2021-05-16 NOTE — Telephone Encounter (Signed)
I spoke with Savannah Diaz and she has plenty she just wanted to go from '30mg'$  daily to '15mg'$  daily. I told her Dr Carlean Purl said that would be okay. ?

## 2021-05-16 NOTE — Telephone Encounter (Signed)
Patient called regarding Mirtazapine medication. Patient states she's taking 30 mg and would like to go back taking 15 mg. Please advise.  ?

## 2021-05-17 DIAGNOSIS — F4323 Adjustment disorder with mixed anxiety and depressed mood: Secondary | ICD-10-CM | POA: Diagnosis not present

## 2021-05-20 DIAGNOSIS — F3341 Major depressive disorder, recurrent, in partial remission: Secondary | ICD-10-CM | POA: Diagnosis not present

## 2021-05-20 DIAGNOSIS — N1831 Chronic kidney disease, stage 3a: Secondary | ICD-10-CM | POA: Diagnosis not present

## 2021-05-20 DIAGNOSIS — I129 Hypertensive chronic kidney disease with stage 1 through stage 4 chronic kidney disease, or unspecified chronic kidney disease: Secondary | ICD-10-CM | POA: Diagnosis not present

## 2021-05-23 ENCOUNTER — Ambulatory Visit: Payer: Medicare Other

## 2021-05-23 DIAGNOSIS — R279 Unspecified lack of coordination: Secondary | ICD-10-CM

## 2021-05-23 DIAGNOSIS — M6281 Muscle weakness (generalized): Secondary | ICD-10-CM | POA: Diagnosis not present

## 2021-05-23 NOTE — Therapy (Signed)
?OUTPATIENT PHYSICAL THERAPY TREATMENT NOTE ? ? ?Patient Name: Savannah Diaz ?MRN: [id number] ?DOB:1933/01/05, 88 y.o., female ?Today's Date: 05/23/2021 ? ?PCP: Alda Berthold, DO ?REFERRING PROVIDER: [name], MD ? ? PT End of Session - 05/23/21 1348   ? ? Visit Number 5   ? Date for PT Re-Evaluation 06/26/21   ? Authorization Type Medicare   ? Progress Note Due on Visit 10   ? PT Start Time 1400   ? PT Stop Time 1440   ? PT Time Calculation (min) 40 min   ? Activity Tolerance Patient tolerated treatment well   ? Behavior During Therapy [facility] for tasks assessed/performed   ? ?  ?  ? ?  ? ? ? ?Past Medical History:  ?Diagnosis Date  ? Anxiety   ? Arthritis   ? "qwhere; but it doesn't hurt" (05/12/2012)  ? Aspiration pneumonia ([place]) 03/21/2016  ? Cataract   ? left  ? Compression fracture of first lumbar vertebra ([place]) 07/2016  ? superior endplate  ? Depression   ? Dysphagia   ? "have had it for 7 yr; got worse 4 days ago" (05/12/2012)  ? Dysphagia, pharyngoesophageal phase - suspected 05/10/2012  ? GERD (gastroesophageal reflux disease)   ? Hypertension   ? Insomnia   ? Lumbago with sciatica, right side   ? Migraines   ? "menopause cured them" (05/12/2012)  ? Osteoporosis   ? Stage 3 chronic kidney disease ([place])   ? Uterine prolapse   ? ?Past Surgical History:  ?Procedure Laterality Date  ? CATARACT EXTRACTION W/ INTRAOCULAR LENS IMPLANT Left ? 2010  ? ESOPHAGEAL MANOMETRY N/A 11/08/2018  ? Procedure: ESOPHAGEAL MANOMETRY (EM);  Surgeon: Gatha Mayer, MD;  Location: [facility];  Service: Endoscopy;  Laterality: N/A;  ? ESOPHAGOGASTRODUODENOSCOPY N/A 05/10/2012  ? Procedure: ESOPHAGOGASTRODUODENOSCOPY (EGD);  Surgeon: Jerene Bears, MD;  Location: [facility];  Service: Gastroenterology;  Laterality: N/A;  ? TONSILLECTOMY  1940's  ? ?Patient Active Problem List  ? Diagnosis Date Noted  ? High risk medication use 08/08/2016  ? UTI (urinary tract infection) 03/22/2016  ? Influenza A 03/22/2016  ? Fall 03/21/2016  ?  Acute on chronic kidney failure II-III 03/21/2016  ? Transient alteration of awareness 05/10/2015  ? Hypokalemia 05/12/2012  ? Dysphagia oropharyngeal and pharyngeal 05/10/2012  ? Hypertension 04/28/2011  ? Osteoarthritis 04/28/2011  ? Agoraphobia 04/28/2011  ? ? ?REFERRING DIAG: R32,R15.9 (ICD-10-CM) - Urinary and fecal incontinence  ? ?THERAPY DIAG:  ?Muscle weakness (generalized) ? ?Unspecified lack of coordination ? ?PERTINENT HISTORY: Chronic nausea, Bil neuropathy, anxiety ? ?PRECAUTIONS: None ? ?SUBJECTIVE: Pt states that she has received dilators and used the vaginal dilator; she feels more nervous about using the rectal dilator due to fear of mess. She states that vaginal dilator is very helpful and makes the exercises feel more impactful.  ?  ?BOWEL MOVEMENT ?Pain with bowel movement: No ?Type of bowel movement:Frequency 4x/day ?Fully empty rectum: No ?Leakage: Yes: trying to make it to the bathroom for subsequent trips with urgency - reports no control ?Pads: No ?Fiber supplement: Yes: Metamucil ?  ?URINATION ?Pain with urination: No ?Fully empty bladder: No, has to empty shortly after sometimes ?Stream: Strong ?Urgency: No ?Frequency: Depends on if she is nervous or distracted, she is able to make it 2 hours sometimes, but also feels like she is not drinking enough ?Leakage:  Sometimes from getting out of bed to the toilet ?Pads: No ?  ?INTERCOURSE ?Pain with intercourse:  not  sexually active ?  ?PREGNANCY ?Vaginal deliveries 2 ?Tearing Yes: - ?C-section deliveries 0 ?Currently pregnant No ?  ?PRECAUTIONS: None ?  ?WEIGHT BEARING RESTRICTIONS No ?  ?FALLS:  ?Has patient fallen in last 6 months? No, Number of falls: 0 ?  ?LIVING ENVIRONMENT: ?Lives with: lives with their family and lives in an assisted living facility ?Lives in: House/apartment ?Stairs: No;  ?Has following equipment at home: None ?  ?OCCUPATION: reitred ?  ?PLOF: Independent ?  ?PATIENT GOALS To have normal bowel movements without fecal  incontinence ?  ?  ?OBJECTIVE at eval 04/24/2021:  ?  ?  ?COGNITION: ?           Overall cognitive status: Within functional limits for tasks assessed              ?            ?Exam 04/24/21  ?PALPATION: ?Internal Pelvic Floor No tenderness to palpation ?  ?External Perineal Exam: White/pale plaques noted on Lt labia minora (2 o'clock) and Bil labia minor 4-8 o'clock ?  ?GENERAL No abdominal tension or tenderness palpated; sternocostal rib angle >90 degrees with moderate mobility with diaphragmatic breathing, but very effortful ?  ?POSTURE: rounded shoulders/forward head, thoracic kyphosis, posterior pelvic tilt ?  ?  ?  ?PELVIC MMT: ?  ?MMT   ?04/17/2021  ?Vaginal 2/5, endurance 4 seconds, reps 3   ?Internal Anal Sphincter  2/5  ?External Anal Sphincter  2/5  ?Puborectalis  2/5  ?Diastasis Recti    ?(Blank rows = not tested) ?  ?  ?Treatment 05/23/21: ?Manual: ?Soft tissue mobilization: ?Scar tissue mobilization: ?Myofascial release:  ?lumbar paraspinals with massage tool ?Spinal mobilization: ?Internal pelvic floor techniques: ?Dry needling: ?Neuromuscular re-education: ?Core retraining:  ?Core facilitation: ?Form correction: ?Pelvic floor contraction training: ?Down training: ?Exercises: ?Stretches/mobility: ?Seated trunk rotation with horizontal abduction 10x bil ?LTR 2 x 10 ?Strengthening: ?Bridge with hip adduction 2 x 10 ?Seated clam green band 2 x 10 ?Band pull downs 2 x 10 red band ?Therapeutic activities: ?Functional strengthening activities: ?Self-care: ? ? ? ?Treatment 05/15/21: ?-internal vaginal/pelvic exam to assess improvements in coordination ? -quick flicks with rectal and vaginal cues separately 4 x 10 ?-long holds with rectal and vaginal cues 2 x 5 total 10 second holds ?-Swiss ball activities to help improve lumbar mobility: circles, diagonals, pelvic tilts, lateral glides ?-pt education on benefit of dilators for feedback due to large improvement in coordination with internal feedback ? ?No  emotional/communication barriers or cognitive limitation. Patient is motivated to learn. Patient understands and agrees with treatment goals and plan. PT explains patient will be examined in standing, sitting, and lying down to see how their muscles and joints work. When they are ready, they will be asked to remove their underwear so PT can examine their perineum. The patient is also given the option of providing their own chaperone as one is not provided in our facility. The patient also has the right and is explained the right to defer or refuse any part of the evaluation or treatment including the internal exam. With the patient's consent, PT will use one gloved finger to gently assess the muscles of the pelvic floor, seeing how well it contracts and relaxes and if there is muscle symmetry. After, the patient will get dressed and PT and patient will discuss exam findings and plan of care. PT and patient discuss plan of care, schedule, attendance policy and HEP activities. ?  ? ?05/02/2021 Treatment:  ?-Pelvic  floor quick flicks: ? -seated 2 x 10 ? -regular stance 10x ? -wide stance 10x ? -staggered stance 10x bil ?-Manual bowel massage ?-Lower trunk rotation 3 x 10 ?-Open books 10x ?-Clam shells 2 x 10 ?-Bridge 10x ?-Hip adduction isometric 10 x 10 sec ? ? ? ?  ?  ?PATIENT EDUCATION:  ?Education details: Pt education performed on importance of regular orgasm for vaginal health; updated HEP; reassurance about use of rectal dilator most likely not causing mess. ?Person educated: Patient ?Education method: Explanation, Demonstration, Tactile cues, Verbal cues, and Handouts ?Education comprehension: verbalized understanding ?  ?  ?HOME EXERCISE PROGRAM: ?[id number] ?  ?ASSESSMENT: ?  ?CLINICAL IMPRESSION: ?Pt has done very well over the last week with improvements in pelvic floor contraction proprioception with use of dilator. Due to having orgasm, it prompted conversation about how regular orgasm is important for  vaginal health. To help improve lumbar mobility that may be impacting bowel function, myofascial release performed to lumbar paraspinals with excellent tolerance and improved mobility. She did well with all exercise

## 2021-05-24 ENCOUNTER — Encounter: Payer: Self-pay | Admitting: Neurology

## 2021-05-24 ENCOUNTER — Ambulatory Visit (INDEPENDENT_AMBULATORY_CARE_PROVIDER_SITE_OTHER): Payer: Medicare Other | Admitting: Neurology

## 2021-05-24 VITALS — BP 146/81 | HR 79 | Ht 60.0 in | Wt 113.4 lb

## 2021-05-24 DIAGNOSIS — F4323 Adjustment disorder with mixed anxiety and depressed mood: Secondary | ICD-10-CM | POA: Diagnosis not present

## 2021-05-24 DIAGNOSIS — G629 Polyneuropathy, unspecified: Secondary | ICD-10-CM | POA: Diagnosis not present

## 2021-05-24 NOTE — Patient Instructions (Signed)
It was nice to see you today.  If your numbness/tinging in the feet gets worse, please call my office.  ?

## 2021-05-24 NOTE — Progress Notes (Signed)
?[facility] ?Neurology Division ?Clinic Note - Initial Visit ? ? ?Date: 05/24/21 ? ?Waymon Budge ?MRN: [id number] ?DOB: 1932/06/09 ? ? ?Dear Dr. Carlean Purl: ? ?Thank you for your kind referral of Savannah Diaz for consultation of neuropathy. Although her history is well known to you, please allow [place] to reiterate it for the purpose of our medical record. The patient was accompanied to the clinic by daughter who also provides collateral information.  ?  ? ?History of Present Illness: ?ILLIANNA PASCHAL is a 88 y.o. female with depression/anxiety, GERD, hypertension, stage 3 CKD, and chronic nausea presenting for evaluation of bilateral feet paresthesias. She has numbness/tingling of the feet which started about a year ago, which involves her toes and mid-foot.  She feels as if there are marbles in her shoes.  Symptoms are constant.  No pain such as burning, stabbing, or stinging.  She denies weakness, imbalance, or falls.  She walks unassisted.   ? ?No history of diabetes mellitus, heavy alcohol use, or exposure to chemotherapy.  She has low vitamin B12 and is taking vitamin B12 supplements.  ? ?Out-side paper records, electronic medical record, and images have been reviewed where available and summarized as:  ?Lab Results  ?Component Value Date  ? HGBA1C 5.8 (H) 04/23/2015  ? ?Lab Results  ?Component Value Date  ? VZSMOLMB86 244 03/27/2021  ? ?Lab Results  ?Component Value Date  ? TSH 0.582 05/12/2012  ? ? ? ?Past Medical History:  ?Diagnosis Date  ? Anxiety   ? Arthritis   ? "qwhere; but it doesn't hurt" (05/12/2012)  ? Aspiration pneumonia ([place]) 03/21/2016  ? Cataract   ? left  ? Compression fracture of first lumbar vertebra ([place]) 07/2016  ? superior endplate  ? Depression   ? Dysphagia   ? "have had it for 7 yr; got worse 4 days ago" (05/12/2012)  ? Dysphagia, pharyngoesophageal phase - suspected 05/10/2012  ? GERD (gastroesophageal reflux disease)   ? Hypertension   ? Insomnia   ? Lumbago with sciatica, right  side   ? Migraines   ? "menopause cured them" (05/12/2012)  ? Osteoporosis   ? Stage 3 chronic kidney disease ([place])   ? Uterine prolapse   ? ? ?Past Surgical History:  ?Procedure Laterality Date  ? CATARACT EXTRACTION W/ INTRAOCULAR LENS IMPLANT Left ? 2010  ? ESOPHAGEAL MANOMETRY N/A 11/08/2018  ? Procedure: ESOPHAGEAL MANOMETRY (EM);  Surgeon: Gatha Mayer, MD;  Location: [facility];  Service: Endoscopy;  Laterality: N/A;  ? ESOPHAGOGASTRODUODENOSCOPY N/A 05/10/2012  ? Procedure: ESOPHAGOGASTRODUODENOSCOPY (EGD);  Surgeon: Jerene Bears, MD;  Location: [facility];  Service: Gastroenterology;  Laterality: N/A;  ? TONSILLECTOMY  1940's  ? ? ? ?Medications:  ?Outpatient Encounter Medications as of 05/24/2021  ?Medication Sig  ? amLODipine (NORVASC) 10 MG tablet Take 10 mg by mouth daily.  ? atenolol (TENORMIN) 50 MG tablet Take 50 mg by mouth daily.  ? famotidine (PEPCID) 40 MG tablet Take 40 mg by mouth at bedtime.  ? hydrocortisone cream 1 % Apply 1 application topically daily. For dry skin on sides of mouth  ? LORazepam (ATIVAN) 0.5 MG tablet Take 0.5-1 mg by mouth See admin instructions. Take 1/2 tablet in the morning, 1 tablets (0.5) in the afternoon, and 1/2 tablets (0.25) at night  ? mirtazapine (REMERON SOL-TAB) 15 MG disintegrating tablet Take 15 mg by mouth at bedtime.   ? sertraline (ZOLOFT) 20 MG/ML concentrated solution Take by mouth daily.  ? traZODone (DESYREL)  50 MG tablet Take 50 mg by mouth at bedtime.  ? vitamin B-12 (CYANOCOBALAMIN) 1000 MCG tablet Take 1 tablet (1,000 mcg total) by mouth daily.  ? busPIRone (BUSPAR) 5 MG tablet Take 5 mg by mouth 2 (two) times daily. (Patient not taking: Reported on 05/24/2021)  ? ?No facility-administered encounter medications on file as of 05/24/2021.  ? ? ?Allergies:  ?Allergies  ?Allergen Reactions  ? Lactose Intolerance (Gi) Other (See Comments)  ?  Gi tract issues  ? Lactulose Other (See Comments)  ?  Gi tract issues  ? Sulfa Antibiotics Hives  ?  Childhood  allergy   ? ? ?Family History: ?Family History  ?Problem Relation Age of Onset  ? Arthritis Mother   ? Hypertension Mother   ? Dementia Mother   ? Colon cancer Father   ? Stomach cancer Daughter   ? Esophageal cancer Neg Hx   ? Pancreatic cancer Neg Hx   ? ? ?Social History: ?Social History  ? ?Tobacco Use  ? Smoking Diaz: Never  ? Smokeless tobacco: Never  ?Vaping Use  ? Vaping Use: Never used  ?Substance Use Topics  ? Alcohol use: Not Currently  ?  Comment: 05/12/2012 "have a drink hardly ever anymore"  ? Drug use: No  ? ?Social History  ? ?Social History Narrative  ? Widowed, retired, 2 daughters husband was a Mining engineer at [employer]  ? Her daughters and son-in-law's are all philosophy professors to it Fredda Hammed, daughter here at [facility] son-in-law at [facility]  ? Lives at [place]  ? No Etoh/tobacco  ? Back exercises and walking are patient forms of exercising.    ? ? ?Vital Signs:  ?BP (!) 146/81   Pulse 79   Ht 5' (1.524 m)   Wt 113 lb 6.4 oz (51.4 kg)   SpO2 96%   BMI 22.15 kg/m?  ? ?Neurological Exam: ?Savannah Diaz including orientation to time, place, person, recent and remote memory, attention span and concentration, language, and fund of knowledge is normal.  Speech is not dysarthric. ? ?CRANIAL NERVES: ?II:  No visual field defects.    ?III-IV-VI: Pupils equal round and reactive to light.  Normal conjugate, extra-ocular eye movements in all directions of gaze.  No nystagmus.  No ptosis.   ?V:  Normal facial sensation.    ?VII:  Normal facial symmetry and movements.   ?VIII:  Normal hearing and vestibular function.   ?IX-X:  Normal palatal movement.   ?XI:  Normal shoulder shrug and head rotation.   ?XII:  Normal tongue strength and range of motion, no deviation or fasciculation. ? ?MOTOR:  No atrophy, fasciculations or abnormal movements.  No pronator drift.  ? ?Upper Extremity:  Right  Left  ?Deltoid  5/5   5/5   ?Biceps  5/5   5/5   ?Triceps  5/5   5/5    ?Infraspinatus 5/5  5/5  ?Medial pectoralis 5/5  5/5  ?Wrist extensors  5/5   5/5   ?Wrist flexors  5/5   5/5   ?Finger extensors  5/5   5/5   ?Finger flexors  5/5   5/5   ?Dorsal interossei  5/5   5/5   ?Abductor pollicis  5/5   5/5   ?Tone (Ashworth scale)  0  0  ? ?Lower Extremity:  Right  Left  ?Hip flexors  5/5   5/5   ?Hip extensors  5/5   5/5   ?Adductor 5/5  5/5  ?Abductor 5/5  5/5  ?Knee flexors  5/5   5/5   ?Knee extensors  5/5   5/5   ?Dorsiflexors  5/5   5/5   ?Plantarflexors  5/5   5/5   ?Toe extensors  5/5   5/5   ?Toe flexors  5/5   5/5   ?Tone (Ashworth scale)  0  0  ? ?MSRs:  ?Right        Left                  ?brachioradialis 2+  2+  ?biceps 2+  2+  ?triceps 2+  2+  ?patellar 2+  2+  ?ankle jerk 2+  2+  ?Hoffman no  no  ?plantar response down  down  ? ?SENSORY:  Normal and symmetric perception of light touch, pinprick, vibration, and proprioception.  Mildly reduced temperature distally in the feet. Romberg's sign absent.  ? ?COORDINATION/GAIT: Normal finger-to- nose-finger.  Intact rapid alternating movements bilaterally.   Gait narrow based and stable. Tandem and stressed gait intact.  ? ? ?IMPRESSION: ?Bilateral feet paresthesias are most suggestive of neuropathy, however, her exam is remarkably intact. She does not have any risk factors for neuropathy, except advanced age.  Alternatively, there may be some lumbar spinal canal stenosis, but this would manifesting with exertional leg/back pain, which she denies.  I offered NCS/EMG of the legs to better characterize the nature of her symptoms, which she declined.  She understands that management is supportive.  Her pain is more annoyance than disabling to warrant medication.  All questions answered.  ? ?Return to clinic as needed ? ?Thank you for allowing me to participate in patient's care.  If I can answer any additional questions, I would be pleased to do so.   ? ?Sincerely, ? ? ? ?Karma Ansley K. Posey Pronto, DO ? ?

## 2021-05-27 ENCOUNTER — Ambulatory Visit: Payer: Medicare Other | Attending: Internal Medicine

## 2021-05-27 DIAGNOSIS — R279 Unspecified lack of coordination: Secondary | ICD-10-CM | POA: Insufficient documentation

## 2021-05-27 DIAGNOSIS — M6281 Muscle weakness (generalized): Secondary | ICD-10-CM | POA: Diagnosis not present

## 2021-05-27 NOTE — Therapy (Signed)
?OUTPATIENT PHYSICAL THERAPY TREATMENT NOTE ? ? ?Patient Name: Savannah Diaz ?MRN: [id number] ?DOB:08/22/1932, 88 y.o., female ?Today's Date: 05/27/2021 ? ?PCP: No primary care provider on file. ?REFERRING PROVIDER: [name], MD ? ? PT End of Session - 05/27/21 1401   ? ? Visit Number 6   ? Date for PT Re-Evaluation 06/26/21   ? Authorization Type Medicare   ? Progress Note Due on Visit 10   ? PT Start Time 1401   ? PT Stop Time 1441   ? PT Time Calculation (min) 40 min   ? Activity Tolerance Patient tolerated treatment well   ? Behavior During Therapy [facility] for tasks assessed/performed   ? ?  ?  ? ?  ? ? ? ? ?Past Medical History:  ?Diagnosis Date  ? Anxiety   ? Arthritis   ? "qwhere; but it doesn't hurt" (05/12/2012)  ? Aspiration pneumonia ([place]) 03/21/2016  ? Cataract   ? left  ? Compression fracture of first lumbar vertebra ([place]) 07/2016  ? superior endplate  ? Depression   ? Dysphagia   ? "have had it for 7 yr; got worse 4 days ago" (05/12/2012)  ? Dysphagia, pharyngoesophageal phase - suspected 05/10/2012  ? GERD (gastroesophageal reflux disease)   ? Hypertension   ? Insomnia   ? Lumbago with sciatica, right side   ? Migraines   ? "menopause cured them" (05/12/2012)  ? Osteoporosis   ? Stage 3 chronic kidney disease ([place])   ? Uterine prolapse   ? ?Past Surgical History:  ?Procedure Laterality Date  ? CATARACT EXTRACTION W/ INTRAOCULAR LENS IMPLANT Left ? 2010  ? ESOPHAGEAL MANOMETRY N/A 11/08/2018  ? Procedure: ESOPHAGEAL MANOMETRY (EM);  Surgeon: Gatha Mayer, MD;  Location: [facility];  Service: Endoscopy;  Laterality: N/A;  ? ESOPHAGOGASTRODUODENOSCOPY N/A 05/10/2012  ? Procedure: ESOPHAGOGASTRODUODENOSCOPY (EGD);  Surgeon: Jerene Bears, MD;  Location: [facility];  Service: Gastroenterology;  Laterality: N/A;  ? TONSILLECTOMY  1940's  ? ?Patient Active Problem List  ? Diagnosis Date Noted  ? High risk medication use 08/08/2016  ? UTI (urinary tract infection) 03/22/2016  ? Influenza A 03/22/2016  ? Fall  03/21/2016  ? Acute on chronic kidney failure II-III 03/21/2016  ? Transient alteration of awareness 05/10/2015  ? Hypokalemia 05/12/2012  ? Dysphagia oropharyngeal and pharyngeal 05/10/2012  ? Hypertension 04/28/2011  ? Osteoarthritis 04/28/2011  ? Agoraphobia 04/28/2011  ? ? ?REFERRING DIAG: R32,R15.9 (ICD-10-CM) - Urinary and fecal incontinence  ? ?THERAPY DIAG:  ?Muscle weakness (generalized) ? ?Unspecified lack of coordination ? ?PERTINENT HISTORY: Chronic nausea, Bil neuropathy, anxiety ? ?PRECAUTIONS: None ? ?SUBJECTIVE: Pt states that she has seen neurologist for the first time and states that MD did not think that stomach issues and neuropathy were related. The last two morning she states that she was unable to make it to the bathroom in the morning. She states that she does try and perform the urge suppression technique. She reports improvement in low back pain after last treatment session, but she has not noticed any improvement in low back pain. She states that she had fecal incontinence today when she was rushed and nervous. ? ? ?BOWEL MOVEMENT ?Pain with bowel movement: No ?Type of bowel movement:Frequency 4x/day ?Fully empty rectum: No ?Leakage: Yes: trying to make it to the bathroom for subsequent trips with urgency - reports no control ?Pads: No ?Fiber supplement: Yes: Metamucil ?  ?URINATION ?Pain with urination: No ?Fully empty bladder: No, has to empty shortly after  sometimes ?Stream: Strong ?Urgency: No ?Frequency: Depends on if she is nervous or distracted, she is able to make it 2 hours sometimes, but also feels like she is not drinking enough ?Leakage:  Sometimes from getting out of bed to the toilet ?Pads: No ?  ?INTERCOURSE ?Pain with intercourse:  not sexually active ?  ?PREGNANCY ?Vaginal deliveries 2 ?Tearing Yes: - ?C-section deliveries 0 ?Currently pregnant No ?  ?PRECAUTIONS: None ?  ?WEIGHT BEARING RESTRICTIONS No ?  ?FALLS:  ?Has patient fallen in last 6 months? No, Number of falls:  0 ?  ?LIVING ENVIRONMENT: ?Lives with: lives with their family and lives in an assisted living facility ?Lives in: House/apartment ?Stairs: No;  ?Has following equipment at home: None ?  ?OCCUPATION: reitred ?  ?PLOF: Independent ?  ?PATIENT GOALS To have normal bowel movements without fecal incontinence ?  ?  ?OBJECTIVE at eval 04/24/2021:  ?  ?  ?COGNITION: ?           Overall cognitive status: Within functional limits for tasks assessed              ?            ?Exam 04/24/21  ?PALPATION: ?Internal Pelvic Floor No tenderness to palpation ?  ?External Perineal Exam: White/pale plaques noted on Lt labia minora (2 o'clock) and Bil labia minor 4-8 o'clock ?  ?GENERAL No abdominal tension or tenderness palpated; sternocostal rib angle >90 degrees with moderate mobility with diaphragmatic breathing, but very effortful ?  ?POSTURE: rounded shoulders/forward head, thoracic kyphosis, posterior pelvic tilt ?  ?  ?  ?PELVIC MMT: ?  ?MMT   ?04/17/2021  ?Vaginal 2/5, endurance 4 seconds, reps 3   ?Internal Anal Sphincter  2/5  ?External Anal Sphincter  2/5  ?Puborectalis  2/5  ?Diastasis Recti    ?(Blank rows = not tested) ?  ?  ?Treatment 05/27/21: ?Manual: ?Soft tissue mobilization:lumbar paraspinals, obliques, QL ?Scar tissue mobilization:  ?Myofascial release: lumbar paraspinals with massage tool ?Spinal mobilization: ?Internal pelvic floor techniques: ?Dry needling: ?Neuromuscular re-education: ?Core retraining:  ?Core facilitation: ?Form correction: ?Pelvic floor contraction training: ?Down training: ?Exercises: ?Stretches/mobility: ?Modified thomas stretch 60 sec bil ?Strengthening: ?Straight leg raise 2 x 10 bil ?Standing march on airex ?Therapeutic activities: ?Functional strengthening activities: ?Self-care: ?Bladder retraining ? ? ?Treatment 05/23/21: ?Manual: ?Soft tissue mobilization: ?Scar tissue mobilization: ?Myofascial release:  ?lumbar paraspinals with massage tool ?Spinal mobilization: ?Internal pelvic floor  techniques: ?Dry needling: ?Neuromuscular re-education: ?Core retraining:  ?Core facilitation: ?Form correction: ?Pelvic floor contraction training: ?Down training: ?Exercises: ?Stretches/mobility: ?Seated trunk rotation with horizontal abduction 10x bil ?LTR 2 x 10 ?Strengthening: ?Bridge with hip adduction 2 x 10 ?Seated clam green band 2 x 10 ?Band pull downs 2 x 10 red band ?Therapeutic activities: ?Functional strengthening activities: ?Self-care: ? ? ? ?Treatment 05/15/21: ?-internal vaginal/pelvic exam to assess improvements in coordination ? -quick flicks with rectal and vaginal cues separately 4 x 10 ?-long holds with rectal and vaginal cues 2 x 5 total 10 second holds ?-Swiss ball activities to help improve lumbar mobility: circles, diagonals, pelvic tilts, lateral glides ?-pt education on benefit of dilators for feedback due to large improvement in coordination with internal feedback ? ?No emotional/communication barriers or cognitive limitation. Patient is motivated to learn. Patient understands and agrees with treatment goals and plan. PT explains patient will be examined in standing, sitting, and lying down to see how their muscles and joints work. When they are ready, they will be asked  to remove their underwear so PT can examine their perineum. The patient is also given the option of providing their own chaperone as one is not provided in our facility. The patient also has the right and is explained the right to defer or refuse any part of the evaluation or treatment including the internal exam. With the patient's consent, PT will use one gloved finger to gently assess the muscles of the pelvic floor, seeing how well it contracts and relaxes and if there is muscle symmetry. After, the patient will get dressed and PT and patient will discuss exam findings and plan of care. PT and patient discuss plan of care, schedule, attendance policy and HEP activities. ? ?  ?  ?PATIENT EDUCATION:  ?Education  details: Strict bladder retraining and exercise progressions.  ?Person educated: Patient ?Education method: Explanation, Demonstration, Tactile cues, Verbal cues, and Handouts ?Education comprehension: Charity fundraiser

## 2021-05-27 NOTE — Patient Instructions (Addendum)
Bladder/bowel retraining: ? ?Drink 4-8oz an hour ?Stop water intake 3 hours before bed ?Try to go at least 3 hours between trips to the bathroom - go whether you need to or not. ? ?For two weeks. ? ?

## 2021-05-29 ENCOUNTER — Telehealth: Payer: Self-pay | Admitting: *Deleted

## 2021-05-29 ENCOUNTER — Encounter: Payer: Self-pay | Admitting: Internal Medicine

## 2021-05-29 ENCOUNTER — Non-Acute Institutional Stay (INDEPENDENT_AMBULATORY_CARE_PROVIDER_SITE_OTHER): Payer: Medicare Other | Admitting: Internal Medicine

## 2021-05-29 VITALS — BP 148/77 | HR 97 | Temp 97.7°F | Ht 60.0 in | Wt 112.3 lb

## 2021-05-29 DIAGNOSIS — M159 Polyosteoarthritis, unspecified: Secondary | ICD-10-CM | POA: Diagnosis not present

## 2021-05-29 DIAGNOSIS — I10 Essential (primary) hypertension: Secondary | ICD-10-CM

## 2021-05-29 DIAGNOSIS — L989 Disorder of the skin and subcutaneous tissue, unspecified: Secondary | ICD-10-CM | POA: Diagnosis not present

## 2021-05-29 DIAGNOSIS — R110 Nausea: Secondary | ICD-10-CM | POA: Diagnosis not present

## 2021-05-29 DIAGNOSIS — F3341 Major depressive disorder, recurrent, in partial remission: Secondary | ICD-10-CM | POA: Diagnosis not present

## 2021-05-29 DIAGNOSIS — R159 Full incontinence of feces: Secondary | ICD-10-CM | POA: Diagnosis not present

## 2021-05-29 DIAGNOSIS — N1831 Chronic kidney disease, stage 3a: Secondary | ICD-10-CM

## 2021-05-29 DIAGNOSIS — R32 Unspecified urinary incontinence: Secondary | ICD-10-CM

## 2021-05-29 DIAGNOSIS — G629 Polyneuropathy, unspecified: Secondary | ICD-10-CM

## 2021-05-29 MED ORDER — ESOMEPRAZOLE MAGNESIUM 40 MG PO CPDR: 40.0000 mg | DELAYED_RELEASE_CAPSULE | Freq: Every morning | ORAL | 1 refills | Status: DC

## 2021-05-29 MED ORDER — OMEPRAZOLE POWD
40.0000 mg | Freq: Every morning | 0 refills | Status: DC
Start: 2021-05-29 — End: 2021-05-29

## 2021-05-29 NOTE — Patient Instructions (Signed)
I'm starting you on Protonix 40 mg in the morning for breakfast for 4 weeks for your nausea.  ? ?I'll make referral for dermatologist at [facility] for skin Lesions. ? ?You can take vitamin D 1,000 units daily over the counter.  ?

## 2021-05-29 NOTE — Telephone Encounter (Signed)
Heather with [facility] called needing Clarification on Nexium, stated that it was sent in as a powder but written as a Capsule.  ? ?Rx was corrected and sent back to the pharmacy.  ? ?esomeprazole (NEXIUM) 40 MG capsule [[id number]]  ?  Order Details ?Dose: 40 mg Route: Oral Frequency: Every morning  ?Dispense Quantity: 30 capsule Refills: 1   ?     ?Sig: Take 1 capsule (40 mg total) by mouth every morning. Can open the capsule and take with Apple sauce if having issue swallowing  ?E-Prescribing Status: Receipt confirmed by pharmacy (05/29/2021  4:41 PM EDT) ?

## 2021-05-31 NOTE — Progress Notes (Signed)
? ?Location:    ?  ?Place of Service:    ? ?Provider:  ? ?Code Status:  ?Goals of Care:  ? ?  05/24/2021  ? 12:51 PM  ?Advanced Directives  ?Does Patient Have a Medical Advance Directive? Yes  ? ? ? ?Chief Complaint  ?Patient presents with  ? Medical Management of Chronic Issues  ?  Establish Care  ? ? ?HPI: Patient is a 88 y.o. female seen today for medical management of chronic diseases.   ? ?Patient has h/o  ?Nausea for past 1 year ?Zofran no help  ?Seen and Followed by Dr Carlean Purl ?EGD in 11/22 Mild inflammation characterized by erythema, friability and granularity was found in the gastric antrum. ?Manometry in 09/20 Normal in EG junction and no Motility issue ?CT scan of abdomen in 05/22 ?Says she feels nauseated all the time. No vomiting no abdominal pain ?Does have h/o Urinary and Bowel incontinence and working with therapy for Pelvic Floor ?And Fiber ?Wt Readings from Last 3 Encounters:  ?05/29/21 112 lb 4.8 oz (50.9 kg)  ?05/24/21 113 lb 6.4 oz (51.4 kg)  ?03/27/21 113 lb (51.3 kg)  ?  ?Weight is stable ? ?Neuropathy ?Seen by Neurology ?Patient had some Parathesis in her Legs ?She declined EMG right now ?On B 12 Supplements for low B12 levles ?Anxiety ?Sees Dr Toy Care on Remeron,Zoloft and Trazodone to help her sleep ? ?Also h/o Hypertension, CKD and GERD ? ?Lives in [place] in [facility] ? ?Past Medical History:  ?Diagnosis Date  ? Anxiety   ? Arthritis   ? "qwhere; but it doesn't hurt" (05/12/2012)  ? Aspiration pneumonia ([place]) 03/21/2016  ? Cataract   ? left  ? Compression fracture of first lumbar vertebra ([place]) 07/2016  ? superior endplate  ? Depression   ? Dysphagia   ? "have had it for 7 yr; got worse 4 days ago" (05/12/2012)  ? Dysphagia, pharyngoesophageal phase - suspected 05/10/2012  ? GERD (gastroesophageal reflux disease)   ? Hypertension   ? Insomnia   ? Lumbago with sciatica, right side   ? Migraines   ? "menopause cured them" (05/12/2012)  ? Osteoporosis   ? Stage 3 chronic kidney disease ([place])   ?  Uterine prolapse   ? ? ?Past Surgical History:  ?Procedure Laterality Date  ? CATARACT EXTRACTION W/ INTRAOCULAR LENS IMPLANT Left ? 2010  ? ESOPHAGEAL MANOMETRY N/A 11/08/2018  ? Procedure: ESOPHAGEAL MANOMETRY (EM);  Surgeon: Gatha Mayer, MD;  Location: [facility];  Service: Endoscopy;  Laterality: N/A;  ? ESOPHAGOGASTRODUODENOSCOPY N/A 05/10/2012  ? Procedure: ESOPHAGOGASTRODUODENOSCOPY (EGD);  Surgeon: Jerene Bears, MD;  Location: [facility];  Service: Gastroenterology;  Laterality: N/A;  ? TONSILLECTOMY  1940's  ? ? ?Allergies  ?Allergen Reactions  ? Lactose Intolerance (Gi) Other (See Comments)  ?  Gi tract issues  ? Lactulose Other (See Comments)  ?  Gi tract issues  ? Sulfa Antibiotics Hives  ?  Childhood allergy   ? ? ?Outpatient Encounter Medications as of 05/29/2021  ?Medication Sig  ? amLODipine (NORVASC) 10 MG tablet Take 10 mg by mouth daily.  ? atenolol (TENORMIN) 50 MG tablet Take 50 mg by mouth daily.  ? esomeprazole (NEXIUM) 40 MG capsule Take 1 capsule (40 mg total) by mouth every morning. Can open the capsule and take with Apple sauce if having issue swallowing  ? famotidine (PEPCID) 40 MG tablet Take 40 mg by mouth at bedtime.  ? hydrocortisone cream 1 % Apply 1 application  topically daily. For dry skin on sides of mouth  ? LORazepam (ATIVAN) 0.5 MG tablet Take 0.5-1 mg by mouth See admin instructions. Take 1/2 tablet in the morning, 1 tablets (0.5) in the afternoon, and 1/2 tablets (0.25) at night  ? mirtazapine (REMERON) 30 MG tablet Take 30 mg by mouth at bedtime.  ? sertraline (ZOLOFT) 20 MG/ML concentrated solution Take by mouth daily.  ? traZODone (DESYREL) 50 MG tablet Take 50 mg by mouth at bedtime.  ? vitamin B-12 (CYANOCOBALAMIN) 1000 MCG tablet Take 1 tablet (1,000 mcg total) by mouth daily.  ? [DISCONTINUED] Omeprazole POWD Take 40 mg by mouth every morning.  ? [DISCONTINUED] busPIRone (BUSPAR) 5 MG tablet Take 5 mg by mouth 2 (two) times daily. (Patient not taking: Reported on  05/24/2021)  ? ?No facility-administered encounter medications on file as of 05/29/2021.  ? ? ?Review of Systems:  ?Review of Systems  ?Constitutional:  Positive for activity change. Negative for appetite change.  ?HENT: Negative.    ?Respiratory:  Negative for cough and shortness of breath.   ?Cardiovascular:  Negative for leg swelling.  ?Gastrointestinal:  Positive for nausea. Negative for constipation.  ?Genitourinary:  Positive for frequency and urgency.  ?Musculoskeletal:  Negative for arthralgias, gait problem and myalgias.  ?Skin: Negative.   ?Neurological:  Negative for dizziness and weakness.  ?Psychiatric/Behavioral:  Positive for dysphoric mood. Negative for confusion and sleep disturbance. The patient is nervous/anxious.   ? ?Health Maintenance  ?Topic Date Due  ? Zoster Vaccines- Shingrix (1 of 2) Never done  ? DEXA SCAN  Never done  ? INFLUENZA VACCINE  09/24/2021  ? TETANUS/TDAP  12/19/2027  ? Pneumonia Vaccine 29+ Years old  Completed  ? COVID-19 Vaccine  Completed  ? HPV VACCINES  Aged Out  ? ? ?Physical Exam: ?Vitals:  ? 05/29/21 1338  ?BP: (!) 148/77  ?Pulse: 97  ?Temp: 97.7 ?F (36.5 ?C)  ?SpO2: 100%  ?Weight: 112 lb 4.8 oz (50.9 kg)  ?Height: 5' (1.524 m)  ? ?Body mass index is 21.93 kg/m?Marland Kitchen ?Physical Exam ?Vitals reviewed.  ?Constitutional:   ?   Appearance: Normal appearance.  ?HENT:  ?   Head: Normocephalic.  ?   Nose: Nose normal.  ?   Mouth/Throat:  ?   Mouth: Mucous membranes are moist.  ?   Pharynx: Oropharynx is clear.  ?Eyes:  ?   Pupils: Pupils are equal, round, and reactive to light.  ?Cardiovascular:  ?   Rate and Rhythm: Normal rate and regular rhythm.  ?   Pulses: Normal pulses.  ?   Heart sounds: Normal heart sounds. No murmur heard. ?Pulmonary:  ?   Effort: Pulmonary effort is normal.  ?   Breath sounds: Normal breath sounds.  ?Abdominal:  ?   General: Abdomen is flat. Bowel sounds are normal.  ?   Palpations: Abdomen is soft.  ?Musculoskeletal:     ?   General: No swelling.  ?    Cervical back: Neck supple.  ?Skin: ?   General: Skin is warm.  ?Neurological:  ?   General: No focal deficit present.  ?   Mental Status: She is alert and oriented to person, place, and time.  ?Psychiatric:     ?   Mood and Affect: Mood normal.     ?   Thought Content: Thought content normal.  ? ? ?Labs reviewed: ?Basic Metabolic Panel: ?Recent Labs  ?  03/27/21 ?1027  ?NA 142  ?K 3.7  ?CL 104  ?  CO2 32  ?GLUCOSE 107*  ?BUN 24*  ?CREATININE 0.92  ?CALCIUM 9.5  ? ?Liver Function Tests: ?Recent Labs  ?  03/27/21 ?0981  ?AST 14  ?ALT 16  ?ALKPHOS 44  ?BILITOT 0.7  ?PROT 6.7  ?ALBUMIN 4.1  ? ?No results for input(s): LIPASE, AMYLASE in the last 8760 hours. ?No results for input(s): AMMONIA in the last 8760 hours. ?CBC: ?Recent Labs  ?  03/27/21 ?1914  ?WBC 8.0  ?NEUTROABS 6.0  ?HGB 12.9  ?HCT 38.6  ?MCV 88.3  ?PLT 251.0  ? ?Lipid Panel: ?No results for input(s): CHOL, HDL, LDLCALC, TRIG, CHOLHDL, LDLDIRECT in the last 8760 hours. ?Lab Results  ?Component Value Date  ? HGBA1C 5.8 (H) 04/23/2015  ? ? ?Procedures since last visit: ?No results found. ? ?Assessment/Plan ?1. Nausea ?? Etiology SO far work up has been negative  ?EGD did show some Mild Inflammation ?Will start her back on Omeprazole ?Try sprinkles as unable to swallow Pills ?? Can also be due to trazodone will discus about tapering that next visit if she agrees ?Weight is stable ?CT negative ? ? ?2. Recurrent major depressive disorder, in partial remission ([place]) ?On Zoloft Remeron and Trazadone ?Also takes ativan PRn ?3. Primary hypertension ?Stable on Amlodipine and Tenormin ? ?4. Neuropathy ?On B 12 ?Seen Neurology ?No More work up right now ? ?5. Generalized skin lesions ?Referal made to facility Dermatology ? ?6. Bowel and bladder incontinence ?Working with Therapy for [employer] ? ?7. Primary osteoarthritis involving multiple joints ? ? ?8. Stage 3a chronic kidney disease ([place]) ?Creat stable ? ? ? ?Labs/tests ordered:  * No order type specified * ?Next  appt:  05/29/2021 ? ? ? ? ?

## 2021-06-01 ENCOUNTER — Encounter: Payer: Self-pay | Admitting: Internal Medicine

## 2021-06-04 ENCOUNTER — Ambulatory Visit: Payer: Medicare Other

## 2021-06-04 DIAGNOSIS — R279 Unspecified lack of coordination: Secondary | ICD-10-CM

## 2021-06-04 DIAGNOSIS — M6281 Muscle weakness (generalized): Secondary | ICD-10-CM | POA: Diagnosis not present

## 2021-06-04 NOTE — Therapy (Addendum)
OUTPATIENT PHYSICAL THERAPY TREATMENT NOTE   Patient Name: Savannah Diaz MRN: [id number] DOB:January 08, 1933, 88 y.o., female Today's Date: 06/04/2021  PCP: Virgie Dad, MD REFERRING PROVIDER: Gatha Mayer, MD   PT End of Session - 06/04/21 1403     Visit Number 7    Date for PT Re-Evaluation 06/26/21    Authorization Type Medicare    Progress Note Due on Visit 10    PT Start Time 1401    PT Stop Time 1441    PT Time Calculation (min) 40 min    Activity Tolerance Patient tolerated treatment well    Behavior During Therapy [facility] for tasks assessed/performed               Past Medical History:  Diagnosis Date   Anxiety    Arthritis    "qwhere; but it doesn't hurt" (05/12/2012)   Aspiration pneumonia ([place]) 03/21/2016   Cataract    left   Compression fracture of first lumbar vertebra ([place]) 07/2016   superior endplate   Depression    Dysphagia    "have had it for 7 yr; got worse 4 days ago" (05/12/2012)   Dysphagia, pharyngoesophageal phase - suspected 05/10/2012   GERD (gastroesophageal reflux disease)    Hypertension    Insomnia    Lumbago with sciatica, right side    Migraines    "menopause cured them" (05/12/2012)   Osteoporosis    Stage 3 chronic kidney disease ([place])    Uterine prolapse    Past Surgical History:  Procedure Laterality Date   CATARACT EXTRACTION W/ INTRAOCULAR LENS IMPLANT Left ? 2010   ESOPHAGEAL MANOMETRY N/A 11/08/2018   Procedure: ESOPHAGEAL MANOMETRY (EM);  Surgeon: Gatha Mayer, MD;  Location: [facility];  Service: Endoscopy;  Laterality: N/A;   ESOPHAGOGASTRODUODENOSCOPY N/A 05/10/2012   Procedure: ESOPHAGOGASTRODUODENOSCOPY (EGD);  Surgeon: Jerene Bears, MD;  Location: [facility];  Service: Gastroenterology;  Laterality: N/A;   TONSILLECTOMY  1940's   Patient Active Problem List   Diagnosis Date Noted   High risk medication use 08/08/2016   UTI (urinary tract infection) 03/22/2016   Influenza A 03/22/2016   Fall 03/21/2016    Acute on chronic kidney failure II-III 03/21/2016   Transient alteration of awareness 05/10/2015   Hypokalemia 05/12/2012   Dysphagia oropharyngeal and pharyngeal 05/10/2012   Hypertension 04/28/2011   Osteoarthritis 04/28/2011   Agoraphobia 04/28/2011    REFERRING DIAG: R32,R15.9 (ICD-10-CM) - Urinary and fecal incontinence   THERAPY DIAG:  Muscle weakness (generalized)  Unspecified lack of coordination  PERTINENT HISTORY: Chronic nausea, Bil neuropathy, anxiety  PRECAUTIONS: None  SUBJECTIVE: Pt has been to see new PCP and prescribed new medication to help reduce acid in stomach. She has been doing the modified thomas stretch, but feels like she has pulled a muscle in the left side of her abdomen. She has been attempting bladder retraining. She states that she has been discouraged about being prescribed another medication and not seeing any change yet. She states that she has not been good with exercises this week.   BOWEL MOVEMENT Pain with bowel movement: No Type of bowel movement:Frequency 4x/day Fully empty rectum: No Leakage: Yes: trying to make it to the bathroom for subsequent trips with urgency - reports no control Pads: No Fiber supplement: Yes: Metamucil   URINATION Pain with urination: No Fully empty bladder: No, has to empty shortly after sometimes Stream: Strong Urgency: No Frequency: Depends on if she is nervous or distracted, she is able  to make it 2 hours sometimes, but also feels like she is not drinking enough Leakage:  Sometimes from getting out of bed to the toilet Pads: No   INTERCOURSE Pain with intercourse:  not sexually active   PREGNANCY Vaginal deliveries 2 Tearing Yes: - C-section deliveries 0 Currently pregnant No   PRECAUTIONS: None   WEIGHT BEARING RESTRICTIONS No   FALLS:  Has patient fallen in last 6 months? No, Number of falls: 0   LIVING ENVIRONMENT: Lives with: lives with their family and lives in an assisted living  facility Lives in: House/apartment Stairs: No;  Has following equipment at home: None   OCCUPATION: reitred   PLOF: Independent   PATIENT GOALS To have normal bowel movements without fecal incontinence     OBJECTIVE at eval 04/24/2021:      COGNITION:            Overall cognitive status: Within functional limits for tasks assessed                          Exam 04/24/21  PALPATION: Internal Pelvic Floor No tenderness to palpation   External Perineal Exam: White/pale plaques noted on Lt labia minora (2 o'clock) and Bil labia minor 4-8 o'clock   GENERAL No abdominal tension or tenderness palpated; sternocostal rib angle >90 degrees with moderate mobility with diaphragmatic breathing, but very effortful   POSTURE: rounded shoulders/forward head, thoracic kyphosis, posterior pelvic tilt       PELVIC MMT:   MMT   04/17/2021  Vaginal 2/5, endurance 4 seconds, reps 3   Internal Anal Sphincter  2/5  External Anal Sphincter  2/5  Puborectalis  2/5  Diastasis Recti    (Blank rows = not tested)       Treatment 06/04/21: Manual: Soft tissue mobilization: Lt abdomen - rib attachments Scar tissue mobilization: Myofascial release: Spinal mobilization: Internal pelvic floor techniques: Dry needling: Neuromuscular re-education: Core retraining:  Core facilitation: Supine resisted march isometric 3 x 10 Form correction: Pelvic floor contraction training: Down training: Exercises: Stretches/mobility: Modified thomas stretch 60 sec bil Strengthening: Heel raises 2 x 10 3-way kick 10x bil Therapeutic activities: Functional strengthening activities: Self-care:    Treatment 05/27/21: Manual: Soft tissue mobilization:lumbar paraspinals, obliques, QL Scar tissue mobilization:  Myofascial release: lumbar paraspinals with massage tool Spinal mobilization: Internal pelvic floor techniques: Dry needling: Neuromuscular re-education: Core retraining:  Core  facilitation: Form correction: Pelvic floor contraction training: Down training: Exercises: Stretches/mobility: Modified thomas stretch 60 sec bil Strengthening: Straight leg raise 2 x 10 bil Standing march on airex Therapeutic activities: Functional strengthening activities: Self-care: Bladder retraining   Treatment 05/23/21: Manual: Soft tissue mobilization: Scar tissue mobilization: Myofascial release:  lumbar paraspinals with massage tool Spinal mobilization: Internal pelvic floor techniques: Dry needling: Neuromuscular re-education: Core retraining:  Core facilitation: Form correction: Pelvic floor contraction training: Down training: Exercises: Stretches/mobility: Seated trunk rotation with horizontal abduction 10x bil LTR 2 x 10 Strengthening: Bridge with hip adduction 2 x 10 Seated clam green band 2 x 10 Band pull downs 2 x 10 red band Therapeutic activities: Functional strengthening activities: Self-care:      PATIENT EDUCATION:  Education details: Strict bladder retraining and exercise progressions.  Person educated: Patient Education method: Explanation, Demonstration, Tactile cues, Verbal cues, and Handouts Education comprehension: verbalized understanding     HOME EXERCISE PROGRAM: [id number]   ASSESSMENT:   CLINICAL IMPRESSION: Pt continue to struggle with progress and has been more discouraged over  the last week. She has not seen any improvements in incontinence over the last week, but also has not been working on contractions at all. Soft tissue mobilization performed to Lt upper quadrant with good decrease in restriction and discomfort; believe she may have strained abdominal muscle with thomas stretch coming up without enough core support - we practiced this today without pain or difficulty. We discussed trying to focus more on functional strengthening exercises in order to help core/pelvic floor get stronger since she is not seeing much  progress with overall condition. Believe frequent urination with bowel movements in the morning may be playing a role in bladder retraining.  She will continue to benefit from skilled PT intervention in order to decrease urinary/fecal incontinence and help improve bowel evacuation.      OBJECTIVE IMPAIRMENTS decreased activity tolerance, decreased coordination, decreased endurance, decreased mobility, decreased ROM, decreased strength, hypomobility, increased fascial restrictions, increased muscle spasms, impaired flexibility, postural dysfunction, and pain.    ACTIVITY LIMITATIONS community activity.    PERSONAL FACTORS 1-2 comorbidities: 2 vaginal deliveries, chronic nausea, peripheral neuropathy  are also affecting patient's functional outcome.      REHAB POTENTIAL: Fair   CLINICAL DECISION MAKING: Stable/uncomplicated   EVALUATION COMPLEXITY: Low     GOALS: Goals reviewed with patient? Yes   SHORT TERM GOALS:   STG Name Target Date Goal status  1 Pt will be independent with HEP.  Baseline:  05/15/2021 MET  2 Pt will be able to teach back and utilize urge suppression technique in order to help reduce fecal incontinence.   Baseline:  05/15/2021 MET  3 Pt will be able to correctly perform diaphragmatic breathing and appropriate pressure management in order to prevent worsening vaginal wall laxity and improve pelvic floor A/ROM.  Baseline: 05/15/2021 IN PROGRESS  4 Pt will be independent with use of squatty potty, relaxed toileting mechanics, and improved bowel movement techniques in order to increase ease of bowel movements and complete evacuation.  Baseline: 05/15/2021 IN PROGRESS                               LONG TERM GOALS:    LTG Name Target Date Goal status  1 Pt will be independent with advanced HEP.  Baseline: 07/16/2021 IN PROGRESS  2 Pt will demonstrate normal pelvic floor muscle tone and A/ROM, able to achieve 4/5 strength with contractions and 10 sec endurance, in  order to provide appropriate lumbopelvic support in functional activities.  Baseline: 06/26/2021 IN PROGRESS  3 Pt will report decrease in frequency of bowel movement to 1-2x/day with more complete evacuation. Baseline: 06/26/2021 IN PROGRESS  4 Pt will report no episodes of urinary or fecal incontinence in order to improve confidence in community activities and personal hygiene. Baseline: 06/26/2021 IN PROGRESS                               PLAN: PT FREQUENCY: 1x/week   PT DURATION: 10 weeks   PLANNED INTERVENTIONS: Therapeutic exercises, Therapeutic activity, Neuro Muscular re-education, Balance training, Gait training, Patient/Family education, Joint mobilization, and Dry Needling   PLAN FOR NEXT SESSION: Continue manual techniques to low back and abdomen to progress mobility; focus on functional strengthening exercises.    Heather Roberts, PT, DPT04/11/232:45 PM  PHYSICAL THERAPY DISCHARGE SUMMARY  Visits from Start of Care: 7  Current functional level related to goals / functional outcomes:  Incomplete   Remaining deficits: See above   Education / Equipment: HEP   Patient agrees to discharge. Patient goals were not met. Patient is being discharged due to not returning since the last visit.  Heather Roberts, PT, DPT05/24/2311:44 AM

## 2021-06-06 ENCOUNTER — Telehealth: Payer: Self-pay | Admitting: *Deleted

## 2021-06-06 MED ORDER — FAMOTIDINE 40 MG PO TABS: 40.0000 mg | ORAL_TABLET | Freq: Every day | ORAL | 2 refills | Status: DC

## 2021-06-06 NOTE — Telephone Encounter (Addendum)
Patient states she need a new prescription with Dr. Steve Rattler name on it to [facility]. ? ?Made patient aware she can get it over the counter, but she need it delivered. ?

## 2021-06-06 NOTE — Addendum Note (Signed)
Addended by: Charlyne Petrin on: 06/06/2021 01:17 PM ? ? Modules accepted: Orders ? ?

## 2021-06-06 NOTE — Telephone Encounter (Signed)
Patient called and stated that she is a new patient with Dr. Lyndel Safe and she Prescribed her Esomeprazole.  ?Patient is also currently taking Famotidine prescribed by Previous Dr.  ? ?Patient is wanting to know should she be taking both of these, The Esomeprazole and the Famotidine. (Both medications are in current medication list) ? ?Please Advise.  ?

## 2021-06-06 NOTE — Telephone Encounter (Signed)
Patient notified and agreed.  

## 2021-06-06 NOTE — Telephone Encounter (Signed)
Yes she is suppose to be on both for now ?

## 2021-06-06 NOTE — Addendum Note (Signed)
Addended by: Georgina Snell on: 06/06/2021 02:35 PM ? ? Modules accepted: Orders ? ?

## 2021-06-07 ENCOUNTER — Encounter: Payer: Self-pay | Admitting: Internal Medicine

## 2021-06-07 DIAGNOSIS — F4323 Adjustment disorder with mixed anxiety and depressed mood: Secondary | ICD-10-CM | POA: Diagnosis not present

## 2021-06-10 ENCOUNTER — Encounter: Payer: Self-pay | Admitting: Internal Medicine

## 2021-06-11 ENCOUNTER — Telehealth: Payer: Self-pay

## 2021-06-11 ENCOUNTER — Telehealth (INDEPENDENT_AMBULATORY_CARE_PROVIDER_SITE_OTHER): Payer: Medicare Other | Admitting: Nurse Practitioner

## 2021-06-11 DIAGNOSIS — R32 Unspecified urinary incontinence: Secondary | ICD-10-CM

## 2021-06-11 DIAGNOSIS — R110 Nausea: Secondary | ICD-10-CM

## 2021-06-11 DIAGNOSIS — F3341 Major depressive disorder, recurrent, in partial remission: Secondary | ICD-10-CM

## 2021-06-11 DIAGNOSIS — R159 Full incontinence of feces: Secondary | ICD-10-CM | POA: Diagnosis not present

## 2021-06-11 NOTE — Progress Notes (Signed)
? ? ?Careteam: ?Patient Care Team: ?Virgie Dad, MD as PCP - General (Internal Medicine) ?Patient, No Pcp Per (Inactive) (General Practice) ?Alda Berthold, DO as Consulting Physician (Neurology) ? ?Advanced Directive information ?  ? ?Allergies  ?Allergen Reactions  ? Lactose Intolerance (Gi) Other (See Comments)  ?  Gi tract issues  ? Lactulose Other (See Comments)  ?  Gi tract issues  ? Sulfa Antibiotics Hives  ?  Childhood allergy   ? ? ?Chief Complaint  ?Patient presents with  ? Acute Visit  ?  Patient complains of nausea and is requesting referral. Patient has been experiencing nausea for over a year. Patient would like referral to GI doctor because of the nausea. Patient was given proton pump inhibitors that didn't seem to work. Some days worse than other days. Patient has discomfort in abdominal area. Patient wakes up nauseated. Patient doesn't seem to want to eat lately.  ? ? ? ?HPI: Patient is a 88 y.o. female for ongoing nausea.  ?She has been under the care of Dr Carlean Purl and had an endoscopy and with chronic nausea.  ?She has been on anti-nausea medication and then was on PPI, then was referred to Dr Carlean Purl who tried different medication and had endoscopy (without acute findings to explain nausea) she did have an esophageal stricture and this was stretched.  ?She saw Dr Lyndel Safe, who tried another medication which has not helped.  ?She reports she is getting desperate because of the ongoing nausea. ' ?She was tested for B12 def and was positive, has been taking vit b12 1000 mcg  ?She has recently started on Vit D.  ? ?She never has thrown up. Reports her stomach is bloated. Has bough bigger pants because she can not stand anything tight around her.  ? ?She has bowel and bladder incontinence.  ? ?She is trying all the ideas her family and providers are giving her. She has a lot of bowel urgency. She is taking metamucil daily. Has been going to physical therapy for pelvic floor.  ? ?She was placed on  famotidine and was unsure why. She does not have any feeling of increase in acid, indigestion. A lot of the medications make her stomach feel worse.  ? ?She is weaning herself off lorazepam down to half tablet  ?She has been on trazodone, Remeron, zoloft for sometime. - most recently was the addition of zoloft.  ?  ? ?Review of Systems:  ?Review of Systems  ?Constitutional:  Negative for chills, fever and weight loss.  ?Respiratory:  Negative for cough, sputum production and shortness of breath.   ?Cardiovascular:  Negative for chest pain, palpitations and leg swelling.  ?Gastrointestinal:  Positive for abdominal pain and nausea. Negative for constipation, diarrhea and heartburn.  ?Skin: Negative.   ?Neurological:  Negative for dizziness and headaches.  ?Psychiatric/Behavioral:  Positive for depression. Negative for memory loss. The patient does not have insomnia.   ? ?Past Medical History:  ?Diagnosis Date  ? Anxiety   ? Arthritis   ? "qwhere; but it doesn't hurt" (05/12/2012)  ? Aspiration pneumonia ([place]) 03/21/2016  ? Cataract   ? left  ? Compression fracture of first lumbar vertebra ([place]) 07/2016  ? superior endplate  ? Depression   ? Dysphagia   ? "have had it for 7 yr; got worse 4 days ago" (05/12/2012)  ? Dysphagia, pharyngoesophageal phase - suspected 05/10/2012  ? GERD (gastroesophageal reflux disease)   ? Hypertension   ? Insomnia   ?  Lumbago with sciatica, right side   ? Migraines   ? "menopause cured them" (05/12/2012)  ? Osteoporosis   ? Stage 3 chronic kidney disease ([place])   ? Uterine prolapse   ? ?Past Surgical History:  ?Procedure Laterality Date  ? CATARACT EXTRACTION W/ INTRAOCULAR LENS IMPLANT Left ? 2010  ? ESOPHAGEAL MANOMETRY N/A 11/08/2018  ? Procedure: ESOPHAGEAL MANOMETRY (EM);  Surgeon: Gatha Mayer, MD;  Location: [facility];  Service: Endoscopy;  Laterality: N/A;  ? ESOPHAGOGASTRODUODENOSCOPY N/A 05/10/2012  ? Procedure: ESOPHAGOGASTRODUODENOSCOPY (EGD);  Surgeon: Jerene Bears, MD;   Location: [facility];  Service: Gastroenterology;  Laterality: N/A;  ? TONSILLECTOMY  1940's  ? ?Social History: ?  reports that she has never smoked. She has never used smokeless tobacco. She reports that she does not currently use alcohol. She reports that she does not use drugs. ? ?Family History  ?Problem Relation Age of Onset  ? Arthritis Mother   ? Hypertension Mother   ? Dementia Mother   ? Colon cancer Father   ? Stomach cancer Daughter   ? Esophageal cancer Neg Hx   ? Pancreatic cancer Neg Hx   ? ? ?Medications: ?Patient's Medications  ?New Prescriptions  ? No medications on file  ?Previous Medications  ? AMLODIPINE (NORVASC) 10 MG TABLET    Take 10 mg by mouth daily.  ? ATENOLOL (TENORMIN) 50 MG TABLET    Take 50 mg by mouth daily.  ? ESOMEPRAZOLE (NEXIUM) 40 MG CAPSULE    Take 1 capsule (40 mg total) by mouth every morning. Can open the capsule and take with Apple sauce if having issue swallowing  ? FAMOTIDINE (PEPCID) 40 MG TABLET    Take 1 tablet (40 mg total) by mouth at bedtime.  ? HYDROCORTISONE CREAM 1 %    Apply 1 application topically daily. For dry skin on sides of mouth  ? LORAZEPAM (ATIVAN) 0.5 MG TABLET    Take 0.5-1 mg by mouth See admin instructions. Take 1/2 tablet in the morning, 1 tablets (0.5) in the afternoon, and 1/2 tablets (0.25) at night  ? MIRTAZAPINE (REMERON) 30 MG TABLET    Take 30 mg by mouth at bedtime.  ? SERTRALINE (ZOLOFT) 20 MG/ML CONCENTRATED SOLUTION    Take by mouth daily.  ? TRAZODONE (DESYREL) 50 MG TABLET    Take 50 mg by mouth at bedtime.  ? VITAMIN B-12 (CYANOCOBALAMIN) 1000 MCG TABLET    Take 1 tablet (1,000 mcg total) by mouth daily.  ?Modified Medications  ? No medications on file  ?Discontinued Medications  ? No medications on file  ? ? ?Physical Exam: ? ?There were no vitals filed for this visit. ?There is no height or weight on file to calculate BMI. ?Wt Readings from Last 3 Encounters:  ?05/29/21 112 lb 4.8 oz (50.9 kg)  ?05/24/21 113 lb 6.4 oz (51.4 kg)   ?03/27/21 113 lb (51.3 kg)  ? ? ?Physical Exam ?Constitutional:   ?   Appearance: Normal appearance.  ?Pulmonary:  ?   Effort: Pulmonary effort is normal.  ?Neurological:  ?   Mental Status: She is alert. Mental status is at baseline.  ?Psychiatric:     ?   Mood and Affect: Mood normal.  ? ? ?Labs reviewed: ?Basic Metabolic Panel: ?Recent Labs  ?  03/27/21 ?5465  ?NA 142  ?K 3.7  ?CL 104  ?CO2 32  ?GLUCOSE 107*  ?BUN 24*  ?CREATININE 0.92  ?CALCIUM 9.5  ? ?Liver Function Tests: ?Recent  Labs  ?  03/27/21 ?1157  ?AST 14  ?ALT 16  ?ALKPHOS 44  ?BILITOT 0.7  ?PROT 6.7  ?ALBUMIN 4.1  ? ?No results for input(s): LIPASE, AMYLASE in the last 8760 hours. ?No results for input(s): AMMONIA in the last 8760 hours. ?CBC: ?Recent Labs  ?  03/27/21 ?2536  ?WBC 8.0  ?NEUTROABS 6.0  ?HGB 12.9  ?HCT 38.6  ?MCV 88.3  ?PLT 251.0  ? ?Lipid Panel: ?No results for input(s): CHOL, HDL, LDLCALC, TRIG, CHOLHDL, LDLDIRECT in the last 8760 hours. ?TSH: ?No results for input(s): TSH in the last 8760 hours. ?A1C: ?Lab Results  ?Component Value Date  ? HGBA1C 5.8 (H) 04/23/2015  ? ? ? ?Assessment/Plan ?1. Chronic nausea ?-reviewed GI notes and pt reports GI workup without finding, she has been on antinausea medication and multiple PPI which some make her stomach/nausea worse.  ?She does not feel like she has any indigestion or acid reflux.  ?-will have her stop famotidine at this time and then titrate off nexium. She has follow up scheduled with Dr Lyndel Safe in 1 week to re-assess.  ? ?2. Recurrent major depressive disorder, in partial remission ([place]) ?-she is currently on trazodone, remeron and zoloft for depression, ?if combination of medication increasing nausea.  ? ?3. Bowel and bladder incontinence ?-continues with PT and fiber supplement ? ? ?Next appt 1 week with PCP, and as needed  ?Carlos American. Dewaine Oats, AGNP ? ?[facility] ?[phone]  ? ? ?Virtual Visit via mychart ? ?I connected with patient on 06/11/21 at  1:40  PM EDT by video and verified that I am speaking with the correct person using two identifiers. ? ?Location: ?Patient: home ?Provider: [facility]  ?  ?I discussed the limitations, risks, security and privacy

## 2021-06-11 NOTE — Telephone Encounter (Signed)
[name], brummitt are scheduled for a virtual visit with your provider today.   ? ?Just as we do with appointments in the office, we must obtain your consent to participate.  Your consent will be active for this visit and any virtual visit you may have with one of our providers in the next 365 days.   ? ?If you have a MyChart account, I can also send a copy of this consent to you electronically.  All virtual visits are billed to your insurance company just like a traditional visit in the office.  As this is a virtual visit, video technology does not allow for your provider to perform a traditional examination.  This may limit your provider's ability to fully assess your condition.  If your provider identifies any concerns that need to be evaluated in person or the need to arrange testing such as labs, EKG, etc, we will make arrangements to do so.   ? ?Although advances in technology are sophisticated, we cannot ensure that it will always work on either your end or our end.  If the connection with a video visit is poor, we may have to switch to a telephone visit.  With either a video or telephone visit, we are not always able to ensure that we have a secure connection.   I need to obtain your verbal consent now.   Are you willing to proceed with your visit today?  ? ?Savannah Diaz has provided verbal consent on 06/11/2021 for a virtual visit (video or telephone). ? ? ?Carroll Kinds, CMA ?06/11/2021  1:53 PM ?  ?

## 2021-06-11 NOTE — Progress Notes (Signed)
This service is provided via telemedicine ? ?No vital signs collected/recorded due to the encounter was a telemedicine visit.  ? ?Location of patient (ex: home, work):  Home ? ?Patient consents to a telephone visit:  Yes, see encounter dated 06/11/2021 ? ?Location of the provider (ex: office, home):  [place] ? ?Name of any referring provider:  Veleta Miners, MD ? ?Names of all persons participating in the telemedicine service and their role in the encounter:  Sherrie Mustache, Nurse Practitioner, Carroll Kinds, CMA, and patient.  ? ?Time spent on call:  11 minutes with medical assistant ? ?

## 2021-06-12 ENCOUNTER — Encounter: Payer: Self-pay | Admitting: Internal Medicine

## 2021-06-12 ENCOUNTER — Encounter: Payer: Medicare Other | Admitting: Internal Medicine

## 2021-06-13 ENCOUNTER — Encounter: Payer: Self-pay | Admitting: Internal Medicine

## 2021-06-13 ENCOUNTER — Telehealth: Payer: Self-pay

## 2021-06-13 NOTE — Telephone Encounter (Signed)
Savannah Diaz and daughter have sent multiple My Chart Messages in the last 24 hours: Spoke to Savannah Diaz: Savannah Diaz states that she has been dealing with this over a year: Savannah Diaz states that she is having abdominal pain, nausea, Bloating, Belching, Loss of appetite ( Lost 2 pounds recently) Savannah Diaz stated that she has been doing research and is questioning Gastroparesis, FODMAP diet. Savannah Diaz was notified that Dr. Carlean Purl is out of the office this week but will return on Monday: ?Savannah Diaz verbalized understanding with all questions answered.  ?Please advise  ? ?

## 2021-06-14 ENCOUNTER — Telehealth: Payer: Self-pay

## 2021-06-14 ENCOUNTER — Encounter: Payer: Self-pay | Admitting: Internal Medicine

## 2021-06-14 NOTE — Telephone Encounter (Signed)
Pt was schedule for an office visit with Dr. Carlean Purl on 06/20/2021 at 2:50 PM: Pt made aware: Address provided: ?Pt verbalized understanding with all questions answered.  ? ?

## 2021-06-15 DIAGNOSIS — R8290 Unspecified abnormal findings in urine: Secondary | ICD-10-CM | POA: Diagnosis not present

## 2021-06-15 DIAGNOSIS — R109 Unspecified abdominal pain: Secondary | ICD-10-CM | POA: Diagnosis not present

## 2021-06-15 DIAGNOSIS — R1084 Generalized abdominal pain: Secondary | ICD-10-CM | POA: Diagnosis not present

## 2021-06-17 ENCOUNTER — Other Ambulatory Visit: Payer: Self-pay | Admitting: Family Medicine

## 2021-06-17 DIAGNOSIS — R1084 Generalized abdominal pain: Secondary | ICD-10-CM

## 2021-06-19 ENCOUNTER — Ambulatory Visit
Admission: RE | Admit: 2021-06-19 | Discharge: 2021-06-19 | Disposition: A | Discharge status: Home / Self Care | Payer: Medicare Other | Source: Ambulatory Visit | Attending: Family Medicine | Admitting: Family Medicine

## 2021-06-19 ENCOUNTER — Encounter: Payer: Medicare Other | Admitting: Internal Medicine

## 2021-06-19 DIAGNOSIS — K7689 Other specified diseases of liver: Secondary | ICD-10-CM | POA: Diagnosis not present

## 2021-06-19 DIAGNOSIS — R1084 Generalized abdominal pain: Secondary | ICD-10-CM

## 2021-06-19 DIAGNOSIS — N281 Cyst of kidney, acquired: Secondary | ICD-10-CM | POA: Diagnosis not present

## 2021-06-20 ENCOUNTER — Encounter: Payer: Self-pay | Admitting: Internal Medicine

## 2021-06-20 ENCOUNTER — Ambulatory Visit (INDEPENDENT_AMBULATORY_CARE_PROVIDER_SITE_OTHER): Payer: Medicare Other | Admitting: Internal Medicine

## 2021-06-20 VITALS — BP 114/68 | HR 69 | Ht 60.0 in | Wt 110.0 lb

## 2021-06-20 DIAGNOSIS — R1013 Epigastric pain: Secondary | ICD-10-CM | POA: Diagnosis not present

## 2021-06-20 DIAGNOSIS — K7689 Other specified diseases of liver: Secondary | ICD-10-CM

## 2021-06-20 DIAGNOSIS — R194 Change in bowel habit: Secondary | ICD-10-CM

## 2021-06-20 DIAGNOSIS — F418 Other specified anxiety disorders: Secondary | ICD-10-CM | POA: Diagnosis not present

## 2021-06-20 DIAGNOSIS — R1084 Generalized abdominal pain: Secondary | ICD-10-CM

## 2021-06-20 DIAGNOSIS — R630 Anorexia: Secondary | ICD-10-CM

## 2021-06-20 NOTE — Patient Instructions (Addendum)
You will be contacted by [facility] in the next 2 days to arrange a CT abdomin and pelvis with contrast.  The number on your caller ID will be [phone], please answer when they call.  If you have not heard from them in 2 days please call [phone] to schedule.   ? ?You have been scheduled for a CT scan of the abdomen and pelvis at [facility], 1st floor Radiology. You are scheduled on _______________  at __________________. You should arrive 15 minutes prior to your appointment time for registration.  . The solution may taste better if refrigerated, but do NOT add ice or any other liquid to this solution. Shake well before drinking.  ? ?Please follow the written instructions below on the day of your exam:  ? ?1) Do not eat anything after ___________________ (4 hours prior to your test)  ? ?2) Drink 1 bottle of contrast @ _______________________ (2 hours prior to your exam)  Remember to shake well before drinking and do NOT pour over ice. ?    Drink 1 bottle of contrast @ _________________________ (1 hour prior to your exam)  ? ?You may take any medications as prescribed with a small amount of water, if necessary. If you take any of the following medications: METFORMIN, GLUCOPHAGE, GLUCOVANCE, AVANDAMET, RIOMET, FORTAMET, ACTOPLUS MET, JANUMET, [place] or METAGLIP, you MAY be asked to HOLD this medication 48 hours AFTER the exam.  ? ?The purpose of you drinking the oral contrast is to aid in the visualization of your intestinal tract. The contrast solution may cause some diarrhea. Depending on your individual set of symptoms, you may also receive an intravenous injection of x-ray contrast/dye. Plan on being at [facility] for 45 minutes or longer, depending on the type of exam you are having performed.  ? ?If you have any questions regarding your exam or if you need to reschedule, you may call [facility] at [phone] between the hours of 8:00 am and 5:00 pm,  Monday-Friday.  ? ? ? ?Try and take one teaspoon of benefiber daily. ? ?Take your dicyclomine as follows: take 1 to 2 daily as needed only. ? ?I appreciate the opportunity to care for you. ?Silvano Rusk, MD, [facility] ? ?

## 2021-06-20 NOTE — Progress Notes (Signed)
? ?RAIVEN BELIZAIRE 88 y.o. [birth year] [id number] ? ?Assessment & Plan:  ? ?Encounter Diagnoses  ?Name Primary?  ? Altered bowel habits Yes  ? Dyspepsia   ? Generalized abdominal pain   ? Anorexia   ? Liver cyst   ? Anxiety about health   ? ?The patient continues to feel unwell without clear cause.  What she describes as nausea is probably anorexia and that is intermittent but causes her great distress.  There is concomitant anxiety over her health.  She has a liver cyst that was called complex on recent ultrasound and may be growing based upon the dimensions on the ultrasound versus CT from last year.  Given all of her problems underwent a repeat a CT of the abdomen pelvis with contrast.  I explained to the patient and her daughter who was present today that she may need an MRI of the liver depending upon what is seen but I felt like a CT scan would give [place] a better overall evaluation. ? ?I understand why dicyclomine was prescribed I am a little concerned about that given her age and I have suggested she use that only as needed and not regularly as she was.  I do not think she had major side effects from that but would like to avoid any. ? ?She was taking Metamucil anymore, I am going to have her try a teaspoon of Benefiber to see if that can provide more consistency to her defecation patterns and stool. ? ? ?She will continue with pelvic floor physical therapy. ? ? ?Depending upon how things go I may need to perform a colonoscopy to rule out problems and perhaps to reassure this lady who remains somewhat worried about the fact that her elderly brother had colon cancer. ? ? ?I did introduce the concept of a palliative care consult.  Depending upon the work-up I do here that may be beneficial.  She does have a psychotherapist/counselor and is seeing psychiatry but asked me about additional psychiatric recommendations but I told her I really did not have any.  I think she is on her second psychiatrist here in  [place]. ? ?CC: Virgie Dad, MD ? ? ?Subjective:  ? ?Chief Complaint: Multiple issues, lower abdominal pain with bloating, nausea (question anorexia).  Change in bowel habits. ? ?HPI ?88 year old widowed white woman here with her daughter, with persistent complaints of dyspepsia generalized abdominal discomfort and bloating.  She complains of nausea but I think she really needs anorexia type symptoms.  She is frustrated about the persistence of problems and lately she has had intensification of lower abdominal pain.  It was quite severe over the weekend and she went to the [facility] walk-in clinic where she had a CBC CMET lipase and amylase which were all normal.  She was prescribed dicyclomine 10 mg to 20 mg 3 times daily as needed and has been taking it several times a day.  She is a little better she is not sure if that is helping her.  When she was seen in February she had many similar complaints though maybe not as severe regarding abdominal pain.  She has had "ropey stools".  She has sent me some pictures via MyChart.  No bleeding.  Stools are variable in consistency and size they do not have persistent decreased caliber.  She remains concerned because her elderly brother had colon cancer.  She is fearful of death.  Sleeps poorly at times.  I have recommended medications to be taken  through her psychiatrist and she has tried mirtazapine and is now down to 15 mg daily because the 30 mg did not seem to change things with respect of nausea and appetite and she wanted to reduce the dose.  Like many therapeutic interventions she is not sure if that has helped her.  She does have persistent symptoms so perhaps not.  She also has a history of esophageal dysmotility though is not complaining so much about that today.  She had a virtual visit or a video visit with Conservation officer, historic buildings and in May had her hold her famotidine.  She has been on PPI and H2 blocker for a long time with questionable  benefit.  She also has urge urinary and urge fecal incontinence.  She has been taking some pelvic floor physical therapy with Edwina Barth ? ?EGD 01/04/2021 Dr. Ardis Hughs ?Proximal esophageal web dilated with scope, dysmotility changes, antral gastritis-no H pylori ? ? ? ?Ultrasound abdomen 06/19/2021 ?IMPRESSION: ?There is a complex cystic lesion within the right hepatic lobe. This ?may correspond with the cyst identified on recent abdominal CT. ?Given the internal complexity, recommend follow-up ultrasound in 3 ?months. ?  ?No cholelithiasis or sonographic evidence for acute cholecystitis. ? ?CT abdomen pelvis with contrast May 2022-indication abdominal discomfort and nausea ?IMPRESSION: ?Bilateral renal cysts, benign (Bosniak I). ?12 mm right medial hepatic lobe cyst ?  ?Sigmoid diverticulosis, without evidence of diverticulitis. ?  ?Moderate superior endplate compression fracture deformity at L1, ?chronic, without retropulsion. ?  ?No CT findings to account for the patient's acute symptoms. ?  ?  ?Wt Readings from Last 3 Encounters:  ?06/20/21 110 lb (49.9 kg)  ?05/29/21 112 lb 4.8 oz (50.9 kg)  ?05/24/21 113 lb 6.4 oz (51.4 kg)  ?June 2022 105 pounds ? ?Lab Results  ?Component Value Date  ? WBC 8.0 03/27/2021  ? HGB 12.9 03/27/2021  ? HCT 38.6 03/27/2021  ? MCV 88.3 03/27/2021  ? PLT 251.0 03/27/2021  ? ? ?Lab Results  ?Component Value Date  ? CREATININE 0.92 03/27/2021  ? BUN 24 (H) 03/27/2021  ? NA 142 03/27/2021  ? K 3.7 03/27/2021  ? CL 104 03/27/2021  ? CO2 32 03/27/2021  ? ?Lab Results  ?Component Value Date  ? ALT 16 03/27/2021  ? AST 14 03/27/2021  ? ALKPHOS 44 03/27/2021  ? BILITOT 0.7 03/27/2021  ? ? ?Allergies  ?Allergen Reactions  ? Lactose Intolerance (Gi) Other (See Comments)  ?  Gi tract issues  ? Lactulose Other (See Comments)  ?  Gi tract issues  ? Sulfa Antibiotics Hives  ?  Childhood allergy   ? ?Current Meds  ?Medication Sig  ? amLODipine (NORVASC) 10 MG tablet Take 10 mg by mouth daily.  ?  atenolol (TENORMIN) 50 MG tablet Take 50 mg by mouth daily.  ? Cholecalciferol (VITAMIN D3) 25 MCG (1000 UT) CAPS Take by mouth daily.  ? esomeprazole (NEXIUM) 40 MG capsule Take 1 capsule (40 mg total) by mouth every morning. Can open the capsule and take with Apple sauce if having issue swallowing  ? famotidine (PEPCID) 40 MG tablet Take 1 tablet (40 mg total) by mouth at bedtime.  ? mirtazapine (REMERON) 30 MG tablet Take 30 mg by mouth at bedtime. Patient states taking 15 mg  ? sertraline (ZOLOFT) 20 MG/ML concentrated solution Take by mouth daily.  ? traZODone (DESYREL) 50 MG tablet Take 50 mg by mouth at bedtime.  ? vitamin B-12 (CYANOCOBALAMIN) 1000 MCG tablet Take 1  tablet (1,000 mcg total) by mouth daily.  ? ?Past Medical History:  ?Diagnosis Date  ? Anxiety   ? Arthritis   ? "qwhere; but it doesn't hurt" (05/12/2012)  ? Aspiration pneumonia ([place]) 03/21/2016  ? Cataract   ? left  ? Compression fracture of first lumbar vertebra ([place]) 07/2016  ? superior endplate  ? Depression   ? Dysphagia   ? "have had it for 7 yr; got worse 4 days ago" (05/12/2012)  ? Dysphagia, pharyngoesophageal phase - suspected 05/10/2012  ? GERD (gastroesophageal reflux disease)   ? Hypertension   ? Insomnia   ? Lumbago with sciatica, right side   ? Migraines   ? "menopause cured them" (05/12/2012)  ? Osteoporosis   ? Stage 3 chronic kidney disease ([place])   ? Uterine prolapse   ? ?Past Surgical History:  ?Procedure Laterality Date  ? CATARACT EXTRACTION W/ INTRAOCULAR LENS IMPLANT Left ? 2010  ? ESOPHAGEAL MANOMETRY N/A 11/08/2018  ? Procedure: ESOPHAGEAL MANOMETRY (EM);  Surgeon: Gatha Mayer, MD;  Location: [facility];  Service: Endoscopy;  Laterality: N/A;  ? ESOPHAGOGASTRODUODENOSCOPY N/A 05/10/2012  ? Procedure: ESOPHAGOGASTRODUODENOSCOPY (EGD);  Surgeon: Jerene Bears, MD;  Location: [facility];  Service: Gastroenterology;  Laterality: N/A;  ? TONSILLECTOMY  1940's  ? ?Social History  ? ?Social History Narrative  ? Widowed,  retired, 2 daughters husband was a Mining engineer at [employer]  ? Her daughters and son-in-law's are all philosophy professors to it Fredda Hammed, daughter here at [facility] [employer] son-in-law at [employer]  ? Live

## 2021-06-21 DIAGNOSIS — F4323 Adjustment disorder with mixed anxiety and depressed mood: Secondary | ICD-10-CM | POA: Diagnosis not present

## 2021-06-22 ENCOUNTER — Encounter: Payer: Self-pay | Admitting: Internal Medicine

## 2021-06-24 ENCOUNTER — Emergency Department (HOSPITAL_COMMUNITY)
Admission: EM | Admit: 2021-06-24 | Discharge: 2021-06-25 | Disposition: A | Discharge status: Home / Self Care | Payer: Medicare Other | Attending: Emergency Medicine | Admitting: Emergency Medicine

## 2021-06-24 ENCOUNTER — Emergency Department (HOSPITAL_COMMUNITY): Payer: Medicare Other

## 2021-06-24 ENCOUNTER — Encounter: Payer: Medicare Other | Admitting: Orthopedic Surgery

## 2021-06-24 ENCOUNTER — Other Ambulatory Visit: Payer: Self-pay

## 2021-06-24 DIAGNOSIS — Z79899 Other long term (current) drug therapy: Secondary | ICD-10-CM | POA: Diagnosis not present

## 2021-06-24 DIAGNOSIS — R300 Dysuria: Secondary | ICD-10-CM | POA: Insufficient documentation

## 2021-06-24 DIAGNOSIS — K7689 Other specified diseases of liver: Secondary | ICD-10-CM | POA: Diagnosis not present

## 2021-06-24 DIAGNOSIS — R630 Anorexia: Secondary | ICD-10-CM | POA: Insufficient documentation

## 2021-06-24 DIAGNOSIS — R1030 Lower abdominal pain, unspecified: Secondary | ICD-10-CM

## 2021-06-24 DIAGNOSIS — R110 Nausea: Secondary | ICD-10-CM | POA: Diagnosis not present

## 2021-06-24 DIAGNOSIS — K5730 Diverticulosis of large intestine without perforation or abscess without bleeding: Secondary | ICD-10-CM | POA: Diagnosis not present

## 2021-06-24 DIAGNOSIS — R1032 Left lower quadrant pain: Secondary | ICD-10-CM | POA: Insufficient documentation

## 2021-06-24 DIAGNOSIS — N281 Cyst of kidney, acquired: Secondary | ICD-10-CM | POA: Diagnosis not present

## 2021-06-24 DIAGNOSIS — I700 Atherosclerosis of aorta: Secondary | ICD-10-CM | POA: Diagnosis not present

## 2021-06-24 DIAGNOSIS — R109 Unspecified abdominal pain: Secondary | ICD-10-CM | POA: Diagnosis not present

## 2021-06-24 LAB — CBC
HCT: 43.4 % (ref 36.0–46.0)
Hemoglobin: 14.1 g/dL (ref 12.0–15.0)
MCH: 29.8 pg (ref 26.0–34.0)
MCHC: 32.5 g/dL (ref 30.0–36.0)
MCV: 91.8 fL (ref 80.0–100.0)
Platelets: 277 10*3/uL (ref 150–400)
RBC: 4.73 MIL/uL (ref 3.87–5.11)
RDW: 13.6 % (ref 11.5–15.5)
WBC: 9.3 10*3/uL (ref 4.0–10.5)
nRBC: 0 % (ref 0.0–0.2)

## 2021-06-24 LAB — URINALYSIS, ROUTINE W REFLEX MICROSCOPIC
Bilirubin Urine: NEGATIVE
Glucose, UA: NEGATIVE mg/dL
Hgb urine dipstick: NEGATIVE
Ketones, ur: NEGATIVE mg/dL
Leukocytes,Ua: NEGATIVE
Nitrite: NEGATIVE
Protein, ur: NEGATIVE mg/dL
Specific Gravity, Urine: 1.008 (ref 1.005–1.030)
pH: 6 (ref 5.0–8.0)

## 2021-06-24 LAB — COMPREHENSIVE METABOLIC PANEL
ALT: 24 U/L (ref 0–44)
AST: 20 U/L (ref 15–41)
Albumin: 4.2 g/dL (ref 3.5–5.0)
Alkaline Phosphatase: 46 U/L (ref 38–126)
Anion gap: 9 (ref 5–15)
BUN: 16 mg/dL (ref 8–23)
CO2: 26 mmol/L (ref 22–32)
Calcium: 9.6 mg/dL (ref 8.9–10.3)
Chloride: 106 mmol/L (ref 98–111)
Creatinine, Ser: 1 mg/dL (ref 0.44–1.00)
GFR, Estimated: 55 mL/min — ABNORMAL LOW (ref >60)
Glucose, Bld: 105 mg/dL — ABNORMAL HIGH (ref 70–99)
Potassium: 3.5 mmol/L (ref 3.5–5.1)
Sodium: 141 mmol/L (ref 135–145)
Total Bilirubin: 0.9 mg/dL (ref 0.3–1.2)
Total Protein: 7.1 g/dL (ref 6.5–8.1)

## 2021-06-24 MED ORDER — IOHEXOL 300 MG/ML  SOLN
100.0000 mL | Freq: Once | INTRAMUSCULAR | Status: AC | PRN
  Administered 2021-06-24: 100 mL via INTRAVENOUS

## 2021-06-24 NOTE — ED Notes (Signed)
Patient did ?

## 2021-06-24 NOTE — ED Notes (Signed)
Patient given applesauce to take antibiotics.  ?

## 2021-06-24 NOTE — ED Provider Triage Note (Signed)
Emergency Medicine Provider Triage Evaluation Note ? ?Savannah Diaz , a 88 y.o. female  was evaluated in triage.  Pt complains of abdominal pain.  Patient present with daughter who provides some history.  The patient has been placed on ciprofloxacin since Friday for suspected UTI, had cultures drawn.  Patient also has decreased appetite over the last year.  Patient been seen by multiple specialists foot she described as a "movement" in her lower abdomen.  Patient states that this morning she woke up with increased cramping and pain in her lower abdomen.  Patient also complaining of burning in her vagina which is not related to urination.  Patient was set to have CT scan of abdomen performed however she states that this morning her presentation acutely changed and she is concerned.  Patient denies any fever, chills, shortness of breath, chest pain, nausea, vomiting. ? ?Review of Systems  ?Positive:  ?Negative:  ? ?Physical Exam  ?BP (!) 160/89   Pulse 70   Temp 98.2 ?F (36.8 ?C) (Oral)   Resp 16   SpO2 98%  ?Gen:   Awake, no distress   ?Resp:  Normal effort  ?MSK:   Moves extremities without difficulty  ?Other:   ? ?Medical Decision Making  ?Medically screening exam initiated at 5:44 PM.  Appropriate orders placed.  Savannah Diaz was informed that the remainder of the evaluation will be completed by another provider, this initial triage assessment does not replace that evaluation, and the importance of remaining in the ED until their evaluation is complete. ? ? ?  ?Azucena Cecil, PA-C ?06/24/21 1745 ? ?

## 2021-06-24 NOTE — ED Triage Notes (Signed)
Pt presents with daughter for evaluation of continued abdominal and urinary issues.  Has been on Cipro since Friday for UTI.  Daughter reports decreasing appetite over the past year.  Pt has had multiple evals by specialists for what she describes as a "movement" she feels in her lower abdomen.  She also reports that her vagina is burning but doesn't feel this is related to the UTI.  Cramping pain intermittently as well to lower abodmen that is radiating to back today.  No fever, chills, shob, or CP ? ?

## 2021-06-24 NOTE — Telephone Encounter (Signed)
Message forwarded to Gupta, Anjali L, MD  

## 2021-06-25 NOTE — ED Provider Notes (Signed)
?[facility] ?Provider Note ? ? ?CSN: [id number] ?Arrival date & time: 06/24/21  1613 ? ?  ? ?History ? ?Chief Complaint  ?Patient presents with  ? Abdominal Pain  ? ? ?Savannah Diaz is a 88 y.o. female. ? ?Patient with history of hypertension and recent diagnosed UTI presents with abdominal pain that has been ongoing for the past few months but getting worse over the past month. Seen by GI doctor who said her nausea was caused by anorexia. Feels worse in the morning. Has gone to urgent care 2 weeks ago and a recent ED visit. Prior urine culture performed and she was placed on cipro, she has completed 3 of the 5 day course. Describes her pain as lower abdominal bilaterally. Reports that her abdomen is sensitive. Denies radiating pain but feels like a constant touch with associated tightness. Rates pain 4/10. She cannot discern any particular relieving or aggravating factors but the pain gets better as the day progresses. Has had bladder and bowel incontinence for 9 months. Endorses dysuria. Denies straining with BM and denies hematochezia. Last BM was yesterday and typically has a BM daily. Pain is better with a bowel movement. When she takes metamucil, her bowels are soft. Sometimes stool appears as pebbles. Says that she feels movement within her abdomen like when you have a baby. She has been given PPIs by previous providers which has not relieved the pain. Reports last colonoscopy was 10 years ago which she was told was normal.  ? ?The history is provided by the patient. No language interpreter was used.  ?Abdominal Pain ?Pain location:  LLQ and RLQ ?Pain quality: aching and dull   ?Pain quality: not sharp and not stabbing   ?Pain radiates to:  Does not radiate ?Onset quality:  Gradual ?Timing:  Sporadic ?Progression:  Unchanged ?Context: laxative use   ?Context: not previous surgeries, not recent illness and not recent travel   ?Relieved by:  Bowel activity ?Worsened by:   Nothing ?Ineffective treatments:  NSAIDs ?Associated symptoms: dysuria and nausea   ?Associated symptoms: no chest pain, no chills, no diarrhea, no fever, no hematemesis, no hematochezia, no hematuria, no vaginal bleeding, no vaginal discharge and no vomiting   ?Dysuria:  ?  Severity:  Moderate ?  Onset quality:  Gradual ?  Timing:  Intermittent ?  Progression:  Unchanged ?  Chronicity:  New ?Risk factors: being elderly   ? ?  ? ?Home Medications ?Prior to Admission medications   ?Medication Sig Start Date End Date Taking? Authorizing Provider  ?amLODipine (NORVASC) 10 MG tablet Take 10 mg by mouth daily.    [provider]  ?atenolol (TENORMIN) 50 MG tablet Take 50 mg by mouth daily.    [provider]  ?Cholecalciferol (VITAMIN D3) 25 MCG (1000 UT) CAPS Take by mouth daily.    [provider]  ?ciprofloxacin (CIPRO) 250 MG tablet Take 250 mg by mouth every 12 (twelve) hours. 5 day course. 06/21/21   [provider]  ?dicyclomine (BENTYL) 10 MG capsule Take 10-20 mg by mouth 3 (three) times daily as needed. 06/15/21   [provider]  ?esomeprazole (NEXIUM) 40 MG capsule Take 1 capsule (40 mg total) by mouth every morning. Can open the capsule and take with Apple sauce if having issue swallowing ?Patient taking differently: Take 40 mg by mouth every morning. 05/29/21   Virgie Dad, MD  ?famotidine (PEPCID) 40 MG tablet Take 1 tablet (40 mg total) by mouth  at bedtime. 06/06/21   Virgie Dad, MD  ?mirtazapine (REMERON) 30 MG tablet Take 30 mg by mouth at bedtime. Patient states taking 15 mg 09/13/17   [provider]  ?sertraline (ZOLOFT) 20 MG/ML concentrated solution Take by mouth daily.    [provider]  ?traZODone (DESYREL) 50 MG tablet Take 50 mg by mouth at bedtime.    [provider]  ?vitamin B-12 (CYANOCOBALAMIN) 1000 MCG tablet Take 1 tablet (1,000 mcg total) by mouth daily. 03/28/21   Gatha Mayer, MD  ?   ? ?Allergies    ?Lactose  intolerance (gi), Lactulose, and Sulfa antibiotics   ? ?Review of Systems   ?Review of Systems  ?Constitutional:  Negative for chills and fever.  ?Cardiovascular:  Negative for chest pain.  ?Gastrointestinal:  Positive for abdominal pain and nausea. Negative for diarrhea, hematemesis, hematochezia and vomiting.  ?Genitourinary:  Positive for dysuria. Negative for hematuria, vaginal bleeding and vaginal discharge.  ? ?Physical Exam ?Updated Vital Signs ?BP (!) 145/87   Pulse 75   Temp 98.2 ?F (36.8 ?C) (Oral)   Resp (!) 21   SpO2 96%  ?Physical Exam ?Vitals reviewed.  ?Constitutional:   ?   General: She is not in acute distress. ?   Appearance: She is well-groomed. She is not ill-appearing.  ?HENT:  ?   Head: Normocephalic and atraumatic.  ?Cardiovascular:  ?   Rate and Rhythm: Normal rate and regular rhythm.  ?   Heart sounds: No murmur heard. ?  No gallop.  ?Pulmonary:  ?   Effort: Pulmonary effort is normal. No respiratory distress.  ?   Breath sounds: Normal breath sounds. No wheezing, rhonchi or rales.  ?Abdominal:  ?   General: Abdomen is flat. Bowel sounds are normal. There is no distension.  ?   Palpations: Abdomen is soft. There is no hepatomegaly.  ?   Tenderness: There is no abdominal tenderness. There is no right CVA tenderness or left CVA tenderness.  ?   Hernia: No hernia is present.  ?Skin: ?   General: Skin is warm.  ?   Findings: No erythema or rash.  ?Neurological:  ?   General: No focal deficit present.  ?   Mental Status: She is alert.  ? ? ?ED Results / Procedures / Treatments   ?Labs ?(all labs ordered are listed, but only abnormal results are displayed) ?Labs Reviewed  ?COMPREHENSIVE METABOLIC PANEL - Abnormal; Notable for the following components:  ?    Result Value  ? Glucose, Bld 105 (*)   ? GFR, Estimated 55 (*)   ? All other components within normal limits  ?CBC  ?URINALYSIS, ROUTINE W REFLEX MICROSCOPIC  ? ? ?EKG ?None ? ?Radiology ?CT ABDOMEN PELVIS W CONTRAST ? ?Result Date:  06/24/2021 ?CLINICAL DATA:  Acute abdominal pain. EXAM: CT ABDOMEN AND PELVIS WITH CONTRAST TECHNIQUE: Multidetector CT imaging of the abdomen and pelvis was performed using the standard protocol following bolus administration of intravenous contrast. RADIATION DOSE REDUCTION: This exam was performed according to the departmental dose-optimization program which includes automated exposure control, adjustment of the mA and/or kV according to patient size and/or use of iterative reconstruction technique. CONTRAST:  129m OMNIPAQUE IOHEXOL 300 MG/ML  SOLN COMPARISON:  CT abdomen and pelvis 07/17/2020. FINDINGS: Lower chest: No acute abnormality. Hepatobiliary: There is a stable 12 mm cyst in the right lobe of the liver. Gallbladder and bile ducts are within normal limits. Pancreas: Unremarkable. No pancreatic ductal dilatation or surrounding inflammatory  changes. Spleen: Normal in size without focal abnormality. Adrenals/Urinary Tract: Bilateral renal cysts are present. The largest cyst is on the right measuring 3.4 cm. There is no hydronephrosis or perinephric fat stranding. The adrenal glands and bladder are within normal limits. Stomach/Bowel: Stomach is within normal limits. Appendix appears normal. No evidence of bowel wall thickening, distention, or inflammatory changes. There is diffuse colonic diverticulosis without evidence for acute diverticulitis. Vascular/Lymphatic: Aortic atherosclerosis. No enlarged abdominal or pelvic lymph nodes. Reproductive: Uterus and bilateral adnexa are unremarkable. Other: No abdominal wall hernia or abnormality. No abdominopelvic ascites. Musculoskeletal: L1 compression deformity is chronic and unchanged. IMPRESSION: 1. No acute localizing process in the abdomen or pelvis. 2. Colonic diverticulosis without evidence for acute diverticulitis. 3. Hepatic and renal cysts. Electronically Signed   By: Ronney Asters M.D.   On: 06/24/2021 21:06   ? ?Procedures ?Procedures  ? ? ?Medications  Ordered in ED ?Medications  ?iohexol (OMNIPAQUE) 300 MG/ML solution 100 mL (100 mLs Intravenous Contrast Given 06/24/21 2059)  ? ? ?ED Course/ Medical Decision Making/ A&P ?  ?                        ?Medical Decis

## 2021-06-25 NOTE — Progress Notes (Signed)
[facility] encountered pt.'s dtr in ED hallway requesting help removing EKG leads so pt. could use the restroom.  Staff occupied in emergent situation in adjoining room; [facility] checked in w/pt. intermittently until staff were able to assist. ?

## 2021-06-25 NOTE — Discharge Instructions (Addendum)
You came to the emergency department due to your abdominal pain. Your work up here all looked good, your CT showed diverticulosis. As we discussed, these are pockets within the colon that can form. Incorporating fiber into your diet and having regular bowel movements. Please make sure to follow up with your primary care doctor and the gastroenterologist.  ?

## 2021-06-26 ENCOUNTER — Encounter: Payer: Self-pay | Admitting: Internal Medicine

## 2021-06-26 ENCOUNTER — Non-Acute Institutional Stay: Payer: Medicare Other | Admitting: Internal Medicine

## 2021-06-26 ENCOUNTER — Encounter: Payer: Medicare Other | Admitting: Internal Medicine

## 2021-06-26 VITALS — BP 142/76 | HR 82 | Temp 97.6°F | Ht 60.0 in | Wt 112.9 lb

## 2021-06-26 DIAGNOSIS — N1831 Chronic kidney disease, stage 3a: Secondary | ICD-10-CM

## 2021-06-26 DIAGNOSIS — R300 Dysuria: Secondary | ICD-10-CM | POA: Diagnosis not present

## 2021-06-26 DIAGNOSIS — R110 Nausea: Secondary | ICD-10-CM

## 2021-06-26 DIAGNOSIS — F332 Major depressive disorder, recurrent severe without psychotic features: Secondary | ICD-10-CM

## 2021-06-26 DIAGNOSIS — M159 Polyosteoarthritis, unspecified: Secondary | ICD-10-CM | POA: Diagnosis not present

## 2021-06-26 DIAGNOSIS — I10 Essential (primary) hypertension: Secondary | ICD-10-CM

## 2021-06-26 DIAGNOSIS — R159 Full incontinence of feces: Secondary | ICD-10-CM | POA: Diagnosis not present

## 2021-06-26 DIAGNOSIS — R1084 Generalized abdominal pain: Secondary | ICD-10-CM

## 2021-06-26 DIAGNOSIS — R32 Unspecified urinary incontinence: Secondary | ICD-10-CM

## 2021-06-26 DIAGNOSIS — F419 Anxiety disorder, unspecified: Secondary | ICD-10-CM

## 2021-06-26 MED ORDER — LORAZEPAM 0.5 MG PO TABS
0.5000 mg | ORAL_TABLET | Freq: Two times a day (BID) | ORAL | 0 refills | Status: DC | PRN
Start: 2021-06-26 — End: 2021-09-06

## 2021-06-26 NOTE — Progress Notes (Signed)
? ?Location: [facility] ?  ?Place of Service:  Clinic (12) ? ?Provider:  ? ?Code Status:  ?Goals of Care:  ? ?  06/26/2021  ?  2:16 PM  ?Advanced Directives  ?Does Patient Have a Medical Advance Directive? No  ?Would patient like information on creating a medical advance directive? No - Patient declined  ? ? ? ?Chief Complaint  ?Patient presents with  ? Medical Management of Chronic Issues  ?  Patient here for her 4 week follow up. YW  ? Quality Metric Gaps  ?  Shingrix and dexa scan due  ? ? ?HPI: Patient is a 88 y.o. female seen today for an acute visit for Follow up  ? ?Patient has h/o  ?Nausea for past 1 year ?Zofran no help  ?Seen and Followed by Dr Carlean Purl ?EGD in 11/22 Mild inflammation characterized by erythema, friability and granularity was found in the gastric antrum. ?Manometry in 09/20 Normal in EG junction and no Motility issue ?CT scan of abdomen in 05/22 ? ? ?Says she feels nauseated all the time. No vomiting no abdominal pain ?Does have h/o Urinary and Bowel incontinence and working with therapy for Pelvic Floor ?And Fiber ? ?Since last visit  seen Dr Carlean Purl again ?Repeat CT scan in ED with no acute process ?US done by Urgent care No Acute Process ? ?Daughter and patient both now  agree that she is having anxiety  and reason for her abdominal discomfort ?Daughter was able to join through the phone ?Patient has seen Dr Toy Care before but has not seen her recently ?She also had tapered herself off Ativan in past few months. During this time she has also stopped driving which has made her more Anxious and depressed ?Per patient her daughter is also going through some issues at home and patient is anxious about that. ? ?Patient was just treated for UTI with Cipro and c/o Itching in her Vaginal area and ? Hurts and White discharge ? ?Past Medical History:  ?Diagnosis Date  ? Anxiety   ? Arthritis   ? "qwhere; but it doesn't hurt" (05/12/2012)  ? Aspiration pneumonia ([place]) 03/21/2016  ? Cataract   ?  left  ? Compression fracture of first lumbar vertebra ([place]) 07/2016  ? superior endplate  ? Depression   ? Dysphagia   ? "have had it for 7 yr; got worse 4 days ago" (05/12/2012)  ? Dysphagia, pharyngoesophageal phase - suspected 05/10/2012  ? GERD (gastroesophageal reflux disease)   ? Hypertension   ? Insomnia   ? Lumbago with sciatica, right side   ? Migraines   ? "menopause cured them" (05/12/2012)  ? Osteoporosis   ? Stage 3 chronic kidney disease ([place])   ? Uterine prolapse   ? ? ?Past Surgical History:  ?Procedure Laterality Date  ? CATARACT EXTRACTION W/ INTRAOCULAR LENS IMPLANT Left ? 2010  ? ESOPHAGEAL MANOMETRY N/A 11/08/2018  ? Procedure: ESOPHAGEAL MANOMETRY (EM);  Surgeon: Gatha Mayer, MD;  Location: [facility];  Service: Endoscopy;  Laterality: N/A;  ? ESOPHAGOGASTRODUODENOSCOPY N/A 05/10/2012  ? Procedure: ESOPHAGOGASTRODUODENOSCOPY (EGD);  Surgeon: Jerene Bears, MD;  Location: [facility];  Service: Gastroenterology;  Laterality: N/A;  ? TONSILLECTOMY  1940's  ? ? ?Allergies  ?Allergen Reactions  ? Lactose Intolerance (Gi) Other (See Comments)  ?  Gi tract issues  ? Lactulose Other (See Comments)  ?  Gi tract issues  ? Sulfa Antibiotics Hives  ?  Childhood allergy   ? ? ?Outpatient Encounter  Medications as of 06/26/2021  ?Medication Sig  ? amLODipine (NORVASC) 10 MG tablet Take 10 mg by mouth daily.  ? atenolol (TENORMIN) 50 MG tablet Take 50 mg by mouth daily.  ? Cholecalciferol (VITAMIN D3) 25 MCG (1000 UT) CAPS Take by mouth daily.  ? dicyclomine (BENTYL) 10 MG capsule Take 10-20 mg by mouth 3 (three) times daily as needed.  ? esomeprazole (NEXIUM) 40 MG capsule Take 1 capsule (40 mg total) by mouth every morning. Can open the capsule and take with Apple sauce if having issue swallowing (Patient taking differently: Take 40 mg by mouth every morning.)  ? famotidine (PEPCID) 40 MG tablet Take 1 tablet (40 mg total) by mouth at bedtime.  ? LORazepam (ATIVAN) 0.5 MG tablet Take 1 tablet (0.5 mg total)  by mouth 2 (two) times daily as needed for anxiety.  ? mirtazapine (REMERON) 30 MG tablet Take 30 mg by mouth at bedtime. Patient states taking 15 mg  ? sertraline (ZOLOFT) 20 MG/ML concentrated solution Take by mouth daily.  ? traZODone (DESYREL) 50 MG tablet Take 50 mg by mouth at bedtime.  ? vitamin B-12 (CYANOCOBALAMIN) 1000 MCG tablet Take 1 tablet (1,000 mcg total) by mouth daily.  ? [DISCONTINUED] ciprofloxacin (CIPRO) 250 MG tablet Take 250 mg by mouth every 12 (twelve) hours. 5 day course.  ? ?No facility-administered encounter medications on file as of 06/26/2021.  ? ? ?Review of Systems:  ?Review of Systems  ?Constitutional:  Positive for activity change and appetite change.  ?HENT: Negative.    ?Respiratory:  Negative for cough and shortness of breath.   ?Cardiovascular:  Negative for leg swelling.  ?Gastrointestinal:  Positive for abdominal distention, abdominal pain, constipation and diarrhea.  ?Genitourinary:  Positive for dysuria.  ?Musculoskeletal:  Negative for arthralgias, gait problem and myalgias.  ?Skin: Negative.   ?Neurological:  Negative for dizziness and weakness.  ?Psychiatric/Behavioral:  Positive for dysphoric mood. Negative for confusion and sleep disturbance. The patient is nervous/anxious.   ? ?Health Maintenance  ?Topic Date Due  ? Zoster Vaccines- Shingrix (1 of 2) Never done  ? DEXA SCAN  Never done  ? INFLUENZA VACCINE  09/24/2021  ? TETANUS/TDAP  12/19/2027  ? Pneumonia Vaccine 29+ Years old  Completed  ? COVID-19 Vaccine  Completed  ? HPV VACCINES  Aged Out  ? ? ?Physical Exam: ?Vitals:  ? 06/26/21 1408  ?BP: (!) 142/76  ?Pulse: 82  ?Temp: 97.6 ?F (36.4 ?C)  ?SpO2: 95%  ?Weight: 112 lb 14.4 oz (51.2 kg)  ?Height: 5' (1.524 m)  ? ?Body mass index is 22.05 kg/m?Marland Kitchen ?Physical Exam ?Vitals reviewed.  ?Constitutional:   ?   Appearance: Normal appearance.  ?HENT:  ?   Head: Normocephalic.  ?   Nose: Nose normal.  ?   Mouth/Throat:  ?   Mouth: Mucous membranes are moist.  ?   Pharynx:  Oropharynx is clear.  ?Eyes:  ?   Pupils: Pupils are equal, round, and reactive to light.  ?Cardiovascular:  ?   Rate and Rhythm: Normal rate and regular rhythm.  ?   Pulses: Normal pulses.  ?   Heart sounds: Normal heart sounds. No murmur heard. ?Pulmonary:  ?   Effort: Pulmonary effort is normal.  ?   Breath sounds: Normal breath sounds.  ?Abdominal:  ?   General: Abdomen is flat. Bowel sounds are normal.  ?   Palpations: Abdomen is soft.  ?Genitourinary: ?   General: Normal vulva.  ?   Vagina: No  vaginal discharge.  ?   Comments: No Rash or discharge Noticed ?Musculoskeletal:     ?   General: No swelling.  ?   Cervical back: Neck supple.  ?Skin: ?   General: Skin is warm.  ?Neurological:  ?   General: No focal deficit present.  ?   Mental Status: She is alert and oriented to person, place, and time.  ?Psychiatric:     ?   Mood and Affect: Mood normal.     ?   Thought Content: Thought content normal.  ? ? ?Labs reviewed: ?Basic Metabolic Panel: ?Recent Labs  ?  03/27/21 ?6222 06/24/21 ?9798  ?NA 142 141  ?K 3.7 3.5  ?CL 104 106  ?CO2 32 26  ?GLUCOSE 107* 105*  ?BUN 24* 16  ?CREATININE 0.92 1.00  ?CALCIUM 9.5 9.6  ? ?Liver Function Tests: ?Recent Labs  ?  03/27/21 ?1157 06/24/21 ?9211  ?AST 14 20  ?ALT 16 24  ?ALKPHOS 44 46  ?BILITOT 0.7 0.9  ?PROT 6.7 7.1  ?ALBUMIN 4.1 4.2  ? ?No results for input(s): LIPASE, AMYLASE in the last 8760 hours. ?No results for input(s): AMMONIA in the last 8760 hours. ?CBC: ?Recent Labs  ?  03/27/21 ?1157 06/24/21 ?1757  ?WBC 8.0 9.3  ?NEUTROABS 6.0  --   ?HGB 12.9 14.1  ?HCT 38.6 43.4  ?MCV 88.3 91.8  ?PLT 251.0 277  ? ?Lipid Panel: ?No results for input(s): CHOL, HDL, LDLCALC, TRIG, CHOLHDL, LDLDIRECT in the last 8760 hours. ?Lab Results  ?Component Value Date  ? HGBA1C 5.8 (H) 04/23/2015  ? ? ?Procedures since last visit: ?US Abdomen Complete ? ?Result Date: 06/19/2021 ?CLINICAL DATA:  Epigastric abdominal pain. EXAM: ABDOMEN ULTRASOUND COMPLETE COMPARISON:  CT abdomen pelvis Jul 17, 2020 FINDINGS: Gallbladder: No gallstones or wall thickening visualized. No sonographic Murphy sign noted by sonographer. Common bile duct: Diameter: 7.6 mm Liver: Within the right hepatic lobe there is

## 2021-06-27 ENCOUNTER — Encounter: Payer: Self-pay | Admitting: Internal Medicine

## 2021-06-27 NOTE — Telephone Encounter (Signed)
Routed to Dr. Gupta 

## 2021-06-28 ENCOUNTER — Ambulatory Visit (HOSPITAL_COMMUNITY): Payer: Medicare Other

## 2021-06-29 ENCOUNTER — Encounter: Payer: Self-pay | Admitting: Internal Medicine

## 2021-07-01 NOTE — Telephone Encounter (Signed)
Forwarded to Dr. Lyndel Safe. I do not see a referral placed for a Dermatologist.  ?

## 2021-07-03 ENCOUNTER — Ambulatory Visit (INDEPENDENT_AMBULATORY_CARE_PROVIDER_SITE_OTHER): Payer: Medicare Other | Admitting: Obstetrics & Gynecology

## 2021-07-03 ENCOUNTER — Encounter (HOSPITAL_BASED_OUTPATIENT_CLINIC_OR_DEPARTMENT_OTHER): Payer: Self-pay | Admitting: Obstetrics & Gynecology

## 2021-07-03 VITALS — BP 159/82 | HR 76 | Ht 60.0 in | Wt 112.8 lb

## 2021-07-03 DIAGNOSIS — L659 Nonscarring hair loss, unspecified: Secondary | ICD-10-CM | POA: Diagnosis not present

## 2021-07-03 DIAGNOSIS — N952 Postmenopausal atrophic vaginitis: Secondary | ICD-10-CM | POA: Diagnosis not present

## 2021-07-03 DIAGNOSIS — L678 Other hair color and hair shaft abnormalities: Secondary | ICD-10-CM

## 2021-07-03 DIAGNOSIS — N393 Stress incontinence (female) (male): Secondary | ICD-10-CM

## 2021-07-03 DIAGNOSIS — L679 Hair color and hair shaft abnormality, unspecified: Secondary | ICD-10-CM

## 2021-07-03 DIAGNOSIS — R151 Fecal smearing: Secondary | ICD-10-CM

## 2021-07-03 NOTE — Progress Notes (Signed)
GYNECOLOGY  VISIT ? ?CC:   several concerns including urinary/fecal incontinence, vulvar skin changes, persistent nausea ? ?HPI: ?88 y.o. Widowed White or Caucasian female here for several concerns that she wants to discuss today. ? ?First, has been experiencing about anorexia for about a year.  She's been treated with reflux medications.  Hasn't felt this helped.  Does feel reflux is the correct diagnosis.  Has also been experieincing issues with abdominal bloating and discomfort with her pants.  She is more sensitive to odors as well.  States, for example, she had scallops in her home and she's been smelling this for a week.  She's been diagnosed with reflux.  Has been on femotidine for about 10 years.  Pt has seen Dr. Carlean Purl.  GT abd/pelvis showed diverticulosis, liver and renal cysts but no other abnormal finding. ? ?Feels she may have a "hormonal" imbalance as she reports more brittle and dry hair and she feels her hair is thinning.  She is concerned about increased hair on her chin despite the changes in hair on her head.  Wants some evaluation for this. ? ?Has incontinence of urine and stool.  Has done physical therapy at [facility] and also with Heather Roberts at [place].  Pt does not feel this really did anything and has not returned for additional therapy sessions.  At one of her visits, there was some concern about whitish tissue on the vulva and diagnosis of lichen sclerosus.  She was advised to be seen and evaluated for this specific concern which is why she is here today.  Denies any vulvar itching.  Denies vaginal bleeding. ? ?Her daughter accompanied her today who helped with some of the history.  Reports pt was weaned off lorazepam and some of the above symptoms did worsen significantly.  Was weaned off by Dr. Toy Care but Dr. Lyndel Safe has restarted a lower dosage for her.  Pt's daughter reports several issues seem to have improved with the medication.   ? ? ?Patient Active Problem List  ? Diagnosis  Date Noted  ? High risk medication use 08/08/2016  ? UTI (urinary tract infection) 03/22/2016  ? Influenza A 03/22/2016  ? Fall 03/21/2016  ? Acute on chronic kidney failure II-III 03/21/2016  ? Transient alteration of awareness 05/10/2015  ? Hypokalemia 05/12/2012  ? Dysphagia oropharyngeal and pharyngeal 05/10/2012  ? Hypertension 04/28/2011  ? Osteoarthritis 04/28/2011  ? Agoraphobia 04/28/2011  ? ? ?Past Medical History:  ?Diagnosis Date  ? Anxiety   ? Arthritis   ? "qwhere; but it doesn't hurt" (05/12/2012)  ? Aspiration pneumonia ([place]) 03/21/2016  ? Cataract   ? left  ? Compression fracture of first lumbar vertebra ([place]) 07/2016  ? superior endplate  ? Depression   ? Dysphagia   ? "have had it for 7 yr; got worse 4 days ago" (05/12/2012)  ? Dysphagia, pharyngoesophageal phase - suspected 05/10/2012  ? GERD (gastroesophageal reflux disease)   ? Hypertension   ? Insomnia   ? Lumbago with sciatica, right side   ? Migraines   ? "menopause cured them" (05/12/2012)  ? Osteoporosis   ? Stage 3 chronic kidney disease ([place])   ? Uterine prolapse   ? ? ?Past Surgical History:  ?Procedure Laterality Date  ? CATARACT EXTRACTION W/ INTRAOCULAR LENS IMPLANT Left ? 2010  ? ESOPHAGEAL MANOMETRY N/A 11/08/2018  ? Procedure: ESOPHAGEAL MANOMETRY (EM);  Surgeon: Gatha Mayer, MD;  Location: [facility];  Service: Endoscopy;  Laterality: N/A;  ? ESOPHAGOGASTRODUODENOSCOPY N/A  05/10/2012  ? Procedure: ESOPHAGOGASTRODUODENOSCOPY (EGD);  Surgeon: Jerene Bears, MD;  Location: [facility];  Service: Gastroenterology;  Laterality: N/A;  ? TONSILLECTOMY  1940's  ? ? ?MEDS:   ?Current Outpatient Medications on File Prior to Visit  ?Medication Sig Dispense Refill  ? amLODipine (NORVASC) 10 MG tablet Take 10 mg by mouth daily.    ? atenolol (TENORMIN) 50 MG tablet Take 50 mg by mouth daily.    ? Cholecalciferol (VITAMIN D3) 25 MCG (1000 UT) CAPS Take by mouth daily.    ? dicyclomine (BENTYL) 10 MG capsule Take 10-20 mg by mouth 3 (three)  times daily as needed.    ? famotidine (PEPCID) 40 MG tablet Take 1 tablet (40 mg total) by mouth at bedtime. 30 tablet 2  ? LORazepam (ATIVAN) 0.5 MG tablet Take 1 tablet (0.5 mg total) by mouth 2 (two) times daily as needed for anxiety. 30 tablet 0  ? mirtazapine (REMERON) 30 MG tablet Take 30 mg by mouth at bedtime. Patient states taking 15 mg  6  ? sertraline (ZOLOFT) 20 MG/ML concentrated solution Take by mouth daily.    ? traZODone (DESYREL) 50 MG tablet Take 50 mg by mouth at bedtime.    ? vitamin B-12 (CYANOCOBALAMIN) 1000 MCG tablet Take 1 tablet (1,000 mcg total) by mouth daily.    ? ?No current facility-administered medications on file prior to visit.  ? ? ?ALLERGIES: Lactose intolerance (gi), Lactulose, and Sulfa antibiotics ? ?Family History  ?Problem Relation Age of Onset  ? Arthritis Mother   ? Hypertension Mother   ? Dementia Mother   ? Colon cancer Father   ? Stomach cancer Daughter   ? Esophageal cancer Neg Hx   ? Pancreatic cancer Neg Hx   ? ? ?SH:  widowed, non smoker ? ?Review of Systems  ?Respiratory: Negative.    ?Cardiovascular: Negative.   ?Gastrointestinal:  Positive for nausea.  ?Genitourinary:   ?     Urinary incontinence  ?Psychiatric/Behavioral:  The patient is nervous/anxious.   ? ?PHYSICAL EXAMINATION:   ? ?BP (!) 159/82 (BP Location: Left Arm, Patient Position: Sitting, Cuff Size: Normal)   Pulse 76   Ht 5' (1.524 m) Comment: Reported  Wt 112 lb 12.8 oz (51.2 kg)   BMI 22.03 kg/m?     ?General appearance: alert, cooperative and appears stated age ?Abdomen: soft, non-tender; bowel sounds normal; no masses,  no organomegaly ?Lymph:  no inguinal LAD noted ? ?Pelvic: External genitalia:  no lesions, no inner labia hypopigmentation ?             Urethra:  normal appearing urethra with no masses, tenderness or lesions ?             Bartholins and Skenes: normal    ?             Vagina: atrophic tissue with some erythema at introitus c/w atrophic vaginitis ?             Cervix: no  lesions ?             Bimanual Exam:  Uterus:  normal size, contour, position, consistency, mobility, non-tender ?             Adnexa: no mass, fullness, tenderness ?             Rectovaginal: Yes.  .  Confirms. ?             Anus:  normal sphincter tone, no lesions ? ?  With exam, asked pt to tighten pelvic floor muscles with a Kegel.  She cannot isolate these muscles.  Feel she needs to continue PT. ? ?Chaperone, Octaviano Batty, CMA, was present for exam. ? ?Assessment/Plan: ?1. Alopecia ?- is likely going to need dermatology follow up as well ?- TSH ? ?2. Abnormal facial hair ?- Testosterone, Total, LC/MS/MS ? ?3. Hair changes ?- Iron, TIBC and Ferritin Panel ? ?4. Stress incontinence of urine ?- feel pt would benefit from returning to PT.  She is willing to continue trying. ? ?5. Fecal smearing ?- metamucil for stool bulking discussed to help with incontinence of stool ? ?6. Vaginal atrophy ?- as pt is not symptomatic and not SA, do not feel she needs treatment at this time ? ?Time with pt: 42 minutes, time with documentation after visit additional 8 minutes ? ? ?

## 2021-07-08 ENCOUNTER — Encounter: Payer: Self-pay | Admitting: Internal Medicine

## 2021-07-08 LAB — IRON,TIBC AND FERRITIN PANEL
Ferritin: 210 ng/mL — ABNORMAL HIGH (ref 15–150)
Iron Saturation: 17 % (ref 15–55)
Iron: 54 ug/dL (ref 27–139)
Total Iron Binding Capacity: 321 ug/dL (ref 250–450)
UIBC: 267 ug/dL (ref 118–369)

## 2021-07-08 LAB — TSH: TSH: 2.05 u[IU]/mL (ref 0.450–4.500)

## 2021-07-08 LAB — TESTOSTERONE, TOTAL, LC/MS/MS: Testosterone, total: 10.7 ng/dL (ref 7.0–40.0)

## 2021-07-13 ENCOUNTER — Encounter (HOSPITAL_BASED_OUTPATIENT_CLINIC_OR_DEPARTMENT_OTHER): Payer: Self-pay | Admitting: Obstetrics & Gynecology

## 2021-07-16 ENCOUNTER — Other Ambulatory Visit: Payer: Self-pay | Admitting: Internal Medicine

## 2021-07-16 DIAGNOSIS — F419 Anxiety disorder, unspecified: Secondary | ICD-10-CM

## 2021-07-16 DIAGNOSIS — F332 Major depressive disorder, recurrent severe without psychotic features: Secondary | ICD-10-CM

## 2021-07-17 ENCOUNTER — Other Ambulatory Visit (HOSPITAL_BASED_OUTPATIENT_CLINIC_OR_DEPARTMENT_OTHER): Payer: Self-pay | Admitting: Obstetrics & Gynecology

## 2021-07-17 DIAGNOSIS — R151 Fecal smearing: Secondary | ICD-10-CM

## 2021-07-17 DIAGNOSIS — N3946 Mixed incontinence: Secondary | ICD-10-CM

## 2021-07-18 DIAGNOSIS — D1721 Benign lipomatous neoplasm of skin and subcutaneous tissue of right arm: Secondary | ICD-10-CM | POA: Diagnosis not present

## 2021-07-18 DIAGNOSIS — L570 Actinic keratosis: Secondary | ICD-10-CM | POA: Diagnosis not present

## 2021-07-18 DIAGNOSIS — L988 Other specified disorders of the skin and subcutaneous tissue: Secondary | ICD-10-CM | POA: Diagnosis not present

## 2021-07-18 DIAGNOSIS — L821 Other seborrheic keratosis: Secondary | ICD-10-CM | POA: Diagnosis not present

## 2021-07-18 DIAGNOSIS — L820 Inflamed seborrheic keratosis: Secondary | ICD-10-CM | POA: Diagnosis not present

## 2021-07-24 ENCOUNTER — Non-Acute Institutional Stay: Payer: Medicare Other | Admitting: Internal Medicine

## 2021-07-24 ENCOUNTER — Encounter: Payer: Self-pay | Admitting: Internal Medicine

## 2021-07-24 VITALS — BP 121/71 | HR 50 | Temp 97.7°F | Ht 60.0 in | Wt 115.3 lb

## 2021-07-24 DIAGNOSIS — F332 Major depressive disorder, recurrent severe without psychotic features: Secondary | ICD-10-CM | POA: Diagnosis not present

## 2021-07-24 DIAGNOSIS — F419 Anxiety disorder, unspecified: Secondary | ICD-10-CM

## 2021-07-24 DIAGNOSIS — I10 Essential (primary) hypertension: Secondary | ICD-10-CM | POA: Diagnosis not present

## 2021-07-24 DIAGNOSIS — R110 Nausea: Secondary | ICD-10-CM | POA: Diagnosis not present

## 2021-07-24 DIAGNOSIS — R32 Unspecified urinary incontinence: Secondary | ICD-10-CM | POA: Diagnosis not present

## 2021-07-24 DIAGNOSIS — R159 Full incontinence of feces: Secondary | ICD-10-CM

## 2021-07-24 DIAGNOSIS — R1084 Generalized abdominal pain: Secondary | ICD-10-CM

## 2021-07-25 ENCOUNTER — Ambulatory Visit: Payer: Medicare Other | Admitting: Physical Therapy

## 2021-07-26 NOTE — Progress Notes (Signed)
Location: [facility] (12)  Provider:   Code Status:  Goals of Care:     07/24/2021    2:54 PM  Advanced Directives  Does Patient Have a Medical Advance Directive? No  Would patient like information on creating a medical advance directive? No - Patient declined     Chief Complaint  Patient presents with   Medical Management of Chronic Issues    Patient here today for 4 week follow up.    HPI: Patient is a 88 y.o. female seen today for Follow up  Patient has h/o  Nausea for past 1 year Zofran no help  Seen and Followed by Dr Carlean Purl EGD in 11/22 Mild inflammation characterized by erythema, friability and granularity was found in the gastric antrum. Manometry in 09/20 Normal in EG junction and no Motility issue CT scan of abdomen in 05/22 and 5/23 negative [place] n05/23 negative  She was started on ativan last visit to help with her Anxiety and it has completely resolved her abdominal pain She feels better no Nausea Eating and doing well Here for follow up Continues to work with Therapy for Urinary and Bowel incontinence Also has seen GYN and all detail work up is negative so far Was not able to establish with Psych services  Wants to make sure I renew her meds Does talk to therapist on line every week Does get tired and fatigue easily  Past Medical History:  Diagnosis Date   Anxiety    Arthritis    "qwhere; but it doesn't hurt" (05/12/2012)   Aspiration pneumonia ([place]) 03/21/2016   Cataract    left   Compression fracture of first lumbar vertebra ([place]) 07/2016   superior endplate   Depression    Dysphagia    "have had it for 7 yr; got worse 4 days ago" (05/12/2012)   Dysphagia, pharyngoesophageal phase - suspected 05/10/2012   GERD (gastroesophageal reflux disease)    Hypertension    Insomnia    Lumbago with sciatica, right side    Migraines    "menopause cured them" (05/12/2012)   Osteoporosis    Stage 3 chronic kidney  disease ([place])    Uterine prolapse     Past Surgical History:  Procedure Laterality Date   CATARACT EXTRACTION W/ INTRAOCULAR LENS IMPLANT Left ? 2010   ESOPHAGEAL MANOMETRY N/A 11/08/2018   Procedure: ESOPHAGEAL MANOMETRY (EM);  Surgeon: Gatha Mayer, MD;  Location: [facility];  Service: Endoscopy;  Laterality: N/A;   ESOPHAGOGASTRODUODENOSCOPY N/A 05/10/2012   Procedure: ESOPHAGOGASTRODUODENOSCOPY (EGD);  Surgeon: Jerene Bears, MD;  Location: [facility];  Service: Gastroenterology;  Laterality: N/A;   TONSILLECTOMY  1940's    Allergies  Allergen Reactions   Lactose Intolerance (Gi) Other (See Comments)    Gi tract issues   Lactulose Other (See Comments)    Gi tract issues   Sulfa Antibiotics Hives    Childhood allergy     Outpatient Encounter Medications as of 07/24/2021  Medication Sig   amLODipine (NORVASC) 10 MG tablet Take 10 mg by mouth daily.   atenolol (TENORMIN) 50 MG tablet Take 50 mg by mouth daily.   Cholecalciferol (VITAMIN D3) 25 MCG (1000 UT) CAPS Take by mouth daily.   famotidine (PEPCID) 40 MG tablet Take 1 tablet (40 mg total) by mouth at bedtime.   LORazepam (ATIVAN) 0.5 MG tablet Take 1 tablet (0.5 mg total) by mouth 2 (two) times daily as needed for anxiety.  mirtazapine (REMERON) 30 MG tablet Take 30 mg by mouth at bedtime. Patient states taking 15 mg   sertraline (ZOLOFT) 20 MG/ML concentrated solution Take 100 mg by mouth daily. Takes 5 ml every day   traZODone (DESYREL) 50 MG tablet Take 50 mg by mouth at bedtime.   vitamin B-12 (CYANOCOBALAMIN) 1000 MCG tablet Take 1 tablet (1,000 mcg total) by mouth daily.   [DISCONTINUED] dicyclomine (BENTYL) 10 MG capsule Take 10-20 mg by mouth 3 (three) times daily as needed.   No facility-administered encounter medications on file as of 07/24/2021.    Review of Systems:  Review of Systems  Constitutional:  Negative for activity change and appetite change.  HENT: Negative.    Respiratory:  Negative for  cough and shortness of breath.   Cardiovascular:  Negative for leg swelling.  Gastrointestinal:  Negative for constipation.  Genitourinary: Negative.   Musculoskeletal:  Negative for arthralgias, gait problem and myalgias.  Skin: Negative.   Neurological:  Positive for weakness. Negative for dizziness.  Psychiatric/Behavioral:  Positive for dysphoric mood. Negative for confusion and sleep disturbance. The patient is nervous/anxious.    Health Maintenance  Topic Date Due   Zoster Vaccines- Shingrix (1 of 2) Never done   DEXA SCAN  Never done   INFLUENZA VACCINE  09/24/2021   TETANUS/TDAP  12/19/2027   Pneumonia Vaccine 45+ Years old  Completed   COVID-19 Vaccine  Completed   HPV VACCINES  Aged Out    Physical Exam: Vitals:   07/24/21 1451  BP: 121/71  Pulse: (!) 50  Temp: 97.7 F (36.5 C)  SpO2: 95%  Weight: 115 lb 4.8 oz (52.3 kg)  Height: 5' (1.524 m)   Body mass index is 22.52 kg/m. Physical Exam Vitals reviewed.  Constitutional:      Appearance: Normal appearance.  HENT:     Head: Normocephalic.     Nose: Nose normal.     Mouth/Throat:     Mouth: Mucous membranes are moist.     Pharynx: Oropharynx is clear.  Eyes:     Pupils: Pupils are equal, round, and reactive to light.  Cardiovascular:     Rate and Rhythm: Normal rate and regular rhythm.     Pulses: Normal pulses.     Heart sounds: Normal heart sounds. No murmur heard. Pulmonary:     Effort: Pulmonary effort is normal.     Breath sounds: Normal breath sounds.  Abdominal:     General: Abdomen is flat. Bowel sounds are normal.     Palpations: Abdomen is soft.  Musculoskeletal:        General: No swelling.     Cervical back: Neck supple.  Skin:    General: Skin is warm.  Neurological:     General: No focal deficit present.     Mental Status: She is alert and oriented to person, place, and time.  Psychiatric:        Mood and Affect: Mood normal.        Thought Content: Thought content normal.     Labs reviewed: Basic Metabolic Panel: Recent Labs    03/27/21 1157 06/24/21 1757 07/03/21 1629  NA 142 141  --   K 3.7 3.5  --   CL 104 106  --   CO2 32 26  --   GLUCOSE 107* 105*  --   BUN 24* 16  --   CREATININE 0.92 1.00  --   CALCIUM 9.5 9.6  --   TSH  --   --  2.050   Liver Function Tests: Recent Labs    03/27/21 1157 06/24/21 1757  AST 14 20  ALT 16 24  ALKPHOS 44 46  BILITOT 0.7 0.9  PROT 6.7 7.1  ALBUMIN 4.1 4.2   No results for input(s): LIPASE, AMYLASE in the last 8760 hours. No results for input(s): AMMONIA in the last 8760 hours. CBC: Recent Labs    03/27/21 1157 06/24/21 1757  WBC 8.0 9.3  NEUTROABS 6.0  --   HGB 12.9 14.1  HCT 38.6 43.4  MCV 88.3 91.8  PLT 251.0 277   Lipid Panel: No results for input(s): CHOL, HDL, LDLCALC, TRIG, CHOLHDL, LDLDIRECT in the last 8760 hours. Lab Results  Component Value Date   HGBA1C 5.8 (H) 04/23/2015    Procedures since last visit: No results found.  Assessment/Plan 1. Anxiety Continue Ativan  Contract with [place]  2. Severe episode of recurrent major depressive disorder, without psychotic features ([place]) Will renew her Zoloft and Remeron when needed Takes liquid Zoloft   3. Chronic nausea Resolved On Pepcid also  4. Bowel and bladder incontinence Working with therapy Made OT referal  5. Generalized abdominal pain Negative work up so far She agrees it was related ot her Anxiety  6. Primary hypertension On Atenolol and Norvasc 7 insomnia On Trazodone   Labs/tests ordered:  * No order type specified * Next appt:  10/30/2021

## 2021-08-06 ENCOUNTER — Other Ambulatory Visit: Payer: Self-pay | Admitting: *Deleted

## 2021-08-06 MED ORDER — ATENOLOL 50 MG PO TABS: 50.0000 mg | ORAL_TABLET | Freq: Every day | ORAL | 1 refills | Status: DC

## 2021-08-06 MED ORDER — TRAZODONE HCL 50 MG PO TABS: 50.0000 mg | ORAL_TABLET | Freq: Every day | ORAL | 1 refills | Status: DC

## 2021-08-06 MED ORDER — SERTRALINE HCL 20 MG/ML PO CONC: ORAL | 3 refills | Status: DC

## 2021-08-06 MED ORDER — AMLODIPINE BESYLATE 10 MG PO TABS: 10.0000 mg | ORAL_TABLET | Freq: Every day | ORAL | 1 refills | Status: DC

## 2021-08-06 NOTE — Telephone Encounter (Signed)
Pharmacy requested refill.  Patient requested rx's to be sent to [facility].   Pended Rx's and sent to Dr. Lyndel Safe for approval due to [place] Warning.

## 2021-08-07 DIAGNOSIS — Z23 Encounter for immunization: Secondary | ICD-10-CM | POA: Diagnosis not present

## 2021-08-14 ENCOUNTER — Ambulatory Visit: Payer: Medicare Other | Attending: Obstetrics & Gynecology

## 2021-08-14 ENCOUNTER — Other Ambulatory Visit: Payer: Self-pay

## 2021-08-14 DIAGNOSIS — R151 Fecal smearing: Secondary | ICD-10-CM | POA: Insufficient documentation

## 2021-08-14 DIAGNOSIS — R279 Unspecified lack of coordination: Secondary | ICD-10-CM | POA: Diagnosis not present

## 2021-08-14 DIAGNOSIS — M6281 Muscle weakness (generalized): Secondary | ICD-10-CM | POA: Insufficient documentation

## 2021-08-14 DIAGNOSIS — N3946 Mixed incontinence: Secondary | ICD-10-CM | POA: Insufficient documentation

## 2021-08-14 NOTE — Therapy (Signed)
OUTPATIENT PHYSICAL THERAPY FEMALE PELVIC EVALUATION   Patient Name: Savannah Diaz MRN: [id number] DOB:February 05, 1933, 88 y.o., female Today's Date: 08/14/2021   PT End of Session - 08/14/21 1520     Visit Number 1    Date for PT Re-Evaluation 10/09/21    Authorization Type Medicare    Progress Note Due on Visit 10    PT Start Time 1530    PT Stop Time 1610    PT Time Calculation (min) 40 min    Activity Tolerance Patient tolerated treatment well    Behavior During Therapy [facility] for tasks assessed/performed             Past Medical History:  Diagnosis Date   Anxiety    Arthritis    "qwhere; but it doesn't hurt" (05/12/2012)   Aspiration pneumonia ([place]) 03/21/2016   Cataract    left   Compression fracture of first lumbar vertebra ([place]) 07/2016   superior endplate   Depression    Dysphagia    "have had it for 7 yr; got worse 4 days ago" (05/12/2012)   Dysphagia, pharyngoesophageal phase - suspected 05/10/2012   GERD (gastroesophageal reflux disease)    Hypertension    Insomnia    Lumbago with sciatica, right side    Migraines    "menopause cured them" (05/12/2012)   Osteoporosis    Stage 3 chronic kidney disease ([place])    Uterine prolapse    Past Surgical History:  Procedure Laterality Date   CATARACT EXTRACTION W/ INTRAOCULAR LENS IMPLANT Left ? 2010   ESOPHAGEAL MANOMETRY N/A 11/08/2018   Procedure: ESOPHAGEAL MANOMETRY (EM);  Surgeon: Gatha Mayer, MD;  Location: [facility];  Service: Endoscopy;  Laterality: N/A;   ESOPHAGOGASTRODUODENOSCOPY N/A 05/10/2012   Procedure: ESOPHAGOGASTRODUODENOSCOPY (EGD);  Surgeon: Jerene Bears, MD;  Location: [facility];  Service: Gastroenterology;  Laterality: N/A;   TONSILLECTOMY  1940's   Patient Active Problem List   Diagnosis Date Noted   High risk medication use 08/08/2016   Influenza A 03/22/2016   Fall 03/21/2016   Acute on chronic kidney failure II-III 03/21/2016   Transient alteration of awareness 05/10/2015    Hypokalemia 05/12/2012   Dysphagia oropharyngeal and pharyngeal 05/10/2012   Hypertension 04/28/2011   Osteoarthritis 04/28/2011   Agoraphobia 04/28/2011    PCP: Virgie Dad, MD  REFERRING PROVIDER: Hale Bogus, Chauncey Cruel, MD  REFERRING DIAG: Fecal smearing  - Primary R15.1 Mixed stress and urge urinary incontinence  N39.46  THERAPY DIAG:  Muscle weakness (generalized)  Unspecified lack of coordination  Rationale for Evaluation and Treatment Rehabilitation  ONSET DATE: 02/24/2021  SUBJECTIVE:  SUBJECTIVE STATEMENT: Patient states that she has been to see gynecologist and referred back to physical therapy for decreased pelvic floor motor control/strength and specific recommendation for biofeedback.  She is now back on lorazepam which dramatically helped with stomach pain for quite a while. But in the last month she has started not feeling quite as well. She is hesitant to increase her dose to see if that is helpful.   She has not been working on pelvic floor HEP because she feels very tired all the time.   She has started art class. She notices that when she is away from her apartment, she'll notice that she has not been to the bathroom for 3-4 hours.   Fluid intake: Yes: Trying to drink more water - not sure how much     PAIN:  Are you having pain? No  PRECAUTIONS: None  WEIGHT BEARING RESTRICTIONS No  FALLS:  Has patient fallen in last 6 months? No  LIVING ENVIRONMENT: Lives with: lives in a nursing home Lives in: House/apartment  OCCUPATION: Retired  PLOF: Independent  PATIENT GOALS patient wants to feel better; have control over bowels and bladder  PERTINENT HISTORY:  G2P2, chronic nausea, peripheral neuropathy, anxiety/depression Sexual abuse: No  BOWEL MOVEMENT Pain with  bowel movement: No Type of bowel movement:Frequency 3-4x a day, most comes out the first several times  and Strain No Fully empty rectum: Yes: but then she has more Leakage: Yes: small amounts that started in the last couple of weeks Pads: Yes: panty liner Fiber supplement: Yes: Metamucil  URINATION Pain with urination: No Fully empty bladder: Yes: - Stream:  WNL Urgency: No Frequency: every 3-4 hours Leakage:  None Pads: Yes: panty liner  INTERCOURSE Pain with intercourse:  Not currently sexually active   PREGNANCY Vaginal deliveries 2 Tearing Yes: - C-section deliveries 0 Currently pregnant No   OBJECTIVE:   COGNITION:  Overall cognitive status: Within functional limits for tasks assessed     SENSATION:  Light touch: Appears intact  Proprioception: Appears intact   GAIT: Comments: slow shuffling gait pattern               POSTURE: rounded shoulders, forward head, and posterior pelvic tilt    PALPATION:                External Perineal Exam vulvar redness/irritation; evidence of decrease in estrogen with labial fusion                             Internal Pelvic Floor pelvic floor atrophy  Patient confirms identification and approves PT to assess internal pelvic floor and treatment Yes  PELVIC MMT: 1/5 vaginal strength, endurance 5 seconds, 7 repeat contractions 2/5 puborectalis strength 0/5 external anal sphincter strength       TONE: low  PROLAPSE: Not assessed  TODAY'S TREATMENT  EVAL  Neuromuscular re-education: Pelvic floor contraction training: No emotional/communication barriers or cognitive limitation. Patient is motivated to learn. Patient understands and agrees with treatment goals and plan. PT explains patient will be examined in standing, sitting, and lying down to see how their muscles and joints work. When they are ready, they will be asked to remove their underwear so PT can examine their perineum. The patient is also given the option  of providing their own chaperone as one is not provided in our facility. The patient also has the right and is explained the right to defer or refuse any  part of the evaluation or treatment including the internal exam. With the patient's consent, PT will use one gloved finger to gently assess the muscles of the pelvic floor, seeing how well it contracts and relaxes and if there is muscle symmetry. After, the patient will get dressed and PT and patient will discuss exam findings and plan of care. PT and patient discuss plan of care, schedule, attendance policy and HEP activities. Pelvic floor contractions training with vaginal and rectal internal feedback Quick flicks 70Y Long holds 2 x 10 seconds    PATIENT EDUCATION:  Education details: Initial HEP; biofeedback Person educated: Patient Education method: Explanation, Demonstration, Tactile cues, Verbal cues, and Handouts Education comprehension: verbalized understanding   HOME EXERCISE PROGRAM: [id number]  ASSESSMENT:  CLINICAL IMPRESSION: Patient is a 88 y.o. female who was seen today for physical therapy evaluation and treatment for sensation of decreased bowel/bladder control and pelvic floor weakness. Exam findings notable for 1/5 anterior pelvic floor strength, 2/5 posterior pelvic floor strength, 0/5 external anal sphincter strength, and pelvic floor muscle atrophy/low tone.  Signs and symptoms are most consistent with pelvic floor weakness that most likely is worsened by decreased estrogen levels (may benefit from topical estrogen cream) and poor coordination/motor control over pelvic floor. She tolerated initial treatment of pelvic floor contraction training well and was instructed in initial HEP; we discussed performing biofeedback next treatment session to work on motor control of these muscle groups. She will benefit from skilled PT intervention in order to improve control over bowel/bladder and improve QOL.    OBJECTIVE IMPAIRMENTS  decreased coordination, decreased endurance, and decreased strength.   ACTIVITY LIMITATIONS continence  PARTICIPATION LIMITATIONS: community activity  PERSONAL FACTORS 1 comorbidity: 2 vaginal deliveries,  peripheral neuropathy  are also affecting patient's functional outcome.   REHAB POTENTIAL: Fair patient has not had success with conservative pelvic floor PT in the recent past  CLINICAL DECISION MAKING: Stable/uncomplicated  EVALUATION COMPLEXITY: Low   GOALS: Goals reviewed with patient? Yes  SHORT TERM GOALS: Target date: 09/11/2021  Pt will be independent with HEP.    LONG TERM GOALS: Target date: 10/09/2021   Pt will be independent with advanced HEP.   Baseline:  Goal status: INITIAL  2.  Pt will demonstrate normal pelvic floor muscle tone and A/ROM, able to achieve 3/5 strength with contractions and 10 sec endurance, in order to provide appropriate lumbopelvic support in functional activities.   Baseline:  Goal status: INITIAL  3.  Pt will report sensation of increased control over bowel/bladder with no episodes incontinence.  Baseline:  Goal status: INITIAL  4.  Pt will be compliant with appropriate bowel/bladder schedule and good habits in order to help improve control/continence.  Baseline:  Goal status: INITIAL   PLAN: PT FREQUENCY: 1x/week  PT DURATION: 8 weeks  PLANNED INTERVENTIONS: Therapeutic exercises, Therapeutic activity, Neuromuscular re-education, Balance training, Gait training, Patient/Family education, Joint mobilization, Dry Needling, Biofeedback, and Manual therapy  PLAN FOR NEXT SESSION: Biofeedback for improved pelvic floor motor control.    Heather Roberts, PT, DPT06/21/235:18 PM

## 2021-09-03 ENCOUNTER — Ambulatory Visit: Payer: Medicare Other | Attending: Obstetrics & Gynecology

## 2021-09-03 DIAGNOSIS — M6281 Muscle weakness (generalized): Secondary | ICD-10-CM | POA: Insufficient documentation

## 2021-09-03 DIAGNOSIS — R279 Unspecified lack of coordination: Secondary | ICD-10-CM | POA: Diagnosis not present

## 2021-09-03 NOTE — Therapy (Signed)
OUTPATIENT PHYSICAL THERAPY TREATMENT NOTE   Patient Name: Savannah Diaz MRN: [id number] DOB:05/19/1932, 88 y.o., female Today's Date: 09/03/2021  PCP: Virgie Dad, MD REFERRING PROVIDER: Gatha Mayer, MD  END OF SESSION:   PT End of Session - 09/03/21 1358     Visit Number 2    Date for PT Re-Evaluation 10/09/21    Authorization Type Medicare    Progress Note Due on Visit 10    PT Start Time 1400    PT Stop Time 1440    PT Time Calculation (min) 40 min    Activity Tolerance Patient tolerated treatment well    Behavior During Therapy [facility] for tasks assessed/performed             Past Medical History:  Diagnosis Date   Anxiety    Arthritis    "qwhere; but it doesn't hurt" (05/12/2012)   Aspiration pneumonia ([place]) 03/21/2016   Cataract    left   Compression fracture of first lumbar vertebra ([place]) 07/2016   superior endplate   Depression    Dysphagia    "have had it for 7 yr; got worse 4 days ago" (05/12/2012)   Dysphagia, pharyngoesophageal phase - suspected 05/10/2012   GERD (gastroesophageal reflux disease)    Hypertension    Insomnia    Lumbago with sciatica, right side    Migraines    "menopause cured them" (05/12/2012)   Osteoporosis    Stage 3 chronic kidney disease ([place])    Uterine prolapse    Past Surgical History:  Procedure Laterality Date   CATARACT EXTRACTION W/ INTRAOCULAR LENS IMPLANT Left ? 2010   ESOPHAGEAL MANOMETRY N/A 11/08/2018   Procedure: ESOPHAGEAL MANOMETRY (EM);  Surgeon: Gatha Mayer, MD;  Location: [facility];  Service: Endoscopy;  Laterality: N/A;   ESOPHAGOGASTRODUODENOSCOPY N/A 05/10/2012   Procedure: ESOPHAGOGASTRODUODENOSCOPY (EGD);  Surgeon: Jerene Bears, MD;  Location: [facility];  Service: Gastroenterology;  Laterality: N/A;   TONSILLECTOMY  1940's   Patient Active Problem List   Diagnosis Date Noted   High risk medication use 08/08/2016   Influenza A 03/22/2016   Fall 03/21/2016   Acute on chronic kidney  failure II-III 03/21/2016   Transient alteration of awareness 05/10/2015   Hypokalemia 05/12/2012   Dysphagia oropharyngeal and pharyngeal 05/10/2012   Hypertension 04/28/2011   Osteoarthritis 04/28/2011   Agoraphobia 04/28/2011    REFERRING DIAG: Fecal smearing  - Primary R15.1  Mixed stress and urge urinary incontinence  N39.46   THERAPY DIAG:  Muscle weakness (generalized)  Unspecified lack of coordination  Rationale for Evaluation and Treatment Rehabilitation  PERTINENT HISTORY: G2P2, chronic nausea, peripheral neuropathy, anxiety/depression  PRECAUTIONS: NA  SUBJECTIVE: Patient states that she has some questions regarding working with dilators. She recalls more specifics about bowel movements - she has two large ones in the monring and then several small ones after that each day.   PAIN:  Are you having pain? No   SUBJECTIVE:  SUBJECTIVE STATEMENT: Patient states that she has been to see gynecologist and referred back to physical therapy for decreased pelvic floor motor control/strength and specific recommendation for biofeedback.   She is now back on lorazepam which dramatically helped with stomach pain for quite a while. But in the last month she has started not feeling quite as well. She is hesitant to increase her dose to see if that is helpful.    She has not been working on pelvic floor HEP because she feels very tired all the time.    She has started art class. She notices that when she is away from her apartment, she'll notice that she has not been to the bathroom for 3-4 hours.    Fluid intake: Yes: Trying to drink more water - not sure how much       PAIN:  Are you having pain? No     PATIENT GOALS patient wants to feel better; have control over bowels and bladder     BOWEL MOVEMENT Pain with bowel movement: No Type of bowel movement:Frequency 3-4x a day, most comes out the first several times  and Strain No Fully empty rectum: Yes: but then she has more Leakage: Yes: small amounts that started in the last couple of weeks Pads: Yes: panty liner Fiber supplement: Yes: Metamucil   URINATION Pain with urination: No Fully empty bladder: Yes: - Stream:  WNL Urgency: No Frequency: every 3-4 hours Leakage:  None Pads: Yes: panty liner   INTERCOURSE Pain with intercourse:  Not currently sexually active     PREGNANCY Vaginal deliveries 2 Tearing Yes: - C-section deliveries 0 Currently pregnant No     OBJECTIVE:    COGNITION:            Overall cognitive status: Within functional limits for tasks assessed                          SENSATION:            Light touch: Appears intact            Proprioception: Appears intact     GAIT: Comments: slow shuffling gait pattern                POSTURE: rounded shoulders, forward head, and posterior pelvic tilt      PALPATION:                 External Perineal Exam vulvar redness/irritation; evidence of decrease in estrogen with labial fusion                             Internal Pelvic Floor pelvic floor atrophy   Patient confirms identification and approves PT to assess internal pelvic floor and treatment Yes   PELVIC MMT: 1/5 vaginal strength, endurance 5 seconds, 7 repeat contractions 2/5 puborectalis strength 0/5 external anal sphincter strength       TONE: low   PROLAPSE: Not assessed   TODAY'S TREATMENT 09/03/21 Neuromuscular re-education: Biofeedback: Program to work on proprioception and various contractile strength for improved coordination of pelvic floor Self-care: Reviewed purpose of dilator as not stretching, but providing feedback for improved contraction/proprioception of pelvic floor Gentle cleansing methods after bowel movements: wet wipes, peri bottle Contacting MD  about estrogen cream - we will message as well  TREATMENT 08/14/21 EVAL  Neuromuscular re-education: Pelvic floor contraction training: No emotional/communication barriers or cognitive limitation. Patient  is motivated to learn. Patient understands and agrees with treatment goals and plan. PT explains patient will be examined in standing, sitting, and lying down to see how their muscles and joints work. When they are ready, they will be asked to remove their underwear so PT can examine their perineum. The patient is also given the option of providing their own chaperone as one is not provided in our facility. The patient also has the right and is explained the right to defer or refuse any part of the evaluation or treatment including the internal exam. With the patient's consent, PT will use one gloved finger to gently assess the muscles of the pelvic floor, seeing how well it contracts and relaxes and if there is muscle symmetry. After, the patient will get dressed and PT and patient will discuss exam findings and plan of care. PT and patient discuss plan of care, schedule, attendance policy and HEP activities. Pelvic floor contractions training with vaginal and rectal internal feedback Quick flicks 06T Long holds 2 x 10 seconds       PATIENT EDUCATION:  Education details: Initial HEP; biofeedback Person educated: Patient Education method: Explanation, Demonstration, Tactile cues, Verbal cues, and Handouts Education comprehension: verbalized understanding     HOME EXERCISE PROGRAM: [id number]   ASSESSMENT:   CLINICAL IMPRESSION: Patient overall not seeing any change in condition. We performed biofeedback with sensors on external perineum this session with excellent improvements in motor control/pelvic floor coordination. She initially had very difficult time with facilitating appropriate contraction to match program, but with biofeedback and verbal cues, she was able to refine control. Believe  this may be a good treatment option moving forward. We revisited purpose of using dilator as feedback vs stretching. Due to irritation around anus, we discussed gentle cleansing methods to perineum after bowel movements. She will benefit from skilled PT intervention in order to improve control over bowel/bladder and improve QOL.      OBJECTIVE IMPAIRMENTS decreased coordination, decreased endurance, and decreased strength.    ACTIVITY LIMITATIONS continence   PARTICIPATION LIMITATIONS: community activity   PERSONAL FACTORS 1 comorbidity: 2 vaginal deliveries,  peripheral neuropathy  are also affecting patient's functional outcome.    REHAB POTENTIAL: Fair patient has not had success with conservative pelvic floor PT in the recent past   CLINICAL DECISION MAKING: Stable/uncomplicated   EVALUATION COMPLEXITY: Low     GOALS: Goals reviewed with patient? Yes   SHORT TERM GOALS: Target date: 09/11/2021   Pt will be independent with HEP.      LONG TERM GOALS: Target date: 10/09/2021    Pt will be independent with advanced HEP.    Baseline:  Goal status: INITIAL   2.  Pt will demonstrate normal pelvic floor muscle tone and A/ROM, able to achieve 3/5 strength with contractions and 10 sec endurance, in order to provide appropriate lumbopelvic support in functional activities.    Baseline:  Goal status: INITIAL   3.  Pt will report sensation of increased control over bowel/bladder with no episodes incontinence.  Baseline:  Goal status: INITIAL   4.  Pt will be compliant with appropriate bowel/bladder schedule and good habits in order to help improve control/continence.  Baseline:  Goal status: INITIAL     PLAN: PT FREQUENCY: 1x/week   PT DURATION: 8 weeks   PLANNED INTERVENTIONS: Therapeutic exercises, Therapeutic activity, Neuromuscular re-education, Balance training, Gait training, Patient/Family education, Joint mobilization, Dry Needling, Biofeedback, and Manual therapy    PLAN FOR NEXT SESSION: Biofeedback for  improved pelvic floor motor control.    Heather Roberts, PT, DPT07/11/232:45 PM

## 2021-09-05 ENCOUNTER — Ambulatory Visit (INDEPENDENT_AMBULATORY_CARE_PROVIDER_SITE_OTHER): Payer: Medicare Other | Admitting: Internal Medicine

## 2021-09-05 ENCOUNTER — Encounter: Payer: Self-pay | Admitting: Internal Medicine

## 2021-09-05 VITALS — BP 112/66 | HR 73 | Ht 60.0 in | Wt 117.0 lb

## 2021-09-05 DIAGNOSIS — F418 Other specified anxiety disorders: Secondary | ICD-10-CM

## 2021-09-05 DIAGNOSIS — R1013 Epigastric pain: Secondary | ICD-10-CM | POA: Diagnosis not present

## 2021-09-05 NOTE — Progress Notes (Signed)
Savannah Diaz 88 y.o. 05/06/1932 [id number]  Assessment & Plan:   Encounter Diagnoses  Name Primary?   Dyspepsia Yes   Anxiety about health    She is much improved.  She will return as needed.  I agree with current treatment plans.  CC: Savannah Dad, MD   Subjective:   Chief Complaint: Follow-up of abdominal pain and dyspepsia  HPI 88 year old widowed woman here with her daughter, she has had a long history of dyspepsia problems and a lot of functional GI complaints that have flared after she became widowed and moved from [place] to [facility].  Since going back on lorazepam things have significantly gotten better.  She had been having abdominal pain and some change in bowel habits when I had seen her in April and a CT scan was planned but things flared and she went to the emergency department and had the CT scan.  She is following up after that.  She is eating better and sometimes food disagrees with her some but overall no major complaints.  She had seen Dr. Sabra Heck of gynecology and was encouraged to continue pelvic PT and she is doing that with biofeedback and making some progress with fecal smearing and other pelvic floor issues.  She is having some back spasm or tightness and is hoping to pursue physical therapy for that as well.  Affect is brighter and though remains anxious that is improved per the patient and the daughter.  Generic Nexium was dropped from her regimen without problem.   CT abdomen and pelvis with contrast IMPRESSION: 1. No acute localizing process in the abdomen or pelvis. 2. Colonic diverticulosis without evidence for acute diverticulitis. 3. Hepatic and renal cysts.     Electronically Signed   By: Ronney Asters M.D.   On: 06/24/2021 21:06  Wt Readings from Last 3 Encounters:  09/05/21 117 lb (53.1 kg)  07/24/21 115 lb 4.8 oz (52.3 kg)  07/03/21 112 lb 12.8 oz (51.2 kg)     Allergies  Allergen Reactions   Lactose Intolerance (Gi)  Other (See Comments)    Gi tract issues   Lactulose Other (See Comments)    Gi tract issues   Sulfa Antibiotics Hives    Childhood allergy    Current Meds  Medication Sig   amLODipine (NORVASC) 10 MG tablet Take 1 tablet (10 mg total) by mouth daily.   atenolol (TENORMIN) 50 MG tablet Take 1 tablet (50 mg total) by mouth daily.   Cholecalciferol (VITAMIN D3) 25 MCG (1000 UT) CAPS Take by mouth daily.   famotidine (PEPCID) 40 MG tablet Take 1 tablet (40 mg total) by mouth at bedtime.   LORazepam (ATIVAN) 0.5 MG tablet Take 1 tablet (0.5 mg total) by mouth 2 (two) times daily as needed for anxiety.   mirtazapine (REMERON) 30 MG tablet Take 30 mg by mouth at bedtime. Patient states taking 15 mg   sertraline (ZOLOFT) 20 MG/ML concentrated solution Takes 5 ml by mouth once every day   traZODone (DESYREL) 50 MG tablet Take 1 tablet (50 mg total) by mouth at bedtime.   vitamin B-12 (CYANOCOBALAMIN) 1000 MCG tablet Take 1 tablet (1,000 mcg total) by mouth daily.   Past Medical History:  Diagnosis Date   Anxiety    Arthritis    "qwhere; but it doesn't hurt" (05/12/2012)   Aspiration pneumonia ([place]) 03/21/2016   Cataract    left   Compression fracture of first lumbar vertebra ([place]) 07/2016   superior  endplate   Depression    Dysphagia    "have had it for 7 yr; got worse 4 days ago" (05/12/2012)   Dysphagia, pharyngoesophageal phase - suspected 05/10/2012   GERD (gastroesophageal reflux disease)    Hypertension    Insomnia    Lumbago with sciatica, right side    Migraines    "menopause cured them" (05/12/2012)   Osteoporosis    Stage 3 chronic kidney disease ([place])    Uterine prolapse    Past Surgical History:  Procedure Laterality Date   CATARACT EXTRACTION W/ INTRAOCULAR LENS IMPLANT Left ? 2010   ESOPHAGEAL MANOMETRY N/A 11/08/2018   Procedure: ESOPHAGEAL MANOMETRY (EM);  Surgeon: Gatha Mayer, MD;  Location: [facility];  Service: Endoscopy;  Laterality: N/A;    ESOPHAGOGASTRODUODENOSCOPY N/A 05/10/2012   Procedure: ESOPHAGOGASTRODUODENOSCOPY (EGD);  Surgeon: Jerene Bears, MD;  Location: [facility];  Service: Gastroenterology;  Laterality: N/A;   TONSILLECTOMY  1940's   Social History   Social History Narrative   Widowed, retired, 2 daughters husband was a Mining engineer at [employer]   Her daughters and son-in-law's are all philosophy professors to it [employer] [place] and [place], daughter here at [employer] son-in-law at [place] at [facility]   No Etoh/tobacco   Back exercises and walking are patient forms of exercising.     family history includes Arthritis in her mother; Colon cancer in her father; Dementia in her mother; Hypertension in her mother; Stomach cancer in her daughter.   Review of Systems As per HPI  Objective:   Physical Exam BP 112/66   Pulse 73   Ht 5' (1.524 m)   Wt 117 lb (53.1 kg)   BMI 22.85 kg/m

## 2021-09-05 NOTE — Patient Instructions (Signed)
Glad your feeling better.  I appreciate the opportunity to care for you. Silvano Rusk, MD, [facility]

## 2021-09-06 ENCOUNTER — Other Ambulatory Visit: Payer: Self-pay | Admitting: *Deleted

## 2021-09-06 NOTE — Telephone Encounter (Signed)
[facility] refill.  Epic LR: 06/26/2021 No Contract on File. Note added to upcoming appointment.  Pended Rx and sent to Dr. Lyndel Safe for approval.

## 2021-09-09 MED ORDER — LORAZEPAM 0.5 MG PO TABS: 0.5000 mg | ORAL_TABLET | Freq: Two times a day (BID) | ORAL | 2 refills | Status: DC | PRN

## 2021-09-10 ENCOUNTER — Ambulatory Visit: Payer: Medicare Other

## 2021-09-10 DIAGNOSIS — R279 Unspecified lack of coordination: Secondary | ICD-10-CM

## 2021-09-10 DIAGNOSIS — M6281 Muscle weakness (generalized): Secondary | ICD-10-CM | POA: Diagnosis not present

## 2021-09-10 NOTE — Therapy (Signed)
OUTPATIENT PHYSICAL THERAPY TREATMENT NOTE   Patient Name: Savannah Diaz MRN: [id number] DOB:1932/05/22, 88 y.o., female Today's Date: 09/10/2021  PCP: Virgie Dad, MD REFERRING PROVIDER: Gatha Mayer, MD  END OF SESSION:   PT End of Session - 09/10/21 1402     Visit Number 3    Date for PT Re-Evaluation 10/09/21    Authorization Type Medicare    Progress Note Due on Visit 10    PT Start Time 1400    PT Stop Time 1440    PT Time Calculation (min) 40 min    Activity Tolerance Patient tolerated treatment well    Behavior During Therapy [facility] for tasks assessed/performed             Past Medical History:  Diagnosis Date   Anxiety    Arthritis    "qwhere; but it doesn't hurt" (05/12/2012)   Aspiration pneumonia ([place]) 03/21/2016   Cataract    left   Compression fracture of first lumbar vertebra ([place]) 07/2016   superior endplate   Depression    Dysphagia    "have had it for 7 yr; got worse 4 days ago" (05/12/2012)   Dysphagia, pharyngoesophageal phase - suspected 05/10/2012   GERD (gastroesophageal reflux disease)    Hypertension    Insomnia    Lumbago with sciatica, right side    Migraines    "menopause cured them" (05/12/2012)   Osteoporosis    Stage 3 chronic kidney disease ([place])    Uterine prolapse    Past Surgical History:  Procedure Laterality Date   CATARACT EXTRACTION W/ INTRAOCULAR LENS IMPLANT Left ? 2010   ESOPHAGEAL MANOMETRY N/A 11/08/2018   Procedure: ESOPHAGEAL MANOMETRY (EM);  Surgeon: Gatha Mayer, MD;  Location: [facility];  Service: Endoscopy;  Laterality: N/A;   ESOPHAGOGASTRODUODENOSCOPY N/A 05/10/2012   Procedure: ESOPHAGOGASTRODUODENOSCOPY (EGD);  Surgeon: Jerene Bears, MD;  Location: [facility];  Service: Gastroenterology;  Laterality: N/A;   TONSILLECTOMY  1940's   Patient Active Problem List   Diagnosis Date Noted   High risk medication use 08/08/2016   Influenza A 03/22/2016   Fall 03/21/2016   Acute on chronic kidney  failure II-III 03/21/2016   Transient alteration of awareness 05/10/2015   Hypokalemia 05/12/2012   Dysphagia oropharyngeal and pharyngeal 05/10/2012   Hypertension 04/28/2011   Osteoarthritis 04/28/2011   Agoraphobia 04/28/2011    REFERRING DIAG: Fecal smearing  - Primary R15.1  Mixed stress and urge urinary incontinence  N39.46   THERAPY DIAG:  Muscle weakness (generalized)  Unspecified lack of coordination  Rationale for Evaluation and Treatment Rehabilitation  PERTINENT HISTORY: G2P2, chronic nausea, peripheral neuropathy, anxiety/depression  PRECAUTIONS: NA  SUBJECTIVE: Pt states that she has been trying to work on exercises diligently over the last week. She did have episode of incontinence last night on her way to the bathroom in the middle of the night.   PAIN:  Are you having pain? No   SUBJECTIVE:  SUBJECTIVE STATEMENT: Patient states that she has been to see gynecologist and referred back to physical therapy for decreased pelvic floor motor control/strength and specific recommendation for biofeedback.   She is now back on lorazepam which dramatically helped with stomach pain for quite a while. But in the last month she has started not feeling quite as well. She is hesitant to increase her dose to see if that is helpful.    She has not been working on pelvic floor HEP because she feels very tired all the time.    She has started art class. She notices that when she is away from her apartment, she'll notice that she has not been to the bathroom for 3-4 hours.    Fluid intake: Yes: Trying to drink more water - not sure how much       PAIN:  Are you having pain? No     PATIENT GOALS patient wants to feel better; have control over bowels and bladder    BOWEL MOVEMENT Pain with  bowel movement: No Type of bowel movement:Frequency 3-4x a day, most comes out the first several times  and Strain No Fully empty rectum: Yes: but then she has more Leakage: Yes: small amounts that started in the last couple of weeks Pads: Yes: panty liner Fiber supplement: Yes: Metamucil   URINATION Pain with urination: No Fully empty bladder: Yes: - Stream:  WNL Urgency: No Frequency: every 3-4 hours Leakage:  None Pads: Yes: panty liner   INTERCOURSE Pain with intercourse:  Not currently sexually active     PREGNANCY Vaginal deliveries 2 Tearing Yes: - C-section deliveries 0 Currently pregnant No     OBJECTIVE:    COGNITION:            Overall cognitive status: Within functional limits for tasks assessed                          SENSATION:            Light touch: Appears intact            Proprioception: Appears intact     GAIT: Comments: slow shuffling gait pattern                POSTURE: rounded shoulders, forward head, and posterior pelvic tilt      PALPATION:                 External Perineal Exam vulvar redness/irritation; evidence of decrease in estrogen with labial fusion                             Internal Pelvic Floor pelvic floor atrophy   Patient confirms identification and approves PT to assess internal pelvic floor and treatment Yes   PELVIC MMT: 1/5 vaginal strength, endurance 5 seconds, 7 repeat contractions 2/5 puborectalis strength 0/5 external anal sphincter strength       TONE: low   PROLAPSE: Not assessed   TODAY'S TREATMENT 09/10/21 Biofeedback: Quick flicks 5 x 10, 756% and 50% strength Long holds 2 x 10 sec Stair stepper ascending, 3 floors, 5 seconds each, 5x each Self-care: Strict bladder retraining    TREATMENT 09/03/21 Neuromuscular re-education: Biofeedback: Program to work on proprioception and various contractile strength for improved coordination of pelvic floor Self-care: Reviewed purpose of dilator as not  stretching, but providing feedback for improved contraction/proprioception of pelvic floor Gentle cleansing methods after  bowel movements: wet wipes, peri bottle Contacting MD about estrogen cream - we will message as well  TREATMENT 08/14/21 EVAL  Neuromuscular re-education: Pelvic floor contraction training: No emotional/communication barriers or cognitive limitation. Patient is motivated to learn. Patient understands and agrees with treatment goals and plan. PT explains patient will be examined in standing, sitting, and lying down to see how their muscles and joints work. When they are ready, they will be asked to remove their underwear so PT can examine their perineum. The patient is also given the option of providing their own chaperone as one is not provided in our facility. The patient also has the right and is explained the right to defer or refuse any part of the evaluation or treatment including the internal exam. With the patient's consent, PT will use one gloved finger to gently assess the muscles of the pelvic floor, seeing how well it contracts and relaxes and if there is muscle symmetry. After, the patient will get dressed and PT and patient will discuss exam findings and plan of care. PT and patient discuss plan of care, schedule, attendance policy and HEP activities. Pelvic floor contractions training with vaginal and rectal internal feedback Quick flicks 27O Long holds 2 x 10 seconds       PATIENT EDUCATION:  Education details: Initial HEP; biofeedback Person educated: Patient Education method: Explanation, Demonstration, Tactile cues, Verbal cues, and Handouts Education comprehension: verbalized understanding     HOME EXERCISE PROGRAM: [id number]   ASSESSMENT:   CLINICAL IMPRESSION: We returned to biofeedback again this treatment session with more focus on specific quick flicks (82/993% contractile strength) and long holds. She did very well demonstrating improved motor  control over contractions. She did have difficulty with stair stepper exercise and holding each level, indicating that there is more progress to be made with endurance and motor control. We discussed strict bladder retraining and realistic expectation for water intake. We reviewed urge suppression technique and how to use if she has to get up in the middle of the night to go to the bathroom. She will benefit from skilled PT intervention in order to improve control over bowel/bladder and improve QOL.      OBJECTIVE IMPAIRMENTS decreased coordination, decreased endurance, and decreased strength.    ACTIVITY LIMITATIONS continence   PARTICIPATION LIMITATIONS: community activity   PERSONAL FACTORS 1 comorbidity: 2 vaginal deliveries,  peripheral neuropathy  are also affecting patient's functional outcome.    REHAB POTENTIAL: Fair patient has not had success with conservative pelvic floor PT in the recent past   CLINICAL DECISION MAKING: Stable/uncomplicated   EVALUATION COMPLEXITY: Low     GOALS: Goals reviewed with patient? Yes   SHORT TERM GOALS: Target date: 09/11/2021   Pt will be independent with HEP.      LONG TERM GOALS: Target date: 10/09/2021    Pt will be independent with advanced HEP.    Baseline:  Goal status: INITIAL   2.  Pt will demonstrate normal pelvic floor muscle tone and A/ROM, able to achieve 3/5 strength with contractions and 10 sec endurance, in order to provide appropriate lumbopelvic support in functional activities.    Baseline:  Goal status: INITIAL   3.  Pt will report sensation of increased control over bowel/bladder with no episodes incontinence.  Baseline:  Goal status: INITIAL   4.  Pt will be compliant with appropriate bowel/bladder schedule and good habits in order to help improve control/continence.  Baseline:  Goal status: INITIAL  PLAN: PT FREQUENCY: 1x/week   PT DURATION: 8 weeks   PLANNED INTERVENTIONS: Therapeutic exercises,  Therapeutic activity, Neuromuscular re-education, Balance training, Gait training, Patient/Family education, Joint mobilization, Dry Needling, Biofeedback, and Manual therapy   PLAN FOR NEXT SESSION: Biofeedback for improved pelvic floor motor control.    Heather Roberts, PT, DPT07/18/232:43 PM

## 2021-09-11 ENCOUNTER — Encounter: Payer: Self-pay | Admitting: Internal Medicine

## 2021-09-11 ENCOUNTER — Non-Acute Institutional Stay: Payer: Medicare Other | Admitting: Internal Medicine

## 2021-09-11 VITALS — BP 134/72 | HR 64 | Temp 97.8°F | Ht 60.0 in | Wt 115.6 lb

## 2021-09-11 DIAGNOSIS — R159 Full incontinence of feces: Secondary | ICD-10-CM

## 2021-09-11 DIAGNOSIS — R739 Hyperglycemia, unspecified: Secondary | ICD-10-CM

## 2021-09-11 DIAGNOSIS — N1831 Chronic kidney disease, stage 3a: Secondary | ICD-10-CM

## 2021-09-11 DIAGNOSIS — R110 Nausea: Secondary | ICD-10-CM | POA: Diagnosis not present

## 2021-09-11 DIAGNOSIS — M8589 Other specified disorders of bone density and structure, multiple sites: Secondary | ICD-10-CM | POA: Diagnosis not present

## 2021-09-11 DIAGNOSIS — I10 Essential (primary) hypertension: Secondary | ICD-10-CM | POA: Diagnosis not present

## 2021-09-11 DIAGNOSIS — M8580 Other specified disorders of bone density and structure, unspecified site: Secondary | ICD-10-CM | POA: Diagnosis not present

## 2021-09-11 DIAGNOSIS — Z13820 Encounter for screening for osteoporosis: Secondary | ICD-10-CM

## 2021-09-11 DIAGNOSIS — F332 Major depressive disorder, recurrent severe without psychotic features: Secondary | ICD-10-CM

## 2021-09-11 DIAGNOSIS — F419 Anxiety disorder, unspecified: Secondary | ICD-10-CM

## 2021-09-11 DIAGNOSIS — R32 Unspecified urinary incontinence: Secondary | ICD-10-CM

## 2021-09-11 MED ORDER — ESTROGENS CONJUGATED 0.625 MG/GM VA CREA: 1.0000 | TOPICAL_CREAM | Freq: Every day | VAGINAL | 12 refills | Status: DC

## 2021-09-11 MED ORDER — FAMOTIDINE 40 MG PO TABS: 40.0000 mg | ORAL_TABLET | Freq: Two times a day (BID) | ORAL | 3 refills | Status: DC

## 2021-09-11 NOTE — Patient Instructions (Addendum)
Glycerna for low sugar Drink Call for Bone density scan in [facility] [place] [facility]

## 2021-09-15 NOTE — Progress Notes (Signed)
Location: [facility] (12)  Provider:   Code Status:  Goals of Care:     09/11/2021    3:47 PM  Advanced Directives  Does Patient Have a Medical Advance Directive? No  Would patient like information on creating a medical advance directive? No - Patient declined     Chief Complaint  Patient presents with   Medical Management of Chronic Issues    Patient returns to the clinic   Quality Metric Gaps    Shngrix and dexa. Shingrix not documented in [facility] nor [facility].     HPI: Patient is a 88 y.o. female seen today for an acute visit for Follow up of her Anxiety , Depression  and Fatigue  Nausea and abdominal pain EGD in 11/22 Mild inflammation characterized by erythema, friability and granularity was found in the gastric antrum. Manometry in 09/20 Normal in EG junction and no Motility issue CT scan of abdomen in 05/22 and 5/23 negative [place] n05/23 negative  Better since she has been on Ativan Takes it twice a day wants to make sure that we Renew it as needed  Continues to work with therapy for urinary and bowel incontinence Had detailed work-up by GYN and everything is negative Talks to the therapist online every week Continues to get tired and fatigued very easily Poor Appetite  Wt Readings from Last 3 Encounters:  09/11/21 115 lb 9.6 oz (52.4 kg)  09/05/21 117 lb (53.1 kg)  07/24/21 115 lb 4.8 oz (52.3 kg)     Past Medical History:  Diagnosis Date   Anxiety    Arthritis    "qwhere; but it doesn't hurt" (05/12/2012)   Aspiration pneumonia ([place]) 03/21/2016   Cataract    left   Compression fracture of first lumbar vertebra ([place]) 07/2016   superior endplate   Depression    Dysphagia    "have had it for 7 yr; got worse 4 days ago" (05/12/2012)   Dysphagia, pharyngoesophageal phase - suspected 05/10/2012   GERD (gastroesophageal reflux disease)    Hypertension    Insomnia    Lumbago with sciatica, right side    Migraines     "menopause cured them" (05/12/2012)   Osteoporosis    Stage 3 chronic kidney disease ([place])    Uterine prolapse     Past Surgical History:  Procedure Laterality Date   CATARACT EXTRACTION W/ INTRAOCULAR LENS IMPLANT Left ? 2010   ESOPHAGEAL MANOMETRY N/A 11/08/2018   Procedure: ESOPHAGEAL MANOMETRY (EM);  Surgeon: Gatha Mayer, MD;  Location: [facility];  Service: Endoscopy;  Laterality: N/A;   ESOPHAGOGASTRODUODENOSCOPY N/A 05/10/2012   Procedure: ESOPHAGOGASTRODUODENOSCOPY (EGD);  Surgeon: Jerene Bears, MD;  Location: [facility];  Service: Gastroenterology;  Laterality: N/A;   TONSILLECTOMY  1940's    Allergies  Allergen Reactions   Lactose Intolerance (Gi) Other (See Comments)    Gi tract issues   Lactulose Other (See Comments)    Gi tract issues   Sulfa Antibiotics Hives    Childhood allergy     Outpatient Encounter Medications as of 09/11/2021  Medication Sig   amLODipine (NORVASC) 10 MG tablet Take 1 tablet (10 mg total) by mouth daily.   atenolol (TENORMIN) 50 MG tablet Take 1 tablet (50 mg total) by mouth daily.   Cholecalciferol (VITAMIN D3) 25 MCG (1000 UT) CAPS Take by mouth daily.   conjugated estrogens (PREMARIN) vaginal cream Place 1 Applicatorful vaginally daily.   LORazepam (ATIVAN) 0.5 MG  tablet Take 1 tablet (0.5 mg total) by mouth 2 (two) times daily as needed for anxiety.   mirtazapine (REMERON) 30 MG tablet Take 30 mg by mouth at bedtime. Patient states taking 15 mg   sertraline (ZOLOFT) 20 MG/ML concentrated solution Takes 5 ml by mouth once every day   traZODone (DESYREL) 50 MG tablet Take 1 tablet (50 mg total) by mouth at bedtime.   vitamin B-12 (CYANOCOBALAMIN) 1000 MCG tablet Take 1 tablet (1,000 mcg total) by mouth daily.   [DISCONTINUED] famotidine (PEPCID) 40 MG tablet Take 1 tablet (40 mg total) by mouth at bedtime.   famotidine (PEPCID) 40 MG tablet Take 1 tablet (40 mg total) by mouth 2 (two) times daily.   No facility-administered encounter  medications on file as of 09/11/2021.    Review of Systems:  Review of Systems  Constitutional:  Positive for appetite change. Negative for activity change.  HENT: Negative.    Respiratory:  Negative for cough and shortness of breath.   Cardiovascular:  Negative for leg swelling.  Gastrointestinal:  Negative for constipation.  Genitourinary: Negative.   Musculoskeletal:  Negative for arthralgias, gait problem and myalgias.  Skin: Negative.   Neurological:  Negative for dizziness and weakness.  Psychiatric/Behavioral:  Positive for dysphoric mood. Negative for confusion and sleep disturbance. The patient is nervous/anxious.     Health Maintenance  Topic Date Due   Zoster Vaccines- Shingrix (1 of 2) Never done   DEXA SCAN  Never done   INFLUENZA VACCINE  09/24/2021   TETANUS/TDAP  12/19/2027   Pneumonia Vaccine 27+ Years old  Completed   COVID-19 Vaccine  Completed   HPV VACCINES  Aged Out    Physical Exam: Vitals:   09/11/21 1546  BP: 134/72  Pulse: 64  Temp: 97.8 F (36.6 C)  SpO2: 96%  Weight: 115 lb 9.6 oz (52.4 kg)  Height: 5' (1.524 m)   Body mass index is 22.58 kg/m. Physical Exam Vitals reviewed.  Constitutional:      Appearance: Normal appearance.  HENT:     Head: Normocephalic.     Nose: Nose normal.     Mouth/Throat:     Mouth: Mucous membranes are moist.     Pharynx: Oropharynx is clear.  Eyes:     Pupils: Pupils are equal, round, and reactive to light.  Cardiovascular:     Rate and Rhythm: Normal rate and regular rhythm.     Pulses: Normal pulses.     Heart sounds: Normal heart sounds. No murmur heard. Pulmonary:     Effort: Pulmonary effort is normal.     Breath sounds: Normal breath sounds.  Abdominal:     General: Abdomen is flat. Bowel sounds are normal.     Palpations: Abdomen is soft.  Musculoskeletal:        General: No swelling.     Cervical back: Neck supple.  Skin:    General: Skin is warm.  Neurological:     General: No focal  deficit present.     Mental Status: She is alert and oriented to person, place, and time.  Psychiatric:        Mood and Affect: Mood normal.        Thought Content: Thought content normal.     Labs reviewed: Basic Metabolic Panel: Recent Labs    03/27/21 1157 06/24/21 1757 07/03/21 1629  NA 142 141  --   K 3.7 3.5  --   CL 104 106  --   CO2  32 26  --   GLUCOSE 107* 105*  --   BUN 24* 16  --   CREATININE 0.92 1.00  --   CALCIUM 9.5 9.6  --   TSH  --   --  2.050   Liver Function Tests: Recent Labs    03/27/21 1157 06/24/21 1757  AST 14 20  ALT 16 24  ALKPHOS 44 46  BILITOT 0.7 0.9  PROT 6.7 7.1  ALBUMIN 4.1 4.2   No results for input(s): "LIPASE", "AMYLASE" in the last 8760 hours. No results for input(s): "AMMONIA" in the last 8760 hours. CBC: Recent Labs    03/27/21 1157 06/24/21 1757  WBC 8.0 9.3  NEUTROABS 6.0  --   HGB 12.9 14.1  HCT 38.6 43.4  MCV 88.3 91.8  PLT 251.0 277   Lipid Panel: No results for input(s): "CHOL", "HDL", "LDLCALC", "TRIG", "CHOLHDL", "LDLDIRECT" in the last 8760 hours. Lab Results  Component Value Date   HGBA1C 5.8 (H) 04/23/2015    Procedures since last visit: No results found.  Assessment/Plan 1. Hyperglycemia Check A1C  2. Anxiety Doing well on Lorazepam Contract signed today  3. Severe episode of recurrent major depressive disorder, without psychotic features ([place]) On Remeron and Zoloft Talks to therapist q weekly  4. Chronic nausea Resolved Pepcid BID Appetite still poor Weight stable  5. Bowel and bladder incontinence Works with OT  6. Primary hypertension On Norvasc and Tenormin  7. Stage 3a chronic kidney disease ([facility]) Creat stable  8 Other specified disorders of bone density and structure, multiple sites  - DG BONE DENSITY (DXA); Future 9 Insomnia On Trazodone   Labs/tests ordered:  * No order type specified * Next appt:  09/13/2021

## 2021-09-17 ENCOUNTER — Ambulatory Visit: Payer: Medicare Other

## 2021-09-17 DIAGNOSIS — R279 Unspecified lack of coordination: Secondary | ICD-10-CM

## 2021-09-17 DIAGNOSIS — M6281 Muscle weakness (generalized): Secondary | ICD-10-CM

## 2021-09-17 NOTE — Therapy (Signed)
OUTPATIENT PHYSICAL THERAPY TREATMENT NOTE   Patient Name: Savannah Diaz MRN: [id number] DOB:1932-04-06, 88 y.o., female Today's Date: 09/17/2021  PCP: Virgie Dad, MD REFERRING PROVIDER: Gatha Mayer, MD  END OF SESSION:   PT End of Session - 09/17/21 1434     Visit Number 4    Date for PT Re-Evaluation 10/09/21    Authorization Type Medicare    Progress Note Due on Visit 10    PT Start Time 1445    PT Stop Time 1525    PT Time Calculation (min) 40 min    Activity Tolerance Patient tolerated treatment well    Behavior During Therapy [facility] for tasks assessed/performed              Past Medical History:  Diagnosis Date   Anxiety    Arthritis    "qwhere; but it doesn't hurt" (05/12/2012)   Aspiration pneumonia ([place]) 03/21/2016   Cataract    left   Compression fracture of first lumbar vertebra ([place]) 07/2016   superior endplate   Depression    Dysphagia    "have had it for 7 yr; got worse 4 days ago" (05/12/2012)   Dysphagia, pharyngoesophageal phase - suspected 05/10/2012   GERD (gastroesophageal reflux disease)    Hypertension    Insomnia    Lumbago with sciatica, right side    Migraines    "menopause cured them" (05/12/2012)   Osteoporosis    Stage 3 chronic kidney disease ([place])    Uterine prolapse    Past Surgical History:  Procedure Laterality Date   CATARACT EXTRACTION W/ INTRAOCULAR LENS IMPLANT Left ? 2010   ESOPHAGEAL MANOMETRY N/A 11/08/2018   Procedure: ESOPHAGEAL MANOMETRY (EM);  Surgeon: Gatha Mayer, MD;  Location: [facility];  Service: Endoscopy;  Laterality: N/A;   ESOPHAGOGASTRODUODENOSCOPY N/A 05/10/2012   Procedure: ESOPHAGOGASTRODUODENOSCOPY (EGD);  Surgeon: Jerene Bears, MD;  Location: [facility];  Service: Gastroenterology;  Laterality: N/A;   TONSILLECTOMY  1940's   Patient Active Problem List   Diagnosis Date Noted   High risk medication use 08/08/2016   Influenza A 03/22/2016   Fall 03/21/2016   Acute on chronic kidney  failure II-III 03/21/2016   Transient alteration of awareness 05/10/2015   Hypokalemia 05/12/2012   Dysphagia oropharyngeal and pharyngeal 05/10/2012   Hypertension 04/28/2011   Osteoarthritis 04/28/2011   Agoraphobia 04/28/2011    REFERRING DIAG: Fecal smearing  - Primary R15.1  Mixed stress and urge urinary incontinence  N39.46   THERAPY DIAG:  Muscle weakness (generalized)  Unspecified lack of coordination  Rationale for Evaluation and Treatment Rehabilitation  PERTINENT HISTORY: G2P2, chronic nausea, peripheral neuropathy, anxiety/depression  PRECAUTIONS: NA  SUBJECTIVE: Pt states that she has had 3 episodes where she would normally leak and did not. She has been using the urge suppression technique in the morning and has not had any accidents. Pt have   PAIN:  Are you having pain? No   SUBJECTIVE:  SUBJECTIVE STATEMENT: Patient states that she has been to see gynecologist and referred back to physical therapy for decreased pelvic floor motor control/strength and specific recommendation for biofeedback.   She is now back on lorazepam which dramatically helped with stomach pain for quite a while. But in the last month she has started not feeling quite as well. She is hesitant to increase her dose to see if that is helpful.    She has not been working on pelvic floor HEP because she feels very tired all the time.    She has started art class. She notices that when she is away from her apartment, she'll notice that she has not been to the bathroom for 3-4 hours.    Fluid intake: Yes: Trying to drink more water - not sure how much       PAIN:  Are you having pain? No     PATIENT GOALS patient wants to feel better; have control over bowels and bladder    BOWEL MOVEMENT Pain with bowel  movement: No Type of bowel movement:Frequency 3-4x a day, most comes out the first several times  and Strain No Fully empty rectum: Yes: but then she has more Leakage: Yes: small amounts that started in the last couple of weeks Pads: Yes: panty liner Fiber supplement: Yes: Metamucil   URINATION Pain with urination: No Fully empty bladder: Yes: - Stream:  WNL Urgency: No Frequency: every 3-4 hours Leakage:  None Pads: Yes: panty liner   INTERCOURSE Pain with intercourse:  Not currently sexually active     PREGNANCY Vaginal deliveries 2 Tearing Yes: - C-section deliveries 0 Currently pregnant No     OBJECTIVE:    COGNITION:            Overall cognitive status: Within functional limits for tasks assessed                          SENSATION:            Light touch: Appears intact            Proprioception: Appears intact     GAIT: Comments: slow shuffling gait pattern                POSTURE: rounded shoulders, forward head, and posterior pelvic tilt      PALPATION:                 External Perineal Exam vulvar redness/irritation; evidence of decrease in estrogen with labial fusion                             Internal Pelvic Floor pelvic floor atrophy   Patient confirms identification and approves PT to assess internal pelvic floor and treatment Yes   PELVIC MMT: 1/5 vaginal strength, endurance 5 seconds, 7 repeat contractions 2/5 puborectalis strength 0/5 external anal sphincter strength       TONE: low   PROLAPSE: Not assessed   TODAY'S TREATMENT 09/17/21 Neuromuscular re-education: Biofeedback: Quick flicks 5 x 10, 500% and 50% strength Long holds 2 x 10 sec Stair stepper ascending, 3 floors, 5 seconds each, 5x each Self-care: Discussed external use of estrogen cream in addition to internal use - due to uncertain instruction of estrogen cream, pt encouraged to contact MD for clarification; reviewed how to apply  POC, ongoing appointments to see  further progress  TREATMENT 09/10/21 Biofeedback: Quick flicks 5  x 10, 100% and 50% strength Long holds 2 x 10 sec Stair stepper ascending, 3 floors, 5 seconds each, 5x each Self-care: Strict bladder retraining    TREATMENT 09/03/21 Neuromuscular re-education: Biofeedback: Program to work on proprioception and various contractile strength for improved coordination of pelvic floor Self-care: Reviewed purpose of dilator as not stretching, but providing feedback for improved contraction/proprioception of pelvic floor Gentle cleansing methods after bowel movements: wet wipes, peri bottle Contacting MD about estrogen cream - we will message as well       PATIENT EDUCATION:  Education details:Exercise progressions Person educated: Patient Education method: Explanation, Demonstration, Tactile cues, Verbal cues, and Handouts Education comprehension: verbalized understanding     HOME EXERCISE PROGRAM: [id number]   ASSESSMENT:   CLINICAL IMPRESSION: Patient has seen some great progress over the last week with urinary incontinence, not having any leaking in several situations that she would normally. However, she is still having frequency of bowel movements and urgency that is bothering her. We discussed using urge suppression technique to work on cutting out one bowel movement a day at a time. Biofeedback performed again today with large improvements in pelvic floor motor control noted. She was able to perform 47% quick flicks and long holds with notable improvements, but is still having a lot of difficulty with ascending stair stepper. Believe starting estrogen cream will be very helpful and she was instructed in how to apply to external tissues as well. She will benefit from skilled PT intervention in order to improve control over bowel/bladder and improve QOL.      OBJECTIVE IMPAIRMENTS decreased coordination, decreased endurance, and decreased strength.    ACTIVITY LIMITATIONS  continence   PARTICIPATION LIMITATIONS: community activity   PERSONAL FACTORS 1 comorbidity: 2 vaginal deliveries,  peripheral neuropathy  are also affecting patient's functional outcome.    REHAB POTENTIAL: Fair patient has not had success with conservative pelvic floor PT in the recent past   CLINICAL DECISION MAKING: Stable/uncomplicated   EVALUATION COMPLEXITY: Low     GOALS: Goals reviewed with patient? Yes   SHORT TERM GOALS: Target date: 09/11/2021 - updated 09/17/21   Pt will be independent with HEP.   Baseline:  Goal status: IN PROGRESS   LONG TERM GOALS: Target date: 10/09/2021 - 09/17/21   Pt will be independent with advanced HEP.    Baseline:  Goal status: IN PROGRESS   2.  Pt will demonstrate normal pelvic floor muscle tone and A/ROM, able to achieve 3/5 strength with contractions and 10 sec endurance, in order to provide appropriate lumbopelvic support in functional activities.    Baseline:  Goal status: IN PROGRESS   3.  Pt will report sensation of increased control over bowel/bladder with no episodes incontinence.  Baseline: no episodes of urinary incontinence in the last week Goal status: IN PROGRESS   4.  Pt will be compliant with appropriate bowel/bladder schedule and good habits in order to help improve control/continence.  Baseline:  Goal status: IN PROGRESS     PLAN: PT FREQUENCY: 1x/week   PT DURATION: 8 weeks   PLANNED INTERVENTIONS: Therapeutic exercises, Therapeutic activity, Neuromuscular re-education, Balance training, Gait training, Patient/Family education, Joint mobilization, Dry Needling, Biofeedback, and Manual therapy   PLAN FOR NEXT SESSION: Biofeedback for improved pelvic floor motor control.    Heather Roberts, PT, DPT07/25/233:31 PM

## 2021-09-24 ENCOUNTER — Ambulatory Visit: Payer: Medicare Other

## 2021-09-25 ENCOUNTER — Encounter: Payer: Self-pay | Admitting: Physical Therapy

## 2021-09-25 ENCOUNTER — Ambulatory Visit: Payer: Medicare Other | Attending: Obstetrics & Gynecology | Admitting: Physical Therapy

## 2021-09-25 DIAGNOSIS — R279 Unspecified lack of coordination: Secondary | ICD-10-CM | POA: Insufficient documentation

## 2021-09-25 DIAGNOSIS — M6281 Muscle weakness (generalized): Secondary | ICD-10-CM | POA: Insufficient documentation

## 2021-09-25 NOTE — Therapy (Signed)
OUTPATIENT PHYSICAL THERAPY TREATMENT NOTE   Patient Name: Savannah Diaz MRN: [id number] DOB:1933-02-23, 88 y.o., female Today's Date: 09/25/2021  PCP: Virgie Dad, MD REFERRING PROVIDER: Gatha Mayer, MD  END OF SESSION:   PT End of Session - 09/25/21 1624     Visit Number 5    Date for PT Re-Evaluation 11/20/21    Authorization Type Medicare    PT Start Time 1622    PT Stop Time 1720    PT Time Calculation (min) 58 min    Activity Tolerance Patient tolerated treatment well    Behavior During Therapy WFL for tasks assessed/performed               Past Medical History:  Diagnosis Date   Anxiety    Arthritis    "qwhere; but it doesn't hurt" (05/12/2012)   Aspiration pneumonia ([place]) 03/21/2016   Cataract    left   Compression fracture of first lumbar vertebra ([place]) 07/2016   superior endplate   Depression    Dysphagia    "have had it for 7 yr; got worse 4 days ago" (05/12/2012)   Dysphagia, pharyngoesophageal phase - suspected 05/10/2012   GERD (gastroesophageal reflux disease)    Hypertension    Insomnia    Lumbago with sciatica, right side    Migraines    "menopause cured them" (05/12/2012)   Osteoporosis    Stage 3 chronic kidney disease ([place])    Uterine prolapse    Past Surgical History:  Procedure Laterality Date   CATARACT EXTRACTION W/ INTRAOCULAR LENS IMPLANT Left ? 2010   ESOPHAGEAL MANOMETRY N/A 11/08/2018   Procedure: ESOPHAGEAL MANOMETRY (EM);  Surgeon: Gatha Mayer, MD;  Location: [facility];  Service: Endoscopy;  Laterality: N/A;   ESOPHAGOGASTRODUODENOSCOPY N/A 05/10/2012   Procedure: ESOPHAGOGASTRODUODENOSCOPY (EGD);  Surgeon: Jerene Bears, MD;  Location: [facility];  Service: Gastroenterology;  Laterality: N/A;   TONSILLECTOMY  1940's   Patient Active Problem List   Diagnosis Date Noted   High risk medication use 08/08/2016   Influenza A 03/22/2016   Fall 03/21/2016   Acute on chronic kidney failure II-III 03/21/2016    Transient alteration of awareness 05/10/2015   Hypokalemia 05/12/2012   Dysphagia oropharyngeal and pharyngeal 05/10/2012   Hypertension 04/28/2011   Osteoarthritis 04/28/2011   Agoraphobia 04/28/2011    REFERRING DIAG: Fecal smearing  - Primary R15.1  Mixed stress and urge urinary incontinence  N39.46   THERAPY DIAG:  Muscle weakness (generalized) - Plan: PT plan of care cert/re-cert  Unspecified lack of coordination - Plan: PT plan of care cert/re-cert  Rationale for Evaluation and Treatment Rehabilitation  PERTINENT HISTORY: G2P2, chronic nausea, peripheral neuropathy, anxiety/depression  PRECAUTIONS: NA  SUBJECTIVE: Making it more often with bowel movement but still leaking and at the end of the day is becomes very soft.  PAIN:  Are you having pain? No   SUBJECTIVE:  SUBJECTIVE STATEMENT:  Pt states that she has had 3 episodes where she would normally leak and did not. She has been using the urge suppression technique in the morning and has not had any accidents.   Patient states that she has been to see gynecologist and referred back to physical therapy for decreased pelvic floor motor control/strength and specific recommendation for biofeedback.   She is now back on lorazepam which dramatically helped with stomach pain for quite a while. But in the last month she has started not feeling quite as well. She is hesitant to increase her dose to see if that is helpful.    She has not been working on pelvic floor HEP because she feels very tired all the time.    She has started art class. She notices that when she is away from her apartment, she'll notice that she has not been to the bathroom for 3-4 hours.    Fluid intake: Yes: Trying to drink more water - not sure how much       PAIN:   Are you having pain? No     PATIENT GOALS patient wants to feel better; have control over bowels and bladder    BOWEL MOVEMENT Pain with bowel movement: No Type of bowel movement:Frequency 3-4x a day, most comes out the first several times  and Strain No Fully empty rectum: Yes: but then she has more Leakage: Yes: small amounts that started in the last couple of weeks Pads: Yes: panty liner Fiber supplement: Yes: Metamucil   URINATION Pain with urination: No Fully empty bladder: Yes: - Stream:  WNL Urgency: No Frequency: every 3-4 hours Leakage:  None Pads: Yes: panty liner   INTERCOURSE Pain with intercourse:  Not currently sexually active     PREGNANCY Vaginal deliveries 2 Tearing Yes: - C-section deliveries 0 Currently pregnant No     OBJECTIVE:    COGNITION:            Overall cognitive status: Within functional limits for tasks assessed                          SENSATION:            Light touch: Appears intact            Proprioception: Appears intact     GAIT: Comments: slow shuffling gait pattern                POSTURE: rounded shoulders, forward head, and posterior pelvic tilt      PALPATION:                 External Perineal Exam vulvar redness/irritation; evidence of decrease in estrogen with labial fusion                             Internal Pelvic Floor pelvic floor atrophy   Patient confirms identification and approves PT to assess internal pelvic floor and treatment Yes   PELVIC MMT: updated 09/25/21  1/5 vaginal strength, endurance 5 seconds, 7 repeat contractions(from eval) 3/5 puborectalis strength  1/5 external anal sphincter strength       TONE: low   PROLAPSE: Not assessed   TODAY'S TREATMENT 09/25/21 Neuromuscular re-education: Biofeedback: Quick flicks 5 x 10 - educated on taking more time between reps as she was not relaxing all the way Up and down steps on biofeedback with highest being 2m  Resting tone <1  mV  Self-care: Reviewed use of estrogen cream and reviewed how to apply  Re-assess vulva and showing patient areas to look at with mirror to take note if she notices tissue adhesions; everything looks normal and pat has already been checked for lichen sclerosis Reviewed HEP and made changes for increased resting time   TREATMENT 09/17/21 Neuromuscular re-education: Biofeedback: Quick flicks 5 x 10, 527% and 50% strength Long holds 2 x 10 sec Stair stepper ascending, 3 floors, 5 seconds each, 5x each Self-care: Discussed external use of estrogen cream in addition to internal use - due to uncertain instruction of estrogen cream, pt encouraged to contact MD for clarification; reviewed how to apply  POC, ongoing appointments to see further progress  TREATMENT 09/10/21 Biofeedback: Quick flicks 5 x 10, 782% and 50% strength Long holds 2 x 10 sec Stair stepper ascending, 3 floors, 5 seconds each, 5x each Self-care: Strict bladder retraining          PATIENT EDUCATION:  Education details:Exercise progressions Person educated: Patient Education method: Explanation, Demonstration, Tactile cues, Verbal cues, and Handouts Education comprehension: verbalized understanding     HOME EXERCISE PROGRAM: [id number]   ASSESSMENT:   CLINICAL IMPRESSION: Patient has made progress with strength and notes bladder leakage is no longer occurring. Pt's main concern is fecal incontinence which she has ben having a harder time with.  Pt was reassess rectally with consent  Pt has improved strength since the evaluation but is still weak and then is slow to relax after conractions making quick flicks less effective.  Pt was given exercise to focus on slowing down in order to get max contraction each time. She is expected to continue to make progress at this time and will benefit from skilled PT.  She will benefit from skilled PT intervention in order to improve control over bowel/bladder and improve QOL.       OBJECTIVE IMPAIRMENTS decreased coordination, decreased endurance, and decreased strength.    ACTIVITY LIMITATIONS continence   PARTICIPATION LIMITATIONS: community activity   PERSONAL FACTORS 1 comorbidity: 2 vaginal deliveries,  peripheral neuropathy  are also affecting patient's functional outcome.    REHAB POTENTIAL: Fair patient has not had success with conservative pelvic floor PT in the recent past   CLINICAL DECISION MAKING: Stable/uncomplicated   EVALUATION COMPLEXITY: Low     GOALS: Goals reviewed with patient? Yes   SHORT TERM GOALS: Target date: 09/11/2021 - updated 09/17/21   Pt will be independent with HEP.   Baseline:  Goal status: IN PROGRESS   LONG TERM GOALS: Target date: 11/20/2021 - updated 09/25/21    Pt will be independent with advanced HEP.    Baseline:  Goal status: IN PROGRESS   2.  Pt will demonstrate normal pelvic floor muscle tone and A/ROM, able to achieve 3/5 strength with contractions and 10 sec endurance, in order to provide appropriate lumbopelvic support in functional activities.    Baseline: 3/5 but strength diminishes after 2 reps Goal status: IN PROGRESS   3.  Pt will report sensation of increased control over bowel/bladder with no episodes incontinence.  Baseline: no episodes of urinary incontinence  Goal status: IN PROGRESS   4.  Pt will be compliant with appropriate bowel/bladder schedule and good habits in order to help improve control/continence.  Baseline:  Goal status: IN PROGRESS     PLAN: PT FREQUENCY: 1x/week   PT DURATION: 8 weeks   PLANNED INTERVENTIONS: Therapeutic exercises, Therapeutic activity, Neuromuscular re-education, Balance training,  Gait training, Patient/Family education, Joint mobilization, Dry Needling, Biofeedback, and Manual therapy   PLAN FOR NEXT SESSION: Biofeedback for improved pelvic floor motor control; follow up with rest between sets, discuss stim for rectal strength; colon massage and  abdominal lymph stimulation   Gustavus Bryant, PT 09/25/21 5:49 PM

## 2021-09-26 DIAGNOSIS — L988 Other specified disorders of the skin and subcutaneous tissue: Secondary | ICD-10-CM | POA: Diagnosis not present

## 2021-09-26 DIAGNOSIS — L570 Actinic keratosis: Secondary | ICD-10-CM | POA: Diagnosis not present

## 2021-09-26 DIAGNOSIS — L821 Other seborrheic keratosis: Secondary | ICD-10-CM | POA: Diagnosis not present

## 2021-09-30 ENCOUNTER — Ambulatory Visit: Payer: Medicare Other | Admitting: Physical Therapy

## 2021-09-30 NOTE — Therapy (Deleted)
OUTPATIENT PHYSICAL THERAPY TREATMENT NOTE   Patient Name: Savannah Diaz MRN: [id number] DOB:1932-05-06, 88 y.o., female Today's Date: 09/30/2021  PCP: Virgie Dad, MD REFERRING PROVIDER: Gatha Mayer, MD  END OF SESSION:       Past Medical History:  Diagnosis Date   Anxiety    Arthritis    "qwhere; but it doesn't hurt" (05/12/2012)   Aspiration pneumonia ([place]) 03/21/2016   Cataract    left   Compression fracture of first lumbar vertebra ([place]) 07/2016   superior endplate   Depression    Dysphagia    "have had it for 7 yr; got worse 4 days ago" (05/12/2012)   Dysphagia, pharyngoesophageal phase - suspected 05/10/2012   GERD (gastroesophageal reflux disease)    Hypertension    Insomnia    Lumbago with sciatica, right side    Migraines    "menopause cured them" (05/12/2012)   Osteoporosis    Stage 3 chronic kidney disease ([place])    Uterine prolapse    Past Surgical History:  Procedure Laterality Date   CATARACT EXTRACTION W/ INTRAOCULAR LENS IMPLANT Left ? 2010   ESOPHAGEAL MANOMETRY N/A 11/08/2018   Procedure: ESOPHAGEAL MANOMETRY (EM);  Surgeon: Gatha Mayer, MD;  Location: [facility];  Service: Endoscopy;  Laterality: N/A;   ESOPHAGOGASTRODUODENOSCOPY N/A 05/10/2012   Procedure: ESOPHAGOGASTRODUODENOSCOPY (EGD);  Surgeon: Jerene Bears, MD;  Location: [facility];  Service: Gastroenterology;  Laterality: N/A;   TONSILLECTOMY  1940's   Patient Active Problem List   Diagnosis Date Noted   High risk medication use 08/08/2016   Influenza A 03/22/2016   Fall 03/21/2016   Acute on chronic kidney failure II-III 03/21/2016   Transient alteration of awareness 05/10/2015   Hypokalemia 05/12/2012   Dysphagia oropharyngeal and pharyngeal 05/10/2012   Hypertension 04/28/2011   Osteoarthritis 04/28/2011   Agoraphobia 04/28/2011    REFERRING DIAG: Fecal smearing  - Primary R15.1  Mixed stress and urge urinary incontinence  N39.46   THERAPY DIAG:  No diagnosis  found.  Rationale for Evaluation and Treatment Rehabilitation  PERTINENT HISTORY: G2P2, chronic nausea, peripheral neuropathy, anxiety/depression  PRECAUTIONS: NA  SUBJECTIVE: Making it more often with bowel movement but still leaking and at the end of the day is becomes very soft.  PAIN:  Are you having pain? No   SUBJECTIVE:                                                                                                                                                                                            SUBJECTIVE STATEMENT:  Pt states that she has had 3 episodes where she  would normally leak and did not. She has been using the urge suppression technique in the morning and has not had any accidents.   Patient states that she has been to see gynecologist and referred back to physical therapy for decreased pelvic floor motor control/strength and specific recommendation for biofeedback.   She is now back on lorazepam which dramatically helped with stomach pain for quite a while. But in the last month she has started not feeling quite as well. She is hesitant to increase her dose to see if that is helpful.    She has not been working on pelvic floor HEP because she feels very tired all the time.    She has started art class. She notices that when she is away from her apartment, she'll notice that she has not been to the bathroom for 3-4 hours.    Fluid intake: Yes: Trying to drink more water - not sure how much       PAIN:  Are you having pain? No     PATIENT GOALS patient wants to feel better; have control over bowels and bladder    BOWEL MOVEMENT Pain with bowel movement: No Type of bowel movement:Frequency 3-4x a day, most comes out the first several times  and Strain No Fully empty rectum: Yes: but then she has more Leakage: Yes: small amounts that started in the last couple of weeks Pads: Yes: panty liner Fiber supplement: Yes: Metamucil   URINATION Pain with  urination: No Fully empty bladder: Yes: - Stream:  WNL Urgency: No Frequency: every 3-4 hours Leakage:  None Pads: Yes: panty liner   INTERCOURSE Pain with intercourse:  Not currently sexually active     PREGNANCY Vaginal deliveries 2 Tearing Yes: - C-section deliveries 0 Currently pregnant No     OBJECTIVE:    COGNITION:            Overall cognitive status: Within functional limits for tasks assessed                          SENSATION:            Light touch: Appears intact            Proprioception: Appears intact     GAIT: Comments: slow shuffling gait pattern                POSTURE: rounded shoulders, forward head, and posterior pelvic tilt      PALPATION:                 External Perineal Exam vulvar redness/irritation; evidence of decrease in estrogen with labial fusion                             Internal Pelvic Floor pelvic floor atrophy   Patient confirms identification and approves PT to assess internal pelvic floor and treatment Yes   PELVIC MMT: updated 09/25/21  1/5 vaginal strength, endurance 5 seconds, 7 repeat contractions(from eval) 3/5 puborectalis strength  1/5 external anal sphincter strength       TONE: low   PROLAPSE: Not assessed   TODAY'S TREATMENT 09/25/21 Neuromuscular re-education: Biofeedback: Quick flicks 5 x 10 - educated on taking more time between reps as she was not relaxing all the way Up and down steps on biofeedback with highest being 32m Resting tone <1 mV  Self-care: Reviewed use of estrogen cream and reviewed  how to apply  Re-assess vulva and showing patient areas to look at with mirror to take note if she notices tissue adhesions; everything looks normal and pat has already been checked for lichen sclerosis Reviewed HEP and made changes for increased resting time   TREATMENT 09/17/21 Neuromuscular re-education: Biofeedback: Quick flicks 5 x 10, 678% and 50% strength Long holds 2 x 10 sec Stair stepper  ascending, 3 floors, 5 seconds each, 5x each Self-care: Discussed external use of estrogen cream in addition to internal use - due to uncertain instruction of estrogen cream, pt encouraged to contact MD for clarification; reviewed how to apply  POC, ongoing appointments to see further progress  TREATMENT 09/10/21 Biofeedback: Quick flicks 5 x 10, 938% and 50% strength Long holds 2 x 10 sec Stair stepper ascending, 3 floors, 5 seconds each, 5x each Self-care: Strict bladder retraining          PATIENT EDUCATION:  Education details:Exercise progressions Person educated: Patient Education method: Explanation, Demonstration, Tactile cues, Verbal cues, and Handouts Education comprehension: verbalized understanding     HOME EXERCISE PROGRAM: [id number]   ASSESSMENT:   CLINICAL IMPRESSION: Patient has made progress with strength and notes bladder leakage is no longer occurring. Pt's main concern is fecal incontinence which she has ben having a harder time with.  Pt was reassess rectally with consent  Pt has improved strength since the evaluation but is still weak and then is slow to relax after conractions making quick flicks less effective.  Pt was given exercise to focus on slowing down in order to get max contraction each time. She is expected to continue to make progress at this time and will benefit from skilled PT.  She will benefit from skilled PT intervention in order to improve control over bowel/bladder and improve QOL.      OBJECTIVE IMPAIRMENTS decreased coordination, decreased endurance, and decreased strength.    ACTIVITY LIMITATIONS continence   PARTICIPATION LIMITATIONS: community activity   PERSONAL FACTORS 1 comorbidity: 2 vaginal deliveries,  peripheral neuropathy  are also affecting patient's functional outcome.    REHAB POTENTIAL: Fair patient has not had success with conservative pelvic floor PT in the recent past   CLINICAL DECISION MAKING:  Stable/uncomplicated   EVALUATION COMPLEXITY: Low     GOALS: Goals reviewed with patient? Yes   SHORT TERM GOALS: Target date: 09/11/2021 - updated 09/17/21   Pt will be independent with HEP.   Baseline:  Goal status: IN PROGRESS   LONG TERM GOALS: Target date: 11/20/2021 - updated 09/25/21    Pt will be independent with advanced HEP.    Baseline:  Goal status: IN PROGRESS   2.  Pt will demonstrate normal pelvic floor muscle tone and A/ROM, able to achieve 3/5 strength with contractions and 10 sec endurance, in order to provide appropriate lumbopelvic support in functional activities.    Baseline: 3/5 but strength diminishes after 2 reps Goal status: IN PROGRESS   3.  Pt will report sensation of increased control over bowel/bladder with no episodes incontinence.  Baseline: no episodes of urinary incontinence  Goal status: IN PROGRESS   4.  Pt will be compliant with appropriate bowel/bladder schedule and good habits in order to help improve control/continence.  Baseline:  Goal status: IN PROGRESS     PLAN: PT FREQUENCY: 1x/week   PT DURATION: 8 weeks   PLANNED INTERVENTIONS: Therapeutic exercises, Therapeutic activity, Neuromuscular re-education, Balance training, Gait training, Patient/Family education, Joint mobilization, Dry Needling, Biofeedback, and Manual therapy  PLAN FOR NEXT SESSION: Biofeedback for improved pelvic floor motor control; follow up with rest between sets, discuss stim for rectal strength; colon massage and abdominal lymph stimulation   Gustavus Bryant, PT 09/30/21 2:23 PM

## 2021-10-07 ENCOUNTER — Ambulatory Visit: Payer: Medicare Other | Admitting: Physical Therapy

## 2021-10-09 DIAGNOSIS — Z961 Presence of intraocular lens: Secondary | ICD-10-CM | POA: Diagnosis not present

## 2021-10-09 DIAGNOSIS — H538 Other visual disturbances: Secondary | ICD-10-CM | POA: Diagnosis not present

## 2021-10-16 ENCOUNTER — Other Ambulatory Visit: Payer: Self-pay | Admitting: Internal Medicine

## 2021-10-16 NOTE — Telephone Encounter (Signed)
Med list has mirtazapine 30 mg once daily with a second set of instructions stating patient take 15 mg daily  I will send RX to provider to review, approve, or deny.

## 2021-10-20 NOTE — Therapy (Unsigned)
OUTPATIENT PHYSICAL THERAPY TREATMENT NOTE   Patient Name: Savannah Diaz MRN: [id number] DOB:June 06, 1932, 88 y.o., female Today's Date: 10/20/2021  PCP: Virgie Dad, MD REFERRING PROVIDER: Gatha Mayer, MD  END OF SESSION:       Past Medical History:  Diagnosis Date   Anxiety    Arthritis    "qwhere; but it doesn't hurt" (05/12/2012)   Aspiration pneumonia ([place]) 03/21/2016   Cataract    left   Compression fracture of first lumbar vertebra ([place]) 07/2016   superior endplate   Depression    Dysphagia    "have had it for 7 yr; got worse 4 days ago" (05/12/2012)   Dysphagia, pharyngoesophageal phase - suspected 05/10/2012   GERD (gastroesophageal reflux disease)    Hypertension    Insomnia    Lumbago with sciatica, right side    Migraines    "menopause cured them" (05/12/2012)   Osteoporosis    Stage 3 chronic kidney disease ([place])    Uterine prolapse    Past Surgical History:  Procedure Laterality Date   CATARACT EXTRACTION W/ INTRAOCULAR LENS IMPLANT Left ? 2010   ESOPHAGEAL MANOMETRY N/A 11/08/2018   Procedure: ESOPHAGEAL MANOMETRY (EM);  Surgeon: Gatha Mayer, MD;  Location: [facility];  Service: Endoscopy;  Laterality: N/A;   ESOPHAGOGASTRODUODENOSCOPY N/A 05/10/2012   Procedure: ESOPHAGOGASTRODUODENOSCOPY (EGD);  Surgeon: Jerene Bears, MD;  Location: [facility];  Service: Gastroenterology;  Laterality: N/A;   TONSILLECTOMY  1940's   Patient Active Problem List   Diagnosis Date Noted   High risk medication use 08/08/2016   Influenza A 03/22/2016   Fall 03/21/2016   Acute on chronic kidney failure II-III 03/21/2016   Transient alteration of awareness 05/10/2015   Hypokalemia 05/12/2012   Dysphagia oropharyngeal and pharyngeal 05/10/2012   Hypertension 04/28/2011   Osteoarthritis 04/28/2011   Agoraphobia 04/28/2011    REFERRING DIAG: Fecal smearing  - Primary R15.1  Mixed stress and urge urinary incontinence  N39.46   THERAPY DIAG:  No  diagnosis found.  Rationale for Evaluation and Treatment Rehabilitation  PERTINENT HISTORY: G2P2, chronic nausea, peripheral neuropathy, anxiety/depression  PRECAUTIONS: NA  SUBJECTIVE: Making it more often with bowel movement but still leaking and at the end of the day is becomes very soft.  PAIN:  Are you having pain? No   SUBJECTIVE:                                                                                                                                                                                            SUBJECTIVE STATEMENT:  Pt states that she has had 3 episodes where she  would normally leak and did not. She has been using the urge suppression technique in the morning and has not had any accidents.   Patient states that she has been to see gynecologist and referred back to physical therapy for decreased pelvic floor motor control/strength and specific recommendation for biofeedback.   She is now back on lorazepam which dramatically helped with stomach pain for quite a while. But in the last month she has started not feeling quite as well. She is hesitant to increase her dose to see if that is helpful.    She has not been working on pelvic floor HEP because she feels very tired all the time.    She has started art class. She notices that when she is away from her apartment, she'll notice that she has not been to the bathroom for 3-4 hours.    Fluid intake: Yes: Trying to drink more water - not sure how much       PAIN:  Are you having pain? No     PATIENT GOALS patient wants to feel better; have control over bowels and bladder    BOWEL MOVEMENT Pain with bowel movement: No Type of bowel movement:Frequency 3-4x a day, most comes out the first several times  and Strain No Fully empty rectum: Yes: but then she has more Leakage: Yes: small amounts that started in the last couple of weeks Pads: Yes: panty liner Fiber supplement: Yes: Metamucil   URINATION Pain  with urination: No Fully empty bladder: Yes: - Stream:  WNL Urgency: No Frequency: every 3-4 hours Leakage:  None Pads: Yes: panty liner   INTERCOURSE Pain with intercourse:  Not currently sexually active     PREGNANCY Vaginal deliveries 2 Tearing Yes: - C-section deliveries 0 Currently pregnant No     OBJECTIVE:    COGNITION:            Overall cognitive status: Within functional limits for tasks assessed                          SENSATION:            Light touch: Appears intact            Proprioception: Appears intact     GAIT: Comments: slow shuffling gait pattern                POSTURE: rounded shoulders, forward head, and posterior pelvic tilt      PALPATION:                 External Perineal Exam vulvar redness/irritation; evidence of decrease in estrogen with labial fusion                             Internal Pelvic Floor pelvic floor atrophy   Patient confirms identification and approves PT to assess internal pelvic floor and treatment Yes   PELVIC MMT: updated 09/25/21  1/5 vaginal strength, endurance 5 seconds, 7 repeat contractions(from eval) 3/5 puborectalis strength  1/5 external anal sphincter strength       TONE: low   PROLAPSE: Not assessed   TODAY'S TREATMENT 09/25/21 Neuromuscular re-education: Biofeedback: Quick flicks 5 x 10 - educated on taking more time between reps as she was not relaxing all the way Up and down steps on biofeedback with highest being 23m Resting tone <1 mV  Self-care: Reviewed use of estrogen cream and reviewed  how to apply  Re-assess vulva and showing patient areas to look at with mirror to take note if she notices tissue adhesions; everything looks normal and pat has already been checked for lichen sclerosis Reviewed HEP and made changes for increased resting time   TREATMENT 09/17/21 Neuromuscular re-education: Biofeedback: Quick flicks 5 x 10, 427% and 50% strength Long holds 2 x 10 sec Stair stepper  ascending, 3 floors, 5 seconds each, 5x each Self-care: Discussed external use of estrogen cream in addition to internal use - due to uncertain instruction of estrogen cream, pt encouraged to contact MD for clarification; reviewed how to apply  POC, ongoing appointments to see further progress  TREATMENT 09/10/21 Biofeedback: Quick flicks 5 x 10, 062% and 50% strength Long holds 2 x 10 sec Stair stepper ascending, 3 floors, 5 seconds each, 5x each Self-care: Strict bladder retraining          PATIENT EDUCATION:  Education details:Exercise progressions Person educated: Patient Education method: Explanation, Demonstration, Tactile cues, Verbal cues, and Handouts Education comprehension: verbalized understanding     HOME EXERCISE PROGRAM: [id number]   ASSESSMENT:   CLINICAL IMPRESSION: Patient has made progress with strength and notes bladder leakage is no longer occurring. Pt's main concern is fecal incontinence which she has ben having a harder time with.  Pt was reassess rectally with consent  Pt has improved strength since the evaluation but is still weak and then is slow to relax after conractions making quick flicks less effective.  Pt was given exercise to focus on slowing down in order to get max contraction each time. She is expected to continue to make progress at this time and will benefit from skilled PT.  She will benefit from skilled PT intervention in order to improve control over bowel/bladder and improve QOL.      OBJECTIVE IMPAIRMENTS decreased coordination, decreased endurance, and decreased strength.    ACTIVITY LIMITATIONS continence   PARTICIPATION LIMITATIONS: community activity   PERSONAL FACTORS 1 comorbidity: 2 vaginal deliveries,  peripheral neuropathy  are also affecting patient's functional outcome.    REHAB POTENTIAL: Fair patient has not had success with conservative pelvic floor PT in the recent past   CLINICAL DECISION MAKING:  Stable/uncomplicated   EVALUATION COMPLEXITY: Low     GOALS: Goals reviewed with patient? Yes   SHORT TERM GOALS: Target date: 09/11/2021 - updated 09/17/21   Pt will be independent with HEP.   Baseline:  Goal status: IN PROGRESS   LONG TERM GOALS: Target date: 11/20/2021 - updated 09/25/21    Pt will be independent with advanced HEP.    Baseline:  Goal status: IN PROGRESS   2.  Pt will demonstrate normal pelvic floor muscle tone and A/ROM, able to achieve 3/5 strength with contractions and 10 sec endurance, in order to provide appropriate lumbopelvic support in functional activities.    Baseline: 3/5 but strength diminishes after 2 reps Goal status: IN PROGRESS   3.  Pt will report sensation of increased control over bowel/bladder with no episodes incontinence.  Baseline: no episodes of urinary incontinence  Goal status: IN PROGRESS   4.  Pt will be compliant with appropriate bowel/bladder schedule and good habits in order to help improve control/continence.  Baseline:  Goal status: IN PROGRESS     PLAN: PT FREQUENCY: 1x/week   PT DURATION: 8 weeks   PLANNED INTERVENTIONS: Therapeutic exercises, Therapeutic activity, Neuromuscular re-education, Balance training, Gait training, Patient/Family education, Joint mobilization, Dry Needling, Biofeedback, and Manual therapy  PLAN FOR NEXT SESSION: Biofeedback for improved pelvic floor motor control; follow up with rest between sets, discuss stim for rectal strength; colon massage and abdominal lymph stimulation   Gustavus Bryant, PT 10/20/21 4:28 PM

## 2021-10-21 ENCOUNTER — Encounter: Payer: Self-pay | Admitting: Physical Therapy

## 2021-10-21 ENCOUNTER — Ambulatory Visit: Payer: Medicare Other | Admitting: Physical Therapy

## 2021-10-21 DIAGNOSIS — R279 Unspecified lack of coordination: Secondary | ICD-10-CM | POA: Diagnosis not present

## 2021-10-21 DIAGNOSIS — M6281 Muscle weakness (generalized): Secondary | ICD-10-CM

## 2021-10-24 DIAGNOSIS — I10 Essential (primary) hypertension: Secondary | ICD-10-CM | POA: Diagnosis not present

## 2021-10-24 DIAGNOSIS — R739 Hyperglycemia, unspecified: Secondary | ICD-10-CM

## 2021-10-24 DIAGNOSIS — N1831 Chronic kidney disease, stage 3a: Secondary | ICD-10-CM | POA: Diagnosis not present

## 2021-10-25 ENCOUNTER — Other Ambulatory Visit: Payer: Self-pay | Admitting: *Deleted

## 2021-10-25 LAB — CBC WITH DIFFERENTIAL/PLATELET
Absolute Monocytes: 517 cells/uL (ref 200–950)
Basophils Absolute: 49 cells/uL (ref 0–200)
Basophils Relative: 0.6 %
Eosinophils Absolute: 418 cells/uL (ref 15–500)
Eosinophils Relative: 5.1 %
HCT: 39.7 % (ref 35.0–45.0)
Hemoglobin: 13.1 g/dL (ref 11.7–15.5)
Lymphs Abs: 1123 cells/uL (ref 850–3900)
MCH: 30.3 pg (ref 27.0–33.0)
MCHC: 33 g/dL (ref 32.0–36.0)
MCV: 91.9 fL (ref 80.0–100.0)
MPV: 9.7 fL (ref 7.5–12.5)
Monocytes Relative: 6.3 %
Neutro Abs: 6093 cells/uL (ref 1500–7800)
Neutrophils Relative %: 74.3 %
Platelets: 248 10*3/uL (ref 140–400)
RBC: 4.32 10*6/uL (ref 3.80–5.10)
RDW: 12.5 % (ref 11.0–15.0)
Total Lymphocyte: 13.7 %
WBC: 8.2 10*3/uL (ref 3.8–10.8)

## 2021-10-25 LAB — COMPLETE METABOLIC PANEL WITH GFR
AG Ratio: 1.7 (calc) (ref 1.0–2.5)
ALT: 17 U/L (ref 6–29)
AST: 13 U/L (ref 10–35)
Albumin: 3.9 g/dL (ref 3.6–5.1)
Alkaline phosphatase (APISO): 44 U/L (ref 37–153)
BUN: 22 mg/dL (ref 7–25)
CO2: 31 mmol/L (ref 20–32)
Calcium: 9.5 mg/dL (ref 8.6–10.4)
Chloride: 104 mmol/L (ref 98–110)
Creat: 0.95 mg/dL (ref 0.60–0.95)
Globulin: 2.3 g/dL (calc) (ref 1.9–3.7)
Glucose, Bld: 92 mg/dL (ref 65–99)
Potassium: 3.8 mmol/L (ref 3.5–5.3)
Sodium: 142 mmol/L (ref 135–146)
Total Bilirubin: 1.2 mg/dL (ref 0.2–1.2)
Total Protein: 6.2 g/dL (ref 6.1–8.1)
eGFR: 58 mL/min/{1.73_m2} — ABNORMAL LOW (ref >=60)

## 2021-10-25 LAB — HEMOGLOBIN A1C
Hgb A1c MFr Bld: 5.5 % of total Hgb (ref <5.7)
Mean Plasma Glucose: 111 mg/dL
eAG (mmol/L): 6.2 mmol/L

## 2021-10-25 LAB — LIPID PANEL
Cholesterol: 232 mg/dL — ABNORMAL HIGH (ref <200)
HDL: 46 mg/dL — ABNORMAL LOW (ref >=50)
LDL Cholesterol (Calc): 162 mg/dL (calc) — ABNORMAL HIGH
Non-HDL Cholesterol (Calc): 186 mg/dL (calc) — ABNORMAL HIGH (ref <130)
Total CHOL/HDL Ratio: 5 (calc) — ABNORMAL HIGH (ref <5.0)
Triglycerides: 118 mg/dL (ref <150)

## 2021-10-29 ENCOUNTER — Other Ambulatory Visit: Payer: Self-pay | Admitting: Internal Medicine

## 2021-10-30 ENCOUNTER — Non-Acute Institutional Stay: Payer: Medicare Other | Admitting: Internal Medicine

## 2021-10-30 ENCOUNTER — Encounter: Payer: Medicare Other | Admitting: Internal Medicine

## 2021-10-30 ENCOUNTER — Encounter: Payer: Self-pay | Admitting: Internal Medicine

## 2021-10-30 VITALS — BP 110/78 | HR 79 | Temp 96.6°F | Ht 60.0 in | Wt 115.8 lb

## 2021-10-30 DIAGNOSIS — R110 Nausea: Secondary | ICD-10-CM

## 2021-10-30 DIAGNOSIS — M25512 Pain in left shoulder: Secondary | ICD-10-CM

## 2021-10-30 DIAGNOSIS — R159 Full incontinence of feces: Secondary | ICD-10-CM | POA: Diagnosis not present

## 2021-10-30 DIAGNOSIS — Z13820 Encounter for screening for osteoporosis: Secondary | ICD-10-CM

## 2021-10-30 DIAGNOSIS — F332 Major depressive disorder, recurrent severe without psychotic features: Secondary | ICD-10-CM

## 2021-10-30 DIAGNOSIS — I10 Essential (primary) hypertension: Secondary | ICD-10-CM

## 2021-10-30 DIAGNOSIS — R32 Unspecified urinary incontinence: Secondary | ICD-10-CM | POA: Diagnosis not present

## 2021-10-30 DIAGNOSIS — E7849 Other hyperlipidemia: Secondary | ICD-10-CM | POA: Diagnosis not present

## 2021-10-30 DIAGNOSIS — F419 Anxiety disorder, unspecified: Secondary | ICD-10-CM

## 2021-10-30 DIAGNOSIS — R61 Generalized hyperhidrosis: Secondary | ICD-10-CM | POA: Diagnosis not present

## 2021-10-31 ENCOUNTER — Ambulatory Visit: Payer: Medicare Other | Attending: Obstetrics & Gynecology | Admitting: Physical Therapy

## 2021-10-31 ENCOUNTER — Encounter: Payer: Self-pay | Admitting: Physical Therapy

## 2021-10-31 DIAGNOSIS — M6281 Muscle weakness (generalized): Secondary | ICD-10-CM | POA: Diagnosis not present

## 2021-10-31 DIAGNOSIS — R279 Unspecified lack of coordination: Secondary | ICD-10-CM | POA: Diagnosis not present

## 2021-10-31 NOTE — Therapy (Signed)
OUTPATIENT PHYSICAL THERAPY TREATMENT NOTE   Patient Name: Savannah Diaz MRN: [id number] DOB:[birth year], 88 y.o., female Today's Date: 10/31/2021  PCP: Virgie Dad, MD REFERRING PROVIDER: Gatha Mayer, MD  END OF SESSION:   PT End of Session - 10/31/21 1354     Visit Number 7    Date for PT Re-Evaluation 11/20/21    Authorization Type Medicare    PT Start Time 1400    PT Stop Time 1440    PT Time Calculation (min) 40 min    Activity Tolerance Patient tolerated treatment well    Behavior During Therapy [facility] for tasks assessed/performed                Past Medical History:  Diagnosis Date   Anxiety    Arthritis    "qwhere; but it doesn't hurt" (05/12/2012)   Aspiration pneumonia ([place]) 03/21/2016   Cataract    left   Compression fracture of first lumbar vertebra ([place]) 07/2016   superior endplate   Depression    Dysphagia    "have had it for 7 yr; got worse 4 days ago" (05/12/2012)   Dysphagia, pharyngoesophageal phase - suspected 05/10/2012   GERD (gastroesophageal reflux disease)    Hypertension    Insomnia    Lumbago with sciatica, right side    Migraines    "menopause cured them" (05/12/2012)   Osteoporosis    Stage 3 chronic kidney disease ([place])    Uterine prolapse    Past Surgical History:  Procedure Laterality Date   CATARACT EXTRACTION W/ INTRAOCULAR LENS IMPLANT Left ? 2010   ESOPHAGEAL MANOMETRY N/A 11/08/2018   Procedure: ESOPHAGEAL MANOMETRY (EM);  Surgeon: Gatha Mayer, MD;  Location: [facility];  Service: Endoscopy;  Laterality: N/A;   ESOPHAGOGASTRODUODENOSCOPY N/A 05/10/2012   Procedure: ESOPHAGOGASTRODUODENOSCOPY (EGD);  Surgeon: Jerene Bears, MD;  Location: [facility];  Service: Gastroenterology;  Laterality: N/A;   TONSILLECTOMY  1940's   Patient Active Problem List   Diagnosis Date Noted   High risk medication use 08/08/2016   Influenza A 03/22/2016   Fall 03/21/2016   Acute on chronic kidney failure II-III 03/21/2016    Transient alteration of awareness 05/10/2015   Hypokalemia 05/12/2012   Dysphagia oropharyngeal and pharyngeal 05/10/2012   Hypertension 04/28/2011   Osteoarthritis 04/28/2011   Agoraphobia 04/28/2011    REFERRING DIAG: Fecal smearing  - Primary R15.1  Mixed stress and urge urinary incontinence  N39.46   THERAPY DIAG:  Muscle weakness (generalized)  Unspecified lack of coordination  Rationale for Evaluation and Treatment Rehabilitation  PERTINENT HISTORY: G2P2, chronic nausea, peripheral neuropathy, anxiety/depression  PRECAUTIONS: NA  SUBJECTIVE: I have irritation around the tag on the anus.  It comes very quickly when I have to poop.    PAIN:  Are you having pain? No   SUBJECTIVE:  SUBJECTIVE STATEMENT:  Pt states that she has had 3 episodes where she would normally leak and did not. She has been using the urge suppression technique in the morning and has not had any accidents.   Patient states that she has been to see gynecologist and referred back to physical therapy for decreased pelvic floor motor control/strength and specific recommendation for biofeedback.   She is now back on lorazepam which dramatically helped with stomach pain for quite a while. But in the last month she has started not feeling quite as well. She is hesitant to increase her dose to see if that is helpful.    She has not been working on pelvic floor HEP because she feels very tired all the time.    She has started art class. She notices that when she is away from her apartment, she'll notice that she has not been to the bathroom for 3-4 hours.    Fluid intake: Yes: Trying to drink more water - not sure how much       PAIN:  Are you having pain? No     PATIENT GOALS patient wants to feel better; have control  over bowels and bladder    BOWEL MOVEMENT Pain with bowel movement: No Type of bowel movement:Frequency 3-4x a day, most comes out the first several times  and Strain No Fully empty rectum: Yes: but then she has more Leakage: Yes: small amounts that started in the last couple of weeks Pads: Yes: panty liner Fiber supplement: Yes: Metamucil   URINATION Pain with urination: No Fully empty bladder: Yes: - Stream:  WNL Urgency: No Frequency: every 3-4 hours Leakage:  None Pads: Yes: panty liner   INTERCOURSE Pain with intercourse:  Not currently sexually active     PREGNANCY Vaginal deliveries 2 Tearing Yes: - C-section deliveries 0 Currently pregnant No     OBJECTIVE:    COGNITION:            Overall cognitive status: Within functional limits for tasks assessed                          SENSATION:            Light touch: Appears intact            Proprioception: Appears intact     GAIT: Comments: slow shuffling gait pattern                POSTURE: rounded shoulders, forward head, and posterior pelvic tilt      PALPATION:                 External Perineal Exam vulvar redness/irritation; evidence of decrease in estrogen with labial fusion                             Internal Pelvic Floor pelvic floor atrophy   Patient confirms identification and approves PT to assess internal pelvic floor and treatment Yes   PELVIC MMT: updated 10/21/21  2/5 vaginal strength, endurance 8 seconds, 7 repeat contractions(from eval) 3/5 puborectalis strength  2/5 external anal sphincter strength       TONE: low   PROLAPSE: Not assessed   TODAY'S TREATMENT 10/31/21  Neuromuscular re-education: Biofeedback: Lying supine - sensors placed around anus Supine kegel 6-12 ratio work to [employer] with kegel Reverse calm with kegel Self care: Reviewed HEP for correct hold/rest times and  correct number of reps/sets   TREATMENT 10/21/21  Neuromuscular  re-education: Biofeedback: Supine with hip ER using block Resting tone 1 mV Side lying kegel    TODAY'S TREATMENT 09/25/21  Neuromuscular re-education: Biofeedback: Quick flicks 5 x 10 - educated on taking more time between reps as she was not relaxing all the way Up and down steps on biofeedback with highest being 66m Resting tone <1 mV  Self-care: Reviewed use of estrogen cream and reviewed how to apply  Re-assess vulva and showing patient areas to look at with mirror to take note if she notices tissue adhesions; everything looks normal and pat has already been checked for lichen sclerosis Reviewed HEP and made changes for increased resting time   TREATMENT 09/17/21 Neuromuscular re-education: Biofeedback: Quick flicks 5 x 10, 1160%and 50% strength Long holds 2 x 10 sec Stair stepper ascending, 3 floors, 5 seconds each, 5x each Self-care: Discussed external use of estrogen cream in addition to internal use - due to uncertain instruction of estrogen cream, pt encouraged to contact MD for clarification; reviewed how to apply  POC, ongoing appointments to see further progress       PATIENT EDUCATION:  Education details:Exercise progressions Person educated: Patient Education method: Explanation, Demonstration, Tactile cues, Verbal cues, and Handouts Education comprehension: verbalized understanding     HOME EXERCISE PROGRAM: Access Code: [email or web address]: [email or web address] Date: 10/31/2021 Prepared by: JJari Favre Exercises - Supine Pelvic Floor Contraction  - 3 x daily - 7 x weekly - 1 sets - 10 reps - 2 hold - Quick Flick Pelvic Floor Contractions in [place]  - 3 x daily - 7 x weekly - 1 sets - 10 reps - Sidelying Reverse Clamshell  - 1 x daily - 7 x weekly - 1 sets - 10 reps - Clamshell  - 1 x daily - 7 x weekly - 1 sets - 10 reps   ASSESSMENT:   CLINICAL IMPRESSION: Pt did well with biofeedback.  She had a hard time holding the  contraction.  More focus on the anal sphincter muscles. Exercises were reviewed for her understanding of HEP and to make sure everything written in a way that made sense.  Pt will benefit from skilled PT to continue to address weakness and endurance of the pelvic floor for reduced episodes of incontinence      OBJECTIVE IMPAIRMENTS decreased coordination, decreased endurance, and decreased strength.    ACTIVITY LIMITATIONS continence   PARTICIPATION LIMITATIONS: community activity   PERSONAL FACTORS 1 comorbidity: 2 vaginal deliveries,  peripheral neuropathy  are also affecting patient's functional outcome.    REHAB POTENTIAL: Fair patient has not had success with conservative pelvic floor PT in the recent past   CLINICAL DECISION MAKING: Stable/uncomplicated   EVALUATION COMPLEXITY: Low     GOALS: Goals reviewed with patient? Yes   SHORT TERM GOALS: Target date: 09/11/2021 - updated 09/17/21   Pt will be independent with HEP.   Baseline:  Goal status: IN PROGRESS   LONG TERM GOALS: Target date: 11/20/2021 - updated 09/25/21    Pt will be independent with advanced HEP.    Baseline:  Goal status: IN PROGRESS   2.  Pt will demonstrate normal pelvic floor muscle tone and A/ROM, able to achieve 3/5 strength with contractions and 10 sec endurance, in order to provide appropriate lumbopelvic support in functional activities.    Baseline: 3/5 but strength diminishes after 2 reps Goal status: IN PROGRESS   3.  Pt will report sensation  of increased control over bowel/bladder with no episodes incontinence.  Baseline: no episodes of urinary incontinence  Goal status: IN PROGRESS   4.  Pt will be compliant with appropriate bowel/bladder schedule and good habits in order to help improve control/continence.  Baseline:  Goal status: IN PROGRESS     PLAN: PT FREQUENCY: 1x/week   PT DURATION: 8 weeks   PLANNED INTERVENTIONS: Therapeutic exercises, Therapeutic activity, Neuromuscular  re-education, Balance training, Gait training, Patient/Family education, Joint mobilization, Dry Needling, Biofeedback, and Manual therapy   PLAN FOR NEXT SESSION: Biofeedback with sensors on posterior pelvic floor - 6-12 wor-rest with max 25 mV - re-assess goals  [employer], PT 10/31/21 2:09 PM

## 2021-11-01 NOTE — Progress Notes (Signed)
Location:  [facility] (12)  Provider:   Code Status:  Goals of Care:     09/11/2021    3:47 PM  Advanced Directives  Does Patient Have a Medical Advance Directive? No  Would patient like information on creating a medical advance directive? No - Patient declined     Chief Complaint  Patient presents with   Medical Management of Chronic Issues    Medical Management of Chronic Issues. 32Month follow up. Discuss need for zoster and covid vaccine or postpone if patient refuses. NCIR Verified.     HPI: Patient is a 88 y.o. female seen today for medical management of chronic diseases.   Comes for Follow up  Nausea and abdominal pain EGD in 11/22 Mild inflammation characterized by erythema, friability and granularity was found in the gastric antrum. Manometry in 09/20 Normal in EG junction and no Motility issue CT scan of abdomen in 05/22 and 5/23 negative [place] n05/23 negative   Better since she has been on Ativan  Continues to work with therapy for urinary and bowel incontinence Had detailed work-up by GYN and everything is negative Talks to the therapist online every week Continues to get tired and fatigued very easily Poor Appetite Wt Readings from Last 3 Encounters:  10/30/21 115 lb 12.8 oz (52.5 kg)  09/11/21 115 lb 9.6 oz (52.4 kg)  09/05/21 117 lb (53.1 kg)    Night sweating No Fever     Also  she had Pain and discomfort in her Left shoulder Was lifting something and since then not able to move her Arm above her Shoulder No Falls  Past Medical History:  Diagnosis Date   Anxiety    Arthritis    "qwhere; but it doesn't hurt" (05/12/2012)   Aspiration pneumonia ([place]) 03/21/2016   Cataract    left   Compression fracture of first lumbar vertebra ([place]) 07/2016   superior endplate   Depression    Dysphagia    "have had it for 7 yr; got worse 4 days ago" (05/12/2012)   Dysphagia, pharyngoesophageal phase - suspected 05/10/2012    GERD (gastroesophageal reflux disease)    Hypertension    Insomnia    Lumbago with sciatica, right side    Migraines    "menopause cured them" (05/12/2012)   Osteoporosis    Stage 3 chronic kidney disease ([place])    Uterine prolapse     Past Surgical History:  Procedure Laterality Date   CATARACT EXTRACTION W/ INTRAOCULAR LENS IMPLANT Left ? 2010   ESOPHAGEAL MANOMETRY N/A 11/08/2018   Procedure: ESOPHAGEAL MANOMETRY (EM);  Surgeon: Gatha Mayer, MD;  Location: [facility];  Service: Endoscopy;  Laterality: N/A;   ESOPHAGOGASTRODUODENOSCOPY N/A 05/10/2012   Procedure: ESOPHAGOGASTRODUODENOSCOPY (EGD);  Surgeon: Jerene Bears, MD;  Location: [facility];  Service: Gastroenterology;  Laterality: N/A;   TONSILLECTOMY  1940's    Allergies  Allergen Reactions   Lactose Intolerance (Gi) Other (See Comments)    Gi tract issues   Lactulose Other (See Comments)    Gi tract issues   Sulfa Antibiotics Hives    Childhood allergy     Outpatient Encounter Medications as of 10/30/2021  Medication Sig   amLODipine (NORVASC) 10 MG tablet Take 1 tablet (10 mg total) by mouth daily.   atenolol (TENORMIN) 50 MG tablet Take 1 tablet (50 mg total) by mouth daily.   Cholecalciferol (VITAMIN D3) 25 MCG (1000 UT) CAPS Take by mouth daily.  conjugated estrogens (PREMARIN) vaginal cream Place 1 Applicatorful vaginally daily.   famotidine (PEPCID) 40 MG tablet Take 1 tablet (40 mg total) by mouth 2 (two) times daily.   LORazepam (ATIVAN) 0.5 MG tablet Take 1 tablet (0.5 mg total) by mouth 2 (two) times daily as needed for anxiety.   mirtazapine (REMERON) 15 MG tablet take ONE tab AT bedtime   mirtazapine (REMERON) 30 MG tablet Take 30 mg by mouth at bedtime. Patient states taking 15 mg   traZODone (DESYREL) 50 MG tablet Take 1 tablet (50 mg total) by mouth at bedtime.   vitamin B-12 (CYANOCOBALAMIN) 1000 MCG tablet Take 1 tablet (1,000 mcg total) by mouth daily.   [DISCONTINUED] sertraline (ZOLOFT)  20 MG/ML concentrated solution Takes 5 ml by mouth once every day   No facility-administered encounter medications on file as of 10/30/2021.    Review of Systems:  Review of Systems  Constitutional:  Negative for activity change and appetite change.  HENT: Negative.    Respiratory:  Negative for cough and shortness of breath.   Cardiovascular:  Negative for leg swelling.  Gastrointestinal:  Negative for constipation.  Genitourinary: Negative.   Musculoskeletal:  Positive for arthralgias. Negative for gait problem and myalgias.  Skin: Negative.   Neurological:  Negative for dizziness and weakness.  Psychiatric/Behavioral:  Positive for sleep disturbance. Negative for confusion and dysphoric mood. The patient is nervous/anxious.     Health Maintenance  Topic Date Due   Zoster Vaccines- Shingrix (1 of 2) Never done   DEXA SCAN  Never done   COVID-19 Vaccine (7 - Moderna risk series) 10/02/2021   INFLUENZA VACCINE  01/23/2022 (Originally 09/24/2021)   TETANUS/TDAP  12/19/2027   Pneumonia Vaccine 43+ Years old  Completed   HPV VACCINES  Aged Out    Physical Exam: Vitals:   10/30/21 1447  BP: 110/78  Pulse: 79  Temp: (!) 96.6 F (35.9 C)  SpO2: 97%  Weight: 115 lb 12.8 oz (52.5 kg)  Height: 5' (1.524 m)   Body mass index is 22.62 kg/m. Physical Exam Vitals reviewed.  Constitutional:      Appearance: Normal appearance.  HENT:     Head: Normocephalic.     Nose: Nose normal.     Mouth/Throat:     Mouth: Mucous membranes are moist.     Pharynx: Oropharynx is clear.  Eyes:     Pupils: Pupils are equal, round, and reactive to light.  Cardiovascular:     Rate and Rhythm: Normal rate and regular rhythm.     Pulses: Normal pulses.     Heart sounds: Normal heart sounds. No murmur heard. Pulmonary:     Effort: Pulmonary effort is normal.     Breath sounds: Normal breath sounds.  Abdominal:     General: Abdomen is flat. Bowel sounds are normal.     Palpations: Abdomen is  soft.  Musculoskeletal:        General: No swelling.     Cervical back: Neck supple.     Comments: Right shoulder Normal [place] Left shoulder stiff  No Active or passive movement above the Neck line  Skin:    General: Skin is warm.  Neurological:     General: No focal deficit present.     Mental Status: She is alert and oriented to person, place, and time.  Psychiatric:        Mood and Affect: Mood normal.        Thought Content: Thought content normal.  Labs reviewed: Basic Metabolic Panel: Recent Labs    03/27/21 1157 06/24/21 1757 07/03/21 1629 10/24/21 0820  NA 142 141  --  142  K 3.7 3.5  --  3.8  CL 104 106  --  104  CO2 32 26  --  31  GLUCOSE 107* 105*  --  92  BUN 24* 16  --  22  CREATININE 0.92 1.00  --  0.95  CALCIUM 9.5 9.6  --  9.5  TSH  --   --  2.050  --    Liver Function Tests: Recent Labs    03/27/21 1157 06/24/21 1757 10/24/21 0820  AST '14 20 13  '$ ALT '16 24 17  '$ ALKPHOS 44 46  --   BILITOT 0.7 0.9 1.2  PROT 6.7 7.1 6.2  ALBUMIN 4.1 4.2  --    No results for input(s): "LIPASE", "AMYLASE" in the last 8760 hours. No results for input(s): "AMMONIA" in the last 8760 hours. CBC: Recent Labs    03/27/21 1157 06/24/21 1757 10/24/21 0820  WBC 8.0 9.3 8.2  NEUTROABS 6.0  --  6,093  HGB 12.9 14.1 13.1  HCT 38.6 43.4 39.7  MCV 88.3 91.8 91.9  PLT 251.0 277 248   Lipid Panel: Recent Labs    10/24/21 0820  CHOL 232*  HDL 46*  LDLCALC 162*  TRIG 118  CHOLHDL 5.0*   Lab Results  Component Value Date   HGBA1C 5.5 10/24/2021    Procedures since last visit: No results found.  Assessment/Plan 1. Acute pain of left shoulder Can take Tylenol Prn  - Ambulatory referral to Physical Therapy  2. Night sweats Can be due to Zoloft But patient is doing really well on this dose.  We will continue to monitor do not want to change to dose  3. Anxiety Continues to do well on Ativan Previous GDR's have failed Reassured the  patient  4. Bowel and bladder incontinence Is working with OT  5. Other hyperlipidemia Does not have any family history Discussed possible statins Patient wants to try diet modification first  6. Primary hypertension Blood pressure controlled on amlodipine and Tenormin  7. Severe episode of recurrent major depressive disorder, without psychotic features ([place]) Continue Zoloft and Remeron  8. Chronic nausea Resolved since been on Ativan Also on Pepcid  9. Screening for osteoporosis DEXA scheduled 10 insomnia On trazodone   Labs/tests ordered:  * No order type specified * Next appt:  02/05/2022

## 2021-11-04 ENCOUNTER — Encounter: Payer: Self-pay | Admitting: Internal Medicine

## 2021-11-04 ENCOUNTER — Ambulatory Visit: Payer: Medicare Other | Attending: Internal Medicine | Admitting: Physical Therapy

## 2021-11-04 DIAGNOSIS — M5459 Other low back pain: Secondary | ICD-10-CM | POA: Diagnosis not present

## 2021-11-04 DIAGNOSIS — M25612 Stiffness of left shoulder, not elsewhere classified: Secondary | ICD-10-CM | POA: Insufficient documentation

## 2021-11-04 DIAGNOSIS — M25512 Pain in left shoulder: Secondary | ICD-10-CM | POA: Diagnosis not present

## 2021-11-04 DIAGNOSIS — M6281 Muscle weakness (generalized): Secondary | ICD-10-CM | POA: Insufficient documentation

## 2021-11-04 NOTE — Therapy (Signed)
OUTPATIENT PHYSICAL THERAPY TREATMENT NOTE   Patient Name: Savannah Diaz MRN: [id number] DOB:01-16-1933, 88 y.o., female Today's Date: 11/04/2021  PCP: Virgie Dad, MD REFERRING PROVIDER: Gatha Mayer, MD  END OF SESSION:   PT End of Session - 11/04/21 1403     Visit Number 8    Date for PT Re-Evaluation 11/20/21    Authorization Type Medicare    Progress Note Due on Visit 10    PT Start Time 1402    PT Stop Time 1442    PT Time Calculation (min) 40 min    Activity Tolerance Patient tolerated treatment well    Behavior During Therapy [facility] for tasks assessed/performed                 Past Medical History:  Diagnosis Date   Anxiety    Arthritis    "qwhere; but it doesn't hurt" (05/12/2012)   Aspiration pneumonia ([place]) 03/21/2016   Cataract    left   Compression fracture of first lumbar vertebra ([place]) 07/2016   superior endplate   Depression    Dysphagia    "have had it for 7 yr; got worse 4 days ago" (05/12/2012)   Dysphagia, pharyngoesophageal phase - suspected 05/10/2012   GERD (gastroesophageal reflux disease)    Hypertension    Insomnia    Lumbago with sciatica, right side    Migraines    "menopause cured them" (05/12/2012)   Osteoporosis    Stage 3 chronic kidney disease ([place])    Uterine prolapse    Past Surgical History:  Procedure Laterality Date   CATARACT EXTRACTION W/ INTRAOCULAR LENS IMPLANT Left ? 2010   ESOPHAGEAL MANOMETRY N/A 11/08/2018   Procedure: ESOPHAGEAL MANOMETRY (EM);  Surgeon: Gatha Mayer, MD;  Location: [facility];  Service: Endoscopy;  Laterality: N/A;   ESOPHAGOGASTRODUODENOSCOPY N/A 05/10/2012   Procedure: ESOPHAGOGASTRODUODENOSCOPY (EGD);  Surgeon: Jerene Bears, MD;  Location: [facility];  Service: Gastroenterology;  Laterality: N/A;   TONSILLECTOMY  1940's   Patient Active Problem List   Diagnosis Date Noted   High risk medication use 08/08/2016   Influenza A 03/22/2016   Fall 03/21/2016   Acute on chronic  kidney failure II-III 03/21/2016   Transient alteration of awareness 05/10/2015   Hypokalemia 05/12/2012   Dysphagia oropharyngeal and pharyngeal 05/10/2012   Hypertension 04/28/2011   Osteoarthritis 04/28/2011   Agoraphobia 04/28/2011    REFERRING DIAG: Fecal smearing  - Primary R15.1  Mixed stress and urge urinary incontinence  N39.46   THERAPY DIAG:  Muscle weakness (generalized)  Rationale for Evaluation and Treatment Rehabilitation  PERTINENT HISTORY: G2P2, chronic nausea, peripheral neuropathy, anxiety/depression  PRECAUTIONS: NA  SUBJECTIVE: I am okay, about the same I think.  PAIN:  Are you having pain? No   SUBJECTIVE:  SUBJECTIVE STATEMENT:  Pt states that she has had 3 episodes where she would normally leak and did not. She has been using the urge suppression technique in the morning and has not had any accidents.   Patient states that she has been to see gynecologist and referred back to physical therapy for decreased pelvic floor motor control/strength and specific recommendation for biofeedback.   She is now back on lorazepam which dramatically helped with stomach pain for quite a while. But in the last month she has started not feeling quite as well. She is hesitant to increase her dose to see if that is helpful.    She has not been working on pelvic floor HEP because she feels very tired all the time.    She has started art class. She notices that when she is away from her apartment, she'll notice that she has not been to the bathroom for 3-4 hours.    Fluid intake: Yes: Trying to drink more water - not sure how much       PAIN:  Are you having pain? No     PATIENT GOALS patient wants to feel better; have control over bowels and bladder    BOWEL MOVEMENT Pain with  bowel movement: No Type of bowel movement:Frequency 3-4x a day, most comes out the first several times  and Strain No Fully empty rectum: Yes: but then she has more Leakage: Yes: small amounts that started in the last couple of weeks Pads: Yes: panty liner Fiber supplement: Yes: Metamucil   URINATION Pain with urination: No Fully empty bladder: Yes: - Stream:  WNL Urgency: No Frequency: every 3-4 hours Leakage:  None Pads: Yes: panty liner   INTERCOURSE Pain with intercourse:  Not currently sexually active     PREGNANCY Vaginal deliveries 2 Tearing Yes: - C-section deliveries 0 Currently pregnant No     OBJECTIVE:    COGNITION:            Overall cognitive status: Within functional limits for tasks assessed                          SENSATION:            Light touch: Appears intact            Proprioception: Appears intact     GAIT: Comments: slow shuffling gait pattern                POSTURE: rounded shoulders, forward head, and posterior pelvic tilt      PALPATION:                 External Perineal Exam vulvar redness/irritation; evidence of decrease in estrogen with labial fusion                             Internal Pelvic Floor pelvic floor atrophy   Patient confirms identification and approves PT to assess internal pelvic floor and treatment Yes   PELVIC MMT: updated 10/21/21  2/5 vaginal strength, endurance 8 seconds, 7 repeat contractions(from eval) 3/5 puborectalis strength  2/5 external anal sphincter strength       TONE: low   PROLAPSE: Not assessed   TODAY'S TREATMENT 11/04/21  Neuromuscular re-education: Biofeedback: Lying supine - sensors placed around anus Supine kegel 6-12 ratio work to rest Kegel with holding 2-6 seconds and able to get up to 8-10 mV consistently Self care:  Reviewed HEP for correct hold/rest times and correct number of reps/sets - added instructions to do the exercises in hook lying only for greater consistency and  less stress for her to get done at home  TREATMENT 10/31/21  Neuromuscular re-education: Biofeedback: Lying supine - sensors placed around anus Supine kegel 6-12 ratio work to [employer] with kegel Reverse calm with kegel Self care: Reviewed HEP for correct hold/rest times and correct number of reps/sets   TREATMENT 10/21/21  Neuromuscular re-education: Biofeedback: Supine with hip ER using block Resting tone 1 mV Side lying kegel    TODAY'S TREATMENT 09/25/21  Neuromuscular re-education: Biofeedback: Quick flicks 5 x 10 - educated on taking more time between reps as she was not relaxing all the way Up and down steps on biofeedback with highest being 8m Resting tone <1 mV  Self-care: Reviewed use of estrogen cream and reviewed how to apply  Re-assess vulva and showing patient areas to look at with mirror to take note if she notices tissue adhesions; everything looks normal and pat has already been checked for lichen sclerosis Reviewed HEP and made changes for increased resting time   TREATMENT 09/17/21 Neuromuscular re-education: Biofeedback: Quick flicks 5 x 10, 1161%and 50% strength Long holds 2 x 10 sec Stair stepper ascending, 3 floors, 5 seconds each, 5x each Self-care: Discussed external use of estrogen cream in addition to internal use - due to uncertain instruction of estrogen cream, pt encouraged to contact MD for clarification; reviewed how to apply  POC, ongoing appointments to see further progress       PATIENT EDUCATION:  Education details:Exercise progressions - 10-20 reps 3x/day and 5 long holds 6 sec each Person educated: Patient Education method: Explanation, Demonstration, Tactile cues, Verbal cues, and Handouts Education comprehension: verbalized understanding     HOME EXERCISE PROGRAM: Access Code: [email or web address]  ASSESSMENT:   CLINICAL IMPRESSION: Pt did well with biofeedback again today.  She did much better at holding the contraction for up  to 6 seconds for several contraction.  Sensors were placed in the center of the pelvic floor to work on coordination of all muscles.  Pt was educated on updates to HEP and instructions written out so pt can understand exactly what to do.  Pt will benefit from skilled PT to continue to address weakness and endurance of the pelvic floor for reduced episodes of incontinence      OBJECTIVE IMPAIRMENTS decreased coordination, decreased endurance, and decreased strength.    ACTIVITY LIMITATIONS continence   PARTICIPATION LIMITATIONS: community activity   PERSONAL FACTORS 1 comorbidity: 2 vaginal deliveries,  peripheral neuropathy  are also affecting patient's functional outcome.    REHAB POTENTIAL: Fair patient has not had success with conservative pelvic floor PT in the recent past   CLINICAL DECISION MAKING: Stable/uncomplicated   EVALUATION COMPLEXITY: Low     GOALS: Goals reviewed with patient? Yes   SHORT TERM GOALS: Target date: 09/11/2021 - updated 09/17/21   Pt will be independent with HEP.   Baseline:  Goal status: IN PROGRESS   LONG TERM GOALS: Target date: 11/20/2021 - updated 11/04/21     Pt will be independent with advanced HEP.    Baseline:  Goal status: IN PROGRESS   2.  Pt will demonstrate normal pelvic floor muscle tone and A/ROM, able to achieve 3/5 strength with contractions and 10 sec endurance, in order to provide appropriate lumbopelvic support in functional activities.    Baseline: 3/5  Goal status: IN PROGRESS  3.  Pt will report sensation of increased control over bowel/bladder with no episodes incontinence.  Baseline: no episodes of urinary incontinence and I can tell when I have to go  Goal status: IN PROGRESS/PARTIAL MET   4.  Pt will be compliant with appropriate bowel/bladder schedule and good habits in order to help improve control/continence.  Baseline: I empty my bladder regularly and have a bowel movement in the morning and then another one after  the first time. Goal status: IN PROGRESS     PLAN: PT FREQUENCY: 1x/week   PT DURATION: 8 weeks   PLANNED INTERVENTIONS: Therapeutic exercises, Therapeutic activity, Neuromuscular re-education, Balance training, Gait training, Patient/Family education, Joint mobilization, Dry Needling, Biofeedback, and Manual therapy   PLAN FOR NEXT SESSION: Biofeedback with sensors on posterior pelvic floor - 6-12 wor-rest with max 25 mV - re-assess goals and final HEP - can hold up to 6 seconds and 8-56m; d/c next  [employer] PT 11/04/21 2:03 PM

## 2021-11-06 ENCOUNTER — Other Ambulatory Visit: Payer: Self-pay | Admitting: Internal Medicine

## 2021-11-06 DIAGNOSIS — M545 Low back pain, unspecified: Secondary | ICD-10-CM

## 2021-11-11 ENCOUNTER — Ambulatory Visit: Payer: Medicare Other | Admitting: Physical Therapy

## 2021-11-11 DIAGNOSIS — M6281 Muscle weakness (generalized): Secondary | ICD-10-CM

## 2021-11-11 DIAGNOSIS — R279 Unspecified lack of coordination: Secondary | ICD-10-CM | POA: Diagnosis not present

## 2021-11-11 NOTE — Therapy (Signed)
OUTPATIENT PHYSICAL THERAPY TREATMENT NOTE   Patient Name: Savannah Diaz MRN: [id number] DOB:1932/08/25, 88 y.o., female Today's Date: 11/11/2021  PCP: Virgie Dad, MD REFERRING PROVIDER: Gatha Mayer, MD  END OF SESSION:   PT End of Session - 11/11/21 1446     Visit Number 9    Date for PT Re-Evaluation 11/20/21    Authorization Type Medicare    Progress Note Due on Visit 10    PT Start Time 1400    PT Stop Time 1445    PT Time Calculation (min) 45 min    Activity Tolerance Patient tolerated treatment well    Behavior During Therapy [facility] for tasks assessed/performed                  Past Medical History:  Diagnosis Date   Anxiety    Arthritis    "qwhere; but it doesn't hurt" (05/12/2012)   Aspiration pneumonia ([place]) 03/21/2016   Cataract    left   Compression fracture of first lumbar vertebra ([place]) 07/2016   superior endplate   Depression    Dysphagia    "have had it for 7 yr; got worse 4 days ago" (05/12/2012)   Dysphagia, pharyngoesophageal phase - suspected 05/10/2012   GERD (gastroesophageal reflux disease)    Hypertension    Insomnia    Lumbago with sciatica, right side    Migraines    "menopause cured them" (05/12/2012)   Osteoporosis    Stage 3 chronic kidney disease ([place])    Uterine prolapse    Past Surgical History:  Procedure Laterality Date   CATARACT EXTRACTION W/ INTRAOCULAR LENS IMPLANT Left ? 2010   ESOPHAGEAL MANOMETRY N/A 11/08/2018   Procedure: ESOPHAGEAL MANOMETRY (EM);  Surgeon: Gatha Mayer, MD;  Location: [facility];  Service: Endoscopy;  Laterality: N/A;   ESOPHAGOGASTRODUODENOSCOPY N/A 05/10/2012   Procedure: ESOPHAGOGASTRODUODENOSCOPY (EGD);  Surgeon: Jerene Bears, MD;  Location: [facility];  Service: Gastroenterology;  Laterality: N/A;   TONSILLECTOMY  1940's   Patient Active Problem List   Diagnosis Date Noted   High risk medication use 08/08/2016   Influenza A 03/22/2016   Fall 03/21/2016   Acute on chronic  kidney failure II-III 03/21/2016   Transient alteration of awareness 05/10/2015   Hypokalemia 05/12/2012   Dysphagia oropharyngeal and pharyngeal 05/10/2012   Hypertension 04/28/2011   Osteoarthritis 04/28/2011   Agoraphobia 04/28/2011    REFERRING DIAG: Fecal smearing  - Primary R15.1  Mixed stress and urge urinary incontinence  N39.46   THERAPY DIAG:  Muscle weakness (generalized)  Unspecified lack of coordination  Rationale for Evaluation and Treatment Rehabilitation  PERTINENT HISTORY: G2P2, chronic nausea, peripheral neuropathy, anxiety/depression  PRECAUTIONS: NA  SUBJECTIVE: I am okay, about the same I think.  PAIN:  Are you having pain? No   SUBJECTIVE:  I have been doing the exercises just  having pain around the anus.  I think I am better with less leakage.  Doing the quick flicks helps                                 SUBJECTIVE STATEMENT:  Pt states that she has had 3 episodes where she would normally leak and did not. She has been using the urge suppression technique in the morning and has not had any accidents.   Patient states that she has been to see gynecologist and referred back to physical therapy for decreased pelvic floor motor control/strength and specific recommendation for biofeedback.   She is now back on lorazepam which dramatically helped with stomach pain for quite a while. But in the last month she has started not feeling quite as well. She is hesitant to increase her dose to see if that is helpful.    She has not been working on pelvic floor HEP because she feels very tired all the time.    She has started art class. She notices that when she is away from her apartment, she'll notice that she has not been to the bathroom for 3-4 hours.    Fluid intake: Yes: Trying to drink more water - not sure how  much       PAIN:  Are you having pain? No     PATIENT GOALS patient wants to feel better; have control over bowels and bladder    BOWEL MOVEMENT Pain with bowel movement: No Type of bowel movement:Frequency 3-4x a day, most comes out the first several times  and Strain No Fully empty rectum: Yes: but then she has more Leakage: Yes: small amounts that started in the last couple of weeks Pads: Yes: panty liner Fiber supplement: Yes: Metamucil   URINATION Pain with urination: No Fully empty bladder: Yes: - Stream:  WNL Urgency: No Frequency: every 3-4 hours Leakage:  None Pads: Yes: panty liner   INTERCOURSE Pain with intercourse:  Not currently sexually active     PREGNANCY Vaginal deliveries 2 Tearing Yes: - C-section deliveries 0 Currently pregnant No     OBJECTIVE:    COGNITION:            Overall cognitive status: Within functional limits for tasks assessed                          SENSATION:            Light touch: Appears intact            Proprioception: Appears intact     GAIT: Comments: slow shuffling gait pattern                POSTURE: rounded shoulders, forward head, and posterior pelvic tilt      PALPATION:                 External Perineal Exam vulvar redness/irritation; evidence of decrease in estrogen with labial fusion                             Internal Pelvic Floor pelvic floor atrophy   Patient confirms identification and approves PT to assess internal pelvic floor and treatment Yes   PELVIC MMT: updated 10/21/21  2/5 vaginal strength, endurance 8 seconds, 7 repeat contractions(from eval) 3/5  puborectalis strength  2/5 external anal sphincter strength       TONE: low   PROLAPSE: Not assessed   TODAY'S TREATMENT 11/11/21  Neuromuscular re-education: Biofeedback: Lying supine - sensors placed around anus Supine kegel 6-12 ratio work to rest; 10-10 ratio; 4-8 ratio Kegel with holding 2-6 seconds and able to get up to 8-10  mV consistently Unable to hold more than 6 sec, but overall much better Self care: using desert harvest or other pain reducing moisturizer around the anus  TREATMENT 11/04/21  Neuromuscular re-education: Biofeedback: Lying supine - sensors placed around anus Supine kegel 6-12 ratio work to rest Kegel with holding 2-6 seconds and able to get up to 8-10 mV consistently Self care: Reviewed HEP for correct hold/rest times and correct number of reps/sets - added instructions to do the exercises in hook lying only for greater consistency and less stress for her to get done at home  TREATMENT 10/31/21  Neuromuscular re-education: Biofeedback: Lying supine - sensors placed around anus Supine kegel 6-12 ratio work to [employer] with kegel Reverse calm with kegel Self care: Reviewed HEP for correct hold/rest times and correct number of reps/sets   TREATMENT 10/21/21  Neuromuscular re-education: Biofeedback: Supine with hip ER using block Resting tone 1 mV Side lying kegel    TODAY'S TREATMENT 09/25/21  Neuromuscular re-education: Biofeedback: Quick flicks 5 x 10 - educated on taking more time between reps as she was not relaxing all the way Up and down steps on biofeedback with highest being 56m Resting tone <1 mV  Self-care: Reviewed use of estrogen cream and reviewed how to apply  Re-assess vulva and showing patient areas to look at with mirror to take note if she notices tissue adhesions; everything looks normal and pat has already been checked for lichen sclerosis Reviewed HEP and made changes for increased resting time          PATIENT EDUCATION:  Education details:Exercise progressions - 10-20 reps 3x/day and 5 long holds 6 sec each Person educated: Patient Education method: Explanation, Demonstration, Tactile cues, Verbal cues, and Handouts Education comprehension: verbalized understanding     HOME EXERCISE PROGRAM: Access Code: [email or web address]  ASSESSMENT:    CLINICAL IMPRESSION: Pt did well with biofeedback and re-assessed.  Today's session to ensure she is successfully transitioned to HEP with all questions answered. Pt will d/c with HEP today, goals met as seen above.      OBJECTIVE IMPAIRMENTS decreased coordination, decreased endurance, and decreased strength.    ACTIVITY LIMITATIONS continence   PARTICIPATION LIMITATIONS: community activity   PERSONAL FACTORS 1 comorbidity: 2 vaginal deliveries,  peripheral neuropathy  are also affecting patient's functional outcome.    REHAB POTENTIAL: Fair patient has not had success with conservative pelvic floor PT in the recent past   CLINICAL DECISION MAKING: Stable/uncomplicated   EVALUATION COMPLEXITY: Low     GOALS: Goals reviewed with patient? Yes   SHORT TERM GOALS: Target date: 09/11/2021 - updated 09/17/21   Pt will be independent with HEP.   Baseline:  Goal status: MET   LONG TERM GOALS: Target date: 11/20/2021 - updated 11/04/21     Pt will be independent with advanced HEP.    Baseline:  Goal status: MET   2.  Pt will demonstrate normal pelvic floor muscle tone and A/ROM, able to achieve 3/5 strength with contractions and 10 sec endurance, in order to provide appropriate lumbopelvic support in functional activities.    Baseline: 3/5 , 6 sec hold Goal  status: PARTIALLY MET   3.  Pt will report sensation of increased control over bowel/bladder with no episodes incontinence.  Baseline: no episodes of urinary incontinence and I can tell when I have to go  Goal status: MET   4.  Pt will be compliant with appropriate bowel/bladder schedule and good habits in order to help improve control/continence.  Baseline: I empty my bladder regularly and have a bowel movement in the morning and then another one after the first time. Goal status: MET     PLAN: PT FREQUENCY: 1x/week   PT DURATION: 8 weeks   PLANNED INTERVENTIONS: Therapeutic exercises, Therapeutic activity,  Neuromuscular re-education, Balance training, Gait training, Patient/Family education, Joint mobilization, Dry Needling, Biofeedback, and Manual therapy   PLAN FOR NEXT SESSION: d/c today  Gustavus Bryant, PT 11/11/21 3:32 PM   PHYSICAL THERAPY DISCHARGE SUMMARY  Visits from Start of Care: 9  Current functional level related to goals / functional outcomes: See Jethro Poling goals   Remaining deficits: See above details   Education / Equipment: HEP   Patient agrees to discharge. Patient goals were met. Patient is being discharged due to being pleased with the current functional level.  Gustavus Bryant, PT 11/11/21 5:14 PM

## 2021-11-13 ENCOUNTER — Ambulatory Visit: Payer: Medicare Other | Admitting: Physical Therapy

## 2021-11-13 ENCOUNTER — Encounter: Payer: Self-pay | Admitting: Physical Therapy

## 2021-11-13 ENCOUNTER — Other Ambulatory Visit: Payer: Self-pay

## 2021-11-13 DIAGNOSIS — M25612 Stiffness of left shoulder, not elsewhere classified: Secondary | ICD-10-CM

## 2021-11-13 DIAGNOSIS — M5459 Other low back pain: Secondary | ICD-10-CM

## 2021-11-13 DIAGNOSIS — M6281 Muscle weakness (generalized): Secondary | ICD-10-CM

## 2021-11-13 DIAGNOSIS — M25512 Pain in left shoulder: Secondary | ICD-10-CM | POA: Diagnosis not present

## 2021-11-13 NOTE — Therapy (Signed)
OUTPATIENT PHYSICAL THERAPY THORACOLUMBAR/SHOULDER EVALUATION   Patient Name: Savannah Diaz MRN: [id number] DOB:05-04-1932, 88 y.o., female Today's Date: 11/13/2021   PT End of Session - 11/13/21 1357     Visit Number 1    Date for PT Re-Evaluation 01/08/22    Authorization Type Medicare    Progress Note Due on Visit 10    PT Start Time 1400    PT Stop Time 1440    PT Time Calculation (min) 40 min    Activity Tolerance Patient tolerated treatment well             Past Medical History:  Diagnosis Date   Anxiety    Arthritis    "qwhere; but it doesn't hurt" (05/12/2012)   Aspiration pneumonia ([place]) 03/21/2016   Cataract    left   Compression fracture of first lumbar vertebra ([place]) 07/2016   superior endplate   Depression    Dysphagia    "have had it for 7 yr; got worse 4 days ago" (05/12/2012)   Dysphagia, pharyngoesophageal phase - suspected 05/10/2012   GERD (gastroesophageal reflux disease)    Hypertension    Insomnia    Lumbago with sciatica, right side    Migraines    "menopause cured them" (05/12/2012)   Osteoporosis    Stage 3 chronic kidney disease ([place])    Uterine prolapse    Past Surgical History:  Procedure Laterality Date   CATARACT EXTRACTION W/ INTRAOCULAR LENS IMPLANT Left ? 2010   ESOPHAGEAL MANOMETRY N/A 11/08/2018   Procedure: ESOPHAGEAL MANOMETRY (EM);  Surgeon: Gatha Mayer, MD;  Location: [facility];  Service: Endoscopy;  Laterality: N/A;   ESOPHAGOGASTRODUODENOSCOPY N/A 05/10/2012   Procedure: ESOPHAGOGASTRODUODENOSCOPY (EGD);  Surgeon: Jerene Bears, MD;  Location: [facility];  Service: Gastroenterology;  Laterality: N/A;   TONSILLECTOMY  1940's   Patient Active Problem List   Diagnosis Date Noted   High risk medication use 08/08/2016   Influenza A 03/22/2016   Fall 03/21/2016   Acute on chronic kidney failure II-III 03/21/2016   Transient alteration of awareness 05/10/2015   Hypokalemia 05/12/2012   Dysphagia oropharyngeal and  pharyngeal 05/10/2012   Hypertension 04/28/2011   Osteoarthritis 04/28/2011   Agoraphobia 04/28/2011    PCP: Veleta Miners MD  REFERRING PROVIDER: Veleta Miners MD  REFERRING DIAG: M54,50, G89.29 Chronic bil low back pain without sciatica; left shoulder pain  Rationale for Evaluation and Treatment Rehabilitation  THERAPY DIAG:  Back pain; weakness  ONSET DATE: 09/12/2021  SUBJECTIVE:  SUBJECTIVE STATEMENT: Bil LBP exacerbation (it goes out sometimes) and left shoulder pain x 2 months; difficulty elevating arm PERTINENT HISTORY:  Peripheral neuropathy Incontinence bladder/bowel  Right hand dominant;  scoliosis; osteoporosis; 2018 compression fracture L1 when fell backwards  PAIN:  Are you having pain? Yes NPRS scale: 2-3/10 Pain location: left shoulder (upper trap region); bil low back across (no buttock or LE symptoms)   Aggravating factors: mornings; elevating left arm; reaching for a skillet in cabinet;  walking 2,000 steps on pedometer;  prolonged standing Relieving factors: sitting, lying OK  PRECAUTIONS: None  WEIGHT BEARING RESTRICTIONS No  FALLS:  Has patient fallen in last 6 months? No  LIVING ENVIRONMENT: Lives with: lives with their family and [facility] [place]   OCCUPATION: retired   PLOF: Independent with basic ADLs  PATIENT GOALS want to feel better.  Help shoulder pain; stand in the kitchen; not have to lie down after a walk   OBJECTIVE:   DIAGNOSTIC FINDINGS:  None recently  PATIENT SURVEYS:  FOTO shoulder 54%   COGNITION:  Overall cognitive status: Within functional limits for tasks assessed      MUSCLE LENGTH: Hamstrings: Right 50 deg; Left 50 deg Thomas test: Right 0 deg; Left 0 deg  POSTURE: rounded shoulders and decreased lumbar lordosis; mild  forward trunk   SHOULDER ROM:  RIGHT:  FLEXION:  140   ABDUCTION:   163  EXTERNAL ROTATION:   85  INTERNAL ROTATION: t8                                  LEFT:    FLEXION: 108     ABDUCTION:     65   EXTERNAL ROTATION:   63    INTERNAL ROTATION: L5 SHOULDER STRENGTH :  left:  Flexion:  4-/5;  ABDUCTION 2+/5, external and internal rotation 4/5 LUMBAR ROM:   Active  A/PROM  eval  Flexion 50  Extension 5  Right lateral flexion 35  Left lateral flexion 35  Right rotation   Left rotation    (Blank rows = not tested)  LOWER EXTREMITY ROM:     TRUNK STRENGTH:  Decreased activation of transverse abdominus muscles; abdominals 4-/5; decreased activation of lumbar multifidi; trunk extensors 4-/5   LOWER EXTREMITY MMT:  Able to kneel down on floor and get back up with UE assist on chair;  able to sit to stand without UE assist; hip abduction 4/5   FUNCTIONAL TESTS:  5 times sit to stand: 17 Timed up and go (TUG): 12  no UE use  GAIT:  Comments: no assistive device;  WFLs    TODAY'S TREATMENT  Discussed plan of care   PATIENT EDUCATION:  Education details: plan of care Person educated: Patient Education method: Explanation Education comprehension: verbalized understanding   HOME EXERCISE PROGRAM: To be started next visit  ASSESSMENT:  CLINICAL IMPRESSION: Patient is a 88 y.o. female who was seen today for physical therapy evaluation and treatment for exacerbation of bil low back pain as well as acute left shoulder pain and stiffness.   The patient would benefit from PT to address trunk and hip range of motion deficits, strength asymmetries in lumbo/pelvic and hip regions and pain levels that are currently affecting activities of daily living at home including sitting, standing and walking.  The patient would also benefit from skilled PT to address shoulder range of motion limitations particularly shoulder elevation and internal  rotation as well as glenohumeral, scapular and  thoracic joint hypomobility.  The patient would also benefit from strengthening to correct asymmetries including weakness in rotator cuff and periscapular musculature and decreasing overuse of the upper trapezius.  These impairments  and pain contribute to difficulties with home  ADLS like reaching overhead, lifting objects to higher shelves, reaching behind the back.       OBJECTIVE IMPAIRMENTS decreased activity tolerance, difficulty walking, decreased ROM, decreased strength, impaired perceived functional ability, impaired UE functional use, and pain.   ACTIVITY LIMITATIONS carrying, lifting, standing, bathing, dressing, reach over head, and locomotion level  PARTICIPATION LIMITATIONS: meal prep, cleaning, laundry, and community activity  PERSONAL FACTORS Age, Time since onset of injury/illness/exacerbation, and 1-2 comorbidities: multiple body regions affected, peripheral neuropathy, scoliosis, osteoporosis  are also affecting patient's functional outcome.   REHAB POTENTIAL: Good  CLINICAL DECISION MAKING: moderately complex EVALUATION COMPLEXITY: moderate   GOALS: Goals reviewed with patient? Yes  SHORT TERM GOALS: Target date: 12/11/2021  The patient will demonstrate knowledge of basic self care strategies and exercises to promote healing   Baseline: Goal status: INITIAL  2.  The patient will report a 30% improvement in back and shoulder pain levels with functional activities including reaching overhead, walking, standing Baseline:  Goal status: INITIAL  3.  The patient will have improved trunk flexor and extensor muscle strength and hip strength to at least 4/5 needed for lifting medium weight objects such as grocery bags  Baseline:  Goal status: INITIAL  4.  The patient will have improved shoulder elevation ROM to at least 122 degrees needed for grooming/dressing purposes as well as reaching high shelves  Baseline:  Goal status: INITIAL   LONG TERM GOALS: Target date:  01/08/2022  The patient will be independent in a safe self progression of a home exercise program to promote further recovery of function   Baseline:  Goal status: INITIAL  2.  The patient will report a 65% improvement in back and shoulder pain levels with functional activities which are currently difficult  including walking, standing, reaching overhead Baseline:  Goal status: INITIAL  3.  The patient will have improved shoulder elevation ROM to at least 140 degrees needed for grooming/dressing purposes as well as reaching high shelves  Baseline:  Goal status: INITIAL  4.  The patient will have grossly 4/5 strength needed to lift and lower a 1-3# object from a high shelf  Baseline:  Goal status: INITIAL  5.  The patient will have improved FOTO score to   63%    indicating improved function with less pain  Baseline:  Goal status: INITIAL  6.  5x sit to stand test improved to 15 sec INITIAL     PLAN: PT FREQUENCY: 2x/week  PT DURATION: 8 weeks  PLANNED INTERVENTIONS: Therapeutic exercises, Therapeutic activity, Neuromuscular re-education, Patient/Family education, Self Care, Joint mobilization, Aquatic Therapy, Dry Needling, Electrical stimulation, Spinal mobilization, Cryotherapy, Moist heat, Traction, Ultrasound, Manual therapy, and Re-evaluation.  PLAN FOR NEXT SESSION: Try Nu-Step;  supine Broomstick (cane) ex's;  flexion biased/neutral lumbo/pelvic/hip ex's (stenosis-like symptoms); yellow band rows and extensions; deltoid isometrics;  give clear detailed instructions with HEP  Ruben Im, PT 11/13/21 4:58 PM Phone: [phone] Fax: [phone]

## 2021-11-15 DIAGNOSIS — H1131 Conjunctival hemorrhage, right eye: Secondary | ICD-10-CM | POA: Diagnosis not present

## 2021-11-18 ENCOUNTER — Ambulatory Visit: Payer: Medicare Other | Admitting: Physical Therapy

## 2021-11-18 ENCOUNTER — Encounter: Payer: Medicare Other | Admitting: Physical Therapy

## 2021-11-18 DIAGNOSIS — M25612 Stiffness of left shoulder, not elsewhere classified: Secondary | ICD-10-CM | POA: Diagnosis not present

## 2021-11-18 DIAGNOSIS — M6281 Muscle weakness (generalized): Secondary | ICD-10-CM | POA: Diagnosis not present

## 2021-11-18 DIAGNOSIS — M25512 Pain in left shoulder: Secondary | ICD-10-CM

## 2021-11-18 DIAGNOSIS — M5459 Other low back pain: Secondary | ICD-10-CM

## 2021-11-18 NOTE — Therapy (Signed)
OUTPATIENT PHYSICAL THERAPY THORACOLUMBAR/SHOULDER EVALUATION   Patient Name: Savannah Diaz MRN: [id number] DOB:[birth year], 88 y.o., female Today's Date: 11/18/2021   PT End of Session - 11/18/21 1443     Visit Number 2    Date for PT Re-Evaluation 01/08/22    Authorization Type Medicare    PT Start Time 1402    PT Stop Time 1444    PT Time Calculation (min) 42 min    Activity Tolerance Patient tolerated treatment well    Behavior During Therapy [facility] for tasks assessed/performed              Past Medical History:  Diagnosis Date   Anxiety    Arthritis    "qwhere; but it doesn't hurt" (05/12/2012)   Aspiration pneumonia ([place]) 03/21/2016   Cataract    left   Compression fracture of first lumbar vertebra ([place]) 07/2016   superior endplate   Depression    Dysphagia    "have had it for 7 yr; got worse 4 days ago" (05/12/2012)   Dysphagia, pharyngoesophageal phase - suspected 05/10/2012   GERD (gastroesophageal reflux disease)    Hypertension    Insomnia    Lumbago with sciatica, right side    Migraines    "menopause cured them" (05/12/2012)   Osteoporosis    Stage 3 chronic kidney disease ([place])    Uterine prolapse    Past Surgical History:  Procedure Laterality Date   CATARACT EXTRACTION W/ INTRAOCULAR LENS IMPLANT Left ? 2010   ESOPHAGEAL MANOMETRY N/A 11/08/2018   Procedure: ESOPHAGEAL MANOMETRY (EM);  Surgeon: Gatha Mayer, MD;  Location: [facility];  Service: Endoscopy;  Laterality: N/A;   ESOPHAGOGASTRODUODENOSCOPY N/A 05/10/2012   Procedure: ESOPHAGOGASTRODUODENOSCOPY (EGD);  Surgeon: Jerene Bears, MD;  Location: [facility];  Service: Gastroenterology;  Laterality: N/A;   TONSILLECTOMY  1940's   Patient Active Problem List   Diagnosis Date Noted   High risk medication use 08/08/2016   Influenza A 03/22/2016   Fall 03/21/2016   Acute on chronic kidney failure II-III 03/21/2016   Transient alteration of awareness 05/10/2015   Hypokalemia 05/12/2012    Dysphagia oropharyngeal and pharyngeal 05/10/2012   Hypertension 04/28/2011   Osteoarthritis 04/28/2011   Agoraphobia 04/28/2011    PCP: Veleta Miners MD  REFERRING PROVIDER: Veleta Miners MD  REFERRING DIAG: M54,50, G89.29 Chronic bil low back pain without sciatica; left shoulder pain  Rationale for Evaluation and Treatment Rehabilitation  THERAPY DIAG:  Back pain; weakness  ONSET DATE: 09/12/2021  SUBJECTIVE:  SUBJECTIVE STATEMENT: No new reports, feeling the same.   PERTINENT HISTORY:  Peripheral neuropathy Incontinence bladder/bowel  Right hand dominant;  scoliosis; osteoporosis; 2018 compression fracture L1 when fell backwards  PAIN:  Are you having pain? Yes NPRS scale: 3/10 Pain location: left shoulder - lateral deltoid and upper trap   Aggravating factors: mornings; elevating left arm; reaching for a skillet in cabinet;  walking 2,000 steps on pedometer;  prolonged standing Relieving factors: sitting, lying OK  PRECAUTIONS: None  WEIGHT BEARING RESTRICTIONS No  FALLS:  Has patient fallen in last 6 months? No  LIVING ENVIRONMENT: Lives with: lives with their family and [facility] Home [place]   OCCUPATION: retired   PLOF: Independent with basic ADLs  PATIENT GOALS want to feel better.  Help shoulder pain; stand in the kitchen; not have to lie down after a walk   OBJECTIVE:   DIAGNOSTIC FINDINGS:  None recently  PATIENT SURVEYS:  FOTO shoulder 54%   COGNITION:  Overall cognitive status: Within functional limits for tasks assessed      MUSCLE LENGTH: Hamstrings: Right 50 deg; Left 50 deg Thomas test: Right 0 deg; Left 0 deg  POSTURE: rounded shoulders and decreased lumbar lordosis; mild forward trunk   SHOULDER ROM:  RIGHT:  FLEXION:  140   ABDUCTION:   163   EXTERNAL ROTATION:   85  INTERNAL ROTATION: t8                                  LEFT:    FLEXION: 108     ABDUCTION:     65   EXTERNAL ROTATION:   63    INTERNAL ROTATION: L5 SHOULDER STRENGTH :  left:  Flexion:  4-/5;  ABDUCTION 2+/5, external and internal rotation 4/5 LUMBAR ROM:   Active  A/PROM  eval  Flexion 50  Extension 5  Right lateral flexion 35  Left lateral flexion 35  Right rotation   Left rotation    (Blank rows = not tested)  LOWER EXTREMITY ROM:     TRUNK STRENGTH:  Decreased activation of transverse abdominus muscles; abdominals 4-/5; decreased activation of lumbar multifidi; trunk extensors 4-/5   LOWER EXTREMITY MMT:  Able to kneel down on floor and get back up with UE assist on chair;  able to sit to stand without UE assist; hip abduction 4/5   FUNCTIONAL TESTS:  5 times sit to stand: 17 Timed up and go (TUG): 12  no UE use  GAIT:  Comments: no assistive device;  WFLs    TODAY'S TREATMENT  Date: 11/18/21 HEP established-see below  Nu-stp L 1 x 7 min Supine on wedge for incline - cane used for AAROM shoulder flexion, external rotation, abduction Piriformis stretch in supine Shoulder isometric standing - flex, abduction, ext  PATIENT EDUCATION:  Education details: Access Code: [email or web address] Person educated: Patient Education method: Explanation, Demonstration, Tactile cues, Verbal cues, and Handouts Education comprehension: verbalized understanding and returned demonstration   HOME EXERCISE PROGRAM: Access Code: [email or web address] URL: [email or web address] Date: 11/18/2021 Prepared by: Jari Favre  Exercises - Supine Shoulder Flexion Extension AAROM with [place]  - 1 x daily - 7 x weekly - 3 sets - 10 reps - Seated Figure 4 Piriformis Stretch  - 1 x daily - 7 x weekly - 1 sets - 3 reps - 30 hold - Isometric Shoulder Abduction at Wall  - 1 x daily - 7 x weekly - 1  sets - 10 reps - 5 sec hold - Isometric Shoulder Flexion at Wall  - 1 x  daily - 7 x weekly - 3 sets - 10 reps - Isometric Shoulder Extension at Wall  - 1 x daily - 7 x weekly - 3 sets - 10 reps ASSESSMENT:  CLINICAL IMPRESSION: No goals met yet due to today being the initial f/u after eval.  Today's session focused on establishing the initial HEP with shoulder ROM and isometric strength.  Pt was able to understand exercises clearly.  She reports no increased pain after today's treatment  She did report some small amount of pain when doing AAROM and nustep ex's . Pt will benefit from skilled PT to continue to work on strength and ROM needed for functional activities.   OBJECTIVE IMPAIRMENTS decreased activity tolerance, difficulty walking, decreased ROM, decreased strength, impaired perceived functional ability, impaired UE functional use, and pain.   ACTIVITY LIMITATIONS carrying, lifting, standing, bathing, dressing, reach over head, and locomotion level  PARTICIPATION LIMITATIONS: meal prep, cleaning, laundry, and community activity  PERSONAL FACTORS Age, Time since onset of injury/illness/exacerbation, and 1-2 comorbidities: multiple body regions affected, peripheral neuropathy, scoliosis, osteoporosis  are also affecting patient's functional outcome.   REHAB POTENTIAL: Good  CLINICAL DECISION MAKING: moderately complex EVALUATION COMPLEXITY: moderate   GOALS: Goals reviewed with patient? Yes  SHORT TERM GOALS: Target date: 12/11/2021  The patient will demonstrate knowledge of basic self care strategies and exercises to promote healing   Baseline: Goal status: INITIAL  2.  The patient will report a 30% improvement in back and shoulder pain levels with functional activities including reaching overhead, walking, standing Baseline:  Goal status: INITIAL  3.  The patient will have improved trunk flexor and extensor muscle strength and hip strength to at least 4/5 needed for lifting medium weight objects such as grocery bags  Baseline:  Goal status:  INITIAL  4.  The patient will have improved shoulder elevation ROM to at least 122 degrees needed for grooming/dressing purposes as well as reaching high shelves  Baseline:  Goal status: INITIAL   LONG TERM GOALS: Target date: 01/08/2022  The patient will be independent in a safe self progression of a home exercise program to promote further recovery of function   Baseline:  Goal status: INITIAL  2.  The patient will report a 65% improvement in back and shoulder pain levels with functional activities which are currently difficult  including walking, standing, reaching overhead Baseline:  Goal status: INITIAL  3.  The patient will have improved shoulder elevation ROM to at least 140 degrees needed for grooming/dressing purposes as well as reaching high shelves  Baseline:  Goal status: INITIAL  4.  The patient will have grossly 4/5 strength needed to lift and lower a 1-3# object from a high shelf  Baseline:  Goal status: INITIAL  5.  The patient will have improved FOTO score to   63%    indicating improved function with less pain  Baseline:  Goal status: INITIAL  6.  5x sit to stand test improved to 15 sec INITIAL     PLAN: PT FREQUENCY: 2x/week  PT DURATION: 8 weeks  PLANNED INTERVENTIONS: Therapeutic exercises, Therapeutic activity, Neuromuscular re-education, Patient/Family education, Self Care, Joint mobilization, Aquatic Therapy, Dry Needling, Electrical stimulation, Spinal mobilization, Cryotherapy, Moist heat, Traction, Ultrasound, Manual therapy, and Re-evaluation.  PLAN FOR NEXT SESSION: continue to progress and f/u on initial HEP; Nu-Step;  supine Broomstick (cane) ex's;  flexion biased/neutral lumbo/pelvic/hip  ex's (stenosis-like symptoms); yellow band rows and extensions; deltoid isometrics;  give clear detailed instructions with HEP  Gustavus Bryant, PT 11/18/21 2:44 PM

## 2021-11-20 ENCOUNTER — Ambulatory Visit: Payer: Medicare Other | Admitting: Physical Therapy

## 2021-11-20 DIAGNOSIS — M6281 Muscle weakness (generalized): Secondary | ICD-10-CM

## 2021-11-20 DIAGNOSIS — M25512 Pain in left shoulder: Secondary | ICD-10-CM

## 2021-11-20 DIAGNOSIS — M25612 Stiffness of left shoulder, not elsewhere classified: Secondary | ICD-10-CM

## 2021-11-20 DIAGNOSIS — M5459 Other low back pain: Secondary | ICD-10-CM | POA: Diagnosis not present

## 2021-11-20 NOTE — Therapy (Signed)
OUTPATIENT PHYSICAL THERAPY THORACOLUMBAR/SHOULDER PROGRESS NOTE   Patient Name: Savannah Diaz MRN: [id number] DOB:[birth year], 88 y.o., female Today's Date: 11/20/2021   PT End of Session - 11/20/21 1139     Visit Number 3    Date for PT Re-Evaluation 01/08/22    Authorization Type Medicare    Progress Note Due on Visit 10    PT Start Time 1143    PT Stop Time 1222    PT Time Calculation (min) 39 min    Activity Tolerance Patient tolerated treatment well              Past Medical History:  Diagnosis Date   Anxiety    Arthritis    "qwhere; but it doesn't hurt" (05/12/2012)   Aspiration pneumonia ([place]) 03/21/2016   Cataract    left   Compression fracture of first lumbar vertebra ([place]) 07/2016   superior endplate   Depression    Dysphagia    "have had it for 7 yr; got worse 4 days ago" (05/12/2012)   Dysphagia, pharyngoesophageal phase - suspected 05/10/2012   GERD (gastroesophageal reflux disease)    Hypertension    Insomnia    Lumbago with sciatica, right side    Migraines    "menopause cured them" (05/12/2012)   Osteoporosis    Stage 3 chronic kidney disease ([place])    Uterine prolapse    Past Surgical History:  Procedure Laterality Date   CATARACT EXTRACTION W/ INTRAOCULAR LENS IMPLANT Left ? 2010   ESOPHAGEAL MANOMETRY N/A 11/08/2018   Procedure: ESOPHAGEAL MANOMETRY (EM);  Surgeon: Gatha Mayer, MD;  Location: [facility];  Service: Endoscopy;  Laterality: N/A;   ESOPHAGOGASTRODUODENOSCOPY N/A 05/10/2012   Procedure: ESOPHAGOGASTRODUODENOSCOPY (EGD);  Surgeon: Jerene Bears, MD;  Location: [facility];  Service: Gastroenterology;  Laterality: N/A;   TONSILLECTOMY  1940's   Patient Active Problem List   Diagnosis Date Noted   High risk medication use 08/08/2016   Influenza A 03/22/2016   Fall 03/21/2016   Acute on chronic kidney failure II-III 03/21/2016   Transient alteration of awareness 05/10/2015   Hypokalemia 05/12/2012   Dysphagia oropharyngeal  and pharyngeal 05/10/2012   Hypertension 04/28/2011   Osteoarthritis 04/28/2011   Agoraphobia 04/28/2011    PCP: Veleta Miners MD  REFERRING PROVIDER: Veleta Miners MD  REFERRING DIAG: M54,50, G89.29 Chronic bil low back pain without sciatica; left shoulder pain  Rationale for Evaluation and Treatment Rehabilitation  THERAPY DIAG:  Back pain; weakness  ONSET DATE: 09/12/2021  SUBJECTIVE:  SUBJECTIVE STATEMENT: I used the Nu-Step twice at the gym at [facility] yesterday.  I started a week or 2 ago doing pelvic tilts and bridges.        PERTINENT HISTORY:  Peripheral neuropathy Incontinence bladder/bowel  Right hand dominant;  scoliosis; osteoporosis; 2018 compression fracture L1 when fell backwards  PAIN:  Are you having pain? Yes NPRS scale: 3/10 Pain location: left shoulder - lateral deltoid and upper trap   Aggravating factors: mornings; elevating left arm; reaching for a skillet in cabinet;  walking 2,000 steps on pedometer;  prolonged standing Relieving factors: sitting, lying OK  PRECAUTIONS: None  WEIGHT BEARING RESTRICTIONS No  FALLS:  Has patient fallen in last 6 months? No  LIVING ENVIRONMENT: Lives with: lives with their family and [facility] [place]   OCCUPATION: retired   PLOF: Independent with basic ADLs  PATIENT GOALS want to feel better.  Help shoulder pain; stand in the kitchen; not have to lie down after a walk   OBJECTIVE:   DIAGNOSTIC FINDINGS:  None recently  PATIENT SURVEYS:  FOTO shoulder 54%   COGNITION:  Overall cognitive status: Within functional limits for tasks assessed      MUSCLE LENGTH: Hamstrings: Right 50 deg; Left 50 deg Thomas test: Right 0 deg; Left 0 deg  POSTURE: rounded shoulders and decreased lumbar lordosis; mild forward  trunk   SHOULDER ROM:  RIGHT:  FLEXION:  140   ABDUCTION:   163  EXTERNAL ROTATION:   85  INTERNAL ROTATION: t8                                  LEFT:    FLEXION: 108     ABDUCTION:     65   EXTERNAL ROTATION:   63    INTERNAL ROTATION: L5 SHOULDER STRENGTH :  left:  Flexion:  4-/5;  ABDUCTION 2+/5, external and internal rotation 4/5 LUMBAR ROM:   Active  A/PROM  eval  Flexion 50  Extension 5  Right lateral flexion 35  Left lateral flexion 35  Right rotation   Left rotation    (Blank rows = not tested)  LOWER EXTREMITY ROM:     TRUNK STRENGTH:  Decreased activation of transverse abdominus muscles; abdominals 4-/5; decreased activation of lumbar multifidi; trunk extensors 4-/5   LOWER EXTREMITY MMT:  Able to kneel down on floor and get back up with UE assist on chair;  able to sit to stand without UE assist; hip abduction 4/5   FUNCTIONAL TESTS:  5 times sit to stand: 17 Timed up and go (TUG): 12  no UE use  GAIT:  Comments: no assistive device;  WFLs    TODAY'S TREATMENT  9/27: Nu-step L 3 x 5 min Supine on wedge for incline - cane used for AAROM shoulder flexion (horizontal and vertical hand placement on cane) 5x each, external rotation 5x, abduction 5x Supine on wedge for incline:  hand to knee push (isometric) 5x same side hand to knee then 5x hand to opposite knee Piriformis stretch in seated position Shoulder isometric standing - flex, abduction, ext push against ball on wall Yellow band rows 10x  Yellow band shoulder extensions 10x Seated thoracic extension with ball and then trial with towel roll 10x Therapeutic activities:  reaching, pushing, pulling     Date: 11/18/21 HEP established-see below  Nu-stp L 1 x 7 min Supine on wedge for incline - cane used  for AAROM shoulder flexion, external rotation, abduction Piriformis stretch in supine Shoulder isometric standing - flex, abduction, ext  PATIENT EDUCATION:  Education details: Access Code:  [email or web address] Person educated: Patient Education method: Explanation, Demonstration, Tactile cues, Verbal cues, and Handouts Education comprehension: verbalized understanding and returned demonstration   HOME EXERCISE PROGRAM: Access Code: [email or web address] URL: [email or web address] Date: 11/20/2021 Prepared by: Ruben Im  Exercises - Supine Shoulder Flexion Extension AAROM with Dowel  - 1 x daily - 7 x weekly - 3 sets - 10 reps - Seated Figure 4 Piriformis Stretch  - 1 x daily - 7 x weekly - 1 sets - 3 reps - 30 hold - Isometric Shoulder Abduction at Wall  - 1 x daily - 7 x weekly - 1 sets - 10 reps - 5 sec hold - Isometric Shoulder Flexion at Wall  - 1 x daily - 7 x weekly - 3 sets - 10 reps - Isometric Shoulder Extension at Wall  - 1 x daily - 7 x weekly - 3 sets - 10 reps - Hooklying Isometric Hip Flexion  - 1 x daily - 7 x weekly - 2 sets - 5 reps - 5 hold - Seated Abdominal Set  - 1 x daily - 7 x weekly - 1 sets - 10 reps - Seated Thoracic Lumbar Extension  - 1 x daily - 7 x weekly - 1 sets - 10 reps ASSESSMENT:  CLINICAL IMPRESSION: The patient states she appreciates "details" with exercises. Discussed extensively pain expectations and we agreed that 3-4/10 pain level during ex's was manageable and we would modify any ex's with pain > 5.  She is fearful of band ex's (from a prior bad experience) although she is receptive to trying in the clinic.  Will practice in the clinic several times before prescribing for home.  Therapist monitoring response to all interventions and modifying treatment accordingly.    OBJECTIVE IMPAIRMENTS decreased activity tolerance, difficulty walking, decreased ROM, decreased strength, impaired perceived functional ability, impaired UE functional use, and pain.   ACTIVITY LIMITATIONS carrying, lifting, standing, bathing, dressing, reach over head, and locomotion level  PARTICIPATION LIMITATIONS: meal prep, cleaning, laundry, and community  activity  PERSONAL FACTORS Age, Time since onset of injury/illness/exacerbation, and 1-2 comorbidities: multiple body regions affected, peripheral neuropathy, scoliosis, osteoporosis  are also affecting patient's functional outcome.   REHAB POTENTIAL: Good  CLINICAL DECISION MAKING: moderately complex EVALUATION COMPLEXITY: moderate   GOALS: Goals reviewed with patient? Yes  SHORT TERM GOALS: Target date: 12/11/2021  The patient will demonstrate knowledge of basic self care strategies and exercises to promote healing   Baseline: Goal status: INITIAL  2.  The patient will report a 30% improvement in back and shoulder pain levels with functional activities including reaching overhead, walking, standing Baseline:  Goal status: INITIAL  3.  The patient will have improved trunk flexor and extensor muscle strength and hip strength to at least 4/5 needed for lifting medium weight objects such as grocery bags  Baseline:  Goal status: INITIAL  4.  The patient will have improved shoulder elevation ROM to at least 122 degrees needed for grooming/dressing purposes as well as reaching high shelves  Baseline:  Goal status: INITIAL   LONG TERM GOALS: Target date: 01/08/2022  The patient will be independent in a safe self progression of a home exercise program to promote further recovery of function   Baseline:  Goal status: INITIAL  2.  The patient will report a 65% improvement in back and shoulder  pain levels with functional activities which are currently difficult  including walking, standing, reaching overhead Baseline:  Goal status: INITIAL  3.  The patient will have improved shoulder elevation ROM to at least 140 degrees needed for grooming/dressing purposes as well as reaching high shelves  Baseline:  Goal status: INITIAL  4.  The patient will have grossly 4/5 strength needed to lift and lower a 1-3# object from a high shelf  Baseline:  Goal status: INITIAL  5.  The patient  will have improved FOTO score to   63%    indicating improved function with less pain  Baseline:  Goal status: INITIAL  6.  5x sit to stand test improved to 15 sec INITIAL     PLAN: PT FREQUENCY: 2x/week  PT DURATION: 8 weeks  PLANNED INTERVENTIONS: Therapeutic exercises, Therapeutic activity, Neuromuscular re-education, Patient/Family education, Self Care, Joint mobilization, Aquatic Therapy, Dry Needling, Electrical stimulation, Spinal mobilization, Cryotherapy, Moist heat, Traction, Ultrasound, Manual therapy, and Re-evaluation.  PLAN FOR NEXT SESSION: continue to progress and f/u on initial HEP; Nu-Step;  supine Broomstick (cane) ex's;  flexion biased/neutral lumbo/pelvic/hip ex's (stenosis-like symptoms); yellow band rows and extensions; deltoid isometrics;  give clear detailed instructions with HEP  Ruben Im, PT 11/20/21 4:26 PM Phone: [phone] Fax: [phone]

## 2021-11-26 ENCOUNTER — Other Ambulatory Visit: Payer: Self-pay | Admitting: Internal Medicine

## 2021-11-26 NOTE — Telephone Encounter (Signed)
Patient is requesting a refill of the following medications: Requested Prescriptions   Pending Prescriptions Disp Refills   LORazepam (ATIVAN) 0.5 MG tablet [Pharmacy Med Name: lorazepam 0.5 mg tablet] 60 tablet 2    Sig: TAKE ONE TABLET BY MOUTH TWICE DAILY AS NEEDED FOR ANXIETY    Date of last refill: 09/09/21  Refill amount: 60/2 refills   Treatment agreement date: 10/30/21

## 2021-11-27 ENCOUNTER — Ambulatory Visit: Payer: Medicare Other | Attending: Internal Medicine | Admitting: Physical Therapy

## 2021-11-27 DIAGNOSIS — M25512 Pain in left shoulder: Secondary | ICD-10-CM | POA: Insufficient documentation

## 2021-11-27 DIAGNOSIS — R279 Unspecified lack of coordination: Secondary | ICD-10-CM | POA: Insufficient documentation

## 2021-11-27 DIAGNOSIS — M5459 Other low back pain: Secondary | ICD-10-CM | POA: Insufficient documentation

## 2021-11-27 DIAGNOSIS — M25612 Stiffness of left shoulder, not elsewhere classified: Secondary | ICD-10-CM | POA: Diagnosis not present

## 2021-11-27 DIAGNOSIS — M6281 Muscle weakness (generalized): Secondary | ICD-10-CM | POA: Diagnosis not present

## 2021-11-27 NOTE — Therapy (Signed)
OUTPATIENT PHYSICAL THERAPY THORACOLUMBAR/SHOULDER PROGRESS NOTE   Patient Name: Savannah Diaz MRN: [id number] DOB:1932/07/25, 88 y.o., female Today's Date: 11/27/2021   PT End of Session - 11/27/21 1358     Visit Number 4    Date for PT Re-Evaluation 01/08/22    Authorization Type Medicare    Progress Note Due on Visit 10    PT Start Time 1400    PT Stop Time 1440    PT Time Calculation (min) 40 min    Activity Tolerance Patient tolerated treatment well              Past Medical History:  Diagnosis Date   Anxiety    Arthritis    "qwhere; but it doesn't hurt" (05/12/2012)   Aspiration pneumonia ([place]) 03/21/2016   Cataract    left   Compression fracture of first lumbar vertebra ([place]) 07/2016   superior endplate   Depression    Dysphagia    "have had it for 7 yr; got worse 4 days ago" (05/12/2012)   Dysphagia, pharyngoesophageal phase - suspected 05/10/2012   GERD (gastroesophageal reflux disease)    Hypertension    Insomnia    Lumbago with sciatica, right side    Migraines    "menopause cured them" (05/12/2012)   Osteoporosis    Stage 3 chronic kidney disease ([place])    Uterine prolapse    Past Surgical History:  Procedure Laterality Date   CATARACT EXTRACTION W/ INTRAOCULAR LENS IMPLANT Left ? 2010   ESOPHAGEAL MANOMETRY N/A 11/08/2018   Procedure: ESOPHAGEAL MANOMETRY (EM);  Surgeon: Gatha Mayer, MD;  Location: [facility];  Service: Endoscopy;  Laterality: N/A;   ESOPHAGOGASTRODUODENOSCOPY N/A 05/10/2012   Procedure: ESOPHAGOGASTRODUODENOSCOPY (EGD);  Surgeon: Jerene Bears, MD;  Location: [facility];  Service: Gastroenterology;  Laterality: N/A;   TONSILLECTOMY  1940's   Patient Active Problem List   Diagnosis Date Noted   High risk medication use 08/08/2016   Influenza A 03/22/2016   Fall 03/21/2016   Acute on chronic kidney failure II-III 03/21/2016   Transient alteration of awareness 05/10/2015   Hypokalemia 05/12/2012   Dysphagia oropharyngeal  and pharyngeal 05/10/2012   Hypertension 04/28/2011   Osteoarthritis 04/28/2011   Agoraphobia 04/28/2011    PCP: Veleta Miners MD  REFERRING PROVIDER: Veleta Miners MD  REFERRING DIAG: M54,50, G89.29 Chronic bil low back pain without sciatica; left shoulder pain  Rationale for Evaluation and Treatment Rehabilitation  THERAPY DIAG:  Back pain; weakness  ONSET DATE: 09/12/2021  SUBJECTIVE:  SUBJECTIVE STATEMENT: I got a ball for the isometrics and I like that.  My biggest improvement is that I'm going back further.  I'm devoted to doing my physical therapy.       PERTINENT HISTORY:  Peripheral neuropathy Incontinence bladder/bowel  Right hand dominant;  scoliosis; osteoporosis; 2018 compression fracture L1 when fell backwards  PAIN:  Are you having pain? Yes NPRS scale: 2/10 Pain location: left shoulder - lateral deltoid and upper trap   Aggravating factors: mornings; elevating left arm; reaching for a skillet in cabinet;  walking 2,000 steps on pedometer;  prolonged standing Relieving factors: sitting, lying OK  PRECAUTIONS: None  WEIGHT BEARING RESTRICTIONS No  FALLS:  Has patient fallen in last 6 months? No  LIVING ENVIRONMENT: Lives with: lives with their family and [facility] [place]   OCCUPATION: retired   PLOF: Independent with basic ADLs  PATIENT GOALS want to feel better.  Help shoulder pain; stand in the kitchen; not have to lie down after a walk   OBJECTIVE:   DIAGNOSTIC FINDINGS:  None recently  PATIENT SURVEYS:  FOTO shoulder 54%   COGNITION:  Overall cognitive status: Within functional limits for tasks assessed      MUSCLE LENGTH: Hamstrings: Right 50 deg; Left 50 deg Thomas test: Right 0 deg; Left 0 deg  POSTURE: rounded shoulders and decreased lumbar  lordosis; mild forward trunk   SHOULDER ROM:  RIGHT:  FLEXION:  140   ABDUCTION:   163  EXTERNAL ROTATION:   85  INTERNAL ROTATION: t8                                  LEFT:    FLEXION: 108     ABDUCTION:     65   EXTERNAL ROTATION:   63    INTERNAL ROTATION: L5  10/4:  115 with golf club; supine 112;  seated flexion 125 degrees:  abduction 93;  external rotation 62; internal rotation L1   SHOULDER STRENGTH :  left:  Flexion:  4-/5;  ABDUCTION 2+/5, external and internal rotation 4/5 LUMBAR ROM:   Active  A/PROM  eval 10/4  Flexion 50 50  Extension 5 8  Right lateral flexion 35 35  Left lateral flexion 35 35  Right rotation    Left rotation     (Blank rows = not tested)  LOWER EXTREMITY ROM:     TRUNK STRENGTH:  Decreased activation of transverse abdominus muscles; abdominals 4-/5; decreased activation of lumbar multifidi; trunk extensors 4-/5   LOWER EXTREMITY MMT:  Able to kneel down on floor and get back up with UE assist on chair;  able to sit to stand without UE assist; hip abduction 4/5   FUNCTIONAL TESTS:  5 times sit to stand: 17 Timed up and go (TUG): 12  no UE use  GAIT:  Comments: no assistive device;  WFLs    TODAY'S TREATMENT  10/4: Nu-step L 1 x 5 min old model  Supine flat:  golf club  used for AAROM shoulder flexion (horizontal and vertical hand placement on cane) 5x each, external rotation 5x, abduction 5x Supine hand to knee push (isometric) 5x hand to opposite knee;  bent knee lifts and lowers 5x Sidelying clams 10x right/left Sidelying shoulder abduction 12:00/6:00 and 9:00/3:00 small movements 3 sets of 5 Piriformis stretch in seated position Shoulder isometric standing - flex, abduction, ext push against ball on wall Yellow band rows  10x  Yellow band shoulder extensions 10x Seated thoracic extension with ball 10x Therapeutic activities:  reaching, pushing, pulling       9/27: Nu-step L 3 x 5 min Supine on wedge for incline - cane used  for AAROM shoulder flexion (horizontal and vertical hand placement on cane) 5x each, external rotation 5x, abduction 5x Supine on wedge for incline:  hand to knee push (isometric) 5x same side hand to knee then 5x hand to opposite knee Piriformis stretch in seated position Shoulder isometric standing - flex, abduction, ext push against ball on wall Yellow band rows 10x  Yellow band shoulder extensions 10x Seated thoracic extension with ball and then trial with towel roll 10x Therapeutic activities:  reaching, pushing, pulling     Date: 11/18/21 HEP established-see below  Nu-stp L 1 x 7 min Supine on wedge for incline - cane used for AAROM shoulder flexion, external rotation, abduction Piriformis stretch in supine Shoulder isometric standing - flex, abduction, ext  PATIENT EDUCATION:  Education details: Access Code: [email or web address] Person educated: Patient Education method: Explanation, Demonstration, Tactile cues, Verbal cues, and Handouts Education comprehension: verbalized understanding and returned demonstration   HOME EXERCISE PROGRAM: Access Code: [email or web address] URL: [email or web address] Date: 11/27/2021 Prepared by: Ruben Im  Exercises - Supine Shoulder Flexion Extension AAROM with Dowel  - 1 x daily - 7 x weekly - 3 sets - 10 reps - Seated Figure 4 Piriformis Stretch  - 1 x daily - 7 x weekly - 1 sets - 3 reps - 30 hold - Isometric Shoulder Abduction at Wall  - 1 x daily - 7 x weekly - 1 sets - 10 reps - 5 sec hold - Isometric Shoulder Flexion at Wall  - 1 x daily - 7 x weekly - 3 sets - 10 reps - Isometric Shoulder Extension at Wall  - 1 x daily - 7 x weekly - 3 sets - 10 reps - Hooklying Isometric Hip Flexion  - 1 x daily - 7 x weekly - 2 sets - 5 reps - 5 hold - Seated Abdominal Set  - 1 x daily - 7 x weekly - 1 sets - 10 reps - Seated Thoracic Lumbar Extension  - 1 x daily - 7 x weekly - 1 sets - 10 reps - Hooklying Bent Leg Lifts  - 1 x daily - 7 x weekly - 1  sets - 10 reps - Clamshell  - 1 x daily - 7 x weekly - 1 sets - 10 reps - Sidelying Shoulder Abduction Palm Forward (Mirrored)  - 1 x daily - 7 x weekly - 3 sets - 5 reps ASSESSMENT:  CLINICAL IMPRESSION: Significant improvements in shoulder flexion, abduction and internal rotation ROM compared to initial evaluation.  Thorough review with all HEP to ensure good technique, provide cues for optimal technique and to help promote confidence in ex performance. She responds well to clear and detailed instructions.  She reports less fear with band ex's today (reports a negative experience in the past).   Will continue with left shoulder and back rehab (neutral and flexion bias).    OBJECTIVE IMPAIRMENTS decreased activity tolerance, difficulty walking, decreased ROM, decreased strength, impaired perceived functional ability, impaired UE functional use, and pain.   ACTIVITY LIMITATIONS carrying, lifting, standing, bathing, dressing, reach over head, and locomotion level  PARTICIPATION LIMITATIONS: meal prep, cleaning, laundry, and community activity  PERSONAL FACTORS Age, Time since onset of injury/illness/exacerbation, and 1-2 comorbidities: multiple body regions affected, peripheral neuropathy, scoliosis, osteoporosis  are also affecting patient's functional outcome.   REHAB POTENTIAL: Good  CLINICAL DECISION MAKING: moderately complex EVALUATION COMPLEXITY: moderate   GOALS: Goals reviewed with patient? Yes  SHORT TERM GOALS: Target date: 12/11/2021  The patient will demonstrate knowledge of basic self care strategies and exercises to promote healing   Baseline: Goal status: INITIAL  2.  The patient will report a 30% improvement in back and shoulder pain levels with functional activities including reaching overhead, walking, standing Baseline:  Goal status: INITIAL  3.  The patient will have improved trunk flexor and extensor muscle strength and hip strength to at least 4/5 needed for  lifting medium weight objects such as grocery bags  Baseline:  Goal status: INITIAL  4.  The patient will have improved shoulder elevation ROM to at least 122 degrees needed for grooming/dressing purposes as well as reaching high shelves  Baseline:  Goal status: INITIAL   LONG TERM GOALS: Target date: 01/08/2022  The patient will be independent in a safe self progression of a home exercise program to promote further recovery of function   Baseline:  Goal status: INITIAL  2.  The patient will report a 65% improvement in back and shoulder pain levels with functional activities which are currently difficult  including walking, standing, reaching overhead Baseline:  Goal status: INITIAL  3.  The patient will have improved shoulder elevation ROM to at least 140 degrees needed for grooming/dressing purposes as well as reaching high shelves  Baseline:  Goal status: INITIAL  4.  The patient will have grossly 4/5 strength needed to lift and lower a 1-3# object from a high shelf  Baseline:  Goal status: INITIAL  5.  The patient will have improved FOTO score to   63%    indicating improved function with less pain  Baseline:  Goal status: INITIAL  6.  5x sit to stand test improved to 15 sec INITIAL     PLAN: PT FREQUENCY: 2x/week  PT DURATION: 8 weeks  PLANNED INTERVENTIONS: Therapeutic exercises, Therapeutic activity, Neuromuscular re-education, Patient/Family education, Self Care, Joint mobilization, Aquatic Therapy, Dry Needling, Electrical stimulation, Spinal mobilization, Cryotherapy, Moist heat, Traction, Ultrasound, Manual therapy, and Re-evaluation.  PLAN FOR NEXT SESSION: continue to progress and review HEP; Nu-Step;  supine Broomstick (cane) ex's;  flexion biased/neutral lumbo/pelvic/hip ex's (stenosis-like symptoms); yellow band rows and extensions (pt fearful of band-not given for home yet); deltoid isometrics;  give clear detailed instructions with HEP  Ruben Im,  PT 11/27/21 4:17 PM Phone: [phone] Fax: [phone]

## 2021-12-02 ENCOUNTER — Encounter: Payer: Self-pay | Admitting: Physical Therapy

## 2021-12-02 ENCOUNTER — Ambulatory Visit: Payer: Medicare Other | Admitting: Physical Therapy

## 2021-12-02 DIAGNOSIS — R279 Unspecified lack of coordination: Secondary | ICD-10-CM | POA: Diagnosis not present

## 2021-12-02 DIAGNOSIS — M25512 Pain in left shoulder: Secondary | ICD-10-CM

## 2021-12-02 DIAGNOSIS — M25612 Stiffness of left shoulder, not elsewhere classified: Secondary | ICD-10-CM | POA: Diagnosis not present

## 2021-12-02 DIAGNOSIS — M6281 Muscle weakness (generalized): Secondary | ICD-10-CM

## 2021-12-02 DIAGNOSIS — M5459 Other low back pain: Secondary | ICD-10-CM | POA: Diagnosis not present

## 2021-12-02 NOTE — Therapy (Signed)
OUTPATIENT PHYSICAL THERAPY THORACOLUMBAR/SHOULDER PROGRESS NOTE   Patient Name: Savannah Diaz MRN: [id number] DOB:28-Sep-1932, 88 y.o., female Today's Date: 12/02/2021   PT End of Session - 12/02/21 1448     Visit Number 5    Date for PT Re-Evaluation 01/08/22    Authorization Type Medicare    Progress Note Due on Visit 10    PT Start Time 1449    PT Stop Time 1531    PT Time Calculation (min) 42 min    Activity Tolerance Patient tolerated treatment well    Behavior During Therapy [facility] for tasks assessed/performed               Past Medical History:  Diagnosis Date   Anxiety    Arthritis    "qwhere; but it doesn't hurt" (05/12/2012)   Aspiration pneumonia ([place]) 03/21/2016   Cataract    left   Compression fracture of first lumbar vertebra ([place]) 07/2016   superior endplate   Depression    Dysphagia    "have had it for 7 yr; got worse 4 days ago" (05/12/2012)   Dysphagia, pharyngoesophageal phase - suspected 05/10/2012   GERD (gastroesophageal reflux disease)    Hypertension    Insomnia    Lumbago with sciatica, right side    Migraines    "menopause cured them" (05/12/2012)   Osteoporosis    Stage 3 chronic kidney disease ([place])    Uterine prolapse    Past Surgical History:  Procedure Laterality Date   CATARACT EXTRACTION W/ INTRAOCULAR LENS IMPLANT Left ? 2010   ESOPHAGEAL MANOMETRY N/A 11/08/2018   Procedure: ESOPHAGEAL MANOMETRY (EM);  Surgeon: Gatha Mayer, MD;  Location: [facility];  Service: Endoscopy;  Laterality: N/A;   ESOPHAGOGASTRODUODENOSCOPY N/A 05/10/2012   Procedure: ESOPHAGOGASTRODUODENOSCOPY (EGD);  Surgeon: Jerene Bears, MD;  Location: [facility];  Service: Gastroenterology;  Laterality: N/A;   TONSILLECTOMY  1940's   Patient Active Problem List   Diagnosis Date Noted   High risk medication use 08/08/2016   Influenza A 03/22/2016   Fall 03/21/2016   Acute on chronic kidney failure II-III 03/21/2016   Transient alteration of awareness  05/10/2015   Hypokalemia 05/12/2012   Dysphagia oropharyngeal and pharyngeal 05/10/2012   Hypertension 04/28/2011   Osteoarthritis 04/28/2011   Agoraphobia 04/28/2011    PCP: Veleta Miners MD  REFERRING PROVIDER: Veleta Miners MD  REFERRING DIAG: M54,50, G89.29 Chronic bil low back pain without sciatica; left shoulder pain  Rationale for Evaluation and Treatment Rehabilitation  THERAPY DIAG:  Back pain; weakness  ONSET DATE: 09/12/2021  SUBJECTIVE:  SUBJECTIVE STATEMENT: My shoulder is bothering me but it is like usual.     PERTINENT HISTORY:  Peripheral neuropathy Incontinence bladder/bowel  Right hand dominant;  scoliosis; osteoporosis; 2018 compression fracture L1 when fell backwards  PAIN:  Are you having pain? Yes NPRS scale: 2/10 Pain location: left shoulder - lateral deltoid and upper trap   Aggravating factors: mornings; elevating left arm; reaching for a skillet in cabinet;  walking 2,000 steps on pedometer;  prolonged standing Relieving factors: sitting, lying OK  PRECAUTIONS: None  WEIGHT BEARING RESTRICTIONS No  FALLS:  Has patient fallen in last 6 months? No  LIVING ENVIRONMENT: Lives with: lives with their family and [facility] Home [place]   OCCUPATION: retired   PLOF: Independent with basic ADLs  PATIENT GOALS want to feel better.  Help shoulder pain; stand in the kitchen; not have to lie down after a walk   OBJECTIVE:   DIAGNOSTIC FINDINGS:  None recently  PATIENT SURVEYS:  FOTO shoulder 54%   COGNITION:  Overall cognitive status: Within functional limits for tasks assessed      MUSCLE LENGTH: Hamstrings: Right 50 deg; Left 50 deg Thomas test: Right 0 deg; Left 0 deg  POSTURE: rounded shoulders and decreased lumbar lordosis; mild forward  trunk   SHOULDER ROM:  RIGHT:  FLEXION:  140   ABDUCTION:   163  EXTERNAL ROTATION:   85  INTERNAL ROTATION: t8                                  LEFT:    FLEXION: 108     ABDUCTION:     65   EXTERNAL ROTATION:   63    INTERNAL ROTATION: L5  10/4:  115 with golf club; supine 112;  seated flexion 125 degrees:  abduction 93;  external rotation 62; internal rotation L1   SHOULDER STRENGTH :  left:  Flexion:  4-/5;  ABDUCTION 2+/5, external and internal rotation 4/5 LUMBAR ROM:   Active  A/PROM  eval 10/4  Flexion 50 50  Extension 5 8  Right lateral flexion 35 35  Left lateral flexion 35 35  Right rotation    Left rotation     (Blank rows = not tested)  LOWER EXTREMITY ROM:     TRUNK STRENGTH:  Decreased activation of transverse abdominus muscles; abdominals 4-/5; decreased activation of lumbar multifidi; trunk extensors 4-/5   LOWER EXTREMITY MMT:  Able to kneel down on floor and get back up with UE assist on chair;  able to sit to stand without UE assist; hip abduction 4/5   FUNCTIONAL TESTS:  5 times sit to stand: 17 Timed up and go (TUG): 12  no UE use  GAIT:  Comments: no assistive device;  WFLs    TODAY'S TREATMENT   12/02/21: Nu-step L 1 x 5 min old model. PTA present Supine flat:  golf club  used for AAROM shoulder flexion (horizontal and vertical hand placement on cane) 10x each, external rotation 10x, abduction 10x Sidelying shoulder abduction 12:00/6:00 and 9:00/3:00 small movements x 5 Supine clamshells with green loop 2x5 Supine piriformis stretch 20 sec each side  Seated thoracic extension with ball 10x 10/4: Nu-step L 1 x 5 min old model  Supine flat:  golf club  used for AAROM shoulder flexion (horizontal and vertical hand placement on cane) 5x each, external rotation 5x, abduction 5x Supine hand to knee push (isometric) 5x hand  to opposite knee;  bent knee lifts and lowers 10x Bil Sidelying clams 10x right/left Sidelying shoulder abduction 12:00/6:00 and  9:00/3:00 small movements x 5 Piriformis stretch in seated position Shoulder isometric standing - flex, abduction, ext push against ball on wall Yellow band rows 10x  Yellow band shoulder extensions 10x Seated thoracic extension with ball 10x Therapeutic activities:  reaching, pushing, pulling       9/27: Nu-step L 3 x 5 min Supine on wedge for incline - cane used for AAROM shoulder flexion (horizontal and vertical hand placement on cane) 5x each, external rotation 5x, abduction 5x Supine on wedge for incline:  hand to knee push (isometric) 5x same side hand to knee then 5x hand to opposite knee Piriformis stretch in seated position Shoulder isometric standing - flex, abduction, ext push against ball on wall Yellow band rows 10x  Yellow band shoulder extensions 10x Seated thoracic extension with ball and then trial with towel roll 10x Therapeutic activities:  reaching, pushing, pulling     Date: 11/18/21 HEP established-see below  Nu-stp L 1 x 7 min Supine on wedge for incline - cane used for AAROM shoulder flexion, external rotation, abduction Piriformis stretch in supine Shoulder isometric standing - flex, abduction, ext  PATIENT EDUCATION:  Education details: Access Code: [email or web address] Person educated: Patient Education method: Explanation, Demonstration, Tactile cues, Verbal cues, and Handouts Education comprehension: verbalized understanding and returned demonstration   HOME EXERCISE PROGRAM: Access Code: [email or web address] URL: [email or web address] Date: 11/27/2021 Prepared by: Ruben Im  Exercises - Supine Shoulder Flexion Extension AAROM with Dowel  - 1 x daily - 7 x weekly - 3 sets - 10 reps - Seated Figure 4 Piriformis Stretch  - 1 x daily - 7 x weekly - 1 sets - 3 reps - 30 hold - Isometric Shoulder Abduction at Wall  - 1 x daily - 7 x weekly - 1 sets - 10 reps - 5 sec hold - Isometric Shoulder Flexion at Wall  - 1 x daily - 7 x weekly - 3 sets - 10  reps - Isometric Shoulder Extension at Wall  - 1 x daily - 7 x weekly - 3 sets - 10 reps - Hooklying Isometric Hip Flexion  - 1 x daily - 7 x weekly - 2 sets - 5 reps - 5 hold - Seated Abdominal Set  - 1 x daily - 7 x weekly - 1 sets - 10 reps - Seated Thoracic Lumbar Extension  - 1 x daily - 7 x weekly - 1 sets - 10 reps - Hooklying Bent Leg Lifts  - 1 x daily - 7 x weekly - 1 sets - 10 reps - Clamshell  - 1 x daily - 7 x weekly - 1 sets - 10 reps - Sidelying Shoulder Abduction Palm Forward (Mirrored)  - 1 x daily - 7 x weekly - 3 sets - 5 reps ASSESSMENT:  CLINICAL IMPRESSION: Pt arrives with mild Lt shoulder pain.   OBJECTIVE IMPAIRMENTS decreased activity tolerance, difficulty walking, decreased ROM, decreased strength, impaired perceived functional ability, impaired UE functional use, and pain.   ACTIVITY LIMITATIONS carrying, lifting, standing, bathing, dressing, reach over head, and locomotion level  PARTICIPATION LIMITATIONS: meal prep, cleaning, laundry, and community activity  PERSONAL FACTORS Age, Time since onset of injury/illness/exacerbation, and 1-2 comorbidities: multiple body regions affected, peripheral neuropathy, scoliosis, osteoporosis  are also affecting patient's functional outcome.   REHAB POTENTIAL: Good  CLINICAL DECISION MAKING: moderately complex EVALUATION COMPLEXITY: moderate   GOALS:  Goals reviewed with patient? Yes  SHORT TERM GOALS: Target date: 12/11/2021  The patient will demonstrate knowledge of basic self care strategies and exercises to promote healing   Baseline: Goal status: INITIAL  2.  The patient will report a 30% improvement in back and shoulder pain levels with functional activities including reaching overhead, walking, standing Baseline:  Goal status: INITIAL  3.  The patient will have improved trunk flexor and extensor muscle strength and hip strength to at least 4/5 needed for lifting medium weight objects such as grocery bags   Baseline:  Goal status: INITIAL  4.  The patient will have improved shoulder elevation ROM to at least 122 degrees needed for grooming/dressing purposes as well as reaching high shelves  Baseline:  Goal status: INITIAL   LONG TERM GOALS: Target date: 01/08/2022  The patient will be independent in a safe self progression of a home exercise program to promote further recovery of function   Baseline:  Goal status: INITIAL  2.  The patient will report a 65% improvement in back and shoulder pain levels with functional activities which are currently difficult  including walking, standing, reaching overhead Baseline:  Goal status: INITIAL  3.  The patient will have improved shoulder elevation ROM to at least 140 degrees needed for grooming/dressing purposes as well as reaching high shelves  Baseline:  Goal status: INITIAL  4.  The patient will have grossly 4/5 strength needed to lift and lower a 1-3# object from a high shelf  Baseline:  Goal status: INITIAL  5.  The patient will have improved FOTO score to   63%    indicating improved function with less pain  Baseline:  Goal status: INITIAL  6.  5x sit to stand test improved to 15 sec INITIAL     PLAN: PT FREQUENCY: 2x/week  PT DURATION: 8 weeks  PLANNED INTERVENTIONS: Therapeutic exercises, Therapeutic activity, Neuromuscular re-education, Patient/Family education, Self Care, Joint mobilization, Aquatic Therapy, Dry Needling, Electrical stimulation, Spinal mobilization, Cryotherapy, Moist heat, Traction, Ultrasound, Manual therapy, and Re-evaluation.  PLAN FOR NEXT SESSION: continue to progress and review HEP; Nu-Step;  supine Broomstick (cane) ex's;  flexion biased/neutral lumbo/pelvic/hip ex's (stenosis-like symptoms); yellow band rows and extensions (pt fearful of band-not given for home yet); deltoid isometrics;  give clear detailed instructions with HEP  Myrene Galas, PTA 12/02/21 3:31 PM

## 2021-12-04 ENCOUNTER — Ambulatory Visit: Payer: Medicare Other | Admitting: Physical Therapy

## 2021-12-04 DIAGNOSIS — R279 Unspecified lack of coordination: Secondary | ICD-10-CM | POA: Diagnosis not present

## 2021-12-04 DIAGNOSIS — M6281 Muscle weakness (generalized): Secondary | ICD-10-CM

## 2021-12-04 DIAGNOSIS — M25612 Stiffness of left shoulder, not elsewhere classified: Secondary | ICD-10-CM | POA: Diagnosis not present

## 2021-12-04 DIAGNOSIS — M5459 Other low back pain: Secondary | ICD-10-CM

## 2021-12-04 DIAGNOSIS — M25512 Pain in left shoulder: Secondary | ICD-10-CM

## 2021-12-04 NOTE — Therapy (Signed)
OUTPATIENT PHYSICAL THERAPY THORACOLUMBAR/SHOULDER PROGRESS NOTE   Patient Name: Savannah Diaz MRN: [id number] DOB:1932-07-13, 88 y.o., female Today's Date: 12/04/2021   PT End of Session - 12/04/21 1358     Visit Number 6    Date for PT Re-Evaluation 01/08/22    Authorization Type Medicare    Progress Note Due on Visit 10    PT Start Time 1400    PT Stop Time 1440    PT Time Calculation (min) 40 min    Activity Tolerance Patient tolerated treatment well               Past Medical History:  Diagnosis Date   Anxiety    Arthritis    "qwhere; but it doesn't hurt" (05/12/2012)   Aspiration pneumonia ([place]) 03/21/2016   Cataract    left   Compression fracture of first lumbar vertebra ([place]) 07/2016   superior endplate   Depression    Dysphagia    "have had it for 7 yr; got worse 4 days ago" (05/12/2012)   Dysphagia, pharyngoesophageal phase - suspected 05/10/2012   GERD (gastroesophageal reflux disease)    Hypertension    Insomnia    Lumbago with sciatica, right side    Migraines    "menopause cured them" (05/12/2012)   Osteoporosis    Stage 3 chronic kidney disease ([place])    Uterine prolapse    Past Surgical History:  Procedure Laterality Date   CATARACT EXTRACTION W/ INTRAOCULAR LENS IMPLANT Left ? 2010   ESOPHAGEAL MANOMETRY N/A 11/08/2018   Procedure: ESOPHAGEAL MANOMETRY (EM);  Surgeon: Gatha Mayer, MD;  Location: [facility];  Service: Endoscopy;  Laterality: N/A;   ESOPHAGOGASTRODUODENOSCOPY N/A 05/10/2012   Procedure: ESOPHAGOGASTRODUODENOSCOPY (EGD);  Surgeon: Jerene Bears, MD;  Location: [facility];  Service: Gastroenterology;  Laterality: N/A;   TONSILLECTOMY  1940's   Patient Active Problem List   Diagnosis Date Noted   High risk medication use 08/08/2016   Influenza A 03/22/2016   Fall 03/21/2016   Acute on chronic kidney failure II-III 03/21/2016   Transient alteration of awareness 05/10/2015   Hypokalemia 05/12/2012   Dysphagia  oropharyngeal and pharyngeal 05/10/2012   Hypertension 04/28/2011   Osteoarthritis 04/28/2011   Agoraphobia 04/28/2011    PCP: Veleta Miners MD  REFERRING PROVIDER: Veleta Miners MD  REFERRING DIAG: M54,50, G89.29 Chronic bil low back pain without sciatica; left shoulder pain  Rationale for Evaluation and Treatment Rehabilitation  THERAPY DIAG:  Back pain; weakness  ONSET DATE: 09/12/2021  SUBJECTIVE:  SUBJECTIVE STATEMENT: My shoulder is about the same.   I'm wearing a (back) belt today.      PERTINENT HISTORY:  Peripheral neuropathy Incontinence bladder/bowel  Right hand dominant;  scoliosis; osteoporosis; 2018 compression fracture L1 when fell backwards  PAIN:  Are you having pain? Yes NPRS scale: 2-3/10 Pain location: left shoulder - lateral deltoid and upper trap   Aggravating factors: mornings; elevating left arm; reaching for a skillet in cabinet;  walking 2,000 steps on pedometer;  prolonged standing Relieving factors: sitting, lying OK  PRECAUTIONS: None  WEIGHT BEARING RESTRICTIONS No  FALLS:  Has patient fallen in last 6 months? No  LIVING ENVIRONMENT: Lives with: lives with their family and Friends Home [place]   OCCUPATION: retired   PLOF: Independent with basic ADLs  PATIENT GOALS want to feel better.  Help shoulder pain; stand in the kitchen; not have to lie down after a walk   OBJECTIVE:   DIAGNOSTIC FINDINGS:  None recently  PATIENT SURVEYS:  FOTO shoulder 54%   COGNITION:  Overall cognitive status: Within functional limits for tasks assessed      MUSCLE LENGTH: Hamstrings: Right 50 deg; Left 50 deg Thomas test: Right 0 deg; Left 0 deg  POSTURE: rounded shoulders and decreased lumbar lordosis; mild forward trunk   SHOULDER ROM:  RIGHT:  FLEXION:   140   ABDUCTION:   163  EXTERNAL ROTATION:   85  INTERNAL ROTATION: t8                                  LEFT:    FLEXION: 108     ABDUCTION:     65   EXTERNAL ROTATION:   63    INTERNAL ROTATION: L5  10/4:  115 with golf club; supine 112;  seated flexion 125 degrees:  abduction 93;  external rotation 62; internal rotation L1   SHOULDER STRENGTH :  left:  Flexion:  4-/5;  ABDUCTION 2+/5, external and internal rotation 4/5 LUMBAR ROM:   Active  A/PROM  eval 10/4  Flexion 50 50  Extension 5 8  Right lateral flexion 35 35  Left lateral flexion 35 35  Right rotation    Left rotation     (Blank rows = not tested)  LOWER EXTREMITY ROM:     TRUNK STRENGTH:  Decreased activation of transverse abdominus muscles; abdominals 4-/5; decreased activation of lumbar multifidi; trunk extensors 4-/5   LOWER EXTREMITY MMT:  Able to kneel down on floor and get back up with UE assist on chair;  able to sit to stand without UE assist; hip abduction 4/5   FUNCTIONAL TESTS:  5 times sit to stand: 17 Timed up and go (TUG): 12  no UE use  GAIT:  Comments: no assistive device;  WFLs    TODAY'S TREATMENT  10/11: Nu-step L 1 x 7 min old model while discussing status Supine flat: flexion 90 degrees  12:00/6:00 and 9:00/3:00 small movements x 10 Supine serratus punch 10x Sidelying shoulder abduction 12:00/6:00 and 9:00/3:00 small movements x 10 Sidelying shoulder external rotation 10x Supine beach ball on thighs UE pushes to activate lower abdominals 5x Supine beach ball on thighs with UE and LEs lift offs 5x Seated cross body horizontal adduction stretch 2x  Standing in the door horizontal adduction stretch 2x StandingYellow band single arm rows 2x10 Standing Yellow band single arm extensions 10x Patient given yellow band for home use, anchored  with knot/closed door Therapeutic activities:  reaching, pushing, pulling      12/02/21: Nu-step L 1 x 5 min old model. PTA present Supine flat:  golf  club  used for AAROM shoulder flexion (horizontal and vertical hand placement on cane) 10x each, external rotation 10x, abduction 10x Sidelying shoulder abduction 12:00/6:00 and 9:00/3:00 small movements x 5 Supine clamshells with green loop 2x5 Supine piriformis stretch 20 sec each side  Seated thoracic extension with ball 10x 10/4: Nu-step L 1 x 5 min old model  Supine flat:  golf club  used for AAROM shoulder flexion (horizontal and vertical hand placement on cane) 5x each, external rotation 5x, abduction 5x Supine hand to knee push (isometric) 5x hand to opposite knee;  bent knee lifts and lowers 10x Bil Sidelying clams 10x right/left Sidelying shoulder abduction 12:00/6:00 and 9:00/3:00 small movements x 5 Piriformis stretch in seated position Shoulder isometric standing - flex, abduction, ext push against ball on wall Yellow band rows 10x  Yellow band shoulder extensions 10x Seated thoracic extension with ball 10x Therapeutic activities:  reaching, pushing, pulling     Date: 11/18/21 HEP established-see below  Nu-stp L 1 x 7 min Supine on wedge for incline - cane used for AAROM shoulder flexion, external rotation, abduction Piriformis stretch in supine Shoulder isometric standing - flex, abduction, ext  PATIENT EDUCATION:  Education details: Access Code: [email or web address] Person educated: Patient Education method: Explanation, Demonstration, Tactile cues, Verbal cues, and Handouts Education comprehension: verbalized understanding and returned demonstration   HOME EXERCISE PROGRAM: Access Code: [email or web address] URL: [email or web address] Date: 12/04/2021 Prepared by: Ruben Im  Exercises - Supine Shoulder Flexion Extension AAROM with Dowel  - 1 x daily - 7 x weekly - 3 sets - 10 reps - Seated Figure 4 Piriformis Stretch  - 1 x daily - 7 x weekly - 1 sets - 3 reps - 30 hold - Isometric Shoulder Abduction at Wall  - 1 x daily - 7 x weekly - 1 sets - 10 reps - 5 sec  hold - Isometric Shoulder Flexion at Wall  - 1 x daily - 7 x weekly - 3 sets - 10 reps - Isometric Shoulder Extension at Wall  - 1 x daily - 7 x weekly - 3 sets - 10 reps - Hooklying Isometric Hip Flexion  - 1 x daily - 7 x weekly - 2 sets - 5 reps - 5 hold - Seated Abdominal Set  - 1 x daily - 7 x weekly - 1 sets - 10 reps - Seated Thoracic Lumbar Extension  - 1 x daily - 7 x weekly - 1 sets - 10 reps - Hooklying Bent Leg Lifts  - 1 x daily - 7 x weekly - 1 sets - 10 reps - Clamshell  - 1 x daily - 7 x weekly - 1 sets - 10 reps - Sidelying Shoulder Abduction Palm Forward (Mirrored)  - 1 x daily - 7 x weekly - 3 sets - 5 reps - Supine Transversus Abdominis Bracing - Hands on Thighs  - 1 x daily - 7 x weekly - 1 sets - 10 reps - serratus punch  - 1 x daily - 7 x weekly - 1 sets - 10 reps - Sidelying Shoulder External Rotation  - 1 x daily - 7 x weekly - 1 sets - 10 reps - Standing Single Arm Row with Resistance Thumb Up (Mirrored)  - 1 x daily - 7 x weekly - 1 sets - 10 reps -  Single Arm Shoulder Extension with Anchored Resistance (Mirrored)  - 1 x daily - 7 x weekly - 1 sets - 10 reps Access Code: [email or web address] ASSESSMENT:  CLINICAL IMPRESSION: Discussed finding the right balance of exercise and selecting 4-5 ex's at a time.  The patient requests many of the ex's be added to her HEP but we discussed that the expectation is not to do all of the ex's at once but offer variety.  She is receptive to trying the band ex's at home and she was given a handled band to use.  May need review next visit.  Therapist monitoring response and modifying treatment as needed.    OBJECTIVE IMPAIRMENTS decreased activity tolerance, difficulty walking, decreased ROM, decreased strength, impaired perceived functional ability, impaired UE functional use, and pain.   ACTIVITY LIMITATIONS carrying, lifting, standing, bathing, dressing, reach over head, and locomotion level  PARTICIPATION LIMITATIONS: meal prep, cleaning,  laundry, and community activity  PERSONAL FACTORS Age, Time since onset of injury/illness/exacerbation, and 1-2 comorbidities: multiple body regions affected, peripheral neuropathy, scoliosis, osteoporosis  are also affecting patient's functional outcome.   REHAB POTENTIAL: Good  CLINICAL DECISION MAKING: moderately complex EVALUATION COMPLEXITY: moderate   GOALS: Goals reviewed with patient? Yes  SHORT TERM GOALS: Target date: 12/11/2021  The patient will demonstrate knowledge of basic self care strategies and exercises to promote healing   Baseline: Goal status: INITIAL  2.  The patient will report a 30% improvement in back and shoulder pain levels with functional activities including reaching overhead, walking, standing Baseline:  Goal status: INITIAL  3.  The patient will have improved trunk flexor and extensor muscle strength and hip strength to at least 4/5 needed for lifting medium weight objects such as grocery bags  Baseline:  Goal status: INITIAL  4.  The patient will have improved shoulder elevation ROM to at least 122 degrees needed for grooming/dressing purposes as well as reaching high shelves  Baseline:  Goal status: INITIAL   LONG TERM GOALS: Target date: 01/08/2022  The patient will be independent in a safe self progression of a home exercise program to promote further recovery of function   Baseline:  Goal status: INITIAL  2.  The patient will report a 65% improvement in back and shoulder pain levels with functional activities which are currently difficult  including walking, standing, reaching overhead Baseline:  Goal status: INITIAL  3.  The patient will have improved shoulder elevation ROM to at least 140 degrees needed for grooming/dressing purposes as well as reaching high shelves  Baseline:  Goal status: INITIAL  4.  The patient will have grossly 4/5 strength needed to lift and lower a 1-3# object from a high shelf  Baseline:  Goal status:  INITIAL  5.  The patient will have improved FOTO score to   63%    indicating improved function with less pain  Baseline:  Goal status: INITIAL  6.  5x sit to stand test improved to 15 sec INITIAL     PLAN: PT FREQUENCY: 2x/week  PT DURATION: 8 weeks  PLANNED INTERVENTIONS: Therapeutic exercises, Therapeutic activity, Neuromuscular re-education, Patient/Family education, Self Care, Joint mobilization, Aquatic Therapy, Dry Needling, Electrical stimulation, Spinal mobilization, Cryotherapy, Moist heat, Traction, Ultrasound, Manual therapy, and Re-evaluation.  PLAN FOR NEXT SESSION: recheck shoulder ROM;  continue to progress and review HEP; Nu-Step;  supine Broomstick (cane) ex's;  flexion biased/neutral lumbo/pelvic/hip ex's (stenosis-like symptoms); yellow band rows and extensions; deltoid isometrics;  give clear detailed instructions with HEP Ruben Im,  PT 12/04/21 4:47 PM Phone: [phone] Fax: [phone]

## 2021-12-09 ENCOUNTER — Encounter: Payer: Self-pay | Admitting: Physical Therapy

## 2021-12-09 ENCOUNTER — Ambulatory Visit: Payer: Medicare Other | Admitting: Physical Therapy

## 2021-12-09 DIAGNOSIS — M5459 Other low back pain: Secondary | ICD-10-CM

## 2021-12-09 DIAGNOSIS — M6281 Muscle weakness (generalized): Secondary | ICD-10-CM | POA: Diagnosis not present

## 2021-12-09 DIAGNOSIS — R279 Unspecified lack of coordination: Secondary | ICD-10-CM

## 2021-12-09 DIAGNOSIS — M25612 Stiffness of left shoulder, not elsewhere classified: Secondary | ICD-10-CM

## 2021-12-09 DIAGNOSIS — M25512 Pain in left shoulder: Secondary | ICD-10-CM

## 2021-12-09 NOTE — Therapy (Addendum)
OUTPATIENT PHYSICAL THERAPY THORACOLUMBAR/SHOULDER PROGRESS NOTE   Patient Name: Savannah Diaz MRN: [id number] DOB:07/01/1932, 88 y.o., female Today's Date: 12/09/2021   PT End of Session - 12/09/21 1447     Visit Number 7    Date for PT Re-Evaluation 01/08/22    Authorization Type Medicare    Progress Note Due on Visit 10    PT Start Time 1447    PT Stop Time 1525    PT Time Calculation (min) 38 min    Activity Tolerance Patient tolerated treatment well    Behavior During Therapy WFL for tasks assessed/performed                Past Medical History:  Diagnosis Date   Anxiety    Arthritis    "qwhere; but it doesn't hurt" (05/12/2012)   Aspiration pneumonia ([place]) 03/21/2016   Cataract    left   Compression fracture of first lumbar vertebra ([place]) 07/2016   superior endplate   Depression    Dysphagia    "have had it for 7 yr; got worse 4 days ago" (05/12/2012)   Dysphagia, pharyngoesophageal phase - suspected 05/10/2012   GERD (gastroesophageal reflux disease)    Hypertension    Insomnia    Lumbago with sciatica, right side    Migraines    "menopause cured them" (05/12/2012)   Osteoporosis    Stage 3 chronic kidney disease ([place])    Uterine prolapse    Past Surgical History:  Procedure Laterality Date   CATARACT EXTRACTION W/ INTRAOCULAR LENS IMPLANT Left ? 2010   ESOPHAGEAL MANOMETRY N/A 11/08/2018   Procedure: ESOPHAGEAL MANOMETRY (EM);  Surgeon: Gatha Mayer, MD;  Location: [facility];  Service: Endoscopy;  Laterality: N/A;   ESOPHAGOGASTRODUODENOSCOPY N/A 05/10/2012   Procedure: ESOPHAGOGASTRODUODENOSCOPY (EGD);  Surgeon: Jerene Bears, MD;  Location: [facility];  Service: Gastroenterology;  Laterality: N/A;   TONSILLECTOMY  1940's   Patient Active Problem List   Diagnosis Date Noted   High risk medication use 08/08/2016   Influenza A 03/22/2016   Fall 03/21/2016   Acute on chronic kidney failure II-III 03/21/2016   Transient alteration of  awareness 05/10/2015   Hypokalemia 05/12/2012   Dysphagia oropharyngeal and pharyngeal 05/10/2012   Hypertension 04/28/2011   Osteoarthritis 04/28/2011   Agoraphobia 04/28/2011    PCP: Veleta Miners MD  REFERRING PROVIDER: Veleta Miners MD  REFERRING DIAG: M54,50, G89.29 Chronic bil low back pain without sciatica; left shoulder pain  Rationale for Evaluation and Treatment Rehabilitation  THERAPY DIAG:  Back pain; weakness  ONSET DATE: 09/12/2021  SUBJECTIVE:  SUBJECTIVE STATEMENT: I think my shoulders are getting better.     PERTINENT HISTORY:  Peripheral neuropathy Incontinence bladder/bowel  Right hand dominant;  scoliosis; osteoporosis; 2018 compression fracture L1 when fell backwards  PAIN:  Are you having pain? Yes NPRS scale: 2-3/10 Pain location: left shoulder - lateral deltoid and upper trap   Aggravating factors: mornings; elevating left arm; reaching for a skillet in cabinet;  walking 2,000 steps on pedometer;  prolonged standing Relieving factors: sitting, lying OK  PRECAUTIONS: None  WEIGHT BEARING RESTRICTIONS No  FALLS:  Has patient fallen in last 6 months? No  LIVING ENVIRONMENT: Lives with: lives with their family and Friends Home [place]   OCCUPATION: retired   PLOF: Independent with basic ADLs  PATIENT GOALS want to feel better.  Help shoulder pain; stand in the kitchen; not have to lie down after a walk   OBJECTIVE:   DIAGNOSTIC FINDINGS:  None recently  PATIENT SURVEYS:  FOTO shoulder 54%   COGNITION:  Overall cognitive status: Within functional limits for tasks assessed      MUSCLE LENGTH: Hamstrings: Right 50 deg; Left 50 deg Thomas test: Right 0 deg; Left 0 deg  POSTURE: rounded shoulders and decreased lumbar lordosis; mild forward  trunk   SHOULDER ROM:  RIGHT:  FLEXION:  140   ABDUCTION:   163  EXTERNAL ROTATION:   85  INTERNAL ROTATION: t8                                  LEFT:    FLEXION: 108     ABDUCTION:     65   EXTERNAL ROTATION:   63    INTERNAL ROTATION: L5  10/4:  115 with golf club; supine 112;  seated flexion 125 degrees:  abduction 93;  external rotation 62; internal rotation L1   SHOULDER STRENGTH :  left:  Flexion:  4-/5;  ABDUCTION 2+/5, external and internal rotation 4/5 LUMBAR ROM:   Active  A/PROM  eval 10/4  Flexion 50 50  Extension 5 8  Right lateral flexion 35 35  Left lateral flexion 35 35  Right rotation    Left rotation     (Blank rows = not tested)  LOWER EXTREMITY ROM:     TRUNK STRENGTH:  Decreased activation of transverse abdominus muscles; abdominals 4-/5; decreased activation of lumbar multifidi; trunk extensors 4-/5   LOWER EXTREMITY MMT:  Able to kneel down on floor and get back up with UE assist on chair;  able to sit to stand without UE assist; hip abduction 4/5   FUNCTIONAL TESTS:  5 times sit to stand: 17 Timed up and go (TUG): 12  no UE use  GAIT:  Comments: no assistive device;  WFLs    TODAY'S TREATMENT    12/09/21:   Nu-step L 1 x 8 min old model while discussing status Supine flat: flexion 90 degrees  12:00/6:00 and 9:00/3:00 small movements x 10 added 1# Bil UE Supine serratus punch 10x Bil 1# Sidelying shoulder abduction 12:00/6:00 and 9:00/3:00 small movements x 10 Sidelying shoulder external rotation 10x Supine beach ball on thighs UE pushes to activate lower abdominals 6x VC for more exhale Supine beach ball on thighs with UE and LEs lift offs 6x Seated cross body horizontal adduction stretch 3x  10 sec Standing in the door horizontal adduction stretch 3x 10 sec StandingYellow band single arm rows 2x10 Standing Yellow band single arm  extensions 10x Vc to keep elbow straight  10/11: Nu-step L 1 x 7 min old model while discussing status Supine  flat: flexion 90 degrees  12:00/6:00 and 9:00/3:00 small movements x 10 Supine serratus punch 10x Sidelying shoulder abduction 12:00/6:00 and 9:00/3:00 small movements x 10 Sidelying shoulder external rotation 10x Supine beach ball on thighs UE pushes to activate lower abdominals 5x Supine beach ball on thighs with UE and LEs lift offs 5x Seated cross body horizontal adduction stretch 2x  Standing in the door horizontal adduction stretch 2x StandingYellow band single arm rows 2x10 Standing Yellow band single arm extensions 10x Patient given yellow band for home use, anchored with knot/closed door Therapeutic activities:  reaching, pushing, pulling      12/02/21: Nu-step L 1 x 5 min old model. PTA present Supine flat:  golf club  used for AAROM shoulder flexion (horizontal and vertical hand placement on cane) 10x each, external rotation 10x, abduction 10x Sidelying shoulder abduction 12:00/6:00 and 9:00/3:00 small movements x 5 Supine clamshells with green loop 2x5 Supine piriformis stretch 20 sec each side  Seated thoracic extension with ball 10x   Date: 11/18/21 HEP established-see below  Nu-stp L 1 x 7 min Supine on wedge for incline - cane used for AAROM shoulder flexion, external rotation, abduction Piriformis stretch in supine Shoulder isometric standing - flex, abduction, ext  PATIENT EDUCATION:  Education details: Access Code: [email or web address] Person educated: Patient Education method: Explanation, Demonstration, Tactile cues, Verbal cues, and Handouts Education comprehension: verbalized understanding and returned demonstration   HOME EXERCISE PROGRAM: Access Code: [email or web address] URL: [email or web address] Date: 12/04/2021 Prepared by: Ruben Im  Exercises - Supine Shoulder Flexion Extension AAROM with Dowel  - 1 x daily - 7 x weekly - 3 sets - 10 reps - Seated Figure 4 Piriformis Stretch  - 1 x daily - 7 x weekly - 1 sets - 3 reps - 30 hold - Isometric  Shoulder Abduction at Wall  - 1 x daily - 7 x weekly - 1 sets - 10 reps - 5 sec hold - Isometric Shoulder Flexion at Wall  - 1 x daily - 7 x weekly - 3 sets - 10 reps - Isometric Shoulder Extension at Wall  - 1 x daily - 7 x weekly - 3 sets - 10 reps - Hooklying Isometric Hip Flexion  - 1 x daily - 7 x weekly - 2 sets - 5 reps - 5 hold - Seated Abdominal Set  - 1 x daily - 7 x weekly - 1 sets - 10 reps - Seated Thoracic Lumbar Extension  - 1 x daily - 7 x weekly - 1 sets - 10 reps - Hooklying Bent Leg Lifts  - 1 x daily - 7 x weekly - 1 sets - 10 reps - Clamshell  - 1 x daily - 7 x weekly - 1 sets - 10 reps - Sidelying Shoulder Abduction Palm Forward (Mirrored)  - 1 x daily - 7 x weekly - 3 sets - 5 reps - Supine Transversus Abdominis Bracing - Hands on Thighs  - 1 x daily - 7 x weekly - 1 sets - 10 reps - serratus punch  - 1 x daily - 7 x weekly - 1 sets - 10 reps - Sidelying Shoulder External Rotation  - 1 x daily - 7 x weekly - 1 sets - 10 reps - Standing Single Arm Row with Resistance Thumb Up (Mirrored)  - 1 x daily - 7 x weekly - 1  sets - 10 reps - Single Arm Shoulder Extension with Anchored Resistance (Mirrored)  - 1 x daily - 7 x weekly - 1 sets - 10 reps Access Code: [email or web address]  ASSESSMENT:  CLINICAL IMPRESSION: Pt arrives with mild Lt shoulder pain. Pt reports she feels her shoulder are getting better, less pain. Pt has difficulty with keeping elbow straight during protraction/retraction, otherwise she tolerates all exercises well.     OBJECTIVE IMPAIRMENTS decreased activity tolerance, difficulty walking, decreased ROM, decreased strength, impaired perceived functional ability, impaired UE functional use, and pain.   ACTIVITY LIMITATIONS carrying, lifting, standing, bathing, dressing, reach over head, and locomotion level  PARTICIPATION LIMITATIONS: meal prep, cleaning, laundry, and community activity  PERSONAL FACTORS Age, Time since onset of injury/illness/exacerbation, and  1-2 comorbidities: multiple body regions affected, peripheral neuropathy, scoliosis, osteoporosis  are also affecting patient's functional outcome.   REHAB POTENTIAL: Good  CLINICAL DECISION MAKING: moderately complex EVALUATION COMPLEXITY: moderate   GOALS: Goals reviewed with patient? Yes  SHORT TERM GOALS: Target date: 12/11/2021  The patient will demonstrate knowledge of basic self care strategies and exercises to promote healing   Baseline: Goal status: INITIAL  2.  The patient will report a 30% improvement in back and shoulder pain levels with functional activities including reaching overhead, walking, standing Baseline:  Goal status: INITIAL  3.  The patient will have improved trunk flexor and extensor muscle strength and hip strength to at least 4/5 needed for lifting medium weight objects such as grocery bags  Baseline:  Goal status: INITIAL  4.  The patient will have improved shoulder elevation ROM to at least 122 degrees needed for grooming/dressing purposes as well as reaching high shelves  Baseline:  Goal status: INITIAL   LONG TERM GOALS: Target date: 01/08/2022  The patient will be independent in a safe self progression of a home exercise program to promote further recovery of function   Baseline:  Goal status: INITIAL  2.  The patient will report a 65% improvement in back and shoulder pain levels with functional activities which are currently difficult  including walking, standing, reaching overhead Baseline:  Goal status: INITIAL  3.  The patient will have improved shoulder elevation ROM to at least 140 degrees needed for grooming/dressing purposes as well as reaching high shelves  Baseline:  Goal status: INITIAL  4.  The patient will have grossly 4/5 strength needed to lift and lower a 1-3# object from a high shelf  Baseline:  Goal status: INITIAL  5.  The patient will have improved FOTO score to   63%    indicating improved function with less pain   Baseline:  Goal status: INITIAL  6.  5x sit to stand test improved to 15 sec INITIAL     PLAN: PT FREQUENCY: 2x/week  PT DURATION: 8 weeks  PLANNED INTERVENTIONS: Therapeutic exercises, Therapeutic activity, Neuromuscular re-education, Patient/Family education, Self Care, Joint mobilization, Aquatic Therapy, Dry Needling, Electrical stimulation, Spinal mobilization, Cryotherapy, Moist heat, Traction, Ultrasound, Manual therapy, and Re-evaluation.  PLAN FOR NEXT SESSION: recheck shoulder ROM;  continue to progress and review HEP; Nu-Step;  supine Broomstick (cane) ex's;  flexion biased/neutral lumbo/pelvic/hip ex's (stenosis-like symptoms); yellow band rows and extensions; deltoid isometrics;  give clear detailed instructions with HEP  Myrene Galas, PTA 12/09/21 3:23 PM  Phone: [phone] Fax: [phone]

## 2021-12-10 DIAGNOSIS — Z23 Encounter for immunization: Secondary | ICD-10-CM | POA: Diagnosis not present

## 2021-12-11 ENCOUNTER — Ambulatory Visit: Payer: Medicare Other | Admitting: Physical Therapy

## 2021-12-16 ENCOUNTER — Ambulatory Visit: Payer: Medicare Other | Admitting: Physical Therapy

## 2021-12-16 ENCOUNTER — Encounter: Payer: Self-pay | Admitting: Physical Therapy

## 2021-12-16 DIAGNOSIS — M5459 Other low back pain: Secondary | ICD-10-CM

## 2021-12-16 DIAGNOSIS — M6281 Muscle weakness (generalized): Secondary | ICD-10-CM

## 2021-12-16 DIAGNOSIS — M25512 Pain in left shoulder: Secondary | ICD-10-CM

## 2021-12-16 DIAGNOSIS — M25612 Stiffness of left shoulder, not elsewhere classified: Secondary | ICD-10-CM | POA: Diagnosis not present

## 2021-12-16 DIAGNOSIS — R279 Unspecified lack of coordination: Secondary | ICD-10-CM

## 2021-12-16 NOTE — Therapy (Signed)
OUTPATIENT PHYSICAL THERAPY THORACOLUMBAR/SHOULDER PROGRESS NOTE   Patient Name: Savannah Diaz MRN: [id number] DOB:1933/01/23, 88 y.o., female Today's Date: 12/16/2021   PT End of Session - 12/16/21 1406     Visit Number 8    Date for PT Re-Evaluation 01/08/22    Authorization Type Medicare    Progress Note Due on Visit 10    PT Start Time 1404    PT Stop Time 1443    PT Time Calculation (min) 39 min    Activity Tolerance Patient tolerated treatment well    Behavior During Therapy [facility] for tasks assessed/performed                 Past Medical History:  Diagnosis Date   Anxiety    Arthritis    "qwhere; but it doesn't hurt" (05/12/2012)   Aspiration pneumonia ([place]) 03/21/2016   Cataract    left   Compression fracture of first lumbar vertebra ([place]) 07/2016   superior endplate   Depression    Dysphagia    "have had it for 7 yr; got worse 4 days ago" (05/12/2012)   Dysphagia, pharyngoesophageal phase - suspected 05/10/2012   GERD (gastroesophageal reflux disease)    Hypertension    Insomnia    Lumbago with sciatica, right side    Migraines    "menopause cured them" (05/12/2012)   Osteoporosis    Stage 3 chronic kidney disease ([place])    Uterine prolapse    Past Surgical History:  Procedure Laterality Date   CATARACT EXTRACTION W/ INTRAOCULAR LENS IMPLANT Left ? 2010   ESOPHAGEAL MANOMETRY N/A 11/08/2018   Procedure: ESOPHAGEAL MANOMETRY (EM);  Surgeon: Gatha Mayer, MD;  Location: [facility];  Service: Endoscopy;  Laterality: N/A;   ESOPHAGOGASTRODUODENOSCOPY N/A 05/10/2012   Procedure: ESOPHAGOGASTRODUODENOSCOPY (EGD);  Surgeon: Jerene Bears, MD;  Location: [facility];  Service: Gastroenterology;  Laterality: N/A;   TONSILLECTOMY  1940's   Patient Active Problem List   Diagnosis Date Noted   High risk medication use 08/08/2016   Influenza A 03/22/2016   Fall 03/21/2016   Acute on chronic kidney failure II-III 03/21/2016   Transient alteration of  awareness 05/10/2015   Hypokalemia 05/12/2012   Dysphagia oropharyngeal and pharyngeal 05/10/2012   Hypertension 04/28/2011   Osteoarthritis 04/28/2011   Agoraphobia 04/28/2011    PCP: Veleta Miners MD  REFERRING PROVIDER: Veleta Miners MD  REFERRING DIAG: M54,50, G89.29 Chronic bil low back pain without sciatica; left shoulder pain  Rationale for Evaluation and Treatment Rehabilitation  THERAPY DIAG:  Back pain; weakness  ONSET DATE: 09/12/2021  SUBJECTIVE:  SUBJECTIVE STATEMENT: I got a COVID and RSV shot in my arm two days ago and I had a bad reaction for 24 hours. I feel better today.   PERTINENT HISTORY:  Peripheral neuropathy Incontinence bladder/bowel  Right hand dominant;  scoliosis; osteoporosis; 2018 compression fracture L1 when fell backwards  PAIN:  Are you having pain? Yes NPRS scale: 2-3/10 Pain location: left shoulder - lateral deltoid and upper trap   Aggravating factors: mornings; elevating left arm; reaching for a skillet in cabinet;  walking 2,000 steps on pedometer;  prolonged standing Relieving factors: sitting, lying OK  PRECAUTIONS: None  WEIGHT BEARING RESTRICTIONS No  FALLS:  Has patient fallen in last 6 months? No  LIVING ENVIRONMENT: Lives with: lives with their family and Friends Home [place]   OCCUPATION: retired   PLOF: Independent with basic ADLs  PATIENT GOALS want to feel better.  Help shoulder pain; stand in the kitchen; not have to lie down after a walk   OBJECTIVE:   DIAGNOSTIC FINDINGS:  None recently  PATIENT SURVEYS:  FOTO shoulder 54%   COGNITION:  Overall cognitive status: Within functional limits for tasks assessed      MUSCLE LENGTH: Hamstrings: Right 50 deg; Left 50 deg Thomas test: Right 0 deg; Left 0 deg  POSTURE: rounded  shoulders and decreased lumbar lordosis; mild forward trunk   SHOULDER ROM:  RIGHT:  FLEXION:  140   ABDUCTION:   163  EXTERNAL ROTATION:   85  INTERNAL ROTATION: t8                                  LEFT:    FLEXION: 108     ABDUCTION:     65   EXTERNAL ROTATION:   63    INTERNAL ROTATION: L5  10/4:  115 with golf club; supine 112;  seated flexion 125 degrees:  abduction 93;  external rotation 62; internal rotation L1   12/16/21:RT Shoulder: Flexion 148, Abduction 165, Ext Rot 90, Int Rot T8: Left Shoulder: Flexion 108, Abduction 10 slightly out of plane, Ext rot 62, Internal rot L1  Supine flexion with cane: 122 degrees  SHOULDER STRENGTH :  left:  Flexion:  4-/5;  ABDUCTION 2+/5, external and internal rotation 4/5  LUMBAR ROM:   Active  A/PROM  eval 10/4  Flexion 50 50  Extension 5 8  Right lateral flexion 35 35  Left lateral flexion 35 35  Right rotation    Left rotation     (Blank rows = not tested)  LOWER EXTREMITY ROM:     TRUNK STRENGTH:  Decreased activation of transverse abdominus muscles; abdominals 4-/5; decreased activation of lumbar multifidi; trunk extensors 4-/5   LOWER EXTREMITY MMT:  Able to kneel down on floor and get back up with UE assist on chair;  able to sit to stand without UE assist; hip abduction 4/5   FUNCTIONAL TESTS:  5 times sit to stand: 17 Timed up and go (TUG): 12  no UE use 12/16/21: 5x sit to stand 12 sec   GAIT:  Comments: no assistive device;  WFLs    TODAY'S TREATMENT   12/16/21: Nu-step L 1 x 10 min old model while discussing status ROM measurements: see above 5x sit to stand see above REVIEW OF GOALS  12/09/21:   Nu-step L 1 x 8 min old model while discussing status Supine flat: flexion 90 degrees  12:00/6:00 and 9:00/3:00 small  movements x 10 added 1# Bil UE Supine serratus punch 10x Bil 1# Sidelying shoulder abduction 12:00/6:00 and 9:00/3:00 small movements x 10 Sidelying shoulder external rotation 10x Supine beach  ball on thighs UE pushes to activate lower abdominals 6x VC for more exhale Supine beach ball on thighs with UE and LEs lift offs 6x Seated cross body horizontal adduction stretch 3x  10 sec Standing in the door horizontal adduction stretch 3x 10 sec StandingYellow band single arm rows 2x10 Standing Yellow band single arm extensions 10x Vc to keep elbow straight  10/11: Nu-step L 1 x 7 min old model while discussing status Supine flat: flexion 90 degrees  12:00/6:00 and 9:00/3:00 small movements x 10 Supine serratus punch 10x Sidelying shoulder abduction 12:00/6:00 and 9:00/3:00 small movements x 10 Sidelying shoulder external rotation 10x Supine beach ball on thighs UE pushes to activate lower abdominals 5x Supine beach ball on thighs with UE and LEs lift offs 5x Seated cross body horizontal adduction stretch 2x  Standing in the door horizontal adduction stretch 2x StandingYellow band single arm rows 2x10 Standing Yellow band single arm extensions 10x Patient given yellow band for home use, anchored with knot/closed door Therapeutic activities:  reaching, pushing, pulling      PATIENT EDUCATION:  Education details: Access Code: [email or web address] Person educated: Patient Education method: Explanation, Demonstration, Tactile cues, Verbal cues, and Handouts Education comprehension: verbalized understanding and returned demonstration   HOME EXERCISE PROGRAM: Access Code: [email or web address] URL: [email or web address] Date: 12/04/2021 Prepared by: Ruben Im  Exercises - Supine Shoulder Flexion Extension AAROM with Dowel  - 1 x daily - 7 x weekly - 3 sets - 10 reps - Seated Figure 4 Piriformis Stretch  - 1 x daily - 7 x weekly - 1 sets - 3 reps - 30 hold - Isometric Shoulder Abduction at Wall  - 1 x daily - 7 x weekly - 1 sets - 10 reps - 5 sec hold - Isometric Shoulder Flexion at Wall  - 1 x daily - 7 x weekly - 3 sets - 10 reps - Isometric Shoulder Extension at Wall  - 1 x daily  - 7 x weekly - 3 sets - 10 reps - Hooklying Isometric Hip Flexion  - 1 x daily - 7 x weekly - 2 sets - 5 reps - 5 hold - Seated Abdominal Set  - 1 x daily - 7 x weekly - 1 sets - 10 reps - Seated Thoracic Lumbar Extension  - 1 x daily - 7 x weekly - 1 sets - 10 reps - Hooklying Bent Leg Lifts  - 1 x daily - 7 x weekly - 1 sets - 10 reps - Clamshell  - 1 x daily - 7 x weekly - 1 sets - 10 reps - Sidelying Shoulder Abduction Palm Forward (Mirrored)  - 1 x daily - 7 x weekly - 3 sets - 5 reps - Supine Transversus Abdominis Bracing - Hands on Thighs  - 1 x daily - 7 x weekly - 1 sets - 10 reps - serratus punch  - 1 x daily - 7 x weekly - 1 sets - 10 reps - Sidelying Shoulder External Rotation  - 1 x daily - 7 x weekly - 1 sets - 10 reps - Standing Single Arm Row with Resistance Thumb Up (Mirrored)  - 1 x daily - 7 x weekly - 1 sets - 10 reps - Single Arm Shoulder Extension with Anchored Resistance (Mirrored)  - 1 x daily - 7  x weekly - 1 sets - 10 reps Access Code: [email or web address]  ASSESSMENT:  CLINICAL IMPRESSION: Pt is happy with her shoulder ROM improving although Left shoulder does measure about the same as last measurement. Passively with stick pt had great improvement. Pt also met LTG for 5x sit to stand. Pt reports she would like to work more on exercises that will help her stand longer in her kitchen with les back pain.   OBJECTIVE IMPAIRMENTS decreased activity tolerance, difficulty walking, decreased ROM, decreased strength, impaired perceived functional ability, impaired UE functional use, and pain.   ACTIVITY LIMITATIONS carrying, lifting, standing, bathing, dressing, reach over head, and locomotion level  PARTICIPATION LIMITATIONS: meal prep, cleaning, laundry, and community activity  PERSONAL FACTORS Age, Time since onset of injury/illness/exacerbation, and 1-2 comorbidities: multiple body regions affected, peripheral neuropathy, scoliosis, osteoporosis  are also affecting patient's  functional outcome.   REHAB POTENTIAL: Good  CLINICAL DECISION MAKING: moderately complex EVALUATION COMPLEXITY: moderate   GOALS: Goals reviewed with patient? Yes  SHORT TERM GOALS: Target date: 12/11/2021  The patient will demonstrate knowledge of basic self care strategies and exercises to promote healing   Baseline: Goal status:Goal met 12/16/21  2.  The patient will report a 30% improvement in back and shoulder pain levels with functional activities including reaching overhead, walking, standing Baseline:  Goal status: On going 20%  3.  The patient will have improved trunk flexor and extensor muscle strength and hip strength to at least 4/5 needed for lifting medium weight objects such as grocery bags  Baseline:  Goal status: INITIAL  4.  The patient will have improved shoulder elevation ROM to at least 122 degrees needed for grooming/dressing purposes as well as reaching high shelves  Baseline:  Goal status: INITIAL   LONG TERM GOALS: Target date: 01/08/2022  The patient will be independent in a safe self progression of a home exercise program to promote further recovery of function   Baseline:  Goal status: INITIAL  2.  The patient will report a 65% improvement in back and shoulder pain levels with functional activities which are currently difficult  including walking, standing, reaching overhead Baseline:  Goal status: INITIAL  3.  The patient will have improved shoulder elevation ROM to at least 140 degrees needed for grooming/dressing purposes as well as reaching high shelves  Baseline:  Goal status: INITIAL  4.  The patient will have grossly 4/5 strength needed to lift and lower a 1-3# object from a high shelf  Baseline:  Goal status: INITIAL  5.  The patient will have improved FOTO score to   63%    indicating improved function with less pain  Baseline:  Goal status: INITIAL  6.  5x sit to stand test improved to 15 sec Goal met 12  sec    PLAN: PT  FREQUENCY: 2x/week  PT DURATION: 8 weeks  PLANNED INTERVENTIONS: Therapeutic exercises, Therapeutic activity, Neuromuscular re-education, Patient/Family education, Self Care, Joint mobilization, Aquatic Therapy, Dry Needling, Electrical stimulation, Spinal mobilization, Cryotherapy, Moist heat, Traction, Ultrasound, Manual therapy, and Re-evaluation.  PLAN FOR NEXT SESSION: Review HEP and add or progress exercises that will help pt stand longer in her kitchen.    Myrene Galas, PTA 12/16/21 2:51 PM  Phone: [phone] Fax: [phone]

## 2021-12-18 ENCOUNTER — Ambulatory Visit: Payer: Medicare Other | Admitting: Physical Therapy

## 2021-12-18 DIAGNOSIS — M6281 Muscle weakness (generalized): Secondary | ICD-10-CM

## 2021-12-18 DIAGNOSIS — M25512 Pain in left shoulder: Secondary | ICD-10-CM | POA: Diagnosis not present

## 2021-12-18 DIAGNOSIS — M5459 Other low back pain: Secondary | ICD-10-CM | POA: Diagnosis not present

## 2021-12-18 DIAGNOSIS — R279 Unspecified lack of coordination: Secondary | ICD-10-CM | POA: Diagnosis not present

## 2021-12-18 DIAGNOSIS — M25612 Stiffness of left shoulder, not elsewhere classified: Secondary | ICD-10-CM | POA: Diagnosis not present

## 2021-12-18 NOTE — Therapy (Signed)
OUTPATIENT PHYSICAL THERAPY THORACOLUMBAR/SHOULDER PROGRESS NOTE   Patient Name: Savannah Diaz MRN: [id number] DOB:[birth year], 88 y.o., female Today's Date: 12/18/2021   PT End of Session - 12/18/21 1359     Visit Number 9    Date for PT Re-Evaluation 01/08/22    Authorization Type Medicare    Progress Note Due on Visit 10    PT Start Time 1400    PT Stop Time 1440    PT Time Calculation (min) 40 min    Activity Tolerance Patient tolerated treatment well                 Past Medical History:  Diagnosis Date   Anxiety    Arthritis    "qwhere; but it doesn't hurt" (05/12/2012)   Aspiration pneumonia ([place]) 03/21/2016   Cataract    left   Compression fracture of first lumbar vertebra ([place]) 07/2016   superior endplate   Depression    Dysphagia    "have had it for 7 yr; got worse 4 days ago" (05/12/2012)   Dysphagia, pharyngoesophageal phase - suspected 05/10/2012   GERD (gastroesophageal reflux disease)    Hypertension    Insomnia    Lumbago with sciatica, right side    Migraines    "menopause cured them" (05/12/2012)   Osteoporosis    Stage 3 chronic kidney disease ([place])    Uterine prolapse    Past Surgical History:  Procedure Laterality Date   CATARACT EXTRACTION W/ INTRAOCULAR LENS IMPLANT Left ? 2010   ESOPHAGEAL MANOMETRY N/A 11/08/2018   Procedure: ESOPHAGEAL MANOMETRY (EM);  Surgeon: Gatha Mayer, MD;  Location: [facility];  Service: Endoscopy;  Laterality: N/A;   ESOPHAGOGASTRODUODENOSCOPY N/A 05/10/2012   Procedure: ESOPHAGOGASTRODUODENOSCOPY (EGD);  Surgeon: Jerene Bears, MD;  Location: [facility];  Service: Gastroenterology;  Laterality: N/A;   TONSILLECTOMY  1940's   Patient Active Problem List   Diagnosis Date Noted   High risk medication use 08/08/2016   Influenza A 03/22/2016   Fall 03/21/2016   Acute on chronic kidney failure II-III 03/21/2016   Transient alteration of awareness 05/10/2015   Hypokalemia 05/12/2012   Dysphagia  oropharyngeal and pharyngeal 05/10/2012   Hypertension 04/28/2011   Osteoarthritis 04/28/2011   Agoraphobia 04/28/2011    PCP: Veleta Miners MD  REFERRING PROVIDER: Veleta Miners MD  REFERRING DIAG: M54,50, G89.29 Chronic bil low back pain without sciatica; left shoulder pain  Rationale for Evaluation and Treatment Rehabilitation  THERAPY DIAG:  Back pain; weakness  ONSET DATE: 09/12/2021  SUBJECTIVE:  SUBJECTIVE STATEMENT: Less soreness in shoulders.  I can go further with broomstick.   My (upper traps) get tight.  I want to be able to stand in the kitchen.    PERTINENT HISTORY:  Peripheral neuropathy Incontinence bladder/bowel  Right hand dominant;  scoliosis; osteoporosis; 2018 compression fracture L1 when fell backwards  PAIN:  Are you having pain? Yes NPRS scale: 2/10 Pain location: left shoulder - lateral deltoid and upper trap; back hurts at different times   Aggravating factors: mornings; elevating left arm; reaching for a skillet in cabinet;  walking 2,000 steps on pedometer;  prolonged standing Relieving factors: sitting, lying OK  PRECAUTIONS: None  WEIGHT BEARING RESTRICTIONS No  FALLS:  Has patient fallen in last 6 months? No  LIVING ENVIRONMENT: Lives with: lives with their family and [facility] Home [place]   OCCUPATION: retired   PLOF: Independent with basic ADLs  PATIENT GOALS want to feel better.  Help shoulder pain; stand in the kitchen; not have to lie down after a walk   OBJECTIVE:   DIAGNOSTIC FINDINGS:  None recently  PATIENT SURVEYS:  FOTO shoulder 54%   COGNITION:  Overall cognitive status: Within functional limits for tasks assessed      MUSCLE LENGTH: Hamstrings: Right 50 deg; Left 50 deg Thomas test: Right 0 deg; Left 0 deg  POSTURE: rounded  shoulders and decreased lumbar lordosis; mild forward trunk   SHOULDER ROM:  RIGHT:  FLEXION:  140   ABDUCTION:   163  EXTERNAL ROTATION:   85  INTERNAL ROTATION: t8                                  LEFT:    FLEXION: 108     ABDUCTION:     65   EXTERNAL ROTATION:   63    INTERNAL ROTATION: L5  10/4:  115 with golf club; supine 112;  seated flexion 125 degrees:  abduction 93;  external rotation 62; internal rotation L1   12/16/21:RT Shoulder: Flexion 148, Abduction 165, Ext Rot 90, Int Rot T8: Left Shoulder: Flexion 108, Abduction 10 slightly out of plane, Ext rot 62, Internal rot L1  Supine flexion with cane: 122 degrees  SHOULDER STRENGTH :  left:  Flexion:  4-/5;  ABDUCTION 2+/5, external and internal rotation 4/5  LUMBAR ROM:   Active  A/PROM  eval 10/4  Flexion 50 50  Extension 5 8  Right lateral flexion 35 35  Left lateral flexion 35 35  Right rotation    Left rotation     (Blank rows = not tested)  LOWER EXTREMITY ROM:     TRUNK STRENGTH:  Decreased activation of transverse abdominus muscles; abdominals 4-/5; decreased activation of lumbar multifidi; trunk extensors 4-/5   LOWER EXTREMITY MMT:  Able to kneel down on floor and get back up with UE assist on chair;  able to sit to stand without UE assist; hip abduction 4/5   FUNCTIONAL TESTS:  5 times sit to stand: 17 Timed up and go (TUG): 12  no UE use 12/16/21: 5x sit to stand 12 sec   GAIT:  Comments: no assistive device;  WFLs    TODAY'S TREATMENT  10/25: Nu-step L 5 x 7 min new model while discussing status Seated upper trap stretch 3x 30 sec Seated thoracic extension with ball 10x  Sit to stand no UEs 5x Back to wall abdominal draw: toe taps, heel taps, bil  UE flexion 5x each Standing at the counter strategies: weight shifting, foot prop on cabinet shelf, ab draw in Yellow band row 10x right/left Yellow band shoulder extension 10x right/left Yellow band internal rotation 10x right/left Therapeutic  activities:  standing at the counter; reaching, pushing, pulling      12/16/21: Nu-step L 1 x 10 min old model while discussing status ROM measurements: see above 5x sit to stand see above REVIEW OF GOALS  12/09/21:   Nu-step L 1 x 8 min old model while discussing status Supine flat: flexion 90 degrees  12:00/6:00 and 9:00/3:00 small movements x 10 added 1# Bil UE Supine serratus punch 10x Bil 1# Sidelying shoulder abduction 12:00/6:00 and 9:00/3:00 small movements x 10 Sidelying shoulder external rotation 10x Supine beach ball on thighs UE pushes to activate lower abdominals 6x VC for more exhale Supine beach ball on thighs with UE and LEs lift offs 6x Seated cross body horizontal adduction stretch 3x  10 sec Standing in the door horizontal adduction stretch 3x 10 sec StandingYellow band single arm rows 2x10 Standing Yellow band single arm extensions 10x Vc to keep elbow straight      PATIENT EDUCATION:  Education details: Access Code: [email or web address] Person educated: Patient Education method: Explanation, Demonstration, Tactile cues, Verbal cues, and Handouts Education comprehension: verbalized understanding and returned demonstration   HOME EXERCISE PROGRAM: Access Code: [email or web address] URL: [email or web address] Date: 12/18/2021 Prepared by: Ruben Im  Exercises - Supine Shoulder Flexion Extension AAROM with Dowel  - 1 x daily - 7 x weekly - 3 sets - 10 reps - Seated Figure 4 Piriformis Stretch  - 1 x daily - 7 x weekly - 1 sets - 3 reps - 30 hold - Hooklying Isometric Hip Flexion  - 1 x daily - 7 x weekly - 2 sets - 5 reps - 5 hold - Seated Abdominal Set  - 1 x daily - 7 x weekly - 1 sets - 10 reps - Seated Thoracic Lumbar Extension  - 1 x daily - 7 x weekly - 1 sets - 10 reps - Hooklying Bent Leg Lifts  - 1 x daily - 7 x weekly - 1 sets - 10 reps - Clamshell  - 1 x daily - 7 x weekly - 1 sets - 10 reps - Sidelying Shoulder Abduction Palm Forward (Mirrored)   - 1 x daily - 7 x weekly - 3 sets - 5 reps - Supine Transversus Abdominis Bracing - Hands on Thighs  - 1 x daily - 7 x weekly - 1 sets - 10 reps - serratus punch  - 1 x daily - 7 x weekly - 1 sets - 10 reps - Sidelying Shoulder External Rotation  - 1 x daily - 7 x weekly - 1 sets - 10 reps - Standing Single Arm Row with Resistance Thumb Up (Mirrored)  - 1 x daily - 7 x weekly - 1 sets - 10 reps - Single Arm Shoulder Extension with Anchored Resistance (Mirrored)  - 1 x daily - 7 x weekly - 1 sets - 10 reps - Seated Upper Trapezius Stretch  - 1 x daily - 7 x weekly - 1 sets - 2-3 reps - 30 hold - Shoulder Internal Rotation with Resistance  - 1 x daily - 7 x weekly - 1 sets - 10 reps - Standing with Back Flat Against Wall  - 1 x daily - 7 x weekly - 1 sets - 10 reps    - ASSESSMENT:  CLINICAL  IMPRESSION: Progressed HEP to add resisted internal rotation with band, upper trap stretch and standing abdominal draw-in.  Good response with all and written and pictorial instructions provided.   Verbal and tactile cues for proper recruitment of middle and lower trap muscles and to avoid compensatory shoulder hike.   OBJECTIVE IMPAIRMENTS decreased activity tolerance, difficulty walking, decreased ROM, decreased strength, impaired perceived functional ability, impaired UE functional use, and pain.   ACTIVITY LIMITATIONS carrying, lifting, standing, bathing, dressing, reach over head, and locomotion level  PARTICIPATION LIMITATIONS: meal prep, cleaning, laundry, and community activity  PERSONAL FACTORS Age, Time since onset of injury/illness/exacerbation, and 1-2 comorbidities: multiple body regions affected, peripheral neuropathy, scoliosis, osteoporosis  are also affecting patient's functional outcome.   REHAB POTENTIAL: Good  CLINICAL DECISION MAKING: moderately complex EVALUATION COMPLEXITY: moderate   GOALS: Goals reviewed with patient? Yes  SHORT TERM GOALS: Target date: 12/11/2021  The  patient will demonstrate knowledge of basic self care strategies and exercises to promote healing   Baseline: Goal status:Goal met 12/16/21  2.  The patient will report a 30% improvement in back and shoulder pain levels with functional activities including reaching overhead, walking, standing Baseline:  Goal status: On going 20%  3.  The patient will have improved trunk flexor and extensor muscle strength and hip strength to at least 4/5 needed for lifting medium weight objects such as grocery bags  Baseline:  Goal status: ongoing  4.  The patient will have improved shoulder elevation ROM to at least 122 degrees needed for grooming/dressing purposes as well as reaching high shelves  Baseline:  Goal status: ongoing   LONG TERM GOALS: Target date: 01/08/2022  The patient will be independent in a safe self progression of a home exercise program to promote further recovery of function   Baseline:  Goal status: INITIAL  2.  The patient will report a 65% improvement in back and shoulder pain levels with functional activities which are currently difficult  including walking, standing, reaching overhead Baseline:  Goal status: INITIAL  3.  The patient will have improved shoulder elevation ROM to at least 140 degrees needed for grooming/dressing purposes as well as reaching high shelves  Baseline:  Goal status: INITIAL  4.  The patient will have grossly 4/5 strength needed to lift and lower a 1-3# object from a high shelf  Baseline:  Goal status: INITIAL  5.  The patient will have improved FOTO score to   63%    indicating improved function with less pain  Baseline:  Goal status: INITIAL  6.  5x sit to stand test improved to 15 sec Goal met 12  sec    PLAN: PT FREQUENCY: 2x/week  PT DURATION: 8 weeks  PLANNED INTERVENTIONS: Therapeutic exercises, Therapeutic activity, Neuromuscular re-education, Patient/Family education, Self Care, Joint mobilization, Aquatic Therapy, Dry  Needling, Electrical stimulation, Spinal mobilization, Cryotherapy, Moist heat, Traction, Ultrasound, Manual therapy, and Re-evaluation.  PLAN FOR NEXT SESSION: Review HEP and add or progress exercises that will help pt stand longer in her kitchen. Try yellow band small arc external rotation.  Ruben Im, PT 12/18/21 8:21 PM Phone: [phone] Fax: [phone]

## 2021-12-23 ENCOUNTER — Ambulatory Visit: Payer: Medicare Other | Admitting: Physical Therapy

## 2021-12-23 ENCOUNTER — Encounter: Payer: Self-pay | Admitting: Physical Therapy

## 2021-12-23 DIAGNOSIS — R279 Unspecified lack of coordination: Secondary | ICD-10-CM | POA: Diagnosis not present

## 2021-12-23 DIAGNOSIS — M25612 Stiffness of left shoulder, not elsewhere classified: Secondary | ICD-10-CM | POA: Diagnosis not present

## 2021-12-23 DIAGNOSIS — M5459 Other low back pain: Secondary | ICD-10-CM | POA: Diagnosis not present

## 2021-12-23 DIAGNOSIS — M25512 Pain in left shoulder: Secondary | ICD-10-CM

## 2021-12-23 DIAGNOSIS — M6281 Muscle weakness (generalized): Secondary | ICD-10-CM

## 2021-12-23 NOTE — Therapy (Signed)
OUTPATIENT PHYSICAL THERAPY THORACOLUMBAR/SHOULDER   Patient Name: Savannah Diaz MRN: [id number] DOB:[birth year], 88 y.o., female Today's Date: 12/23/2021   Progress Note Reporting Period 11/13/21 to 12/23/21  See note below for Objective Data and Assessment of Progress/Goals.        PT End of Session - 12/23/21 1358     Visit Number 10    Date for PT Re-Evaluation 01/08/22    Authorization Type Medicare    Progress Note Due on Visit 10    PT Start Time 1359    PT Stop Time 1438    PT Time Calculation (min) 39 min    Activity Tolerance Patient tolerated treatment well    Behavior During Therapy WFL for tasks assessed/performed                  Past Medical History:  Diagnosis Date   Anxiety    Arthritis    "qwhere; but it doesn't hurt" (05/12/2012)   Aspiration pneumonia ([place]) 03/21/2016   Cataract    left   Compression fracture of first lumbar vertebra ([place]) 07/2016   superior endplate   Depression    Dysphagia    "have had it for 7 yr; got worse 4 days ago" (05/12/2012)   Dysphagia, pharyngoesophageal phase - suspected 05/10/2012   GERD (gastroesophageal reflux disease)    Hypertension    Insomnia    Lumbago with sciatica, right side    Migraines    "menopause cured them" (05/12/2012)   Osteoporosis    Stage 3 chronic kidney disease ([place])    Uterine prolapse    Past Surgical History:  Procedure Laterality Date   CATARACT EXTRACTION W/ INTRAOCULAR LENS IMPLANT Left ? 2010   ESOPHAGEAL MANOMETRY N/A 11/08/2018   Procedure: ESOPHAGEAL MANOMETRY (EM);  Surgeon: Gatha Mayer, MD;  Location: [facility];  Service: Endoscopy;  Laterality: N/A;   ESOPHAGOGASTRODUODENOSCOPY N/A 05/10/2012   Procedure: ESOPHAGOGASTRODUODENOSCOPY (EGD);  Surgeon: Jerene Bears, MD;  Location: [facility];  Service: Gastroenterology;  Laterality: N/A;   TONSILLECTOMY  1940's   Patient Active Problem List   Diagnosis Date Noted   High risk medication use 08/08/2016    Influenza A 03/22/2016   Fall 03/21/2016   Acute on chronic kidney failure II-III 03/21/2016   Transient alteration of awareness 05/10/2015   Hypokalemia 05/12/2012   Dysphagia oropharyngeal and pharyngeal 05/10/2012   Hypertension 04/28/2011   Osteoarthritis 04/28/2011   Agoraphobia 04/28/2011    PCP: Veleta Miners MD  REFERRING PROVIDER: Veleta Miners MD  REFERRING DIAG: M54,50, G89.29 Chronic bil low back pain without sciatica; left shoulder pain  Rationale for Evaluation and Treatment Rehabilitation  THERAPY DIAG:  Back pain; weakness  ONSET DATE: 09/12/2021  SUBJECTIVE:  SUBJECTIVE STATEMENT: Thursday was a great day. Today is pretty good.   PERTINENT HISTORY:  Peripheral neuropathy Incontinence bladder/bowel  Right hand dominant;  scoliosis; osteoporosis; 2018 compression fracture L1 when fell backwards  PAIN:  Are you having pain? Yes NPRS scale: 2/10 Pain location: left shoulder - lateral deltoid and upper trap; back hurts at different times   Aggravating factors: mornings; elevating left arm; reaching for a skillet in cabinet;  walking 2,000 steps on pedometer;  prolonged standing Relieving factors: sitting, lying OK  PRECAUTIONS: None  WEIGHT BEARING RESTRICTIONS No  FALLS:  Has patient fallen in last 6 months? No  LIVING ENVIRONMENT: Lives with: lives with their family and Friends Home [place]   OCCUPATION: retired   PLOF: Independent with basic ADLs  PATIENT GOALS want to feel better.  Help shoulder pain; stand in the kitchen; not have to lie down after a walk   OBJECTIVE:   DIAGNOSTIC FINDINGS:  None recently  PATIENT SURVEYS:  FOTO shoulder 54%   COGNITION:  Overall cognitive status: Within functional limits for tasks assessed      MUSCLE  LENGTH: Hamstrings: Right 50 deg; Left 50 deg Thomas test: Right 0 deg; Left 0 deg  POSTURE: rounded shoulders and decreased lumbar lordosis; mild forward trunk   SHOULDER ROM:  RIGHT:  FLEXION:  140   ABDUCTION:   163  EXTERNAL ROTATION:   85  INTERNAL ROTATION: t8                                  LEFT:    FLEXION: 108     ABDUCTION:     65   EXTERNAL ROTATION:   63    INTERNAL ROTATION: L5  10/4:  115 with golf club; supine 112;  seated flexion 125 degrees:  abduction 93;  external rotation 62; internal rotation L1   12/16/21:RT Shoulder: Flexion 148, Abduction 165, Ext Rot 90, Int Rot T8: Left Shoulder: Flexion 108, Abduction 10 slightly out of plane, Ext rot 62, Internal rot L1  Supine flexion with cane: 122 degrees  SHOULDER STRENGTH :  left:  Flexion:  4-/5;  ABDUCTION 2+/5, external and internal rotation 4/5  LUMBAR ROM:   Active  A/PROM  eval 10/4  Flexion 50 50  Extension 5 8  Right lateral flexion 35 35  Left lateral flexion 35 35  Right rotation    Left rotation     (Blank rows = not tested)  LOWER EXTREMITY ROM:     TRUNK STRENGTH:  Decreased activation of transverse abdominus muscles; abdominals 4-/5; decreased activation of lumbar multifidi; trunk extensors 4-/5   LOWER EXTREMITY MMT:  Able to kneel down on floor and get back up with UE assist on chair;  able to sit to stand without UE assist; hip abduction 4/5   FUNCTIONAL TESTS:  5 times sit to stand: 17 Timed up and go (TUG): 12  no UE use 12/16/21: 5x sit to stand 12 sec   GAIT:  Comments: no assistive device;  WFLs    TODAY'S TREATMENT    12/23/21: Nu-step L3 x 8 min old model while discussing status Seated thoracic extension with ball 10x Sit to stand with 2# wts to upright row10x, then biceps curl 10x Vc to maintain TA contraction througout Yellow band rows, ext Uni, Int rot Bil, full ext rot on Rt, not great ROm with Lt (on ER) 5-10 each  Standing at  [place] alt taps without UE support, Vc  to contract core 10x2     10/25: Nu-step L 5 x 7 min new model while discussing status Seated upper trap stretch 3x 30 sec Seated thoracic extension with ball 10x  Sit to stand no UEs 5x Back to wall abdominal draw: toe taps, heel taps, bil UE flexion 5x each Standing at the counter strategies: weight shifting, foot prop on cabinet shelf, ab draw in Yellow band row 10x right/left Yellow band shoulder extension 10x right/left Yellow band internal rotation 10x right/left Therapeutic activities:  standing at the counter; reaching, pushing, pulling      12/16/21: Nu-step L 1 x 10 min old model while discussing status ROM measurements: see above 5x sit to stand see above REVIEW OF GOALS    PATIENT EDUCATION:  Education details: Access Code: [email or web address] Person educated: Patient Education method: Explanation, Demonstration, Tactile cues, Verbal cues, and Handouts Education comprehension: verbalized understanding and returned demonstration   HOME EXERCISE PROGRAM: Access Code: [email or web address] URL: [email or web address] Date: 12/18/2021 Prepared by: Ruben Im  Exercises - Supine Shoulder Flexion Extension AAROM with Dowel  - 1 x daily - 7 x weekly - 3 sets - 10 reps - Seated Figure 4 Piriformis Stretch  - 1 x daily - 7 x weekly - 1 sets - 3 reps - 30 hold - Hooklying Isometric Hip Flexion  - 1 x daily - 7 x weekly - 2 sets - 5 reps - 5 hold - Seated Abdominal Set  - 1 x daily - 7 x weekly - 1 sets - 10 reps - Seated Thoracic Lumbar Extension  - 1 x daily - 7 x weekly - 1 sets - 10 reps - Hooklying Bent Leg Lifts  - 1 x daily - 7 x weekly - 1 sets - 10 reps - Clamshell  - 1 x daily - 7 x weekly - 1 sets - 10 reps - Sidelying Shoulder Abduction Palm Forward (Mirrored)  - 1 x daily - 7 x weekly - 3 sets - 5 reps - Supine Transversus Abdominis Bracing - Hands on Thighs  - 1 x daily - 7 x weekly - 1 sets - 10 reps - serratus punch  - 1 x daily - 7 x weekly - 1 sets - 10  reps - Sidelying Shoulder External Rotation  - 1 x daily - 7 x weekly - 1 sets - 10 reps - Standing Single Arm Row with Resistance Thumb Up (Mirrored)  - 1 x daily - 7 x weekly - 1 sets - 10 reps - Single Arm Shoulder Extension with Anchored Resistance (Mirrored)  - 1 x daily - 7 x weekly - 1 sets - 10 reps - Seated Upper Trapezius Stretch  - 1 x daily - 7 x weekly - 1 sets - 2-3 reps - 30 hold - Shoulder Internal Rotation with Resistance  - 1 x daily - 7 x weekly - 1 sets - 10 reps - Standing with Back Flat Against Wall  - 1 x daily - 7 x weekly - 1 sets - 10 reps    - ASSESSMENT:  CLINICAL IMPRESSION: Pt arrives reporting it is a good day today.Mild shoulder pain. She is enjoying   OBJECTIVE IMPAIRMENTS decreased activity tolerance, difficulty walking, decreased ROM, decreased strength, impaired perceived functional ability, impaired UE functional use, and pain.   ACTIVITY LIMITATIONS carrying, lifting, standing, bathing, dressing, reach over head, and locomotion level  PARTICIPATION LIMITATIONS: meal prep, cleaning, laundry, and community activity  PERSONAL FACTORS Age, Time since onset of injury/illness/exacerbation, and 1-2 comorbidities: multiple body regions affected, peripheral neuropathy, scoliosis, osteoporosis  are also affecting patient's functional outcome.   REHAB POTENTIAL: Good  CLINICAL DECISION MAKING: moderately complex EVALUATION COMPLEXITY: moderate   GOALS: Goals reviewed with patient? Yes  SHORT TERM GOALS: Target date: 12/11/2021  The patient will demonstrate knowledge of basic self care strategies and exercises to promote healing   Baseline: Goal status:Goal met 12/16/21  2.  The patient will report a 30% improvement in back and shoulder pain levels with functional activities including reaching overhead, walking, standing Baseline:  Goal status: On going 20%  3.  The patient will have improved trunk flexor and extensor muscle strength and hip  strength to at least 4/5 needed for lifting medium weight objects such as grocery bags  Baseline:  Goal status: ongoing  4.  The patient will have improved shoulder elevation ROM to at least 122 degrees needed for grooming/dressing purposes as well as reaching high shelves  Baseline:  Goal status: ongoing   LONG TERM GOALS: Target date: 01/08/2022  The patient will be independent in a safe self progression of a home exercise program to promote further recovery of function   Baseline:  Goal status: INITIAL  2.  The patient will report a 65% improvement in back and shoulder pain levels with functional activities which are currently difficult  including walking, standing, reaching overhead Baseline:  Goal status: INITIAL  3.  The patient will have improved shoulder elevation ROM to at least 140 degrees needed for grooming/dressing purposes as well as reaching high shelves  Baseline:  Goal status: INITIAL  4.  The patient will have grossly 4/5 strength needed to lift and lower a 1-3# object from a high shelf  Baseline:  Goal status: INITIAL  5.  The patient will have improved FOTO score to   63%    indicating improved function with less pain  Baseline:  Goal status: INITIAL  6.  5x sit to stand test improved to 15 sec Goal met 12  sec    PLAN: PT FREQUENCY: 2x/week  PT DURATION: 8 weeks  PLANNED INTERVENTIONS: Therapeutic exercises, Therapeutic activity, Neuromuscular re-education, Patient/Family education, Self Care, Joint mobilization, Aquatic Therapy, Dry Needling, Electrical stimulation, Spinal mobilization, Cryotherapy, Moist heat, Traction, Ultrasound, Manual therapy, and Re-evaluation.  PLAN FOR NEXT SESSION: Myrene Galas, PTA 12/23/21 2:38 PM    Ruben Im, PT 12/25/21 7:52 AM Phone: [phone] Fax: [phone]

## 2021-12-25 ENCOUNTER — Ambulatory Visit: Payer: Medicare Other | Attending: Obstetrics & Gynecology | Admitting: Physical Therapy

## 2021-12-25 DIAGNOSIS — M6281 Muscle weakness (generalized): Secondary | ICD-10-CM

## 2021-12-25 DIAGNOSIS — M25512 Pain in left shoulder: Secondary | ICD-10-CM | POA: Diagnosis not present

## 2021-12-25 DIAGNOSIS — M25612 Stiffness of left shoulder, not elsewhere classified: Secondary | ICD-10-CM

## 2021-12-25 DIAGNOSIS — R279 Unspecified lack of coordination: Secondary | ICD-10-CM | POA: Insufficient documentation

## 2021-12-25 DIAGNOSIS — M5459 Other low back pain: Secondary | ICD-10-CM

## 2021-12-25 NOTE — Therapy (Signed)
OUTPATIENT PHYSICAL THERAPY THORACOLUMBAR/SHOULDER   Patient Name: Savannah Diaz MRN: [id number] DOB:04/05/1932, 88 y.o., female Today's Date: 12/25/2021         PT End of Session - 12/25/21 1352     Visit Number 11    Date for PT Re-Evaluation 01/08/22    Authorization Type Medicare    Progress Note Due on Visit 53    PT Start Time 1400    PT Stop Time 1440    PT Time Calculation (min) 40 min    Activity Tolerance Patient tolerated treatment well                  Past Medical History:  Diagnosis Date   Anxiety    Arthritis    "qwhere; but it doesn't hurt" (05/12/2012)   Aspiration pneumonia ([place]) 03/21/2016   Cataract    left   Compression fracture of first lumbar vertebra ([place]) 07/2016   superior endplate   Depression    Dysphagia    "have had it for 7 yr; got worse 4 days ago" (05/12/2012)   Dysphagia, pharyngoesophageal phase - suspected 05/10/2012   GERD (gastroesophageal reflux disease)    Hypertension    Insomnia    Lumbago with sciatica, right side    Migraines    "menopause cured them" (05/12/2012)   Osteoporosis    Stage 3 chronic kidney disease ([place])    Uterine prolapse    Past Surgical History:  Procedure Laterality Date   CATARACT EXTRACTION W/ INTRAOCULAR LENS IMPLANT Left ? 2010   ESOPHAGEAL MANOMETRY N/A 11/08/2018   Procedure: ESOPHAGEAL MANOMETRY (EM);  Surgeon: Gatha Mayer, MD;  Location: [facility];  Service: Endoscopy;  Laterality: N/A;   ESOPHAGOGASTRODUODENOSCOPY N/A 05/10/2012   Procedure: ESOPHAGOGASTRODUODENOSCOPY (EGD);  Surgeon: Jerene Bears, MD;  Location: [facility];  Service: Gastroenterology;  Laterality: N/A;   TONSILLECTOMY  1940's   Patient Active Problem List   Diagnosis Date Noted   High risk medication use 08/08/2016   Influenza A 03/22/2016   Fall 03/21/2016   Acute on chronic kidney failure II-III 03/21/2016   Transient alteration of awareness 05/10/2015   Hypokalemia 05/12/2012   Dysphagia  oropharyngeal and pharyngeal 05/10/2012   Hypertension 04/28/2011   Osteoarthritis 04/28/2011   Agoraphobia 04/28/2011    PCP: Veleta Miners MD  REFERRING PROVIDER: Veleta Miners MD  REFERRING DIAG: M54,50, G89.29 Chronic bil low back pain without sciatica; left shoulder pain  Rationale for Evaluation and Treatment Rehabilitation  THERAPY DIAG:  Back pain; weakness  ONSET DATE: 09/12/2021  SUBJECTIVE:  SUBJECTIVE STATEMENT: I still can't do that one motion (external rotation painful).  Some good days and some not good days.  Wake up in back pain but shower helps.     PERTINENT HISTORY:  Peripheral neuropathy Incontinence bladder/bowel  Right hand dominant;  scoliosis; osteoporosis; 2018 compression fracture L1 when fell backwards  PAIN:  Are you having pain? Yes NPRS scale: 2/10 Pain location: left shoulder - lateral deltoid and upper trap; back hurts at different times   Aggravating factors: mornings; elevating left arm; reaching for a skillet in cabinet;  walking 2,000 steps on pedometer;  prolonged standing Relieving factors: sitting, lying OK  PRECAUTIONS: None  WEIGHT BEARING RESTRICTIONS No  FALLS:  Has patient fallen in last 6 months? No  LIVING ENVIRONMENT: Lives with: lives with their family and [facility] [place]   OCCUPATION: retired   PLOF: Independent with basic ADLs  PATIENT GOALS want to feel better.  Help shoulder pain; stand in the kitchen; not have to lie down after a walk   OBJECTIVE:   DIAGNOSTIC FINDINGS:  None recently  PATIENT SURVEYS:  FOTO shoulder 54%  12/25/21:  FOTO shoulder: 59%   COGNITION:  Overall cognitive status: Within functional limits for tasks assessed      MUSCLE LENGTH: Hamstrings: Right 50 deg; Left 50 deg Thomas test: Right 0  deg; Left 0 deg  POSTURE: rounded shoulders and decreased lumbar lordosis; mild forward trunk   SHOULDER ROM:  RIGHT:  FLEXION:  140   ABDUCTION:   163  EXTERNAL ROTATION:   85  INTERNAL ROTATION: t8                                  LEFT:    FLEXION: 108     ABDUCTION:     65   EXTERNAL ROTATION:   63    INTERNAL ROTATION: L5  10/4:  115 with golf club; supine 112;  seated flexion 125 degrees:  abduction 93;  external rotation 62; internal rotation L1   12/16/21:RT Shoulder: Flexion 148, Abduction 165, Ext Rot 90, Int Rot T8: Left Shoulder: Flexion 108, Abduction 10 slightly out of plane, Ext rot 62, Internal rot L1  12/25/21:  RT Shoulder: Flexion  165, Abduction  165 , Ext Rot 90, Int Rot T8: Left Shoulder: Flexion 120, Abduction  110 slightly out of plane, Ext rot 76, Internal rotT10  SHOULDER STRENGTH :  left:  Flexion:  4-/5;  ABDUCTION 2+/5, external and internal rotation 4/5  LUMBAR ROM:   Active  A/PROM  eval 10/4  Flexion 50 50  Extension 5 8  Right lateral flexion 35 35  Left lateral flexion 35 35  Right rotation    Left rotation     (Blank rows = not tested)  LOWER EXTREMITY ROM:     TRUNK STRENGTH:  Decreased activation of transverse abdominus muscles; abdominals 4-/5; decreased activation of lumbar multifidi; trunk extensors 4-/5   LOWER EXTREMITY MMT:  Able to kneel down on floor and get back up with UE assist on chair;  able to sit to stand without UE assist; hip abduction 4/5   FUNCTIONAL TESTS:  5 times sit to stand: 17 Timed up and go (TUG): 12  no UE use 12/16/21: 5x sit to stand 12 sec   GAIT:  Comments: no assistive device;  WFLs    TODAY'S TREATMENT  12/25/21: Nu-step L 4 x 7 min new model  while discussing status FOTO  Shoulder ROM Supine yellow band external rotation isometric 5 sec hold 10x Supine hand to push isometric 5 sec hold 5x  Sidelying external rotation 10x 5 sec hold (small ROM only) Seated lift bil LES up and over low cone to  activate lower abdominals 10x Sit to stand with left shoulder punch toward ceiling with 1# dumbbell 5x Resisted backward walk 10# cable 8x Standing lat bar pull downs 10# therapist assist to reach bar 10x  Therapeutic activities:  standing at the counter; reaching, pushing, pulling    12/23/21: Nu-step L3 x 8 min old model while discussing status Seated thoracic extension with ball 10x Sit to stand with 2# wts to upright row10x, then biceps curl 10x Vc to maintain TA contraction througout Yellow band rows, ext Uni, Int rot Bil, full ext rot on Rt, not great ROm with Lt (on ER) 5-10 each  Standing at [facility] alt taps without UE support, Vc to contract core 10x2     10/25: Nu-step L 5 x 7 min new model while discussing status Seated upper trap stretch 3x 30 sec Seated thoracic extension with ball 10x  Sit to stand no UEs 5x Back to wall abdominal draw: toe taps, heel taps, bil UE flexion 5x each Standing at the counter strategies: weight shifting, foot prop on cabinet shelf, ab draw in Yellow band row 10x right/left Yellow band shoulder extension 10x right/left Yellow band internal rotation 10x right/left Therapeutic activities:  standing at the counter; reaching, pushing, pulling     PATIENT EDUCATION:  Education details: Access Code: [email or web address] Person educated: Patient Education method: Explanation, Demonstration, Tactile cues, Verbal cues, and Handouts Education comprehension: verbalized understanding and returned demonstration   HOME EXERCISE PROGRAM: Access Code: [email or web address] URL: [email or web address] Date: 12/25/2021 Prepared by: Ruben Im  Exercises - Supine Shoulder Flexion Extension AAROM with Dowel  - 1 x daily - 7 x weekly - 3 sets - 10 reps - Seated Figure 4 Piriformis Stretch  - 1 x daily - 7 x weekly - 1 sets - 3 reps - 30 hold - Hooklying Isometric Hip Flexion  - 1 x daily - 7 x weekly - 2 sets - 5 reps - 5 hold - Seated Abdominal Set  - 1 x  daily - 7 x weekly - 1 sets - 10 reps - Seated Thoracic Lumbar Extension  - 1 x daily - 7 x weekly - 1 sets - 10 reps - Hooklying Bent Leg Lifts  - 1 x daily - 7 x weekly - 1 sets - 10 reps - Clamshell  - 1 x daily - 7 x weekly - 1 sets - 10 reps - Sidelying Shoulder Abduction Palm Forward (Mirrored)  - 1 x daily - 7 x weekly - 3 sets - 5 reps - Supine Transversus Abdominis Bracing - Hands on Thighs  - 1 x daily - 7 x weekly - 1 sets - 10 reps - serratus punch  - 1 x daily - 7 x weekly - 1 sets - 10 reps - Sidelying Shoulder External Rotation  - 1 x daily - 7 x weekly - 1 sets - 10 reps - Standing Single Arm Row with Resistance Thumb Up (Mirrored)  - 1 x daily - 7 x weekly - 1 sets - 10 reps - Single Arm Shoulder Extension with Anchored Resistance (Mirrored)  - 1 x daily - 7 x weekly - 1 sets - 10 reps - Seated Upper Trapezius Stretch  - 1 x  daily - 7 x weekly - 1 sets - 2-3 reps - 30 hold - Shoulder Internal Rotation with Resistance  - 1 x daily - 7 x weekly - 1 sets - 10 reps - Standing with Back Flat Against Wall  - 1 x daily - 7 x weekly - 1 sets - 10 reps - Sidelying Shoulder External Rotation AROM  - 1 x daily - 7 x weekly - 1 sets - 10 reps - Seated Eccentric Abdominal Lean Back  - 1 x daily - 7 x weekly - 1 sets - 10 reps    - ASSESSMENT:  CLINICAL IMPRESSION: Good improvement in FOTO outcome score since start of care.  Shoulder ROM also steadily improving.  She remains highly compliant with her HEP.  Therapist progressing and updating HEP for increased intensity and challenge level for further strengthening and functional mobility.  Added external rotation of the shoulder isometrically or small range of motion without increase in pain.  Progressing with goals.      OBJECTIVE IMPAIRMENTS decreased activity tolerance, difficulty walking, decreased ROM, decreased strength, impaired perceived functional ability, impaired UE functional use, and pain.   ACTIVITY LIMITATIONS carrying,  lifting, standing, bathing, dressing, reach over head, and locomotion level  PARTICIPATION LIMITATIONS: meal prep, cleaning, laundry, and community activity  PERSONAL FACTORS Age, Time since onset of injury/illness/exacerbation, and 1-2 comorbidities: multiple body regions affected, peripheral neuropathy, scoliosis, osteoporosis  are also affecting patient's functional outcome.   REHAB POTENTIAL: Good  CLINICAL DECISION MAKING: moderately complex EVALUATION COMPLEXITY: moderate   GOALS: Goals reviewed with patient? Yes  SHORT TERM GOALS: Target date: 12/11/2021  The patient will demonstrate knowledge of basic self care strategies and exercises to promote healing   Baseline: Goal status:Goal met 12/16/21  2.  The patient will report a 30% improvement in back and shoulder pain levels with functional activities including reaching overhead, walking, standing Baseline:  Goal status: goal met 11/1 3.  The patient will have improved trunk flexor and extensor muscle strength and hip strength to at least 4/5 needed for lifting medium weight objects such as grocery bags  Baseline:  Goal status: goal met 11/1 4.  The patient will have improved shoulder elevation ROM to at least 122 degrees needed for grooming/dressing purposes as well as reaching high shelves  Baseline:  Goal status: goal met 11/1   LONG TERM GOALS: Target date: 01/08/2022  The patient will be independent in a safe self progression of a home exercise program to promote further recovery of function   Baseline:  Goal status: INITIAL  2.  The patient will report a 65% improvement in back and shoulder pain levels with functional activities which are currently difficult  including walking, standing, reaching overhead Baseline:  Goal status: INITIAL  3.  The patient will have improved shoulder elevation ROM to at least 140 degrees needed for grooming/dressing purposes as well as reaching high shelves  Baseline:  Goal  status: INITIAL  4.  The patient will have grossly 4/5 strength needed to lift and lower a 1-3# object from a high shelf  Baseline:  Goal status: INITIAL  5.  The patient will have improved FOTO score to   63%    indicating improved function with less pain  Baseline:  Goal status: INITIAL  6.  5x sit to stand test improved to 15 sec Goal met 12  sec    PLAN: PT FREQUENCY: 2x/week  PT DURATION: 8 weeks  PLANNED INTERVENTIONS: Therapeutic exercises, Therapeutic activity,  Neuromuscular re-education, Patient/Family education, Self Care, Joint mobilization, Aquatic Therapy, Dry Needling, Electrical stimulation, Spinal mobilization, Cryotherapy, Moist heat, Traction, Ultrasound, Manual therapy, and Re-evaluation.  PLAN FOR NEXT SESSION:  Shoulder ROM and strengthening, external rotation isometrically with band or small ROM strengthening, core strengthening, back mobility ex  Ruben Im, PT 12/25/21 4:50 PM Phone: [phone] Fax: [phone]

## 2021-12-30 ENCOUNTER — Ambulatory Visit: Payer: Medicare Other | Admitting: Physical Therapy

## 2021-12-30 ENCOUNTER — Encounter: Payer: Self-pay | Admitting: Physical Therapy

## 2021-12-30 DIAGNOSIS — M6281 Muscle weakness (generalized): Secondary | ICD-10-CM | POA: Diagnosis not present

## 2021-12-30 DIAGNOSIS — M25512 Pain in left shoulder: Secondary | ICD-10-CM

## 2021-12-30 DIAGNOSIS — M25612 Stiffness of left shoulder, not elsewhere classified: Secondary | ICD-10-CM | POA: Diagnosis not present

## 2021-12-30 DIAGNOSIS — R279 Unspecified lack of coordination: Secondary | ICD-10-CM

## 2021-12-30 DIAGNOSIS — M5459 Other low back pain: Secondary | ICD-10-CM

## 2021-12-30 NOTE — Therapy (Signed)
OUTPATIENT PHYSICAL THERAPY THORACOLUMBAR/SHOULDER   Patient Name: Savannah Diaz MRN: [id number] DOB:[birth year], 88 y.o., female Today's Date: 12/30/2021         PT End of Session - 12/30/21 1356     Visit Number 12    Date for PT Re-Evaluation 01/08/22    Authorization Type Medicare    Progress Note Due on Visit 54    PT Start Time 1356    PT Stop Time 1438    PT Time Calculation (min) 42 min    Activity Tolerance Patient tolerated treatment well    Behavior During Therapy [facility] for tasks assessed/performed                   Past Medical History:  Diagnosis Date   Anxiety    Arthritis    "qwhere; but it doesn't hurt" (05/12/2012)   Aspiration pneumonia ([place]) 03/21/2016   Cataract    left   Compression fracture of first lumbar vertebra ([place]) 07/2016   superior endplate   Depression    Dysphagia    "have had it for 7 yr; got worse 4 days ago" (05/12/2012)   Dysphagia, pharyngoesophageal phase - suspected 05/10/2012   GERD (gastroesophageal reflux disease)    Hypertension    Insomnia    Lumbago with sciatica, right side    Migraines    "menopause cured them" (05/12/2012)   Osteoporosis    Stage 3 chronic kidney disease ([place])    Uterine prolapse    Past Surgical History:  Procedure Laterality Date   CATARACT EXTRACTION W/ INTRAOCULAR LENS IMPLANT Left ? 2010   ESOPHAGEAL MANOMETRY N/A 11/08/2018   Procedure: ESOPHAGEAL MANOMETRY (EM);  Surgeon: Gatha Mayer, MD;  Location: [facility];  Service: Endoscopy;  Laterality: N/A;   ESOPHAGOGASTRODUODENOSCOPY N/A 05/10/2012   Procedure: ESOPHAGOGASTRODUODENOSCOPY (EGD);  Surgeon: Jerene Bears, MD;  Location: [facility];  Service: Gastroenterology;  Laterality: N/A;   TONSILLECTOMY  1940's   Patient Active Problem List   Diagnosis Date Noted   High risk medication use 08/08/2016   Influenza A 03/22/2016   Fall 03/21/2016   Acute on chronic kidney failure II-III 03/21/2016   Transient alteration of  awareness 05/10/2015   Hypokalemia 05/12/2012   Dysphagia oropharyngeal and pharyngeal 05/10/2012   Hypertension 04/28/2011   Osteoarthritis 04/28/2011   Agoraphobia 04/28/2011    PCP: Veleta Miners MD  REFERRING PROVIDER: Veleta Miners MD  REFERRING DIAG: M54,50, G89.29 Chronic bil low back pain without sciatica; left shoulder pain  Rationale for Evaluation and Treatment Rehabilitation  THERAPY DIAG:  Back pain; weakness  ONSET DATE: 09/12/2021  SUBJECTIVE:  SUBJECTIVE STATEMENT: No new complaints today. Good days bad days with my shoulder. I felt ok after my last visit.   PERTINENT HISTORY:  Peripheral neuropathy Incontinence bladder/bowel  Right hand dominant;  scoliosis; osteoporosis; 2018 compression fracture L1 when fell backwards  PAIN:  Are you having pain? Yes NPRS scale: 2/10 Pain location: left shoulder - lateral deltoid and upper trap; back hurts at different times   Aggravating factors: mornings; elevating left arm; reaching for a skillet in cabinet;  walking 2,000 steps on pedometer;  prolonged standing Relieving factors: sitting, lying OK  PRECAUTIONS: None  WEIGHT BEARING RESTRICTIONS No  FALLS:  Has patient fallen in last 6 months? No  LIVING ENVIRONMENT: Lives with: lives with their family and [facility] [place]   OCCUPATION: retired   PLOF: Independent with basic ADLs  PATIENT GOALS want to feel better.  Help shoulder pain; stand in the kitchen; not have to lie down after a walk   OBJECTIVE:   DIAGNOSTIC FINDINGS:  None recently  PATIENT SURVEYS:  FOTO shoulder 54%  12/25/21:  FOTO shoulder: 59%   COGNITION:  Overall cognitive status: Within functional limits for tasks assessed      MUSCLE LENGTH: Hamstrings: Right 50 deg; Left 50 deg Thomas test:  Right 0 deg; Left 0 deg  POSTURE: rounded shoulders and decreased lumbar lordosis; mild forward trunk   SHOULDER ROM:  RIGHT:  FLEXION:  140   ABDUCTION:   163  EXTERNAL ROTATION:   85  INTERNAL ROTATION: t8                                  LEFT:    FLEXION: 108     ABDUCTION:     65   EXTERNAL ROTATION:   63    INTERNAL ROTATION: L5  10/4:  115 with golf club; supine 112;  seated flexion 125 degrees:  abduction 93;  external rotation 62; internal rotation L1   12/16/21:RT Shoulder: Flexion 148, Abduction 165, Ext Rot 90, Int Rot T8: Left Shoulder: Flexion 108, Abduction 10 slightly out of plane, Ext rot 62, Internal rot L1  12/25/21:  RT Shoulder: Flexion  165, Abduction  165 , Ext Rot 90, Int Rot T8: Left Shoulder: Flexion 120, Abduction  110 slightly out of plane, Ext rot 76, Internal rotT10  SHOULDER STRENGTH :  left:  Flexion:  4-/5;  ABDUCTION 2+/5, external and internal rotation 4/5  LUMBAR ROM:   Active  A/PROM  eval 10/4  Flexion 50 50  Extension 5 8  Right lateral flexion 35 35  Left lateral flexion 35 35  Right rotation    Left rotation     (Blank rows = not tested)  LOWER EXTREMITY ROM:     TRUNK STRENGTH:  Decreased activation of transverse abdominus muscles; abdominals 4-/5; decreased activation of lumbar multifidi; trunk extensors 4-/5   LOWER EXTREMITY MMT:  Able to kneel down on floor and get back up with UE assist on chair;  able to sit to stand without UE assist; hip abduction 4/5   FUNCTIONAL TESTS:  5 times sit to stand: 17 Timed up and go (TUG): 12  no UE use 12/16/21: 5x sit to stand 12 sec   GAIT:  Comments: no assistive device;  WFLs    TODAY'S TREATMENT   12/30/21: Nu-step L 4 x 7 min new model while discussing status Supine yellow band external rotation isometric 5 sec  hold 10x Supine hand to push isometric 5 sec hold 10x  bil Sidelying external rotation 10x 5 sec hold (small ROM but seems more than last time: only LT) then 1# 10x RT Sit  to stand with left shoulder punch toward ceiling with 1# dumbbell 5x2 Resisted backward walk 10# cable 10x: VC to relax LT shoulder Arm bike: L1.5 2x2 PTA present to montior   12/25/21: Nu-step L 4 x 7 min new model while discussing status FOTO  Shoulder ROM Supine yellow band external rotation isometric 5 sec hold 10x Supine hand to push isometric 5 sec hold 5x  Sidelying external rotation 10x 5 sec hold (small ROM only) Seated lift bil LES up and over low cone to activate lower abdominals 10x Sit to stand with left shoulder punch toward ceiling with 1# dumbbell 5x Resisted backward walk 10# cable 8x Standing lat bar pull downs 10# therapist assist to reach bar 10x  Therapeutic activities:  standing at the counter; reaching, pushing, pulling    12/23/21: Nu-step L3 x 8 min old model while discussing status Seated thoracic extension with ball 10x Sit to stand with 2# wts to upright row10x, then biceps curl 10x Vc to maintain TA contraction througout Yellow band rows, ext Uni, Int rot Bil, full ext rot on Rt, not great ROm with Lt (on ER) 5-10 each  Standing at [facility] alt taps without UE support, Vc to contract core 10x2     PATIENT EDUCATION:  Education details: Access Code: [email or web address] Person educated: Patient Education method: Explanation, Demonstration, Tactile cues, Verbal cues, and Handouts Education comprehension: verbalized understanding and returned demonstration   HOME EXERCISE PROGRAM: Access Code: [email or web address] URL: [email or web address] Date: 12/25/2021 Prepared by: Ruben Im  Exercises - Supine Shoulder Flexion Extension AAROM with Dowel  - 1 x daily - 7 x weekly - 3 sets - 10 reps - Seated Figure 4 Piriformis Stretch  - 1 x daily - 7 x weekly - 1 sets - 3 reps - 30 hold - Hooklying Isometric Hip Flexion  - 1 x daily - 7 x weekly - 2 sets - 5 reps - 5 hold - Seated Abdominal Set  - 1 x daily - 7 x weekly - 1 sets - 10 reps - Seated Thoracic Lumbar  Extension  - 1 x daily - 7 x weekly - 1 sets - 10 reps - Hooklying Bent Leg Lifts  - 1 x daily - 7 x weekly - 1 sets - 10 reps - Clamshell  - 1 x daily - 7 x weekly - 1 sets - 10 reps - Sidelying Shoulder Abduction Palm Forward (Mirrored)  - 1 x daily - 7 x weekly - 3 sets - 5 reps - Supine Transversus Abdominis Bracing - Hands on Thighs  - 1 x daily - 7 x weekly - 1 sets - 10 reps - serratus punch  - 1 x daily - 7 x weekly - 1 sets - 10 reps - Sidelying Shoulder External Rotation  - 1 x daily - 7 x weekly - 1 sets - 10 reps - Standing Single Arm Row with Resistance Thumb Up (Mirrored)  - 1 x daily - 7 x weekly - 1 sets - 10 reps - Single Arm Shoulder Extension with Anchored Resistance (Mirrored)  - 1 x daily - 7 x weekly - 1 sets - 10 reps - Seated Upper Trapezius Stretch  - 1 x daily - 7 x weekly - 1 sets - 2-3 reps - 30 hold -  Shoulder Internal Rotation with Resistance  - 1 x daily - 7 x weekly - 1 sets - 10 reps - Standing with Back Flat Against Wall  - 1 x daily - 7 x weekly - 1 sets - 10 reps - Sidelying Shoulder External Rotation AROM  - 1 x daily - 7 x weekly - 1 sets - 10 reps - Seated Eccentric Abdominal Lean Back  - 1 x daily - 7 x weekly - 1 sets - 10 reps    - ASSESSMENT:  CLINICAL IMPRESSION: Pt arrives with no new complaints and reports she tolerated all exercises very well last session. Pt thinks her standing tolerance is slightly better; 10%-20%. Some additional reps or sets were added to todays exercises. Pt demonstrated good ROM with S/L Lt shoulder ER.   OBJECTIVE IMPAIRMENTS decreased activity tolerance, difficulty walking, decreased ROM, decreased strength, impaired perceived functional ability, impaired UE functional use, and pain.   ACTIVITY LIMITATIONS carrying, lifting, standing, bathing, dressing, reach over head, and locomotion level  PARTICIPATION LIMITATIONS: meal prep, cleaning, laundry, and community activity  PERSONAL FACTORS Age, Time since onset of  injury/illness/exacerbation, and 1-2 comorbidities: multiple body regions affected, peripheral neuropathy, scoliosis, osteoporosis  are also affecting patient's functional outcome.   REHAB POTENTIAL: Good  CLINICAL DECISION MAKING: moderately complex EVALUATION COMPLEXITY: moderate   GOALS: Goals reviewed with patient? Yes  SHORT TERM GOALS: Target date: 12/11/2021  The patient will demonstrate knowledge of basic self care strategies and exercises to promote healing   Baseline: Goal status:Goal met 12/16/21  2.  The patient will report a 30% improvement in back and shoulder pain levels with functional activities including reaching overhead, walking, standing Baseline:  Goal status: goal met 11/1 3.  The patient will have improved trunk flexor and extensor muscle strength and hip strength to at least 4/5 needed for lifting medium weight objects such as grocery bags  Baseline:  Goal status: goal met 11/1 4.  The patient will have improved shoulder elevation ROM to at least 122 degrees needed for grooming/dressing purposes as well as reaching high shelves  Baseline:  Goal status: goal met 11/1   LONG TERM GOALS: Target date: 01/08/2022  The patient will be independent in a safe self progression of a home exercise program to promote further recovery of function   Baseline:  Goal status: INITIAL  2.  The patient will report a 65% improvement in back and shoulder pain levels with functional activities which are currently difficult  including walking, standing, reaching overhead Baseline:  Goal status: INITIAL  3.  The patient will have improved shoulder elevation ROM to at least 140 degrees needed for grooming/dressing purposes as well as reaching high shelves  Baseline:  Goal status: INITIAL  4.  The patient will have grossly 4/5 strength needed to lift and lower a 1-3# object from a high shelf  Baseline:  Goal status: INITIAL  5.  The patient will have improved FOTO score to    63%    indicating improved function with less pain  Baseline:  Goal status: INITIAL  6.  5x sit to stand test improved to 15 sec Goal met 12  sec    PLAN: PT FREQUENCY: 2x/week  PT DURATION: 8 weeks  PLANNED INTERVENTIONS: Therapeutic exercises, Therapeutic activity, Neuromuscular re-education, Patient/Family education, Self Care, Joint mobilization, Aquatic Therapy, Dry Needling, Electrical stimulation, Spinal mobilization, Cryotherapy, Moist heat, Traction, Ultrasound, Manual therapy, and Re-evaluation.  PLAN FOR NEXT SESSION:  Shoulder ROM and strengthening, external rotation  isometrically with band or small ROM strengthening, core strengthening, back mobility ex  Myrene Galas, PTA 12/30/21 2:36 PM

## 2022-01-01 ENCOUNTER — Ambulatory Visit: Payer: Medicare Other | Admitting: Physical Therapy

## 2022-01-01 DIAGNOSIS — M5459 Other low back pain: Secondary | ICD-10-CM | POA: Diagnosis not present

## 2022-01-01 DIAGNOSIS — M25612 Stiffness of left shoulder, not elsewhere classified: Secondary | ICD-10-CM

## 2022-01-01 DIAGNOSIS — R279 Unspecified lack of coordination: Secondary | ICD-10-CM | POA: Diagnosis not present

## 2022-01-01 DIAGNOSIS — M6281 Muscle weakness (generalized): Secondary | ICD-10-CM

## 2022-01-01 DIAGNOSIS — M25512 Pain in left shoulder: Secondary | ICD-10-CM | POA: Diagnosis not present

## 2022-01-01 NOTE — Therapy (Signed)
OUTPATIENT PHYSICAL THERAPY THORACOLUMBAR/SHOULDER   Patient Name: Savannah Diaz MRN: [id number] DOB:02-01-1933, 88 y.o., female Today's Date: 01/01/2022         PT End of Session - 01/01/22 1400     Visit Number 13    Date for PT Re-Evaluation 01/08/22    Authorization Type Medicare    Progress Note Due on Visit 32    PT Start Time 1400    PT Stop Time 1440    PT Time Calculation (min) 40 min    Activity Tolerance Patient tolerated treatment well                   Past Medical History:  Diagnosis Date   Anxiety    Arthritis    "qwhere; but it doesn't hurt" (05/12/2012)   Aspiration pneumonia ([place]) 03/21/2016   Cataract    left   Compression fracture of first lumbar vertebra ([place]) 07/2016   superior endplate   Depression    Dysphagia    "have had it for 7 yr; got worse 4 days ago" (05/12/2012)   Dysphagia, pharyngoesophageal phase - suspected 05/10/2012   GERD (gastroesophageal reflux disease)    Hypertension    Insomnia    Lumbago with sciatica, right side    Migraines    "menopause cured them" (05/12/2012)   Osteoporosis    Stage 3 chronic kidney disease ([place])    Uterine prolapse    Past Surgical History:  Procedure Laterality Date   CATARACT EXTRACTION W/ INTRAOCULAR LENS IMPLANT Left ? 2010   ESOPHAGEAL MANOMETRY N/A 11/08/2018   Procedure: ESOPHAGEAL MANOMETRY (EM);  Surgeon: Gatha Mayer, MD;  Location: [facility];  Service: Endoscopy;  Laterality: N/A;   ESOPHAGOGASTRODUODENOSCOPY N/A 05/10/2012   Procedure: ESOPHAGOGASTRODUODENOSCOPY (EGD);  Surgeon: Jerene Bears, MD;  Location: [facility];  Service: Gastroenterology;  Laterality: N/A;   TONSILLECTOMY  1940's   Patient Active Problem List   Diagnosis Date Noted   High risk medication use 08/08/2016   Influenza A 03/22/2016   Fall 03/21/2016   Acute on chronic kidney failure II-III 03/21/2016   Transient alteration of awareness 05/10/2015   Hypokalemia 05/12/2012   Dysphagia  oropharyngeal and pharyngeal 05/10/2012   Hypertension 04/28/2011   Osteoarthritis 04/28/2011   Agoraphobia 04/28/2011    PCP: Veleta Miners MD  REFERRING PROVIDER: Veleta Miners MD  REFERRING DIAG: M54,50, G89.29 Chronic bil low back pain without sciatica; left shoulder pain  Rationale for Evaluation and Treatment Rehabilitation  THERAPY DIAG:  Back pain; weakness  ONSET DATE: 09/12/2021  SUBJECTIVE:  SUBJECTIVE STATEMENT: I'm tired today.  My back and shoulder hurt.  I did 10 minutes on the Nu-Step yesterday.  I need to work on my "wings" (shoulder blade muscles).  PERTINENT HISTORY:  Peripheral neuropathy Incontinence bladder/bowel  Right hand dominant;  scoliosis; osteoporosis; 2018 compression fracture L1 when fell backwards  PAIN:  Are you having pain? Yes NPRS scale: 2/10 Pain location: left shoulder - lateral deltoid and upper trap; back hurts at different times   Aggravating factors: mornings; elevating left arm; reaching for a skillet in cabinet;  walking 2,000 steps on pedometer;  prolonged standing Relieving factors: sitting, lying OK  PRECAUTIONS: None  WEIGHT BEARING RESTRICTIONS No  FALLS:  Has patient fallen in last 6 months? No  LIVING ENVIRONMENT: Lives with: lives with their family and [facility] [place]   OCCUPATION: retired   PLOF: Independent with basic ADLs  PATIENT GOALS want to feel better.  Help shoulder pain; stand in the kitchen; not have to lie down after a walk   OBJECTIVE:   DIAGNOSTIC FINDINGS:  None recently  PATIENT SURVEYS:  FOTO shoulder 54%  12/25/21:  FOTO shoulder: 59%   COGNITION:  Overall cognitive status: Within functional limits for tasks assessed      MUSCLE LENGTH: Hamstrings: Right 50 deg; Left 50 deg Thomas test: Right 0  deg; Left 0 deg  POSTURE: rounded shoulders and decreased lumbar lordosis; mild forward trunk   SHOULDER ROM:  RIGHT:  FLEXION:  140   ABDUCTION:   163  EXTERNAL ROTATION:   85  INTERNAL ROTATION: t8                                  LEFT:    FLEXION: 108     ABDUCTION:     65   EXTERNAL ROTATION:   63    INTERNAL ROTATION: L5  10/4:  115 with golf club; supine 112;  seated flexion 125 degrees:  abduction 93;  external rotation 62; internal rotation L1   12/16/21:RT Shoulder: Flexion 148, Abduction 165, Ext Rot 90, Int Rot T8: Left Shoulder: Flexion 108, Abduction 10 slightly out of plane, Ext rot 62, Internal rot L1  12/25/21:  RT Shoulder: Flexion  165, Abduction  165 , Ext Rot 90, Int Rot T8: Left Shoulder: Flexion 120, Abduction  110 slightly out of plane, Ext rot 76, Internal rotT10  SHOULDER STRENGTH :  left:  Flexion:  4-/5;  ABDUCTION 2+/5, external and internal rotation 4/5  LUMBAR ROM:   Active  A/PROM  eval 10/4  Flexion 50 50  Extension 5 8  Right lateral flexion 35 35  Left lateral flexion 35 35  Right rotation    Left rotation     (Blank rows = not tested)  LOWER EXTREMITY ROM:     TRUNK STRENGTH:  Decreased activation of transverse abdominus muscles; abdominals 4-/5; decreased activation of lumbar multifidi; trunk extensors 4-/5   LOWER EXTREMITY MMT:  Able to kneel down on floor and get back up with UE assist on chair;  able to sit to stand without UE assist; hip abduction 4/5   FUNCTIONAL TESTS:  5 times sit to stand: 17 Timed up and go (TUG): 12  no UE use 12/16/21: 5x sit to stand 12 sec   GAIT:  Comments: no assistive device;  WFLs    TODAY'S TREATMENT  01/01/22: Nu-step L 4 x 6 min new model while discussing  status Supine yellow band slight tension while elevating 5x Supine yellow band horizontal abduction 5x  Supine yellow band external rotation isometric 5 sec hold 10x Supine yellow band diagonals 5x each way Sidelying external rotation  60%  ROM 1# 10x right/left  Towel slides on wall 5th fingers against wall elevation 5x; Wall push ups plus 10x Arm bike: L1.5 2x2 PT present to montior  12/30/21: Nu-step L 4 x 7 min new model while discussing status Supine yellow band external rotation isometric 5 sec hold 10x Supine hand to push isometric 5 sec hold 10x  bil Sidelying external rotation 10x 5 sec hold (small ROM but seems more than last time: only LT) then 1# 10x RT Sit to stand with left shoulder punch toward ceiling with 1# dumbbell 5x2 Resisted backward walk 10# cable 10x: VC to relax LT shoulder Arm bike: L1.5 2x2 PTA present to montior   12/25/21: Nu-step L 4 x 7 min new model while discussing status FOTO  Shoulder ROM Supine yellow band external rotation isometric 5 sec hold 10x Supine hand to push isometric 5 sec hold 5x  Sidelying external rotation 10x 5 sec hold (small ROM only) Seated lift bil LES up and over low cone to activate lower abdominals 10x Sit to stand with left shoulder punch toward ceiling with 1# dumbbell 5x Resisted backward walk 10# cable 8x Standing lat bar pull downs 10# therapist assist to reach bar 10x  Therapeutic activities:  standing at the counter; reaching, pushing, pulling     PATIENT EDUCATION:  Education details: Access Code: [email or web address] Person educated: Patient Education method: Explanation, Demonstration, Tactile cues, Verbal cues, and Handouts Education comprehension: verbalized understanding and returned demonstration   HOME EXERCISE PROGRAM: Access Code: [email or web address] URL: [email or web address] Date: 01/01/2022 Prepared by: Ruben Im  Exercises - Supine Shoulder Flexion Extension AAROM with Dowel  - 1 x daily - 7 x weekly - 3 sets - 10 reps - Seated Figure 4 Piriformis Stretch  - 1 x daily - 7 x weekly - 1 sets - 3 reps - 30 hold - Hooklying Isometric Hip Flexion  - 1 x daily - 7 x weekly - 2 sets - 5 reps - 5 hold - Seated Abdominal Set  - 1 x daily - 7 x  weekly - 1 sets - 10 reps - Seated Thoracic Lumbar Extension  - 1 x daily - 7 x weekly - 1 sets - 10 reps - Hooklying Bent Leg Lifts  - 1 x daily - 7 x weekly - 1 sets - 10 reps - Clamshell  - 1 x daily - 7 x weekly - 1 sets - 10 reps - Sidelying Shoulder Abduction Palm Forward (Mirrored)  - 1 x daily - 7 x weekly - 3 sets - 5 reps - Supine Transversus Abdominis Bracing - Hands on Thighs  - 1 x daily - 7 x weekly - 1 sets - 10 reps - serratus punch  - 1 x daily - 7 x weekly - 1 sets - 10 reps - Sidelying Shoulder External Rotation  - 1 x daily - 7 x weekly - 1 sets - 10 reps - Standing Single Arm Row with Resistance Thumb Up (Mirrored)  - 1 x daily - 7 x weekly - 1 sets - 10 reps - Single Arm Shoulder Extension with Anchored Resistance (Mirrored)  - 1 x daily - 7 x weekly - 1 sets - 10 reps - Seated Upper Trapezius Stretch  - 1 x daily - 7  x weekly - 1 sets - 2-3 reps - 30 hold - Shoulder Internal Rotation with Resistance  - 1 x daily - 7 x weekly - 1 sets - 10 reps - Standing with Back Flat Against Wall  - 1 x daily - 7 x weekly - 1 sets - 10 reps - Sidelying Shoulder External Rotation AROM  - 1 x daily - 7 x weekly - 1 sets - 10 reps - Seated Eccentric Abdominal Lean Back  - 1 x daily - 7 x weekly - 1 sets - 10 reps - Supine Shoulder Horizontal Abduction with Resistance  - 1 x daily - 7 x weekly - 1 sets - 5 reps - Supine Shoulder External Rotation with Resistance  - 1 x daily - 7 x weekly - 3 sets - 5 reps ASSESSMENT:  CLINICAL IMPRESSION: Updated/progressed HEP to include scapular strengthening ex's.  Discussed general exercise/physical activity recommendations 30-45 min each day.  Therapist monitoring response and providing verbal cues to optimize muscle activation.     OBJECTIVE IMPAIRMENTS decreased activity tolerance, difficulty walking, decreased ROM, decreased strength, impaired perceived functional ability, impaired UE functional use, and pain.   ACTIVITY LIMITATIONS carrying,  lifting, standing, bathing, dressing, reach over head, and locomotion level  PARTICIPATION LIMITATIONS: meal prep, cleaning, laundry, and community activity  PERSONAL FACTORS Age, Time since onset of injury/illness/exacerbation, and 1-2 comorbidities: multiple body regions affected, peripheral neuropathy, scoliosis, osteoporosis  are also affecting patient's functional outcome.   REHAB POTENTIAL: Good  CLINICAL DECISION MAKING: moderately complex EVALUATION COMPLEXITY: moderate   GOALS: Goals reviewed with patient? Yes  SHORT TERM GOALS: Target date: 12/11/2021  The patient will demonstrate knowledge of basic self care strategies and exercises to promote healing   Baseline: Goal status:Goal met 12/16/21  2.  The patient will report a 30% improvement in back and shoulder pain levels with functional activities including reaching overhead, walking, standing Baseline:  Goal status: goal met 11/1 3.  The patient will have improved trunk flexor and extensor muscle strength and hip strength to at least 4/5 needed for lifting medium weight objects such as grocery bags  Baseline:  Goal status: goal met 11/1 4.  The patient will have improved shoulder elevation ROM to at least 122 degrees needed for grooming/dressing purposes as well as reaching high shelves  Baseline:  Goal status: goal met 11/1   LONG TERM GOALS: Target date: 01/08/2022  The patient will be independent in a safe self progression of a home exercise program to promote further recovery of function   Baseline:  Goal status: ongoing  2.  The patient will report a 65% improvement in back and shoulder pain levels with functional activities which are currently difficult  including walking, standing, reaching overhead Baseline:  Goal status: ongoing  3.  The patient will have improved shoulder elevation ROM to at least 140 degrees needed for grooming/dressing purposes as well as reaching high shelves  Baseline:  Goal  status: ongoing  4.  The patient will have grossly 4/5 strength needed to lift and lower a 1-3# object from a high shelf  Baseline:  Goal status: ongoing  5.  The patient will have improved FOTO score to   63%    indicating improved function with less pain  Baseline:  Goal status: ongoing  6.  5x sit to stand test improved to 15 sec Goal met 12  sec    PLAN: PT FREQUENCY: 2x/week  PT DURATION: 8 weeks  PLANNED INTERVENTIONS: Therapeutic exercises,  Therapeutic activity, Neuromuscular re-education, Patient/Family education, Self Care, Joint mobilization, Aquatic Therapy, Dry Needling, Electrical stimulation, Spinal mobilization, Cryotherapy, Moist heat, Traction, Ultrasound, Manual therapy, and Re-evaluation.  PLAN FOR NEXT SESSION:  Shoulder ROM and strengthening, external rotation isometrically with band or small ROM strengthening, core strengthening, back mobility ex  Ruben Im, PT 01/01/22 2:43 PM Phone: [phone] Fax: [phone]

## 2022-01-02 ENCOUNTER — Other Ambulatory Visit: Payer: Self-pay | Admitting: Internal Medicine

## 2022-01-02 NOTE — Telephone Encounter (Signed)
High risk or very high risk warning populated when attempting to refill medication. RX request sent to PCP for review and approval if warranted.   

## 2022-01-02 NOTE — Telephone Encounter (Signed)
Patient has request refill on medication Mirtazapine. Medication last refilled August 2023. Patient medication has many High Risk Warnings. Medications pend and sent to PCP Virgie Dad, MD for approval.

## 2022-01-06 ENCOUNTER — Ambulatory Visit: Payer: Medicare Other | Admitting: Physical Therapy

## 2022-01-06 ENCOUNTER — Encounter: Payer: Self-pay | Admitting: Physical Therapy

## 2022-01-06 DIAGNOSIS — M5459 Other low back pain: Secondary | ICD-10-CM

## 2022-01-06 DIAGNOSIS — M25512 Pain in left shoulder: Secondary | ICD-10-CM

## 2022-01-06 DIAGNOSIS — R279 Unspecified lack of coordination: Secondary | ICD-10-CM

## 2022-01-06 DIAGNOSIS — M6281 Muscle weakness (generalized): Secondary | ICD-10-CM

## 2022-01-06 DIAGNOSIS — M25612 Stiffness of left shoulder, not elsewhere classified: Secondary | ICD-10-CM | POA: Diagnosis not present

## 2022-01-06 NOTE — Therapy (Signed)
OUTPATIENT PHYSICAL THERAPY THORACOLUMBAR/SHOULDER   Patient Name: Savannah Diaz MRN: [id number] DOB:Oct 23, 1932, 88 y.o., female Today's Date: 01/06/2022         PT End of Session - 01/06/22 1400     Visit Number 14    Date for PT Re-Evaluation 01/08/22    Authorization Type Medicare    Progress Note Due on Visit 26    PT Start Time 1355    PT Stop Time 1435    PT Time Calculation (min) 40 min    Activity Tolerance Patient tolerated treatment well    Behavior During Therapy [facility] for tasks assessed/performed                    Past Medical History:  Diagnosis Date   Anxiety    Arthritis    "qwhere; but it doesn't hurt" (05/12/2012)   Aspiration pneumonia ([place]) 03/21/2016   Cataract    left   Compression fracture of first lumbar vertebra ([place]) 07/2016   superior endplate   Depression    Dysphagia    "have had it for 7 yr; got worse 4 days ago" (05/12/2012)   Dysphagia, pharyngoesophageal phase - suspected 05/10/2012   GERD (gastroesophageal reflux disease)    Hypertension    Insomnia    Lumbago with sciatica, right side    Migraines    "menopause cured them" (05/12/2012)   Osteoporosis    Stage 3 chronic kidney disease ([place])    Uterine prolapse    Past Surgical History:  Procedure Laterality Date   CATARACT EXTRACTION W/ INTRAOCULAR LENS IMPLANT Left ? 2010   ESOPHAGEAL MANOMETRY N/A 11/08/2018   Procedure: ESOPHAGEAL MANOMETRY (EM);  Surgeon: Gatha Mayer, MD;  Location: [facility];  Service: Endoscopy;  Laterality: N/A;   ESOPHAGOGASTRODUODENOSCOPY N/A 05/10/2012   Procedure: ESOPHAGOGASTRODUODENOSCOPY (EGD);  Surgeon: Jerene Bears, MD;  Location: [facility];  Service: Gastroenterology;  Laterality: N/A;   TONSILLECTOMY  1940's   Patient Active Problem List   Diagnosis Date Noted   High risk medication use 08/08/2016   Influenza A 03/22/2016   Fall 03/21/2016   Acute on chronic kidney failure II-III 03/21/2016   Transient alteration of  awareness 05/10/2015   Hypokalemia 05/12/2012   Dysphagia oropharyngeal and pharyngeal 05/10/2012   Hypertension 04/28/2011   Osteoarthritis 04/28/2011   Agoraphobia 04/28/2011    PCP: Veleta Miners MD  REFERRING PROVIDER: Veleta Miners MD  REFERRING DIAG: M54,50, G89.29 Chronic bil low back pain without sciatica; left shoulder pain  Rationale for Evaluation and Treatment Rehabilitation  THERAPY DIAG:  Back pain; weakness  ONSET DATE: 09/12/2021  SUBJECTIVE:  SUBJECTIVE STATEMENT: I am waking up every morning with pain in my shoulders until I take a hot shower then it is better. I don't think I hurt like that before PT. I have been trying to stick with 30-45 minutes of total exercise daily.    PERTINENT HISTORY:  Peripheral neuropathy Incontinence bladder/bowel  Right hand dominant;  scoliosis; osteoporosis; 2018 compression fracture L1 when fell backwards  PAIN:  Are you having pain? Yes NPRS scale: 2/10 Pain location: left shoulder - lateral deltoid and upper trap; back hurts at different times   Aggravating factors: mornings; elevating left arm; reaching for a skillet in cabinet;  walking 2,000 steps on pedometer;  prolonged standing Relieving factors: sitting, lying OK  PRECAUTIONS: None  WEIGHT BEARING RESTRICTIONS No  FALLS:  Has patient fallen in last 6 months? No  LIVING ENVIRONMENT: Lives with: lives with their family and [facility] [place]   OCCUPATION: retired   PLOF: Independent with basic ADLs  PATIENT GOALS want to feel better.  Help shoulder pain; stand in the kitchen; not have to lie down after a walk   OBJECTIVE:   DIAGNOSTIC FINDINGS:  None recently  PATIENT SURVEYS:  FOTO shoulder 54%  12/25/21:  FOTO shoulder: 59%   COGNITION:  Overall cognitive  status: Within functional limits for tasks assessed      MUSCLE LENGTH: Hamstrings: Right 50 deg; Left 50 deg Thomas test: Right 0 deg; Left 0 deg  POSTURE: rounded shoulders and decreased lumbar lordosis; mild forward trunk   SHOULDER ROM:  RIGHT:  FLEXION:  140   ABDUCTION:   163  EXTERNAL ROTATION:   85  INTERNAL ROTATION: t8                                  LEFT:    FLEXION: 108     ABDUCTION:     65   EXTERNAL ROTATION:   63    INTERNAL ROTATION: L5  10/4:  115 with golf club; supine 112;  seated flexion 125 degrees:  abduction 93;  external rotation 62; internal rotation L1   12/16/21:RT Shoulder: Flexion 148, Abduction 165, Ext Rot 90, Int Rot T8: Left Shoulder: Flexion 108, Abduction 10 slightly out of plane, Ext rot 62, Internal rot L1  12/25/21:  RT Shoulder: Flexion  165, Abduction  165 , Ext Rot 90, Int Rot T8: Left Shoulder: Flexion 120, Abduction  110 slightly out of plane, Ext rot 76, Internal rotT10  SHOULDER STRENGTH :  left:  Flexion:  4-/5;  ABDUCTION 2+/5, external and internal rotation 4/5  LUMBAR ROM:   Active  A/PROM  eval 10/4  Flexion 50 50  Extension 5 8  Right lateral flexion 35 35  Left lateral flexion 35 35  Right rotation    Left rotation     (Blank rows = not tested)  LOWER EXTREMITY ROM:     TRUNK STRENGTH:  Decreased activation of transverse abdominus muscles; abdominals 4-/5; decreased activation of lumbar multifidi; trunk extensors 4-/5   LOWER EXTREMITY MMT:  Able to kneel down on floor and get back up with UE assist on chair;  able to sit to stand without UE assist; hip abduction 4/5   FUNCTIONAL TESTS:  5 times sit to stand: 17 Timed up and go (TUG): 12  no UE use 12/16/21: 5x sit to stand 12 sec   GAIT:  Comments: no assistive device;  WFLs  TODAY'S TREATMENT   01/06/22: Nu-step L1 x 10 min nold model while discussing status and pt's concern as to why her shoulder have hurt more this past wee and an overall confusion on  what she should or should not do when she hurts. We reduced resistance secondary to RT knee pain complaint.  Supine stick overhead 10x AAROM Supine green loop isometric in nature press into Ext rot 2 sec hold 5x Supine pushups with stick 10x Single arm slides up wall 10x Rt, Lt 5x with arm pain. Stopped exercise.PTA suggested no LTUE exercise until Wednesday to give it a rest until re-assesment. Pt agreed and we completed todays sessison.      01/01/22: Nu-step L 4 x 6 min new model while discussing status Supine yellow band slight tension while elevating 5x Supine yellow band horizontal abduction 5x  Supine yellow band external rotation isometric 5 sec hold 10x Supine yellow band diagonals 5x each way Sidelying external rotation  60% ROM 1# 10x right/left  Towel slides on wall 5th fingers against wall elevation 5x; Wall push ups plus 10x Arm bike: L1.5 2x2 PT present to montior  12/30/21: Nu-step L 4 x 7 min new model while discussing status Supine yellow band external rotation isometric 5 sec hold 10x Supine hand to push isometric 5 sec hold 10x  bil Sidelying external rotation 10x 5 sec hold (small ROM but seems more than last time: only LT) then 1# 10x RT Sit to stand with left shoulder punch toward ceiling with 1# dumbbell 5x2 Resisted backward walk 10# cable 10x: VC to relax LT shoulder Arm bike: L1.5 2x2 PTA present to montior   PATIENT EDUCATION:  Education details: Access Code: [email or web address] Person educated: Patient Education method: Explanation, Demonstration, Tactile cues, Verbal cues, and Handouts Education comprehension: verbalized understanding and returned demonstration   HOME EXERCISE PROGRAM: Access Code: [email or web address] URL: [email or web address] Date: 01/01/2022 Prepared by: Ruben Im  Exercises - Supine Shoulder Flexion Extension AAROM with Dowel  - 1 x daily - 7 x weekly - 3 sets - 10 reps - Seated Figure 4 Piriformis Stretch  - 1 x daily - 7 x  weekly - 1 sets - 3 reps - 30 hold - Hooklying Isometric Hip Flexion  - 1 x daily - 7 x weekly - 2 sets - 5 reps - 5 hold - Seated Abdominal Set  - 1 x daily - 7 x weekly - 1 sets - 10 reps - Seated Thoracic Lumbar Extension  - 1 x daily - 7 x weekly - 1 sets - 10 reps - Hooklying Bent Leg Lifts  - 1 x daily - 7 x weekly - 1 sets - 10 reps - Clamshell  - 1 x daily - 7 x weekly - 1 sets - 10 reps - Sidelying Shoulder Abduction Palm Forward (Mirrored)  - 1 x daily - 7 x weekly - 3 sets - 5 reps - Supine Transversus Abdominis Bracing - Hands on Thighs  - 1 x daily - 7 x weekly - 1 sets - 10 reps - serratus punch  - 1 x daily - 7 x weekly - 1 sets - 10 reps - Sidelying Shoulder External Rotation  - 1 x daily - 7 x weekly - 1 sets - 10 reps - Standing Single Arm Row with Resistance Thumb Up (Mirrored)  - 1 x daily - 7 x weekly - 1 sets - 10 reps - Single Arm Shoulder Extension with Anchored Resistance (Mirrored)  - 1 x daily -  7 x weekly - 1 sets - 10 reps - Seated Upper Trapezius Stretch  - 1 x daily - 7 x weekly - 1 sets - 2-3 reps - 30 hold - Shoulder Internal Rotation with Resistance  - 1 x daily - 7 x weekly - 1 sets - 10 reps - Standing with Back Flat Against Wall  - 1 x daily - 7 x weekly - 1 sets - 10 reps - Sidelying Shoulder External Rotation AROM  - 1 x daily - 7 x weekly - 1 sets - 10 reps - Seated Eccentric Abdominal Lean Back  - 1 x daily - 7 x weekly - 1 sets - 10 reps - Supine Shoulder Horizontal Abduction with Resistance  - 1 x daily - 7 x weekly - 1 sets - 5 reps - Supine Shoulder External Rotation with Resistance  - 1 x daily - 7 x weekly - 3 sets - 5 reps  ASSESSMENT:  CLINICAL IMPRESSION: Pt is wondering if she needs to modify how many days of the week she should exercise her shoulders since she wakes up every morning with pain. We discussed perhaps exercising every other day may be better and to have another discussion with Stacy on Wednesday about it.       OBJECTIVE  IMPAIRMENTS decreased activity tolerance, difficulty walking, decreased ROM, decreased strength, impaired perceived functional ability, impaired UE functional use, and pain.   ACTIVITY LIMITATIONS carrying, lifting, standing, bathing, dressing, reach over head, and locomotion level  PARTICIPATION LIMITATIONS: meal prep, cleaning, laundry, and community activity  PERSONAL FACTORS Age, Time since onset of injury/illness/exacerbation, and 1-2 comorbidities: multiple body regions affected, peripheral neuropathy, scoliosis, osteoporosis  are also affecting patient's functional outcome.   REHAB POTENTIAL: Good  CLINICAL DECISION MAKING: moderately complex EVALUATION COMPLEXITY: moderate   GOALS: Goals reviewed with patient? Yes  SHORT TERM GOALS: Target date: 12/11/2021  The patient will demonstrate knowledge of basic self care strategies and exercises to promote healing   Baseline: Goal status:Goal met 12/16/21  2.  The patient will report a 30% improvement in back and shoulder pain levels with functional activities including reaching overhead, walking, standing Baseline:  Goal status: goal met 11/1 3.  The patient will have improved trunk flexor and extensor muscle strength and hip strength to at least 4/5 needed for lifting medium weight objects such as grocery bags  Baseline:  Goal status: goal met 11/1 4.  The patient will have improved shoulder elevation ROM to at least 122 degrees needed for grooming/dressing purposes as well as reaching high shelves  Baseline:  Goal status: goal met 11/1   LONG TERM GOALS: Target date: 01/08/2022  The patient will be independent in a safe self progression of a home exercise program to promote further recovery of function   Baseline:  Goal status: ongoing  2.  The patient will report a 65% improvement in back and shoulder pain levels with functional activities which are currently difficult  including walking, standing, reaching  overhead Baseline:  Goal status: ongoing  3.  The patient will have improved shoulder elevation ROM to at least 140 degrees needed for grooming/dressing purposes as well as reaching high shelves  Baseline:  Goal status: ongoing  4.  The patient will have grossly 4/5 strength needed to lift and lower a 1-3# object from a high shelf  Baseline:  Goal status: ongoing  5.  The patient will have improved FOTO score to   63%    indicating improved function  with less pain  Baseline:  Goal status: ongoing  6.  5x sit to stand test improved to 15 sec Goal met 12  sec    PLAN: PT FREQUENCY: 2x/week  PT DURATION: 8 weeks  PLANNED INTERVENTIONS: Therapeutic exercises, Therapeutic activity, Neuromuscular re-education, Patient/Family education, Self Care, Joint mobilization, Aquatic Therapy, Dry Needling, Electrical stimulation, Spinal mobilization, Cryotherapy, Moist heat, Traction, Ultrasound, Manual therapy, and Re-evaluation.  PLAN FOR NEXT SESSION:  Reassessment next visit, see if resting Lt arm made pain lessen.   Myrene Galas, PTA 01/06/22 2:44 PM

## 2022-01-08 ENCOUNTER — Ambulatory Visit: Payer: Medicare Other | Admitting: Physical Therapy

## 2022-01-08 DIAGNOSIS — M5459 Other low back pain: Secondary | ICD-10-CM

## 2022-01-08 DIAGNOSIS — M25512 Pain in left shoulder: Secondary | ICD-10-CM

## 2022-01-08 DIAGNOSIS — R279 Unspecified lack of coordination: Secondary | ICD-10-CM | POA: Diagnosis not present

## 2022-01-08 DIAGNOSIS — M6281 Muscle weakness (generalized): Secondary | ICD-10-CM | POA: Diagnosis not present

## 2022-01-08 DIAGNOSIS — M25612 Stiffness of left shoulder, not elsewhere classified: Secondary | ICD-10-CM | POA: Diagnosis not present

## 2022-01-08 NOTE — Therapy (Signed)
OUTPATIENT PHYSICAL THERAPY THORACOLUMBAR/SHOULDER   Patient Name: Savannah Diaz MRN: [id number] DOB:04/20/1932, 88 y.o., female Today's Date: 01/06/2022        PT End of Session - 01/08/22 1400     Visit Number 15    Date for PT Re-Evaluation 03/05/22    Authorization Type Medicare    Progress Note Due on Visit 30    PT Start Time 1401    PT Stop Time 1444    PT Time Calculation (min) 43 min    Activity Tolerance Patient tolerated treatment well                      Past Medical History:  Diagnosis Date   Anxiety    Arthritis    "qwhere; but it doesn't hurt" (05/12/2012)   Aspiration pneumonia ([place]) 03/21/2016   Cataract    left   Compression fracture of first lumbar vertebra ([place]) 07/2016   superior endplate   Depression    Dysphagia    "have had it for 7 yr; got worse 4 days ago" (05/12/2012)   Dysphagia, pharyngoesophageal phase - suspected 05/10/2012   GERD (gastroesophageal reflux disease)    Hypertension    Insomnia    Lumbago with sciatica, right side    Migraines    "menopause cured them" (05/12/2012)   Osteoporosis    Stage 3 chronic kidney disease ([place])    Uterine prolapse    Past Surgical History:  Procedure Laterality Date   CATARACT EXTRACTION W/ INTRAOCULAR LENS IMPLANT Left ? 2010   ESOPHAGEAL MANOMETRY N/A 11/08/2018   Procedure: ESOPHAGEAL MANOMETRY (EM);  Surgeon: Gatha Mayer, MD;  Location: [facility];  Service: Endoscopy;  Laterality: N/A;   ESOPHAGOGASTRODUODENOSCOPY N/A 05/10/2012   Procedure: ESOPHAGOGASTRODUODENOSCOPY (EGD);  Surgeon: Jerene Bears, MD;  Location: [facility];  Service: Gastroenterology;  Laterality: N/A;   TONSILLECTOMY  1940's   Patient Active Problem List   Diagnosis Date Noted   High risk medication use 08/08/2016   Influenza A 03/22/2016   Fall 03/21/2016   Acute on chronic kidney failure II-III 03/21/2016   Transient alteration of awareness 05/10/2015   Hypokalemia 05/12/2012   Dysphagia  oropharyngeal and pharyngeal 05/10/2012   Hypertension 04/28/2011   Osteoarthritis 04/28/2011   Agoraphobia 04/28/2011    PCP: Veleta Miners MD  REFERRING PROVIDER: Veleta Miners MD  REFERRING DIAG: M54,50, G89.29 Chronic bil low back pain without sciatica; left shoulder pain  Rationale for Evaluation and Treatment Rehabilitation  THERAPY DIAG:  Back pain; weakness  ONSET DATE: 09/12/2021  SUBJECTIVE:  SUBJECTIVE STATEMENT: After Wednesday my shoulder felt terrible so Monday we decided to rest the rotator cuff.  Could you check my ex's and write down what muscles it works.  Been taking OTC meds.    PERTINENT HISTORY:  Peripheral neuropathy Incontinence bladder/bowel  Right hand dominant;  scoliosis; osteoporosis; 2018 compression fracture L1 when fell backwards  PAIN:  Are you having pain? Yes NPRS scale: 2/10 Pain location: left shoulder - lateral deltoid and upper trap; back hurts at different times  No back pain right now Aggravating factors: mornings; elevating left arm; reaching for a skillet in cabinet;  walking 2,000 steps on pedometer;  prolonged standing Relieving factors: sitting, lying OK  PRECAUTIONS: None  WEIGHT BEARING RESTRICTIONS No  FALLS:  Has patient fallen in last 6 months? No  LIVING ENVIRONMENT: Lives with: lives with their family and [facility] [place]   OCCUPATION: retired   PLOF: Independent with basic ADLs  PATIENT GOALS want to feel better.  Help shoulder pain; stand in the kitchen; not have to lie down after a walk   OBJECTIVE:   DIAGNOSTIC FINDINGS:  None recently  PATIENT SURVEYS:  FOTO shoulder 54%  12/25/21:  FOTO shoulder: 59% 11/15:  70%   COGNITION:  Overall cognitive status: Within functional limits for tasks assessed      MUSCLE  LENGTH: Hamstrings: Right 50 deg; Left 50 deg Thomas test: Right 0 deg; Left 0 deg  POSTURE: rounded shoulders and decreased lumbar lordosis; mild forward trunk   SHOULDER ROM:  RIGHT:  FLEXION:  140   ABDUCTION:   163  EXTERNAL ROTATION:   85  INTERNAL ROTATION: t8                                  LEFT:    FLEXION: 108     ABDUCTION:     65   EXTERNAL ROTATION:   63    INTERNAL ROTATION: L5  10/4:  115 with golf club; supine 112;  seated flexion 125 degrees:  abduction 93;  external rotation 62; internal rotation L1   12/16/21:RT Shoulder: Flexion 148, Abduction 165, Ext Rot 90, Int Rot T8: Left Shoulder: Flexion 108, Abduction 10 slightly out of plane, Ext rot 62, Internal rot L1  12/25/21:  RT Shoulder: Flexion  165, Abduction  165 , Ext Rot 90, Int Rot T8: Left Shoulder: Flexion 120, Abduction  110 slightly out of plane, Ext rot 76, Internal rotT10  11/15: RT Shoulder: Flexion  156, Abduction  172 , Ext Rot 90, Int Rot T8: Left Shoulder: Flexion 114, Abduction  108 slightly out of plane, Ext rot 62 , Internal rot T10  SHOULDER STRENGTH :  left:  Flexion:  4-/5;  ABDUCTION 2+/5, external and internal rotation 4/5            11/15:  left 4/5, abduction 3/5, internal and external rotation 4/5 LUMBAR ROM:   Active  A/PROM  eval 10/4 11/15  Flexion 50 50 Able to reach floor easily  Extension _0 Right lateral flexion 35 35 35  Left lateral flexion 35 35 35  Right rotation     Left rotation      (Blank rows = not tested)  LOWER EXTREMITY ROM:     TRUNK STRENGTH:  Decreased activation of transverse abdominus muscles; abdominals 4-/5; decreased activation of lumbar multifidi; trunk extensors 4-/5   LOWER EXTREMITY MMT:  Able to  kneel down on floor and get back up with UE assist on chair;  able to sit to stand without UE assist; hip abduction 4/5   FUNCTIONAL TESTS:  5 times sit to stand: 17 Timed up and go (TUG): 12  no UE use 12/16/21: 5x sit to stand 12 sec  11/15:  TUG  8.57 5x sit to stand 9.51  GAIT:  Comments: no assistive device;  WFLs    TODAY'S TREATMENT  01/08/22: Nu-step L5 x 4 min while discussing status FOTO TUG 5x sit to stand Shoulder ROM Lumbar ROM 1# and 2# weight reach to 1st and 2nd shelf Comprehensive review of HEP and discussion on restarting supine elevation and continuing core ex then restarting "medium" shoulder ex's in 1-2 weeks; she is somewhat fearful of theraband from a past experience so we discussed eliminating these from the program     01/06/22: Nu-step L1 x 10 min nold model while discussing status and pt's concern as to why her shoulder have hurt more this past wee and an overall confusion on what she should or should not do when she hurts. We reduced resistance secondary to RT knee pain complaint.  Supine stick overhead 10x AAROM Supine green loop isometric in nature press into Ext rot 2 sec hold 5x Supine pushups with stick 10x Single arm slides up wall 10x Rt, Lt 5x with arm pain. Stopped exercise.PTA suggested no LTUE exercise until Wednesday to give it a rest until re-assesment. Pt agreed and we completed todays sessison.      01/01/22: Nu-step L 4 x 6 min new model while discussing status Supine yellow band slight tension while elevating 5x Supine yellow band horizontal abduction 5x  Supine yellow band external rotation isometric 5 sec hold 10x Supine yellow band diagonals 5x each way Sidelying external rotation  60% ROM 1# 10x right/left  Towel slides on wall 5th fingers against wall elevation 5x; Wall push ups plus 10x Arm bike: L1.5 2x2 PT present to montior  PATIENT EDUCATION:  Education details: Access Code: [email or web address] Person educated: Patient Education method: Explanation, Demonstration, Tactile cues, Verbal cues, and Handouts Education comprehension: verbalized understanding and returned demonstration   HOME EXERCISE PROGRAM: Access Code: [email or web address] URL:  [email or web address] Date: 01/01/2022 Prepared by: Ruben Im  Exercises - Supine Shoulder Flexion Extension AAROM with Dowel  - 1 x daily - 7 x weekly - 3 sets - 10 reps - Seated Figure 4 Piriformis Stretch  - 1 x daily - 7 x weekly - 1 sets - 3 reps - 30 hold - Hooklying Isometric Hip Flexion  - 1 x daily - 7 x weekly - 2 sets - 5 reps - 5 hold - Seated Abdominal Set  - 1 x daily - 7 x weekly - 1 sets - 10 reps - Seated Thoracic Lumbar Extension  - 1 x daily - 7 x weekly - 1 sets - 10 reps - Hooklying Bent Leg Lifts  - 1 x daily - 7 x weekly - 1 sets - 10 reps - Clamshell  - 1 x daily - 7 x weekly - 1 sets - 10 reps - Sidelying Shoulder Abduction Palm Forward (Mirrored)  - 1 x daily - 7 x weekly - 3 sets - 5 reps - Supine Transversus Abdominis Bracing - Hands on Thighs  - 1 x daily - 7 x weekly - 1 sets - 10 reps - serratus punch  - 1 x daily - 7 x weekly - 1 sets - 10 reps -  Sidelying Shoulder External Rotation  - 1 x daily - 7 x weekly - 1 sets - 10 reps - Standing Single Arm Row with Resistance Thumb Up (Mirrored)  - 1 x daily - 7 x weekly - 1 sets - 10 reps - Single Arm Shoulder Extension with Anchored Resistance (Mirrored)  - 1 x daily - 7 x weekly - 1 sets - 10 reps - Seated Upper Trapezius Stretch  - 1 x daily - 7 x weekly - 1 sets - 2-3 reps - 30 hold - Shoulder Internal Rotation with Resistance  - 1 x daily - 7 x weekly - 1 sets - 10 reps - Standing with Back Flat Against Wall  - 1 x daily - 7 x weekly - 1 sets - 10 reps - Sidelying Shoulder External Rotation AROM  - 1 x daily - 7 x weekly - 1 sets - 10 reps - Seated Eccentric Abdominal Lean Back  - 1 x daily - 7 x weekly - 1 sets - 10 reps - Supine Shoulder Horizontal Abduction with Resistance  - 1 x daily - 7 x weekly - 1 sets - 5 reps - Supine Shoulder External Rotation with Resistance  - 1 x daily - 7 x weekly - 3 sets - 5 reps  ASSESSMENT:  CLINICAL IMPRESSION: The patient was progressing well with her  shoulder until an exacerbation 1 week ago and she has been resting it since then.  We spent a good length of time discussing which ex's could be restarted and modifying number of repetitions.  Her shoulder FOTO outcome score did improve significantly since start of care.  Her lumbar ROM is very good and she is able to perform lumbo/pelvic/hip core strengthening ex's without cues.  Her TUG and 5x sit to stand times have dramatically improved and are above average for her age.  Given her recent shoulder pain exacerbation, recommend follow up in 2-3 weeks to ensure a successful return to her HEP and low pain levels rather than discharge.     OBJECTIVE IMPAIRMENTS decreased activity tolerance, difficulty walking, decreased ROM, decreased strength, impaired perceived functional ability, impaired UE functional use, and pain.   ACTIVITY LIMITATIONS carrying, lifting, standing, bathing, dressing, reach over head, and locomotion level  PARTICIPATION LIMITATIONS: meal prep, cleaning, laundry, and community activity  PERSONAL FACTORS Age, Time since onset of injury/illness/exacerbation, and 1-2 comorbidities: multiple body regions affected, peripheral neuropathy, scoliosis, osteoporosis  are also affecting patient's functional outcome.   REHAB POTENTIAL: Good  CLINICAL DECISION MAKING: moderately complex EVALUATION COMPLEXITY: moderate   GOALS: Goals reviewed with patient? Yes  SHORT TERM GOALS: Target date: 12/11/2021  The patient will demonstrate knowledge of basic self care strategies and exercises to promote healing   Baseline: Goal status:Goal met 12/16/21  2.  The patient will report a 30% improvement in back and shoulder pain levels with functional activities including reaching overhead, walking, standing Baseline:  Goal status: goal met 11/1 3.  The patient will have improved trunk flexor and extensor muscle strength and hip strength to at least 4/5 needed for lifting medium weight objects  such as grocery bags  Baseline:  Goal status: goal met 11/1 4.  The patient will have improved shoulder elevation ROM to at least 122 degrees needed for grooming/dressing purposes as well as reaching high shelves  Baseline:  Goal status: goal met 11/1   LONG TERM GOALS: Target date: 03/05/22  The patient will be independent in a safe self progression of a home  exercise program to promote further recovery of function   Baseline:  Goal status: ongoing  2.  The patient will report a 65% improvement in back and shoulder pain levels with functional activities which are currently difficult  including walking, standing, reaching overhead Baseline:  Goal status: ongoing  3.  The patient will have improved shoulder elevation ROM to at least 140 degrees needed for grooming/dressing purposes as well as reaching high shelves  Baseline:  Goal status: ongoing  4.  The patient will have grossly 4/5 strength needed to lift and lower a 1-3# object from a high shelf  Baseline:  Goal status: ongoing  5.  The patient will have improved FOTO score to   63%    indicating improved function with less pain  Baseline:  Goal status: goal met 11/15  6.  5x sit to stand test improved to 15 sec Goal met 12  sec    PLAN: PT FREQUENCY: 2x/week  PT DURATION: 8 weeks  PLANNED INTERVENTIONS: Therapeutic exercises, Therapeutic activity, Neuromuscular re-education, Patient/Family education, Self Care, Joint mobilization, Aquatic Therapy, Dry Needling, Electrical stimulation, Spinal mobilization, Cryotherapy, Moist heat, Traction, Ultrasound, Manual therapy, and Re-evaluation.  PLAN FOR NEXT SESSION:  Will follow up in  2-3 weeks   Ruben Im, PT 01/08/22 4:29 PM Phone: [phone] Fax: [phone]

## 2022-01-09 ENCOUNTER — Ambulatory Visit (INDEPENDENT_AMBULATORY_CARE_PROVIDER_SITE_OTHER): Payer: Medicare Other | Admitting: Orthopedic Surgery

## 2022-01-09 ENCOUNTER — Encounter: Payer: Self-pay | Admitting: Orthopedic Surgery

## 2022-01-09 DIAGNOSIS — Z0000 Encounter for general adult medical examination without abnormal findings: Secondary | ICD-10-CM | POA: Diagnosis not present

## 2022-01-09 NOTE — Patient Instructions (Signed)
  Savannah Diaz , Thank you for taking time to come for your Medicare Wellness Visit. I appreciate your ongoing commitment to your health goals. Please review the following plan we discussed and let me know if I can assist you in the future.   These are the goals we discussed:  Goals      Maintain Mobility and Function     Evidence-based guidance:  Emphasize the importance of physical activity and aerobic exercise as included in treatment plan; assess barriers to adherence; consider patient's abilities and preferences.  Encourage gradual increase in activity or exercise instead of stopping if pain occurs.  Reinforce individual therapy exercise prescription, such as strengthening, stabilization and stretching programs.  Promote optimal body mechanics to stabilize the spine with lifting and functional activity.  Encourage activity and mobility modifications to facilitate optimal function, such as using a log roll for bed mobility or dressing from a seated position.  Reinforce individual adaptive equipment recommendations to limit excessive spinal movements, such as a Systems analyst.  Assess adequacy of sleep; encourage use of sleep hygiene techniques, such as bedtime routine; use of white noise; dark, cool bedroom; avoiding daytime naps, heavy meals or exercise before bedtime.  Promote positions and modification to optimize sleep and sexual activity; consider pillows or positioning devices to assist in maintaining neutral spine.  Explore options for applying ergonomic principles at work and home, such as frequent position changes, using ergonomically designed equipment and working at optimal height.  Promote modifications to increase comfort with driving such as lumbar support, optimizing seat and steering wheel position, using cruise control and taking frequent rest stops to stretch and walk.   Notes:         This is a list of the screening recommended for you and due dates:  Health  Maintenance  Topic Date Due   Zoster (Shingles) Vaccine (1 of 2) Never done   DEXA scan (bone density measurement)  Never done   COVID-19 Vaccine (7 - Moderna risk series) 10/02/2021   Medicare Annual Wellness Visit  01/10/2023   Tetanus Vaccine  12/19/2027   Pneumonia Vaccine  Completed   Flu Shot  Completed   HPV Vaccine  Aged Out

## 2022-01-09 NOTE — Progress Notes (Signed)
Subjective:   Savannah Diaz is a 88 y.o. female who presents for Medicare Annual (Subsequent) preventive examination.  Place of Service: [place] Provider: Windell Moulding, AGNP-C   Review of Systems     Cardiac Risk Factors include: advanced age (>21mn, >>39women)     Objective:    Today's Vitals   01/09/22 1323 01/09/22 1355  PainSc: 2  2    There is no height or weight on file to calculate BMI.     01/09/2022    1:31 PM 11/13/2021    1:56 PM 09/11/2021    3:47 PM 07/24/2021    2:54 PM 06/26/2021    2:16 PM 06/24/2021    5:34 PM 05/24/2021   12:51 PM  Advanced Directives  Does Patient Have a Medical Advance Directive? Yes Yes No No No No Yes  Type of [place];Healthcare Power of Attorney       Does patient want to make changes to medical advance directive? No - Patient declined No - Patient declined       Copy of HSoldierin Chart?  No - copy requested       Would patient like information on creating a medical advance directive?   No - Patient declined No - Patient declined No - Patient declined      Current Medications (verified) Outpatient Encounter Medications as of 01/09/2022  Medication Sig   amLODipine (NORVASC) 10 MG tablet Take 1 tablet (10 mg total) by mouth daily.   atenolol (TENORMIN) 50 MG tablet Take 1 tablet (50 mg total) by mouth daily.   Cholecalciferol (VITAMIN D3) 25 MCG (1000 UT) CAPS Take by mouth daily.   conjugated estrogens (PREMARIN) vaginal cream Place 1 Applicatorful vaginally daily.   famotidine (PEPCID) 40 MG tablet Take 1 tablet (40 mg total) by mouth 2 (two) times daily.   LORazepam (ATIVAN) 0.5 MG tablet TAKE ONE TABLET BY MOUTH TWICE DAILY AS NEEDED FOR ANXIETY   mirtazapine (REMERON) 15 MG tablet TAKE ONE TABLET BY MOUTH AT BEDTIME   sertraline (ZOLOFT) 20 MG/ML concentrated solution TAKE ONE TEASPOONFUL (5ML) BY MOUTH ONCE DAILY   traZODone (DESYREL)  50 MG tablet Take 1 tablet (50 mg total) by mouth at bedtime.   vitamin B-12 (CYANOCOBALAMIN) 1000 MCG tablet Take 1 tablet (1,000 mcg total) by mouth daily.   mirtazapine (REMERON) 30 MG tablet Take 30 mg by mouth at bedtime. Patient states taking 15 mg (Patient not taking: Reported on 01/09/2022)   No facility-administered encounter medications on file as of 01/09/2022.    Allergies (verified) Lactose intolerance (gi), Lactulose, and Sulfa antibiotics   History: Past Medical History:  Diagnosis Date   Anxiety    Arthritis    "qwhere; but it doesn't hurt" (05/12/2012)   Aspiration pneumonia ([facility] 03/21/2016   Cataract    left   Compression fracture of first lumbar vertebra ([facility] 07/2016   superior endplate   Depression    Dysphagia    "have had it for 7 yr; got worse 4 days ago" (05/12/2012)   Dysphagia, pharyngoesophageal phase - suspected 05/10/2012   GERD (gastroesophageal reflux disease)    Hypertension    Insomnia    Lumbago with sciatica, right side    Migraines    "menopause cured them" (05/12/2012)   Osteoporosis    Stage 3 chronic kidney disease [facility]    Uterine prolapse    Past Surgical History:  Procedure  Laterality Date   CATARACT EXTRACTION W/ INTRAOCULAR LENS IMPLANT Left ? 2010   ESOPHAGEAL MANOMETRY N/A 11/08/2018   Procedure: ESOPHAGEAL MANOMETRY (EM);  Surgeon: Gatha Mayer, MD;  Location: [facility];  Service: Endoscopy;  Laterality: N/A;   ESOPHAGOGASTRODUODENOSCOPY N/A 05/10/2012   Procedure: ESOPHAGOGASTRODUODENOSCOPY (EGD);  Surgeon: Jerene Bears, MD;  Location: [facility];  Service: Gastroenterology;  Laterality: N/A;   TONSILLECTOMY  71's   Family History  Problem Relation Age of Onset   Arthritis Mother    Hypertension Mother    Dementia Mother    Colon cancer Father    Stomach cancer Daughter    Esophageal cancer Neg Hx    Pancreatic cancer Neg Hx    Social History   Socioeconomic History   Marital status: Widowed    Spouse  name: Not on file   Number of children: 2   Years of education: Not on file   Highest education level: Not on file  Occupational History   Occupation: Retired  Tobacco Use   Smoking status: Never   Smokeless tobacco: Never  Vaping Use   Vaping Use: Never used  Substance and Sexual Activity   Alcohol use: Not Currently    Comment: 05/12/2012 "have a drink hardly ever anymore"   Drug use: No   Sexual activity: Not Currently  Other Topics Concern   Not on file  Social History Narrative   Widowed, retired, 2 daughters husband was a Chief Executive Officer professor at [employer]   Her daughters and son-in-law's are all philosophy professors to it [place] and [place], daughter here at [employer] son-in-law at [place] at [facility]   No Etoh/tobacco   Back exercises and walking are patient forms of exercising.     Social Determinants of Health   Financial Resource Strain: Low Risk  (01/09/2022)   Overall Financial Resource Strain (CARDIA)    Difficulty of Paying Living Expenses: Not hard at all  Food Insecurity: No Food Insecurity (01/09/2022)   Hunger Vital Sign    Worried About Running Out of Food in the Last Year: Never true    Ran Out of Food in the Last Year: Never true  Transportation Needs: No Transportation Needs (01/09/2022)   PRAPARE - Hydrologist (Medical): No    Lack of Transportation (Non-Medical): No  Physical Activity: Insufficiently Active (01/09/2022)   Exercise Vital Sign    Days of Exercise per Week: 2 days    Minutes of Exercise per Session: 40 min  Stress: No Stress Concern Present (01/09/2022)   [place]    Feeling of Stress : Only a little  Social Connections: Socially Isolated (01/09/2022)   Social Connection and Isolation Panel [NHANES]    Frequency of Communication with Friends and Family: More than three times a week    Frequency of Social Gatherings  with Friends and Family: Twice a week    Attends Religious Services: Never    Marine scientist or Organizations: No    Attends Archivist Meetings: Never    Marital Status: Widowed    Tobacco Counseling Counseling given: Not Answered   Clinical Intake:  Pre-visit preparation completed: Yes  Pain : No/denies pain Pain Score: 2  Pain Type: Acute pain Pain Location: Shoulder Pain Orientation: Right Pain Descriptors / Indicators: Aching Pain Onset: More than a month ago Pain Frequency: Occasional     BMI - recorded:  22.62 Nutritional Status: BMI of 19-24  Normal Nutritional Risks: None Diabetes: No  How often do you need to have someone help you when you read instructions, pamphlets, or other written materials from your doctor or pharmacy?: 1 - Never What is the last grade level you completed in school?: college  Diabetic?No  Interpreter Needed?: No  Information entered by :: [place] [facility] Daily Living    01/09/2022    2:06 PM 01/09/2022    1:34 PM  In your present state of health, do you have any difficulty performing the following activities:  Hearing? 0 0  Vision?  0  Difficulty concentrating or making decisions? 0 0  Walking or climbing stairs? 0 0  Dressing or bathing? 0 0  Doing errands, shopping? 0 0  Preparing Food and eating ? N N  Using the Toilet? N N  In the past six months, have you accidently leaked urine? Y Y  Do you have problems with loss of bowel control? Y Y  Managing your Medications? N N  Managing your Finances? N N  Housekeeping or managing your Housekeeping? N N    Patient Care Team: Virgie Dad, MD as PCP - General (Internal Medicine) Alda Berthold, DO as Consulting Physician (Neurology)  Indicate any recent Medical Services you may have received from other than [facility] providers in the past year (date may be approximate).     Assessment:   This is a routine wellness examination for  Savannah Diaz.  Hearing/Vision screen Hearing Screening - Comments:: No hearing concern Vision Screening - Comments:: Wear glasses  Dietary issues and exercise activities discussed: Current Exercise Habits: The patient does not participate in regular exercise at present, Type of exercise: walking;stretching, Time (Minutes): 40, Frequency (Times/Week): 2, Weekly Exercise (Minutes/Week): 80, Intensity: Moderate, Exercise limited by: None identified   Goals Addressed             This Visit's Progress    Maintain Mobility and Function   On track    Evidence-based guidance:  Emphasize the importance of physical activity and aerobic exercise as included in treatment plan; assess barriers to adherence; consider patient's abilities and preferences.  Encourage gradual increase in activity or exercise instead of stopping if pain occurs.  Reinforce individual therapy exercise prescription, such as strengthening, stabilization and stretching programs.  Promote optimal body mechanics to stabilize the spine with lifting and functional activity.  Encourage activity and mobility modifications to facilitate optimal function, such as using a log roll for bed mobility or dressing from a seated position.  Reinforce individual adaptive equipment recommendations to limit excessive spinal movements, such as a Systems analyst.  Assess adequacy of sleep; encourage use of sleep hygiene techniques, such as bedtime routine; use of white noise; dark, cool bedroom; avoiding daytime naps, heavy meals or exercise before bedtime.  Promote positions and modification to optimize sleep and sexual activity; consider pillows or positioning devices to assist in maintaining neutral spine.  Explore options for applying ergonomic principles at work and home, such as frequent position changes, using ergonomically designed equipment and working at optimal height.  Promote modifications to increase comfort with driving such as lumbar  support, optimizing seat and steering wheel position, using cruise control and taking frequent rest stops to stretch and walk.   Notes:        Depression Screen    01/09/2022    1:30 PM 07/03/2021    3:36 PM 08/09/2020    1:24 PM 04/27/2015  10:35 AM 04/19/2014    9:14 AM  PHQ 2/9 Scores  PHQ - 2 Score 0 0 1 0 1    Fall Risk    01/09/2022    2:06 PM 01/09/2022    1:30 PM 09/11/2021    3:47 PM 07/24/2021    2:54 PM 06/26/2021    2:16 PM  Fall Risk   Falls in the past year? 0 0 0 0 0  Number falls in past yr: 0 0 0 0 0  Injury with Fall? 0 0 0 0 0  Risk for fall due to : History of fall(s);Impaired balance/gait No Fall Risks No Fall Risks No Fall Risks No Fall Risks  Follow up Falls evaluation completed;Education provided;Falls prevention discussed Falls evaluation completed Falls evaluation completed Falls evaluation completed Falls evaluation completed    FALL RISK PREVENTION PERTAINING TO THE HOME:  Any stairs in or around the home? Yes  If so, are there any without handrails? Yes  Home free of loose throw rugs in walkways, pet beds, electrical cords, etc? Yes  Adequate lighting in your home to reduce risk of falls? Yes   ASSISTIVE DEVICES UTILIZED TO PREVENT FALLS:  Life alert? Yes  Use of a cane, walker or w/c? No  Grab bars in the bathroom? Yes  Shower chair or bench in shower? Yes  Elevated toilet seat or a handicapped toilet? Yes   TIMED UP AND GO:  Was the test performed? .  Length of time to ambulate 10 feet: N/A sec.   Gait slow and steady without use of assistive device  Cognitive Function:        01/09/2022    1:37 PM  6CIT Screen  What Year? 0 points  What month? 0 points  What time? 0 points  Count back from 20 0 points  Months in reverse 0 points  Repeat phrase 0 points  Total Score 0 points    Immunizations Immunization History  Administered Date(s) Administered   Fluad Quad(high Dose 65+) 12/11/2021   Influenza Split 10/16/2017,  10/28/2019, 11/29/2020   Influenza Whole 12/31/2003, 12/29/2004, 12/27/2006, 12/12/2007, 12/03/2008   Influenza, High Dose Seasonal PF 11/12/2012, 11/06/2015, 12/12/2015, 11/14/2016, 12/10/2018   Influenza-Unspecified 10/26/2011, 10/25/2013, 11/06/2015   Moderna Covid-19 Vaccine Bivalent Booster 22yr & up 11/14/2020, 08/07/2021   Moderna Sars-Covid-2 Vaccination 02/28/2019, 03/28/2019, 01/09/2020, 06/25/2020   Pneumococcal Conjugate-13 08/08/2013   Pneumococcal Polysaccharide-23 11/13/2006, 11/14/2016   Td (Adult) 11/13/2006   Tdap 12/18/2017   Zoster, Live 08/27/2010, 12/27/2018, 04/27/2019    TDAP status: Up to date  Flu Vaccine status: Up to date  Pneumococcal vaccine status: Up to date  Covid-19 vaccine status: Completed vaccines  Qualifies for Shingles Vaccine? Yes   Zostavax completed Yes   Shingrix Completed?: No.    Education has been provided regarding the importance of this vaccine. Patient has been advised to call insurance company to determine out of pocket expense if they have not yet received this vaccine. Advised may also receive vaccine at local pharmacy or Health Dept. Verbalized acceptance and understanding.  Screening Tests Health Maintenance  Topic Date Due   Zoster Vaccines- Shingrix (1 of 2) Never done   DEXA SCAN  Never done   COVID-19 Vaccine (7 - Moderna risk series) 10/02/2021   Medicare Annual Wellness (AWV)  01/10/2023   TETANUS/TDAP  12/19/2027   Pneumonia Vaccine 88 Years old  Completed   INFLUENZA VACCINE  Completed   HPV VACCINES  Aged Out    Health Maintenance  Health Maintenance Due  Topic Date Due   Zoster Vaccines- Shingrix (1 of 2) Never done   DEXA SCAN  Never done   COVID-19 Vaccine (7 - Moderna risk series) 10/02/2021    Colorectal cancer screening: No longer required.   Mammogram status: No longer required due to advanced age.  Bone Density status: Ordered 03/04/2022. Pt provided with contact info and advised to call to  schedule appt.  Lung Cancer Screening: (Low Dose CT Chest recommended if Age 71-80 years, 30 pack-year currently smoking OR have quit w/in 15years.) does not qualify.   Lung Cancer Screening Referral: No  Additional Screening:  Hepatitis C Screening: does not qualify; Completed   Vision Screening: Recommended annual ophthalmology exams for early detection of glaucoma and other disorders of the eye. Is the patient up to date with their annual eye exam?  Yes  Who is the provider or what is the name of the office in which the patient attends annual eye exams? Dr. Katy Fitch If pt is not established with a provider, would they like to be referred to a provider to establish care? No .   Dental Screening: Recommended annual dental exams for proper oral hygiene  Community Resource Referral / Chronic Care Management: CRR required this visit?  No   CCM required this visit?  No      Plan:     I have personally reviewed and noted the following in the patient's chart:   Medical and social history Use of alcohol, tobacco or illicit drugs  Current medications and supplements including opioid prescriptions. Patient is not currently taking opioid prescriptions. Functional ability and status Nutritional status Physical activity Advanced directives List of other physicians Hospitalizations, surgeries, and ER visits in previous 12 months Vitals Screenings to include cognitive, depression, and falls Referrals and appointments  In addition, I have reviewed and discussed with patient certain preventive protocols, quality metrics, and best practice recommendations. A written personalized care plan for preventive services as well as general preventive health recommendations were provided to patient.     Yvonna Alanis, NP   01/09/2022   Nurse Notes: She plans to get Shingrix vaccine in a few weeks. She reports having covid vaccine recently but cannot find card with date.   Telephone Note  I  connected with Savannah Diaz by telephone and verified that I am speaking with the correct person using two identifiers.  Location: [place] Patient: Savannah Diaz Provider: Yvonna Alanis, NP    I discussed the limitations, risks, security and privacy concerns of performing an evaluation and management service by telephone and the availability of in person appointments. I also discussed with the patient that there may be a patient responsible charge related to this service. The patient expressed understanding and agreed to proceed.   I discussed the assessment and treatment plan with the patient. The patient was provided an opportunity to ask questions and all were answered. The patient agreed with the plan and demonstrated an understanding of the instructions.   The patient was advised to call back or seek an in-person evaluation if the symptoms worsen or if the condition fails to improve as anticipated.  I provided 15 minutes of non-face-to-face time during this encounter.  Yvonna Alanis, NP  Avs printed and mailed

## 2022-01-28 ENCOUNTER — Ambulatory Visit: Payer: Medicare Other | Attending: Internal Medicine | Admitting: Physical Therapy

## 2022-01-28 DIAGNOSIS — M25512 Pain in left shoulder: Secondary | ICD-10-CM | POA: Insufficient documentation

## 2022-01-28 DIAGNOSIS — M5459 Other low back pain: Secondary | ICD-10-CM | POA: Insufficient documentation

## 2022-01-28 DIAGNOSIS — M6281 Muscle weakness (generalized): Secondary | ICD-10-CM | POA: Diagnosis not present

## 2022-01-28 NOTE — Therapy (Signed)
OUTPATIENT PHYSICAL THERAPY THORACOLUMBAR/SHOULDER   Patient Name: Savannah Diaz MRN: [id number] DOB:07-28-1932, 88 y.o., female Today's Date: 01/28/2022        PT End of Session - 01/28/22 1351     Visit Number 16    Date for PT Re-Evaluation 03/05/22    Authorization Type Medicare    Progress Note Due on Visit 58    PT Start Time 1400    PT Stop Time 1440    PT Time Calculation (min) 40 min    Activity Tolerance Patient tolerated treatment well                      Past Medical History:  Diagnosis Date   Anxiety    Arthritis    "qwhere; but it doesn't hurt" (05/12/2012)   Aspiration pneumonia ([place]) 03/21/2016   Cataract    left   Compression fracture of first lumbar vertebra ([place]) 07/2016   superior endplate   Depression    Dysphagia    "have had it for 7 yr; got worse 4 days ago" (05/12/2012)   Dysphagia, pharyngoesophageal phase - suspected 05/10/2012   GERD (gastroesophageal reflux disease)    Hypertension    Insomnia    Lumbago with sciatica, right side    Migraines    "menopause cured them" (05/12/2012)   Osteoporosis    Stage 3 chronic kidney disease ([place])    Uterine prolapse    Past Surgical History:  Procedure Laterality Date   CATARACT EXTRACTION W/ INTRAOCULAR LENS IMPLANT Left ? 2010   ESOPHAGEAL MANOMETRY N/A 11/08/2018   Procedure: ESOPHAGEAL MANOMETRY (EM);  Surgeon: Gatha Mayer, MD;  Location: [facility];  Service: Endoscopy;  Laterality: N/A;   ESOPHAGOGASTRODUODENOSCOPY N/A 05/10/2012   Procedure: ESOPHAGOGASTRODUODENOSCOPY (EGD);  Surgeon: Jerene Bears, MD;  Location: [facility];  Service: Gastroenterology;  Laterality: N/A;   TONSILLECTOMY  1940's   Patient Active Problem List   Diagnosis Date Noted   High risk medication use 08/08/2016   Influenza A 03/22/2016   Fall 03/21/2016   Acute on chronic kidney failure II-III 03/21/2016   Transient alteration of awareness 05/10/2015   Hypokalemia 05/12/2012   Dysphagia  oropharyngeal and pharyngeal 05/10/2012   Hypertension 04/28/2011   Osteoarthritis 04/28/2011   Agoraphobia 04/28/2011    PCP: Veleta Miners MD  REFERRING PROVIDER: Veleta Miners MD  REFERRING DIAG: M54,50, G89.29 Chronic bil low back pain without sciatica; left shoulder pain  Rationale for Evaluation and Treatment Rehabilitation  THERAPY DIAG:  Back pain; weakness  ONSET DATE: 09/12/2021  SUBJECTIVE:  SUBJECTIVE STATEMENT: I've had a lot of aches and pains.  Up and down.  It's a little better today.  One day it hurt to turn over in bed.  I'm tired.  Every day is a new day.  I try to do my ex's in the morning and I walk.    PERTINENT HISTORY:  Peripheral neuropathy Incontinence bladder/bowel  Right hand dominant;  scoliosis; osteoporosis; 2018 compression fracture L1 when fell backwards  PAIN:  Are you having pain? Yes NPRS scale: 2/10 Pain location: left shoulder - lateral deltoid and upper trap; back hurts at different times  No back pain right now Aggravating factors: mornings; elevating left arm; reaching for a skillet in cabinet;  walking 2,000 steps on pedometer;  prolonged standing Relieving factors: sitting, lying OK  PRECAUTIONS: None  WEIGHT BEARING RESTRICTIONS No  FALLS:  Has patient fallen in last 6 months? No  LIVING ENVIRONMENT: Lives with: lives with their family and [facility] [place]   OCCUPATION: retired   PLOF: Independent with basic ADLs  PATIENT GOALS want to feel better.  Help shoulder pain; stand in the kitchen; not have to lie down after a walk   OBJECTIVE:   DIAGNOSTIC FINDINGS:  None recently  PATIENT SURVEYS:  FOTO shoulder 54%  12/25/21:  FOTO shoulder: 59% 11/15:  70%   COGNITION:  Overall cognitive status: Within functional limits for tasks  assessed      MUSCLE LENGTH: Hamstrings: Right 50 deg; Left 50 deg Thomas test: Right 0 deg; Left 0 deg  POSTURE: rounded shoulders and decreased lumbar lordosis; mild forward trunk   SHOULDER ROM:  RIGHT:  FLEXION:  140   ABDUCTION:   163  EXTERNAL ROTATION:   85  INTERNAL ROTATION: t8                                  LEFT:    FLEXION: 108     ABDUCTION:     65   EXTERNAL ROTATION:   63    INTERNAL ROTATION: L5  10/4:  115 with golf club; supine 112;  seated flexion 125 degrees:  abduction 93;  external rotation 62; internal rotation L1   12/16/21:RT Shoulder: Flexion 148, Abduction 165, Ext Rot 90, Int Rot T8: Left Shoulder: Flexion 108, Abduction 10 slightly out of plane, Ext rot 62, Internal rot L1  12/25/21:  RT Shoulder: Flexion  165, Abduction  165 , Ext Rot 90, Int Rot T8: Left Shoulder: Flexion 120, Abduction  110 slightly out of plane, Ext rot 76, Internal rotT10  11/15: RT Shoulder: Flexion  156, Abduction  172 , Ext Rot 90, Int Rot T8: Left Shoulder: Flexion 114, Abduction  108 slightly out of plane, Ext rot 62 , Internal rot T10  12/5:  RT Shoulder: Flexion  150, Abduction  174 , Ext Rot 78, Int Rot T6: Left Shoulder: Flexion 120,pain Abduction  123  slightly out of plane, Ext rot 65 , Internal rot T9  SHOULDER STRENGTH :  left:  Flexion:  4-/5;  ABDUCTION 2+/5, external and internal rotation 4/5            11/15:  left 4/5, abduction 3/5, internal and external rotation 4/5 12/5:  right grossly 4/5 except abduction 4-/5;  left grossly 4/5 abduction 3/5 LUMBAR ROM:   Active  A/PROM  eval 10/4 11/15 12/5  Flexion 50 50 Able to reach floor easily   Extension  _0 Right lateral flexion 35 35 35   Left lateral flexion 35 35 35   Right rotation      Left rotation       (Blank rows = not tested)  LOWER EXTREMITY ROM:     TRUNK STRENGTH:  Decreased activation of transverse abdominus muscles; abdominals 4-/5; decreased activation of lumbar multifidi; trunk extensors  4-/5   LOWER EXTREMITY MMT:  Able to kneel down on floor and get back up with UE assist on chair;  able to sit to stand without UE assist; hip abduction 4/5   FUNCTIONAL TESTS:  5 times sit to stand: 17 Timed up and go (TUG): 12  no UE use 12/16/21: 5x sit to stand 12 sec  11/15:  TUG 8.57 5x sit to stand 9.51  12/5: 5x sit to stand 9.7 sec TUG  6.82 sec sec  GAIT:  Comments: no assistive device;  WFLs    TODAY'S TREATMENT  12/5: Nu-step L3 x 4 min while discussing status Supine cane flexion 5x Supine cane press 5x Supine cane horizontal abduction/adduction 5x Supine external/internal rotation wipers 5x Shoulder ROM measurements Lumbar ROM MMT shoulder TUG 5x sit to stand Discussed results Discussion of World Health organization physical activity requirements 150-300 minutes/week Discussion of using 1# weights 10 reps then after 2-3 weeks increase to 2 sets of 10 Nutritional influences on pain (decrease sugar, processed foods and increase fruits and vegetables)       01/08/22: Nu-step L5 x 4 min while discussing status FOTO TUG 5x sit to stand Shoulder ROM Lumbar ROM 1# and 2# weight reach to 1st and 2nd shelf Comprehensive review of HEP and discussion on restarting supine elevation and continuing core ex then restarting "medium" shoulder ex's in 1-2 weeks; she is somewhat fearful of theraband from a past experience so we discussed eliminating these from the program     01/06/22: Nu-step L1 x 10 min nold model while discussing status and pt's concern as to why her shoulder have hurt more this past wee and an overall confusion on what she should or should not do when she hurts. We reduced resistance secondary to RT knee pain complaint.  Supine stick overhead 10x AAROM Supine green loop isometric in nature press into Ext rot 2 sec hold 5x Supine pushups with stick 10x Single arm slides up wall 10x Rt, Lt 5x with arm pain. Stopped exercise.PTA suggested no  LTUE exercise until Wednesday to give it a rest until re-assesment. Pt agreed and we completed todays sessison.     PATIENT EDUCATION:  Education details: Access Code: [email or web address] Person educated: Patient Education method: Explanation, Demonstration, Tactile cues, Verbal cues, and Handouts Education comprehension: verbalized understanding and returned demonstration   HOME EXERCISE PROGRAM: Access Code: [email or web address] URL: [email or web address] Date: 01/01/2022 Prepared by: Ruben Im  Exercises - Supine Shoulder Flexion Extension AAROM with [place]  - 1 x daily - 7 x weekly - 3 sets - 10 reps - Seated Figure 4 Piriformis Stretch  - 1 x daily - 7 x weekly - 1 sets - 3 reps - 30 hold - Hooklying Isometric Hip Flexion  - 1 x daily - 7 x weekly - 2 sets - 5 reps - 5 hold - Seated Abdominal Set  - 1 x daily - 7 x weekly - 1 sets - 10 reps - Seated Thoracic Lumbar Extension  - 1 x daily - 7 x weekly - 1 sets - 10 reps - Hooklying Bent Leg  Lifts  - 1 x daily - 7 x weekly - 1 sets - 10 reps - Clamshell  - 1 x daily - 7 x weekly - 1 sets - 10 reps - Sidelying Shoulder Abduction Palm Forward (Mirrored)  - 1 x daily - 7 x weekly - 3 sets - 5 reps - Supine Transversus Abdominis Bracing - Hands on Thighs  - 1 x daily - 7 x weekly - 1 sets - 10 reps - serratus punch  - 1 x daily - 7 x weekly - 1 sets - 10 reps - Sidelying Shoulder External Rotation  - 1 x daily - 7 x weekly - 1 sets - 10 reps - Standing Single Arm Row with Resistance Thumb Up (Mirrored)  - 1 x daily - 7 x weekly - 1 sets - 10 reps - Single Arm Shoulder Extension with Anchored Resistance (Mirrored)  - 1 x daily - 7 x weekly - 1 sets - 10 reps - Seated Upper Trapezius Stretch  - 1 x daily - 7 x weekly - 1 sets - 2-3 reps - 30 hold - Shoulder Internal Rotation with Resistance  - 1 x daily - 7 x weekly - 1 sets - 10 reps - Standing with Back Flat Against Wall  - 1 x daily - 7 x weekly - 1 sets - 10 reps - Sidelying Shoulder  External Rotation AROM  - 1 x daily - 7 x weekly - 1 sets - 10 reps - Seated Eccentric Abdominal Lean Back  - 1 x daily - 7 x weekly - 1 sets - 10 reps - Supine Shoulder Horizontal Abduction with Resistance  - 1 x daily - 7 x weekly - 1 sets - 5 reps - Supine Shoulder External Rotation with Resistance  - 1 x daily - 7 x weekly - 3 sets - 5 reps  ASSESSMENT:  CLINICAL IMPRESSION: The patient has improved left shoulder ROM in all planes since last seen 11/15.  She also has improved TUG time and along with 5x sit to stand test is above normal per age/gender.  She is very concerned about the ongoing pain in her shoulder and back pain with standing for long periods of time.  We discussed in depth focusing on maintaining her ROM and strength in order to keep her active and independent in ADLs.  We discussed that a shift in mindset would help with pain levels. The patient is reluctant in her independent performance of her HEP.  Will do 1 more follow up in 3 weeks then anticipate readiness for discharge.       OBJECTIVE IMPAIRMENTS decreased activity tolerance, difficulty walking, decreased ROM, decreased strength, impaired perceived functional ability, impaired UE functional use, and pain.   ACTIVITY LIMITATIONS carrying, lifting, standing, bathing, dressing, reach over head, and locomotion level  PARTICIPATION LIMITATIONS: meal prep, cleaning, laundry, and community activity  PERSONAL FACTORS Age, Time since onset of injury/illness/exacerbation, and 1-2 comorbidities: multiple body regions affected, peripheral neuropathy, scoliosis, osteoporosis  are also affecting patient's functional outcome.   REHAB POTENTIAL: Good  CLINICAL DECISION MAKING: moderately complex EVALUATION COMPLEXITY: moderate   GOALS: Goals reviewed with patient? Yes  SHORT TERM GOALS: Target date: 12/11/2021  The patient will demonstrate knowledge of basic self care strategies and exercises to promote healing    Baseline: Goal status:Goal met 12/16/21  2.  The patient will report a 30% improvement in back and shoulder pain levels with functional activities including reaching overhead, walking, standing Baseline:  Goal  status: goal met 11/1 3.  The patient will have improved trunk flexor and extensor muscle strength and hip strength to at least 4/5 needed for lifting medium weight objects such as grocery bags  Baseline:  Goal status: goal met 11/1 4.  The patient will have improved shoulder elevation ROM to at least 122 degrees needed for grooming/dressing purposes as well as reaching high shelves  Baseline:  Goal status: goal met 11/1   LONG TERM GOALS: Target date: 03/05/22  The patient will be independent in a safe self progression of a home exercise program to promote further recovery of function   Baseline:  Goal status: ongoing  2.  The patient will report a 65% improvement in back and shoulder pain levels with functional activities which are currently difficult  including walking, standing, reaching overhead Baseline:  Goal status: ongoing  3.  The patient will have improved shoulder elevation ROM to at least 140 degrees needed for grooming/dressing purposes as well as reaching high shelves  Baseline:  Goal status: ongoing  4.  The patient will have grossly 4/5 strength needed to lift and lower a 1-3# object from a high shelf  Baseline:  Goal status: ongoing  5.  The patient will have improved FOTO score to   63%    indicating improved function with less pain  Baseline:  Goal status: goal met 11/15  6.  5x sit to stand test improved to 15 sec Goal met 12  sec    PLAN: PT FREQUENCY: 2x/week  PT DURATION: 8 weeks  PLANNED INTERVENTIONS: Therapeutic exercises, Therapeutic activity, Neuromuscular re-education, Patient/Family education, Self Care, Joint mobilization, Aquatic Therapy, Dry Needling, Electrical stimulation, Spinal mobilization, Cryotherapy, Moist heat, Traction,  Ultrasound, Manual therapy, and Re-evaluation.  PLAN FOR NEXT SESSION:  Will follow up in  2-3 weeks then anticipate discharge  Ruben Im, PT 01/28/22 2:58 PM Phone: [phone] Fax: [phone]

## 2022-02-05 ENCOUNTER — Encounter: Payer: Self-pay | Admitting: Internal Medicine

## 2022-02-05 ENCOUNTER — Non-Acute Institutional Stay: Payer: Medicare Other | Admitting: Internal Medicine

## 2022-02-05 VITALS — BP 136/72 | HR 66 | Temp 97.6°F | Resp 17 | Ht 60.0 in | Wt 113.4 lb

## 2022-02-05 DIAGNOSIS — F419 Anxiety disorder, unspecified: Secondary | ICD-10-CM

## 2022-02-05 DIAGNOSIS — M545 Low back pain, unspecified: Secondary | ICD-10-CM | POA: Diagnosis not present

## 2022-02-05 DIAGNOSIS — I10 Essential (primary) hypertension: Secondary | ICD-10-CM | POA: Diagnosis not present

## 2022-02-05 DIAGNOSIS — M25512 Pain in left shoulder: Secondary | ICD-10-CM

## 2022-02-05 DIAGNOSIS — E7849 Other hyperlipidemia: Secondary | ICD-10-CM

## 2022-02-05 DIAGNOSIS — G8929 Other chronic pain: Secondary | ICD-10-CM

## 2022-02-05 DIAGNOSIS — N1831 Chronic kidney disease, stage 3a: Secondary | ICD-10-CM

## 2022-02-05 DIAGNOSIS — F332 Major depressive disorder, recurrent severe without psychotic features: Secondary | ICD-10-CM | POA: Diagnosis not present

## 2022-02-05 DIAGNOSIS — H269 Unspecified cataract: Secondary | ICD-10-CM | POA: Insufficient documentation

## 2022-02-06 NOTE — Progress Notes (Signed)
Location:  [facility] (12)  Provider:   Code Status:  Goals of Care:     02/05/2022    2:19 PM  Advanced Directives  Does Patient Have a Medical Advance Directive? Yes  Type of Paramedic of [place];Living will  Copy of [place] in Chart? Yes - validated most recent copy scanned in chart (See row information)     Chief Complaint  Patient presents with   Medical Management of Chronic Issues    3 month follow up patient states she would like to discuss arthritis     HPI: Patient is a 88 y.o. female seen today for medical management of chronic diseases.    Lives in [place] in [place]  H/o  Nausea and abdominal pain EGD in 11/22 Mild inflammation characterized by erythema, friability and granularity was found in the gastric antrum. Manometry in 09/20 Normal in EG junction and no Motility issue CT scan of abdomen in 05/22 and 5/23 negative [place] n05/23 negative   Better since she has been on Ativan   Continues to work with therapy for urinary and bowel incontinence Had detailed work-up by GYN and everything is negative Talks to the therapist online every week Continues to get tired and fatigued very easily  Gets very anxious and depressed because of her daughter who lives here in [place] and her granddaughter.  Also has a son who lives in [place] .  Her other complaint is left shoulder pain.  She worked with therapy but still not able to raise her above the shoulder She states she has pain all over her body and Motrin helps Walks with no assist no falls Past Medical History:  Diagnosis Date   Anxiety    Arthritis    "qwhere; but it doesn't hurt" (05/12/2012)   Aspiration pneumonia ([place]) 03/21/2016   Cataract    left   Compression fracture of first lumbar vertebra ([place]) 07/2016   superior endplate   Depression    Dysphagia    "have had it for 7 yr; got worse 4 days ago"  (05/12/2012)   Dysphagia, pharyngoesophageal phase - suspected 05/10/2012   GERD (gastroesophageal reflux disease)    Hypertension    Insomnia    Lumbago with sciatica, right side    Migraines    "menopause cured them" (05/12/2012)   Osteoporosis    Stage 3 chronic kidney disease ([place])    Uterine prolapse     Past Surgical History:  Procedure Laterality Date   CATARACT EXTRACTION W/ INTRAOCULAR LENS IMPLANT Left ? 2010   ESOPHAGEAL MANOMETRY N/A 11/08/2018   Procedure: ESOPHAGEAL MANOMETRY (EM);  Surgeon: Gatha Mayer, MD;  Location: [facility];  Service: Endoscopy;  Laterality: N/A;   ESOPHAGOGASTRODUODENOSCOPY N/A 05/10/2012   Procedure: ESOPHAGOGASTRODUODENOSCOPY (EGD);  Surgeon: Jerene Bears, MD;  Location: [facility];  Service: Gastroenterology;  Laterality: N/A;   TONSILLECTOMY  1940's    Allergies  Allergen Reactions   Lactose Intolerance (Gi) Other (See Comments)    Gi tract issues   Lactulose Other (See Comments)    Gi tract issues   Sulfa Antibiotics Hives    Childhood allergy     Outpatient Encounter Medications as of 02/05/2022  Medication Sig   amLODipine (NORVASC) 10 MG tablet Take 1 tablet (10 mg total) by mouth daily.   atenolol (TENORMIN) 50 MG tablet Take 1 tablet (50 mg total) by mouth daily.  Cholecalciferol (VITAMIN D3) 25 MCG (1000 UT) CAPS Take by mouth daily.   conjugated estrogens (PREMARIN) vaginal cream Place 1 Applicatorful vaginally daily.   famotidine (PEPCID) 40 MG tablet Take 1 tablet (40 mg total) by mouth 2 (two) times daily.   LORazepam (ATIVAN) 0.5 MG tablet TAKE ONE TABLET BY MOUTH TWICE DAILY AS NEEDED FOR ANXIETY   mirtazapine (REMERON) 15 MG tablet TAKE ONE TABLET BY MOUTH AT BEDTIME   sertraline (ZOLOFT) 20 MG/ML concentrated solution TAKE ONE TEASPOONFUL (5ML) BY MOUTH ONCE DAILY   traZODone (DESYREL) 50 MG tablet Take 1 tablet (50 mg total) by mouth at bedtime.   vitamin B-12 (CYANOCOBALAMIN) 1000 MCG tablet Take 1 tablet  (1,000 mcg total) by mouth daily.   [DISCONTINUED] mirtazapine (REMERON) 30 MG tablet Take 30 mg by mouth at bedtime. Patient states taking 15 mg   No facility-administered encounter medications on file as of 02/05/2022.    Review of Systems:  Review of Systems  Health Maintenance  Topic Date Due   Zoster Vaccines- Shingrix (1 of 2) Never done   DEXA SCAN  Never done   COVID-19 Vaccine (8 - 2023-24 season) 02/16/2022   Medicare Annual Wellness (AWV)  01/10/2023   DTaP/Tdap/Td (2 - Td or Tdap) 12/19/2027   Pneumonia Vaccine 26+ Years old  Completed   INFLUENZA VACCINE  Completed   HPV VACCINES  Aged Out    Physical Exam: Vitals:   02/05/22 1413  BP: 136/72  Pulse: 66  Resp: 17  Temp: 97.6 F (36.4 C)  TempSrc: Temporal  SpO2: 98%  Weight: 113 lb 6.4 oz (51.4 kg)  Height: 5' (1.524 m)   Body mass index is 22.15 kg/m. Physical Exam  Labs reviewed: Basic Metabolic Panel: Recent Labs    03/27/21 1157 06/24/21 1757 07/03/21 1629 10/24/21 0820  NA 142 141  --  142  K 3.7 3.5  --  3.8  CL 104 106  --  104  CO2 32 26  --  31  GLUCOSE 107* 105*  --  92  BUN 24* 16  --  22  CREATININE 0.92 1.00  --  0.95  CALCIUM 9.5 9.6  --  9.5  TSH  --   --  2.050  --    Liver Function Tests: Recent Labs    03/27/21 1157 06/24/21 1757 10/24/21 0820  AST '14 20 13  '$ ALT '16 24 17  '$ ALKPHOS 44 46  --   BILITOT 0.7 0.9 1.2  PROT 6.7 7.1 6.2  ALBUMIN 4.1 4.2  --    No results for input(s): "LIPASE", "AMYLASE" in the last 8760 hours. No results for input(s): "AMMONIA" in the last 8760 hours. CBC: Recent Labs    03/27/21 1157 06/24/21 1757 10/24/21 0820  WBC 8.0 9.3 8.2  NEUTROABS 6.0  --  6,093  HGB 12.9 14.1 13.1  HCT 38.6 43.4 39.7  MCV 88.3 91.8 91.9  PLT 251.0 277 248   Lipid Panel: Recent Labs    10/24/21 0820  CHOL 232*  HDL 46*  LDLCALC 162*  TRIG 118  CHOLHDL 5.0*   Lab Results  Component Value Date   HGBA1C 5.5 10/24/2021    Procedures since  last visit: No results found.  Assessment/Plan 1. Chronic left shoulder pain Has failed therapy Does not want to see Ortho Will let me know if gets worse Avoid Motrin Take Tylenol PRN 2. Chronic bilateral low back pain without sciatica Tylenol  3. Anxiety Ativan   4. Other  hyperlipidemia She has done diet modification Will repeat next visit  5. Primary hypertension Doing well on Tenormin and Norvasc  6. Severe episode of recurrent major depressive disorder, without psychotic features ([place]) On Zoloft and Remeron   7. Stage 3a chronic kidney disease (HCC) stable 8. Chronic nausea Resolved since been on Ativan Also on Pepcid   9. Screening for osteoporosis DEXA scheduled 10 insomnia On trazodone   Labs/tests ordered:  * No order type specified * Next appt:  05/01/2022

## 2022-02-19 ENCOUNTER — Telehealth: Payer: Self-pay

## 2022-02-19 NOTE — Telephone Encounter (Signed)
Patient called and stated that she isscheduled for Bone density Jan. 9th and the order needs to be signed.   I called the [facility] and they stated that the order needs to be cosigned.  Message routed to Dr. Veleta Miners

## 2022-02-20 NOTE — Telephone Encounter (Signed)
I don't see an order in epic to be signed?

## 2022-02-26 ENCOUNTER — Other Ambulatory Visit: Payer: Self-pay | Admitting: Internal Medicine

## 2022-03-02 ENCOUNTER — Other Ambulatory Visit: Payer: Self-pay | Admitting: Internal Medicine

## 2022-03-04 ENCOUNTER — Ambulatory Visit
Admission: RE | Admit: 2022-03-04 | Discharge: 2022-03-04 | Disposition: A | Discharge status: Home / Self Care | Payer: Medicare Other | Source: Ambulatory Visit | Attending: Internal Medicine | Admitting: Internal Medicine

## 2022-03-04 DIAGNOSIS — M8580 Other specified disorders of bone density and structure, unspecified site: Secondary | ICD-10-CM

## 2022-03-04 DIAGNOSIS — Z13820 Encounter for screening for osteoporosis: Secondary | ICD-10-CM

## 2022-03-04 DIAGNOSIS — Z780 Asymptomatic menopausal state: Secondary | ICD-10-CM | POA: Diagnosis not present

## 2022-03-04 DIAGNOSIS — M8589 Other specified disorders of bone density and structure, multiple sites: Secondary | ICD-10-CM

## 2022-03-04 DIAGNOSIS — M810 Age-related osteoporosis without current pathological fracture: Secondary | ICD-10-CM | POA: Diagnosis not present

## 2022-03-04 DIAGNOSIS — M85832 Other specified disorders of bone density and structure, left forearm: Secondary | ICD-10-CM | POA: Diagnosis not present

## 2022-03-05 ENCOUNTER — Ambulatory Visit: Payer: Medicare Other | Attending: Internal Medicine | Admitting: Physical Therapy

## 2022-03-05 DIAGNOSIS — M25512 Pain in left shoulder: Secondary | ICD-10-CM | POA: Insufficient documentation

## 2022-03-05 DIAGNOSIS — M6281 Muscle weakness (generalized): Secondary | ICD-10-CM | POA: Insufficient documentation

## 2022-03-05 DIAGNOSIS — M5459 Other low back pain: Secondary | ICD-10-CM | POA: Diagnosis not present

## 2022-03-05 NOTE — Therapy (Signed)
OUTPATIENT PHYSICAL THERAPY THORACOLUMBAR/SHOULDER DISCHARGE SUMMARY   Patient Name: Savannah Diaz MRN: [id number] DOB:[birth year], 89 y.o., female Today's Date: 03/05/2022        PT End of Session - 03/05/22 1342     Visit Number 17    Date for PT Re-Evaluation 03/05/22    Authorization Type Medicare    Progress Note Due on Visit 49    PT Start Time 1400    PT Stop Time 1440    PT Time Calculation (min) 40 min    Activity Tolerance Patient tolerated treatment well                      Past Medical History:  Diagnosis Date   Anxiety    Arthritis    "qwhere; but it doesn't hurt" (05/12/2012)   Aspiration pneumonia ([place]) 03/21/2016   Cataract    left   Compression fracture of first lumbar vertebra ([place]) 07/2016   superior endplate   Depression    Dysphagia    "have had it for 7 yr; got worse 4 days ago" (05/12/2012)   Dysphagia, pharyngoesophageal phase - suspected 05/10/2012   GERD (gastroesophageal reflux disease)    Hypertension    Insomnia    Lumbago with sciatica, right side    Migraines    "menopause cured them" (05/12/2012)   Osteoporosis    Stage 3 chronic kidney disease ([place])    Uterine prolapse    Past Surgical History:  Procedure Laterality Date   CATARACT EXTRACTION W/ INTRAOCULAR LENS IMPLANT Left ? 2010   ESOPHAGEAL MANOMETRY N/A 11/08/2018   Procedure: ESOPHAGEAL MANOMETRY (EM);  Surgeon: Gatha Mayer, MD;  Location: [facility];  Service: Endoscopy;  Laterality: N/A;   ESOPHAGOGASTRODUODENOSCOPY N/A 05/10/2012   Procedure: ESOPHAGOGASTRODUODENOSCOPY (EGD);  Surgeon: Jerene Bears, MD;  Location: [facility];  Service: Gastroenterology;  Laterality: N/A;   TONSILLECTOMY  1940's   Patient Active Problem List   Diagnosis Date Noted   Cataract 02/05/2022   Nausea 03/27/2020   High risk medication use 08/08/2016   Influenza A 03/22/2016   Fall 03/21/2016   Acute on chronic kidney failure II-III 03/21/2016   Transient alteration of  awareness 05/10/2015   High cholesterol 10/10/2014   Dysphagia, pharyngeal phase 08/11/2012   Hypokalemia 05/12/2012   Dysphagia oropharyngeal and pharyngeal 05/10/2012   Knee pain, bilateral 07/28/2011   Hypertension 04/28/2011   Osteoarthritis 04/28/2011   Agoraphobia 04/28/2011   Health care maintenance 08/06/2010   Actinic keratoses 09/28/2009    PCP: Veleta Miners MD  REFERRING PROVIDER: Veleta Miners MD  REFERRING DIAG: M54,50, G89.29 Chronic bil low back pain without sciatica; left shoulder pain  Rationale for Evaluation and Treatment Rehabilitation  THERAPY DIAG:  Back pain; weakness  ONSET DATE: 09/12/2021  SUBJECTIVE:  SUBJECTIVE STATEMENT: I had a bone density yesterday, I'm osteoporotic.  I think I hurt b/c of the arthritis.  It hurts to turn over in bed.  I can only take ibuprofen 1 day a week.  My back is worse than my shoulder.  When I walk that hurts my back.    PERTINENT HISTORY:  Peripheral neuropathy Incontinence bladder/bowel  Right hand dominant;  scoliosis; osteoporosis; 2018 compression fracture L1 when fell backwards  PAIN:  Are you having pain? Yes NPRS scale: 2/10 Pain location: left shoulder - lateral deltoid and upper trap; back hurts at different times  No back pain right now Aggravating factors: mornings; elevating left arm; reaching for a skillet in cabinet;  walking 2,000 steps on pedometer;  prolonged standing Relieving factors: sitting, lying OK  PRECAUTIONS: None  WEIGHT BEARING RESTRICTIONS No  FALLS:  Has patient fallen in last 6 months? No  LIVING ENVIRONMENT: Lives with: lives with their family and [facility] [place]   OCCUPATION: retired   PLOF: Independent with basic ADLs  PATIENT GOALS want to feel better.  Help shoulder pain; stand in  the kitchen; not have to lie down after a walk   OBJECTIVE:   DIAGNOSTIC FINDINGS:  None recently  PATIENT SURVEYS:  FOTO shoulder 54%  12/25/21:  FOTO shoulder: 59% 11/15:  70%   COGNITION:  Overall cognitive status: Within functional limits for tasks assessed      MUSCLE LENGTH: Hamstrings: Right 50 deg; Left 50 deg Thomas test: Right 0 deg; Left 0 deg  POSTURE: rounded shoulders and decreased lumbar lordosis; mild forward trunk   SHOULDER ROM:  RIGHT:  FLEXION:  140   ABDUCTION:   163  EXTERNAL ROTATION:   85  INTERNAL ROTATION: t8                                  LEFT:    FLEXION: 108     ABDUCTION:     65   EXTERNAL ROTATION:   63    INTERNAL ROTATION: L5  10/4:  115 with golf club; supine 112;  seated flexion 125 degrees:  abduction 93;  external rotation 62; internal rotation L1   12/16/21:RT Shoulder: Flexion 148, Abduction 165, Ext Rot 90, Int Rot T8: Left Shoulder: Flexion 108, Abduction 10 slightly out of plane, Ext rot 62, Internal rot L1  12/25/21:  RT Shoulder: Flexion  165, Abduction  165 , Ext Rot 90, Int Rot T8: Left Shoulder: Flexion 120, Abduction  110 slightly out of plane, Ext rot 76, Internal rotT10  11/15: RT Shoulder: Flexion  156, Abduction  172 , Ext Rot 90, Int Rot T8: Left Shoulder: Flexion 114, Abduction  108 slightly out of plane, Ext rot 62 , Internal rot T10  12/5:  RT Shoulder: Flexion  150, Abduction  174 , Ext Rot 78, Int Rot T6: Left Shoulder: Flexion 120,pain Abduction  123  slightly out of plane, Ext rot 65 , Internal rot T9  1/10: RT Shoulder: Flexion   153 , Abduction  163 , Ext Rot 92 , Int Rot T6: Left Shoulder: Flexion  122 ,pain Abduction 123   slightly out of plane, Ext rot 72 , Internal rot T8  SHOULDER STRENGTH :  left:  Flexion:  4-/5;  ABDUCTION 2+/5, external and internal rotation 4/5            11/15:  left 4/5, abduction 3/5, internal and  external rotation 4/5 12/5:  right grossly 4/5 except abduction 4-/5;  left grossly 4/5  abduction 3/5 1/10:right shoulder grossly 4/5; left grossly 4/5 except abduction 3+/5 LUMBAR ROM:   Active  A/PROM  eval 10/4 11/15 12/5  Flexion 50 50 Able to reach floor easily   Extension '5 8 10   '$ Right lateral flexion 35 35 35   Left lateral flexion 35 35 35   Right rotation      Left rotation       (Blank rows = not tested)  LOWER EXTREMITY ROM:     TRUNK STRENGTH:  Decreased activation of transverse abdominus muscles; abdominals 4-/5; decreased activation of lumbar multifidi; trunk extensors 4-/5   LOWER EXTREMITY MMT:  Able to kneel down on floor and get back up with UE assist on chair;  able to sit to stand without UE assist; hip abduction 4/5   FUNCTIONAL TESTS:  5 times sit to stand: 17 Timed up and go (TUG): 12  no UE use 12/16/21: 5x sit to stand 12 sec  11/15:  TUG 8.57 5x sit to stand 9.51  12/5: 5x sit to stand 9.7 sec TUG  6.82 sec sec  GAIT:  Comments: no assistive device;  WFLs    TODAY'S TREATMENT  1/10: Nu-step L2 x 32mn while discussing status ROM shoulder Review of progress and comparison since start of care Discussion of osteoporosis and benefit of ex and preventing further bone density loss Review of her previous lower body bed routing Review of wand ex's Discussion of ex options at [place]   12/5: Nu-step L3 x 4 min while discussing status Supine cane flexion 5x Supine cane press 5x Supine cane horizontal abduction/adduction 5x Supine external/internal rotation wipers 5x Shoulder ROM measurements Lumbar ROM MMT shoulder TUG 5x sit to stand Discussed results Discussion of World Health organization physical activity requirements 150-300 minutes/week Discussion of using 1# weights 10 reps then after 2-3 weeks increase to 2 sets of 10 Nutritional influences on pain (decrease sugar, processed foods and increase fruits and vegetables)       01/08/22: Nu-step L5 x 4 min while discussing status FOTO TUG 5x  sit to stand Shoulder ROM Lumbar ROM 1# and 2# weight reach to 1st and 2nd shelf Comprehensive review of HEP and discussion on restarting supine elevation and continuing core ex then restarting "medium" shoulder ex's in 1-2 weeks; she is somewhat fearful of theraband from a past experience so we discussed eliminating these from the program    PATIENT EDUCATION:  Education details: Access Code: [email or web address] Person educated: Patient Education method: Explanation, Demonstration, Tactile cues, Verbal cues, and Handouts Education comprehension: verbalized understanding and returned demonstration   HOME EXERCISE PROGRAM: Access Code: [email or web address] URL: [email or web address] Date: 01/01/2022 Prepared by: SRuben Im Exercises - Supine Shoulder Flexion Extension AAROM with Dowel  - 1 x daily - 7 x weekly - 3 sets - 10 reps - Seated Figure 4 Piriformis Stretch  - 1 x daily - 7 x weekly - 1 sets - 3 reps - 30 hold - Hooklying Isometric Hip Flexion  - 1 x daily - 7 x weekly - 2 sets - 5 reps - 5 hold - Seated Abdominal Set  - 1 x daily - 7 x weekly - 1 sets - 10 reps - Seated Thoracic Lumbar Extension  - 1 x daily - 7 x weekly - 1 sets - 10 reps - Hooklying Bent Leg Lifts  - 1 x daily -  7 x weekly - 1 sets - 10 reps - Clamshell  - 1 x daily - 7 x weekly - 1 sets - 10 reps - Sidelying Shoulder Abduction Palm Forward (Mirrored)  - 1 x daily - 7 x weekly - 3 sets - 5 reps - Supine Transversus Abdominis Bracing - Hands on Thighs  - 1 x daily - 7 x weekly - 1 sets - 10 reps - serratus punch  - 1 x daily - 7 x weekly - 1 sets - 10 reps - Sidelying Shoulder External Rotation  - 1 x daily - 7 x weekly - 1 sets - 10 reps - Standing Single Arm Row with Resistance Thumb Up (Mirrored)  - 1 x daily - 7 x weekly - 1 sets - 10 reps - Single Arm Shoulder Extension with Anchored Resistance (Mirrored)  - 1 x daily - 7 x weekly - 1 sets - 10 reps - Seated Upper Trapezius Stretch  - 1 x daily - 7 x  weekly - 1 sets - 2-3 reps - 30 hold - Shoulder Internal Rotation with Resistance  - 1 x daily - 7 x weekly - 1 sets - 10 reps - Standing with Back Flat Against Wall  - 1 x daily - 7 x weekly - 1 sets - 10 reps - Sidelying Shoulder External Rotation AROM  - 1 x daily - 7 x weekly - 1 sets - 10 reps - Seated Eccentric Abdominal Lean Back  - 1 x daily - 7 x weekly - 1 sets - 10 reps - Supine Shoulder Horizontal Abduction with Resistance  - 1 x daily - 7 x weekly - 1 sets - 5 reps - Supine Shoulder External Rotation with Resistance  - 1 x daily - 7 x weekly - 3 sets - 5 reps  ASSESSMENT:  CLINICAL IMPRESSION: The patient has met the majority of rehab goals, with noted improvements in pain reduction, outcome score, ROM, strength and functional mobility.  A comprehensive HEP has been established and anticipate further improvements over time with regular performance of the program.  Recommend discharge from PT at this time.        OBJECTIVE IMPAIRMENTS decreased activity tolerance, difficulty walking, decreased ROM, decreased strength, impaired perceived functional ability, impaired UE functional use, and pain.   ACTIVITY LIMITATIONS carrying, lifting, standing, bathing, dressing, reach over head, and locomotion level  PARTICIPATION LIMITATIONS: meal prep, cleaning, laundry, and community activity  PERSONAL FACTORS Age, Time since onset of injury/illness/exacerbation, and 1-2 comorbidities: multiple body regions affected, peripheral neuropathy, scoliosis, osteoporosis  are also affecting patient's functional outcome.   REHAB POTENTIAL: Good  CLINICAL DECISION MAKING: moderately complex EVALUATION COMPLEXITY: moderate   GOALS: Goals reviewed with patient? Yes  SHORT TERM GOALS: Target date: 12/11/2021  The patient will demonstrate knowledge of basic self care strategies and exercises to promote healing   Baseline: Goal status:Goal met 12/16/21  2.  The patient will report a 30%  improvement in back and shoulder pain levels with functional activities including reaching overhead, walking, standing Baseline:  Goal status: goal met 11/1 3.  The patient will have improved trunk flexor and extensor muscle strength and hip strength to at least 4/5 needed for lifting medium weight objects such as grocery bags  Baseline:  Goal status: goal met 11/1 4.  The patient will have improved shoulder elevation ROM to at least 122 degrees needed for grooming/dressing purposes as well as reaching high shelves  Baseline:  Goal status: goal met  11/1   LONG TERM GOALS: Target date: 03/05/22  The patient will be independent in a safe self progression of a home exercise program to promote further recovery of function   Baseline:  Goal status: MET 2.  The patient will report a 65% improvement in back and shoulder pain levels with functional activities which are currently difficult  including walking, standing, reaching overhead Baseline:  Goal status: partially met  3.  The patient will have improved shoulder elevation ROM to at least 140 degrees needed for grooming/dressing purposes as well as reaching high shelves  Baseline:  Goal status: partially met  4.  The patient will have grossly 4/5 strength needed to lift and lower a 1-3# object from a high shelf  Baseline:  Goal status: partially met  5.  The patient will have improved FOTO score to   63%    indicating improved function with less pain  Baseline:  Goal status: goal met 11/15  6.  5x sit to stand test improved to 15 sec Goal met 12  sec PHYSICAL THERAPY DISCHARGE SUMMARY  Visits from Start of Care: 17  Current functional level related to goals / functional outcomes: See clinical impression above   Remaining deficits: As above   Education / Equipment: HEP   Patient agrees to discharge. Patient goals were met. Patient is being discharged due to meeting the stated rehab goals.  Ruben Im, PT 03/05/22 2:55  PM Phone: [phone] Fax: [phone]

## 2022-03-12 ENCOUNTER — Encounter: Payer: Self-pay | Admitting: Internal Medicine

## 2022-03-12 ENCOUNTER — Non-Acute Institutional Stay: Payer: Medicare Other | Admitting: Internal Medicine

## 2022-03-12 VITALS — BP 138/64 | HR 75 | Temp 97.9°F | Resp 17 | Ht 60.0 in | Wt 111.2 lb

## 2022-03-12 DIAGNOSIS — I10 Essential (primary) hypertension: Secondary | ICD-10-CM

## 2022-03-12 DIAGNOSIS — F332 Major depressive disorder, recurrent severe without psychotic features: Secondary | ICD-10-CM

## 2022-03-12 DIAGNOSIS — E7849 Other hyperlipidemia: Secondary | ICD-10-CM | POA: Diagnosis not present

## 2022-03-12 DIAGNOSIS — M545 Low back pain, unspecified: Secondary | ICD-10-CM | POA: Diagnosis not present

## 2022-03-12 DIAGNOSIS — M810 Age-related osteoporosis without current pathological fracture: Secondary | ICD-10-CM | POA: Diagnosis not present

## 2022-03-12 DIAGNOSIS — F419 Anxiety disorder, unspecified: Secondary | ICD-10-CM

## 2022-03-12 DIAGNOSIS — M6281 Muscle weakness (generalized): Secondary | ICD-10-CM | POA: Diagnosis not present

## 2022-03-12 DIAGNOSIS — M25512 Pain in left shoulder: Secondary | ICD-10-CM

## 2022-03-12 DIAGNOSIS — G8929 Other chronic pain: Secondary | ICD-10-CM

## 2022-03-12 MED ORDER — MIRTAZAPINE 30 MG PO TABS: 30.0000 mg | ORAL_TABLET | Freq: Every day | ORAL | 3 refills | Status: DC

## 2022-03-12 NOTE — Patient Instructions (Signed)
Voltaren gel to Apply on neck 2-3 times a day

## 2022-03-16 NOTE — Progress Notes (Signed)
Location: [facility] (12)  Provider:   Code Status:  Goals of Care:     03/12/2022    3:31 PM  Advanced Directives  Does Patient Have a Medical Advance Directive? Yes  Type of Paramedic of [place];Living will  Does patient want to make changes to medical advance directive? No - Patient declined  Copy of [place] in Chart? Yes - validated most recent copy scanned in chart (See row information)     Chief Complaint  Patient presents with   Acute Visit    Patient states she is here for bone density and discuss getting a referral    HPI: Patient is a 89 y.o. female seen today for an acute visit to discuss her DEXA Depression Fatigue and Pain inher Muscles  [place] in [place]   Acute issue Pain in proximal Muscles and her Joints  Works with therapy Still feel weak  Has lost some weight as she is trying Diet control due to High LDL Wt Readings from Last 3 Encounters:  03/12/22 111 lb 3.2 oz (50.4 kg)  02/05/22 113 lb 6.4 oz (51.4 kg)  10/30/21 115 lb 12.8 oz (52.5 kg)    Depression  Continues to be issue Talks to the therapist online every week Daughter in town son in [place]  Also H/o  Nausea and abdominal pain EGD in 11/22 Mild inflammation characterized by erythema, friability and granularity was found in the gastric antrum. Manometry in 09/20 Normal in EG junction and no Motility issue CT scan of abdomen in 05/22 and 5/23 negative [place] n05/23 negative   Better since she has been on Ativan   Continues to work with therapy for urinary and bowel incontinence Had detailed work-up by GYN and everything is negative   Past Medical History:  Diagnosis Date   Anxiety    Arthritis    "qwhere; but it doesn't hurt" (05/12/2012)   Aspiration pneumonia ([place]) 03/21/2016   Cataract    left   Compression fracture of first lumbar vertebra ([place]) 07/2016   superior endplate    Depression    Dysphagia    "have had it for 7 yr; got worse 4 days ago" (05/12/2012)   Dysphagia, pharyngoesophageal phase - suspected 05/10/2012   GERD (gastroesophageal reflux disease)    Hypertension    Insomnia    Lumbago with sciatica, right side    Migraines    "menopause cured them" (05/12/2012)   Osteoporosis    Stage 3 chronic kidney disease ([place])    Uterine prolapse     Past Surgical History:  Procedure Laterality Date   CATARACT EXTRACTION W/ INTRAOCULAR LENS IMPLANT Left ? 2010   ESOPHAGEAL MANOMETRY N/A 11/08/2018   Procedure: ESOPHAGEAL MANOMETRY (EM);  Surgeon: Gatha Mayer, MD;  Location: [facility];  Service: Endoscopy;  Laterality: N/A;   ESOPHAGOGASTRODUODENOSCOPY N/A 05/10/2012   Procedure: ESOPHAGOGASTRODUODENOSCOPY (EGD);  Surgeon: Jerene Bears, MD;  Location: [facility];  Service: Gastroenterology;  Laterality: N/A;   TONSILLECTOMY  1940's    Allergies  Allergen Reactions   Lactose Intolerance (Gi) Other (See Comments)    Gi tract issues   Lactulose Other (See Comments)    Gi tract issues   Sulfa Antibiotics Hives    Childhood allergy     Outpatient Encounter Medications as of 03/12/2022  Medication Sig   amLODipine (NORVASC) 10 MG tablet TAKE ONE TABLET BY MOUTH DAILY  atenolol (TENORMIN) 50 MG tablet TAKE ONE TABLET BY MOUTH DAILY   Cholecalciferol (VITAMIN D3) 25 MCG (1000 UT) CAPS Take by mouth daily.   conjugated estrogens (PREMARIN) vaginal cream Place 1 Applicatorful vaginally daily.   famotidine (PEPCID) 40 MG tablet Take 1 tablet (40 mg total) by mouth 2 (two) times daily.   LORazepam (ATIVAN) 0.5 MG tablet TAKE ONE TABLET BY MOUTH TWICE DAILY AS NEEDED FOR ANXIETY   mirtazapine (REMERON) 30 MG tablet Take 1 tablet (30 mg total) by mouth at bedtime.   omeprazole (PRILOSEC) 40 MG capsule Take 40 mg by mouth daily.   Psyllium (METAMUCIL 4 IN 1 FIBER) 25 % PACK 1 packet with 8 ounces of liquid as needed Orally Once a day for 30 day(s)    sertraline (ZOLOFT) 20 MG/ML concentrated solution TAKE ONE TEASPOONFUL (5ML) BY MOUTH ONCE DAILY   traZODone (DESYREL) 50 MG tablet TAKE ONE TABLET BY MOUTH AT BEDTIME   vitamin B-12 (CYANOCOBALAMIN) 1000 MCG tablet Take 1 tablet (1,000 mcg total) by mouth daily.   [DISCONTINUED] mirtazapine (REMERON) 15 MG tablet TAKE ONE TABLET BY MOUTH AT BEDTIME   No facility-administered encounter medications on file as of 03/12/2022.    Review of Systems:  Review of Systems  Constitutional:  Positive for appetite change and unexpected weight change. Negative for activity change.  HENT: Negative.    Respiratory:  Negative for cough and shortness of breath.   Cardiovascular:  Negative for leg swelling.  Gastrointestinal:  Negative for constipation.  Genitourinary: Negative.   Musculoskeletal:  Positive for arthralgias and myalgias. Negative for gait problem.  Skin: Negative.   Neurological:  Negative for dizziness and weakness.  Psychiatric/Behavioral:  Positive for dysphoric mood and sleep disturbance. Negative for confusion.     Health Maintenance  Topic Date Due   Zoster Vaccines- Shingrix (1 of 2) Never done   COVID-19 Vaccine (8 - 2023-24 season) 03/28/2022 (Originally 02/16/2022)   Medicare Annual Wellness (AWV)  01/10/2023   DTaP/Tdap/Td (2 - Td or Tdap) 12/19/2027   Pneumonia Vaccine 14+ Years old  Completed   INFLUENZA VACCINE  Completed   DEXA SCAN  Completed   HPV VACCINES  Aged Out    Physical Exam: Vitals:   03/12/22 1527  BP: 138/64  Pulse: 75  Resp: 17  Temp: 97.9 F (36.6 C)  TempSrc: Temporal  SpO2: 99%  Weight: 111 lb 3.2 oz (50.4 kg)  Height: 5' (1.524 m)   Body mass index is 21.72 kg/m. Physical Exam Vitals reviewed.  Constitutional:      Appearance: Normal appearance.  HENT:     Head: Normocephalic.     Nose: Nose normal.     Mouth/Throat:     Mouth: Mucous membranes are moist.     Pharynx: Oropharynx is clear.  Eyes:     Pupils: Pupils are equal,  round, and reactive to light.  Cardiovascular:     Rate and Rhythm: Normal rate and regular rhythm.     Pulses: Normal pulses.     Heart sounds: Normal heart sounds. No murmur heard. Pulmonary:     Effort: Pulmonary effort is normal.     Breath sounds: Normal breath sounds.  Abdominal:     General: Abdomen is flat. Bowel sounds are normal.     Palpations: Abdomen is soft.  Musculoskeletal:        General: No swelling.     Cervical back: Neck supple.  Skin:    General: Skin is warm.  Neurological:  General: No focal deficit present.     Mental Status: She is alert and oriented to person, place, and time.  Psychiatric:        Mood and Affect: Mood normal.        Thought Content: Thought content normal.     Labs reviewed: Basic Metabolic Panel: Recent Labs    03/27/21 1157 06/24/21 1757 07/03/21 1629 10/24/21 0820  NA 142 141  --  142  K 3.7 3.5  --  3.8  CL 104 106  --  104  CO2 32 26  --  31  GLUCOSE 107* 105*  --  92  BUN 24* 16  --  22  CREATININE 0.92 1.00  --  0.95  CALCIUM 9.5 9.6  --  9.5  TSH  --   --  2.050  --    Liver Function Tests: Recent Labs    03/27/21 1157 06/24/21 1757 10/24/21 0820  AST '14 20 13  '$ ALT '16 24 17  '$ ALKPHOS 44 46  --   BILITOT 0.7 0.9 1.2  PROT 6.7 7.1 6.2  ALBUMIN 4.1 4.2  --    No results for input(s): "LIPASE", "AMYLASE" in the last 8760 hours. No results for input(s): "AMMONIA" in the last 8760 hours. CBC: Recent Labs    03/27/21 1157 06/24/21 1757 10/24/21 0820  WBC 8.0 9.3 8.2  NEUTROABS 6.0  --  6,093  HGB 12.9 14.1 13.1  HCT 38.6 43.4 39.7  MCV 88.3 91.8 91.9  PLT 251.0 277 248   Lipid Panel: Recent Labs    10/24/21 0820  CHOL 232*  HDL 46*  LDLCALC 162*  TRIG 118  CHOLHDL 5.0*   Lab Results  Component Value Date   HGBA1C 5.5 10/24/2021    Procedures since last visit: DG BONE DENSITY (DXA)  Result Date: 03/04/2022 EXAM: DUAL X-RAY ABSORPTIOMETRY (DXA) FOR BONE MINERAL DENSITY IMPRESSION:  Referring Physician:  Virgie Dad Your patient completed a bone mineral density test using GE Lunar iDXA system (analysis version: 16). Technologist: sec PATIENT: Name: Dulcey, Riederer Patient ID: [id number] Birth Date: [birth year] Height: 59.5 in. Sex: Female Measured: 03/04/2022 Weight: 112.0 lbs. Indications: Advanced Age, Estrogen Deficient, Family Hist. (Parent hip fracture), Postmenopausal, Scoliosis Fractures: Vertebrae Treatments: Vitamin D (E933.5) ASSESSMENT: The BMD measured at Femur Neck Right is 0.573 g/cm2 with a T-score of -3.3. This patient is considered osteoporotic according to [facility]) criteria. The quality of the exam is good. L1 was excluded due to degenerative changes. Site Region Measured Date Measured Age YA BMD Significant CHANGE T-score DualFemur Neck Right 03/04/2022 87.2 -3.3 0.573 g/cm2 AP Spine L2-L4 03/04/2022 87.2 -0.3 1.160 g/cm2 DualFemur Total Mean 03/04/2022 87.2 -2.3 0.714 g/cm2 Left Forearm Radius 33% 03/04/2022 87.2 -1.5 0.747 g/cm2 World Health Organization [facility]) criteria for post-menopausal, Caucasian Women: Normal       T-score at or above -1 SD Osteopenia   T-score between -1 and -2.5 SD Osteoporosis T-score at or below -2.5 SD RECOMMENDATION: 1. All patients should optimize calcium and vitamin D intake. 2. Consider FDA-approved medical therapies in postmenopausal women and men aged 67 years and older, based on the following: a. A hip or vertebral (clinical or morphometric) fracture. b. T-score = -2.5 at the femoral neck or spine after appropriate evaluation to exclude secondary causes. c. Low bone mass (T-score between -1.0 and -2.5 at the femoral neck or spine) and a 10-year probability of a hip fracture = 3% or a 10-year probability of  a major osteoporosis-related fracture = 20% based on the [place]-adapted WHO algorithm. d. Clinician judgment and/or patient preferences may indicate treatment for people with 10-year fracture probabilities above or below  these levels. FOLLOW-UP: Patients with diagnosis of osteoporosis or at high risk for fracture should have regular bone mineral density tests.? Patients eligible for Medicare are allowed routine testing every 2 years.? The testing frequency can be increased to one year for patients who have rapidly progressing disease, are receiving or discontinuing medical therapy to restore bone mass, or have additional risk factors. I have reviewed this study and agree with the findings. [facility] Electronically Signed   By: Dorise Bullion III M.D.   On: 03/04/2022 14:48    Assessment/Plan 1. Proximal muscle weakness  - TSH; Future - CK; Future - C-reactive Protein; Future - Rheumatoid Factor; Future  2. Chronic left shoulder pain Works with therapy Continues to have issue ? Ortho   3. Chronic bilateral low back pain without sciatica Therapy Voltaren PRN  4. Anxiety Ativan Zoloft Change Remeron to 30 mg QHS  5. Other hyperlipidemia Diet Control - Lipid panel; Future - COMPLETE METABOLIC PANEL WITH GFR; Future - CBC with Differential/Platelet; Future  6. Primary hypertension Tenormin and Norvasc  7. Severe episode of recurrent major depressive disorder, without psychotic features ([place]) Ativan,Zoloft  Change Remeron to 30 mg  8. Age-related osteoporosis without current pathological fracture DEXA Discussed  Right Femur -3.3 Talked about Fosamax she does not want to do it due to her GERD Prolia Literature given - TSH; Future  9 Chronic nausea Resolved since been on Ativan Also on Pepcid 10 insomnia Trazodone and Remeron   Labs/tests ordered:   Next appt:  03/20/2022

## 2022-03-20 ENCOUNTER — Other Ambulatory Visit: Payer: Medicare Other

## 2022-03-20 DIAGNOSIS — M6281 Muscle weakness (generalized): Secondary | ICD-10-CM | POA: Diagnosis not present

## 2022-03-20 DIAGNOSIS — E7849 Other hyperlipidemia: Secondary | ICD-10-CM

## 2022-03-20 DIAGNOSIS — G8929 Other chronic pain: Secondary | ICD-10-CM | POA: Diagnosis not present

## 2022-03-20 DIAGNOSIS — M25512 Pain in left shoulder: Secondary | ICD-10-CM | POA: Diagnosis not present

## 2022-03-20 DIAGNOSIS — M810 Age-related osteoporosis without current pathological fracture: Secondary | ICD-10-CM

## 2022-03-21 LAB — COMPLETE METABOLIC PANEL WITH GFR
AG Ratio: 1.8 (calc) (ref 1.0–2.5)
ALT: 14 U/L (ref 6–29)
AST: 13 U/L (ref 10–35)
Albumin: 4.2 g/dL (ref 3.6–5.1)
Alkaline phosphatase (APISO): 42 U/L (ref 37–153)
BUN: 25 mg/dL (ref 7–25)
CO2: 25 mmol/L (ref 20–32)
Calcium: 9.5 mg/dL (ref 8.6–10.4)
Chloride: 107 mmol/L (ref 98–110)
Creat: 0.88 mg/dL (ref 0.60–0.95)
Globulin: 2.4 g/dL (calc) (ref 1.9–3.7)
Glucose, Bld: 103 mg/dL — ABNORMAL HIGH (ref 65–99)
Potassium: 3.6 mmol/L (ref 3.5–5.3)
Sodium: 143 mmol/L (ref 135–146)
Total Bilirubin: 0.6 mg/dL (ref 0.2–1.2)
Total Protein: 6.6 g/dL (ref 6.1–8.1)
eGFR: 64 mL/min/{1.73_m2} (ref >=60)

## 2022-03-21 LAB — CBC WITH DIFFERENTIAL/PLATELET
Absolute Monocytes: 659 cells/uL (ref 200–950)
Basophils Absolute: 31 cells/uL (ref 0–200)
Basophils Relative: 0.3 %
Eosinophils Absolute: 237 cells/uL (ref 15–500)
Eosinophils Relative: 2.3 %
HCT: 36.9 % (ref 35.0–45.0)
Hemoglobin: 12.2 g/dL (ref 11.7–15.5)
Lymphs Abs: 1102 cells/uL (ref 850–3900)
MCH: 29.8 pg (ref 27.0–33.0)
MCHC: 33.1 g/dL (ref 32.0–36.0)
MCV: 90 fL (ref 80.0–100.0)
MPV: 9.5 fL (ref 7.5–12.5)
Monocytes Relative: 6.4 %
Neutro Abs: 8271 cells/uL — ABNORMAL HIGH (ref 1500–7800)
Neutrophils Relative %: 80.3 %
Platelets: 306 10*3/uL (ref 140–400)
RBC: 4.1 10*6/uL (ref 3.80–5.10)
RDW: 12.7 % (ref 11.0–15.0)
Total Lymphocyte: 10.7 %
WBC: 10.3 10*3/uL (ref 3.8–10.8)

## 2022-03-21 LAB — LIPID PANEL
Cholesterol: 185 mg/dL (ref <200)
HDL: 42 mg/dL — ABNORMAL LOW (ref >=50)
LDL Cholesterol (Calc): 124 mg/dL (calc) — ABNORMAL HIGH
Non-HDL Cholesterol (Calc): 143 mg/dL (calc) — ABNORMAL HIGH (ref <130)
Total CHOL/HDL Ratio: 4.4 (calc) (ref <5.0)
Triglycerides: 90 mg/dL (ref <150)

## 2022-03-21 LAB — CK: Total CK: 55 U/L (ref 29–143)

## 2022-03-21 LAB — RHEUMATOID FACTOR: Rheumatoid fact SerPl-aCnc: <14 IU/mL (ref <14)

## 2022-03-21 LAB — TSH: TSH: 1.6 mIU/L (ref 0.40–4.50)

## 2022-03-21 LAB — C-REACTIVE PROTEIN: CRP: 15.2 mg/L — ABNORMAL HIGH (ref <8.0)

## 2022-03-26 ENCOUNTER — Encounter: Payer: Self-pay | Admitting: Internal Medicine

## 2022-04-02 ENCOUNTER — Encounter: Payer: Self-pay | Admitting: Internal Medicine

## 2022-04-02 ENCOUNTER — Non-Acute Institutional Stay: Payer: Medicare Other | Admitting: Internal Medicine

## 2022-04-02 VITALS — BP 122/78 | HR 92 | Temp 97.6°F | Resp 17 | Ht 60.0 in | Wt 109.4 lb

## 2022-04-02 DIAGNOSIS — M25512 Pain in left shoulder: Secondary | ICD-10-CM

## 2022-04-02 DIAGNOSIS — M6281 Muscle weakness (generalized): Secondary | ICD-10-CM

## 2022-04-02 DIAGNOSIS — F419 Anxiety disorder, unspecified: Secondary | ICD-10-CM | POA: Diagnosis not present

## 2022-04-02 DIAGNOSIS — M545 Low back pain, unspecified: Secondary | ICD-10-CM | POA: Diagnosis not present

## 2022-04-02 DIAGNOSIS — R7982 Elevated C-reactive protein (CRP): Secondary | ICD-10-CM | POA: Diagnosis not present

## 2022-04-02 DIAGNOSIS — G8929 Other chronic pain: Secondary | ICD-10-CM

## 2022-04-02 NOTE — Patient Instructions (Signed)
I am Making Referral to [facility] to rule out Polymyalgia Rheumatica

## 2022-04-02 NOTE — Progress Notes (Signed)
Location: [facility] (12)  Provider:   Code Status:  Goals of Care:     04/02/2022    3:17 PM  Advanced Directives  Does Patient Have a Medical Advance Directive? Yes  Type of Paramedic of [place];Living will  Does patient want to make changes to medical advance directive? No - Patient declined  Copy of [place] in Chart? Yes - validated most recent copy scanned in chart (See row information)     Chief Complaint  Patient presents with   Acute Visit    Patient states has some body aches and pain and now some depression    HPI: Patient is a 89 y.o. female seen today for an acute visit for Follow up of her Pain In her Body Including her Shoulder Back Thighs and Legs  [place] in [place]   Acute issue Pain in proximal Muscles and her Joints  Continues to be her issue No Swelling Walks with no assist No falls Stiffness in the morning when she gets up Depression  Remeron changed to 30 mg Still stays depressed Talks to the therapist online every week Daughter in town son in [place] weight loss recently [employer] Readings from Last 3 Encounters:  04/02/22 109 lb 6.4 oz (49.6 kg)  03/12/22 111 lb 3.2 oz (50.4 kg)  02/05/22 113 lb 6.4 oz (51.4 kg)    Other issues Also H/o  Nausea and abdominal pain EGD in 11/22 Mild inflammation characterized by erythema, friability and granularity was found in the gastric antrum. Manometry in 09/20 Normal in EG junction and no Motility issue CT scan of abdomen in 05/22 and 5/23 negative [place] n05/23 negative   Better since she has been on Ativan   Continues to work with therapy for urinary and bowel incontinence Had detailed work-up by GYN and everything is negative  Past Medical History:  Diagnosis Date   Anxiety    Arthritis    "qwhere; but it doesn't hurt" (05/12/2012)   Aspiration pneumonia ([place]) 03/21/2016   Cataract    left    Compression fracture of first lumbar vertebra ([place]) 07/2016   superior endplate   Depression    Dysphagia    "have had it for 7 yr; got worse 4 days ago" (05/12/2012)   Dysphagia, pharyngoesophageal phase - suspected 05/10/2012   GERD (gastroesophageal reflux disease)    Hypertension    Insomnia    Lumbago with sciatica, right side    Migraines    "menopause cured them" (05/12/2012)   Osteoporosis    Stage 3 chronic kidney disease ([place])    Uterine prolapse     Past Surgical History:  Procedure Laterality Date   CATARACT EXTRACTION W/ INTRAOCULAR LENS IMPLANT Left ? 2010   ESOPHAGEAL MANOMETRY N/A 11/08/2018   Procedure: ESOPHAGEAL MANOMETRY (EM);  Surgeon: Gatha Mayer, MD;  [facility]: [facility];  Service: Endoscopy;  Laterality: N/A;   ESOPHAGOGASTRODUODENOSCOPY N/A 05/10/2012   Procedure: ESOPHAGOGASTRODUODENOSCOPY (EGD);  Surgeon: Jerene Bears, MD;  Location: [facility];  Service: Gastroenterology;  Laterality: N/A;   TONSILLECTOMY  1940's    Allergies  Allergen Reactions   Lactose Intolerance (Gi) Other (See Comments)    Gi tract issues   Lactulose Other (See Comments)    Gi tract issues   Lactose     Other Reaction(s): GI   Sulfa Antibiotics Hives    Childhood allergy  Outpatient Encounter Medications as of 04/02/2022  Medication Sig   amLODipine (NORVASC) 10 MG tablet TAKE ONE TABLET BY MOUTH DAILY   atenolol (TENORMIN) 50 MG tablet TAKE ONE TABLET BY MOUTH DAILY   Cholecalciferol (VITAMIN D3) 25 MCG (1000 UT) CAPS Take by mouth daily.   conjugated estrogens (PREMARIN) vaginal cream Place 1 Applicatorful vaginally daily.   famotidine (PEPCID) 40 MG tablet Take 1 tablet (40 mg total) by mouth 2 (two) times daily.   LORazepam (ATIVAN) 0.5 MG tablet TAKE ONE TABLET BY MOUTH TWICE DAILY AS NEEDED FOR ANXIETY   mirtazapine (REMERON) 30 MG tablet Take 1 tablet (30 mg total) by mouth at bedtime.   omeprazole (PRILOSEC) 40 MG capsule Take 40 mg by mouth daily.    Psyllium (METAMUCIL 4 IN 1 FIBER) 25 % PACK 1 packet with 8 ounces of liquid as needed Orally Once a day for 30 day(s)   sertraline (ZOLOFT) 20 MG/ML concentrated solution TAKE ONE TEASPOONFUL (5ML) BY MOUTH ONCE DAILY   traZODone (DESYREL) 50 MG tablet TAKE ONE TABLET BY MOUTH AT BEDTIME   vitamin B-12 (CYANOCOBALAMIN) 1000 MCG tablet Take 1 tablet (1,000 mcg total) by mouth daily.   No facility-administered encounter medications on file as of 04/02/2022.    Review of Systems:  Review of Systems  Constitutional:  Positive for activity change and appetite change.  HENT: Negative.    Respiratory:  Negative for cough and shortness of breath.   Cardiovascular:  Negative for leg swelling.  Gastrointestinal:  Negative for constipation.  Genitourinary: Negative.   Musculoskeletal:  Positive for arthralgias, myalgias and neck stiffness. Negative for gait problem.  Skin: Negative.   Neurological:  Negative for dizziness and weakness.  Psychiatric/Behavioral:  Positive for dysphoric mood. Negative for confusion and sleep disturbance.     Health Maintenance  Topic Date Due   Zoster Vaccines- Shingrix (1 of 2) Never done   COVID-19 Vaccine (8 - 2023-24 season) 04/18/2022 (Originally 02/16/2022)   Medicare Annual Wellness (AWV)  01/10/2023   DTaP/Tdap/Td (2 - Td or Tdap) 12/19/2027   Pneumonia Vaccine 53+ Years old  Completed   INFLUENZA VACCINE  Completed   DEXA SCAN  Completed   HPV VACCINES  Aged Out    Physical Exam: Vitals:   04/02/22 1512  BP: 122/78  Pulse: 92  Resp: 17  Temp: 97.6 F (36.4 C)  TempSrc: Temporal  SpO2: 97%  Weight: 109 lb 6.4 oz (49.6 kg)  Height: 5' (1.524 m)   Body mass index is 21.37 kg/m. Physical Exam Vitals reviewed.  Constitutional:      Appearance: Normal appearance.  HENT:     Head: Normocephalic.     Nose: Nose normal.     Mouth/Throat:     Mouth: Mucous membranes are moist.     Pharynx: Oropharynx is clear.  Eyes:     Pupils: Pupils are  equal, round, and reactive to light.  Cardiovascular:     Rate and Rhythm: Normal rate and regular rhythm.     Pulses: Normal pulses.     Heart sounds: Normal heart sounds. No murmur heard. Pulmonary:     Effort: Pulmonary effort is normal.     Breath sounds: Normal breath sounds.  Abdominal:     General: Abdomen is flat. Bowel sounds are normal.     Palpations: Abdomen is soft.  Musculoskeletal:        General: No swelling.     Cervical back: Neck supple.  Skin:  General: Skin is warm.  Neurological:     General: No focal deficit present.     Mental Status: She is alert and oriented to person, place, and time.  Psychiatric:        Mood and Affect: Mood normal.        Thought Content: Thought content normal.     Comments: Depressed     Labs reviewed: Basic Metabolic Panel: Recent Labs    06/24/21 1757 07/03/21 1629 10/24/21 0820 03/20/22 0800  NA 141  --  142 143  K 3.5  --  3.8 3.6  CL 106  --  104 107  CO2 26  --  31 25  GLUCOSE 105*  --  92 103*  BUN 16  --  22 25  CREATININE 1.00  --  0.95 0.88  CALCIUM 9.6  --  9.5 9.5  TSH  --  2.050  --  1.60   Liver Function Tests: Recent Labs    06/24/21 1757 10/24/21 0820 03/20/22 0800  AST '20 13 13  '$ ALT '24 17 14  '$ ALKPHOS 46  --   --   BILITOT 0.9 1.2 0.6  PROT 7.1 6.2 6.6  ALBUMIN 4.2  --   --    No results for input(s): "LIPASE", "AMYLASE" in the last 8760 hours. No results for input(s): "AMMONIA" in the last 8760 hours. CBC: Recent Labs    06/24/21 1757 10/24/21 0820 03/20/22 0800  WBC 9.3 8.2 10.3  NEUTROABS  --  6,093 8,271*  HGB 14.1 13.1 12.2  HCT 43.4 39.7 36.9  MCV 91.8 91.9 90.0  PLT 277 248 306   Lipid Panel: Recent Labs    10/24/21 0820 03/20/22 0800  CHOL 232* 185  HDL 46* 42*  LDLCALC 162* 124*  TRIG 118 90  CHOLHDL 5.0* 4.4   Lab Results  Component Value Date   HGBA1C 5.5 10/24/2021    Procedures since last visit: DG BONE DENSITY (DXA)  Result Date: 03/04/2022 EXAM:  DUAL X-RAY ABSORPTIOMETRY (DXA) FOR BONE MINERAL DENSITY IMPRESSION: Referring Physician:  Virgie Dad Your patient completed a bone mineral density test using GE Lunar iDXA system (analysis version: 16). Technologist: sec PATIENT: Name: Savannah Diaz, Savannah Diaz Patient ID: [id number] Birth Date: [birth year] Height: 59.5 in. Sex: Female Measured: 03/04/2022 Weight: 112.0 lbs. Indications: Advanced Age, Estrogen Deficient, Family Hist. (Parent hip fracture), Postmenopausal, Scoliosis Fractures: Vertebrae Treatments: Vitamin D (E933.5) ASSESSMENT: The BMD measured at Femur Neck Right is 0.573 g/cm2 with a T-score of -3.3. This patient is considered osteoporotic according to [facility]) criteria. The quality of the exam is good. L1 was excluded due to degenerative changes. Site Region Measured Date Measured Age YA BMD Significant CHANGE T-score DualFemur Neck Right 03/04/2022 87.2 -3.3 0.573 g/cm2 AP Spine L2-L4 03/04/2022 87.2 -0.3 1.160 g/cm2 DualFemur Total Mean 03/04/2022 87.2 -2.3 0.714 g/cm2 Left Forearm Radius 33% 03/04/2022 87.2 -1.5 0.747 g/cm2 World Health Organization [facility]) criteria for post-menopausal, Caucasian Women: Normal       T-score at or above -1 SD Osteopenia   T-score between -1 and -2.5 SD Osteoporosis T-score at or below -2.5 SD RECOMMENDATION: 1. All patients should optimize calcium and vitamin D intake. 2. Consider FDA-approved medical therapies in postmenopausal women and men aged 91 years and older, based on the following: a. A hip or vertebral (clinical or morphometric) fracture. b. T-score = -2.5 at the femoral neck or spine after appropriate evaluation to exclude secondary causes. c. Low bone mass (T-score  between -1.0 and -2.5 at the femoral neck or spine) and a 10-year probability of a hip fracture = 3% or a 10-year probability of a major osteoporosis-related fracture = 20% based on the US-adapted WHO algorithm. d. Clinician judgment and/or patient preferences may indicate  treatment for people with 10-year fracture probabilities above or below these levels. FOLLOW-UP: Patients with diagnosis of osteoporosis or at high risk for fracture should have regular bone mineral density tests.? Patients eligible for Medicare are allowed routine testing every 2 years.? The testing frequency can be increased to one year for patients who have rapidly progressing disease, are receiving or discontinuing medical therapy to restore bone mass, or have additional risk factors. I have reviewed this study and agree with the findings. [facility] Electronically Signed   By: Dorise Bullion III M.D.   On: 03/04/2022 14:48    Assessment/Plan 1. Proximal muscle weakness CRP Elevated Possible PMR like Symptoms - Ambulatory referral to Rheumatology  2. Chronic left shoulder pain Has worked with therapy Tylenol not helping Will see what Rheumatology says ? Ortho after that    3. Chronic bilateral low back pain without sciatica Tylenol for now  4. Anxiety with Severe Depression Ativan,Zoloft and Remeron  5. Elevated C-reactive protein (CRP)  - Ambulatory referral to Rheumatology 6 HLD Diet Controlled Do not want to do Statin due to her Myalgias 7 Age-related osteoporosis without current pathological fracture DEXA Discussed  Right Femur -3.3 Talked about Fosamax she does not want to do it due to her GERD Prolia Literature given Will discuss with Rheumatology especially if she will be on Steroids 8 Chronic nausea Resolved since been on Ativan Also on Pepcid 9 insomnia Trazodone and Remeron   Labs/tests ordered:  * No order type specified * Next appt:  04/09/2022

## 2022-04-09 ENCOUNTER — Encounter: Payer: Medicare Other | Admitting: Internal Medicine

## 2022-04-23 ENCOUNTER — Encounter: Payer: Self-pay | Admitting: Internal Medicine

## 2022-04-23 ENCOUNTER — Non-Acute Institutional Stay: Payer: Medicare Other | Admitting: Internal Medicine

## 2022-04-23 VITALS — BP 148/86 | HR 80 | Temp 97.8°F | Resp 17 | Ht 60.0 in | Wt 109.8 lb

## 2022-04-23 DIAGNOSIS — M545 Low back pain, unspecified: Secondary | ICD-10-CM | POA: Diagnosis not present

## 2022-04-23 DIAGNOSIS — R7982 Elevated C-reactive protein (CRP): Secondary | ICD-10-CM | POA: Diagnosis not present

## 2022-04-23 DIAGNOSIS — G8929 Other chronic pain: Secondary | ICD-10-CM | POA: Diagnosis not present

## 2022-04-23 DIAGNOSIS — F419 Anxiety disorder, unspecified: Secondary | ICD-10-CM | POA: Diagnosis not present

## 2022-04-23 DIAGNOSIS — M25512 Pain in left shoulder: Secondary | ICD-10-CM | POA: Diagnosis not present

## 2022-04-23 DIAGNOSIS — M6281 Muscle weakness (generalized): Secondary | ICD-10-CM | POA: Diagnosis not present

## 2022-04-23 MED ORDER — PREDNISONE 10 MG PO TABS: 10.0000 mg | ORAL_TABLET | Freq: Every day | ORAL | 0 refills | Status: DC

## 2022-04-23 NOTE — Progress Notes (Addendum)
Location: [facility] (12)  Provider:   Code Status:  Goals of Care:     04/23/2022    2:14 PM  Advanced Directives  Does Patient Have a Medical Advance Directive? Yes  Type of Paramedic of [place];Living will  Does patient want to make changes to medical advance directive? No - Patient declined  Copy of [place] in Chart? Yes - validated most recent copy scanned in chart (See row information)     Chief Complaint  Patient presents with   Acute Visit    Patient would like to discuss some pain she is currently having all over    Immunizations    Discussed the need for shingles vaccine     HPI: Patient is a 89 y.o. female seen today for an acute visit for Proximal Muscle weakness  [place] in [place]   Acute issue Pain in proximal Muscles and her Joints  CRP Elevated Possible PMR Made rheumatology Referral but they cannot see her for another 2 months Patient interested in trying Prednisone  Other issues Depression  Remeron changed to 30 mg Still stays depressed Talks to the therapist online every week Daughter in town son in [place] Some weight loss recently    Also H/o  Nausea and abdominal pain EGD in 11/22 Mild inflammation characterized by erythema, friability and granularity was found in the gastric antrum. Manometry in 09/20 Normal in EG junction and no Motility issue CT scan of abdomen in 05/22 and 5/23 negative [place] n05/23 negative   Better since she has been on Ativan   Continues to work with therapy for urinary and bowel incontinence Had detailed work-up by GYN and everything is negative  Past Medical History:  Diagnosis Date   Anxiety    Arthritis    "qwhere; but it doesn't hurt" (05/12/2012)   Aspiration pneumonia ([place]) 03/21/2016   Cataract    left   Compression fracture of first lumbar vertebra ([place]) 07/2016   superior endplate   Depression     Dysphagia    "have had it for 7 yr; got worse 4 days ago" (05/12/2012)   Dysphagia, pharyngoesophageal phase - suspected 05/10/2012   GERD (gastroesophageal reflux disease)    Hypertension    Insomnia    Lumbago with sciatica, right side    Migraines    "menopause cured them" (05/12/2012)   Osteoporosis    Stage 3 chronic kidney disease ([place])    Uterine prolapse     Past Surgical History:  Procedure Laterality Date   CATARACT EXTRACTION W/ INTRAOCULAR LENS IMPLANT Left ? 2010   ESOPHAGEAL MANOMETRY N/A 11/08/2018   Procedure: ESOPHAGEAL MANOMETRY (EM);  Surgeon: Gatha Mayer, MD;  Location: [facility];  Service: Endoscopy;  Laterality: N/A;   ESOPHAGOGASTRODUODENOSCOPY N/A 05/10/2012   Procedure: ESOPHAGOGASTRODUODENOSCOPY (EGD);  Surgeon: Jerene Bears, MD;  Location: [facility];  Service: Gastroenterology;  Laterality: N/A;   TONSILLECTOMY  1940's    Allergies  Allergen Reactions   Lactose Intolerance (Gi) Other (See Comments)    Gi tract issues   Lactulose Other (See Comments)    Gi tract issues   Lactose     Other Reaction(s): GI   Sulfa Antibiotics Hives    Childhood allergy     Outpatient Encounter Medications as of 04/23/2022  Medication Sig   amLODipine (NORVASC) 10 MG tablet TAKE ONE TABLET BY MOUTH DAILY   atenolol (  TENORMIN) 50 MG tablet TAKE ONE TABLET BY MOUTH DAILY   Cholecalciferol (VITAMIN D3) 25 MCG (1000 UT) CAPS Take by mouth daily.   conjugated estrogens (PREMARIN) vaginal cream Place 1 Applicatorful vaginally daily.   famotidine (PEPCID) 40 MG tablet Take 1 tablet (40 mg total) by mouth 2 (two) times daily.   LORazepam (ATIVAN) 0.5 MG tablet TAKE ONE TABLET BY MOUTH TWICE DAILY AS NEEDED FOR ANXIETY   mirtazapine (REMERON) 30 MG tablet Take 1 tablet (30 mg total) by mouth at bedtime.   omeprazole (PRILOSEC) 40 MG capsule Take 40 mg by mouth daily.   predniSONE (DELTASONE) 10 MG tablet Take 1 tablet (10 mg total) by mouth daily with breakfast.    Psyllium (METAMUCIL 4 IN 1 FIBER) 25 % PACK 1 packet with 8 ounces of liquid as needed Orally Once a day for 30 day(s)   sertraline (ZOLOFT) 20 MG/ML concentrated solution TAKE ONE TEASPOONFUL (5ML) BY MOUTH ONCE DAILY   traZODone (DESYREL) 50 MG tablet TAKE ONE TABLET BY MOUTH AT BEDTIME   vitamin B-12 (CYANOCOBALAMIN) 1000 MCG tablet Take 1 tablet (1,000 mcg total) by mouth daily.   No facility-administered encounter medications on file as of 04/23/2022.    Review of Systems:  Review of Systems  Constitutional:  Positive for activity change and appetite change.  HENT: Negative.    Respiratory:  Negative for cough and shortness of breath.   Cardiovascular:  Negative for leg swelling.  Gastrointestinal:  Negative for constipation.  Genitourinary: Negative.   Musculoskeletal:  Positive for arthralgias and myalgias. Negative for gait problem.  Skin: Negative.   Neurological:  Negative for dizziness and weakness.  Psychiatric/Behavioral:  Positive for dysphoric mood and sleep disturbance. Negative for confusion. The patient is nervous/anxious.     Health Maintenance  Topic Date Due   Zoster Vaccines- Shingrix (1 of 2) Never done   COVID-19 Vaccine (8 - 2023-24 season) 05/08/2022 (Originally 02/16/2022)   Medicare Annual Wellness (AWV)  01/10/2023   DTaP/Tdap/Td (2 - Td or Tdap) 12/19/2027   Pneumonia Vaccine 27+ Years old  Completed   INFLUENZA VACCINE  Completed   DEXA SCAN  Completed   HPV VACCINES  Aged Out    Physical Exam: Vitals:   04/23/22 1412 04/23/22 1415  BP: (!) 152/86 (!) 148/86  Pulse: 80   Resp: 17   Temp: 97.8 F (36.6 C)   TempSrc: Temporal   SpO2: 98%   Weight: 109 lb 12.8 oz (49.8 kg)   Height: 5' (1.524 m)    Body mass index is 21.44 kg/m. Physical Exam Vitals reviewed.  Constitutional:      Appearance: Normal appearance.  HENT:     Head: Normocephalic.     Nose: Nose normal.     Mouth/Throat:     Mouth: Mucous membranes are moist.      Pharynx: Oropharynx is clear.  Eyes:     Pupils: Pupils are equal, round, and reactive to light.  Cardiovascular:     Rate and Rhythm: Normal rate and regular rhythm.     Pulses: Normal pulses.     Heart sounds: Normal heart sounds. No murmur heard. Pulmonary:     Effort: Pulmonary effort is normal.     Breath sounds: Normal breath sounds.  Abdominal:     General: Abdomen is flat. Bowel sounds are normal.     Palpations: Abdomen is soft.  Musculoskeletal:        General: No swelling.     Cervical back:  Neck supple.  Skin:    General: Skin is warm.  Neurological:     General: No focal deficit present.     Mental Status: She is alert and oriented to person, place, and time.  Psychiatric:        Mood and Affect: Mood normal.        Thought Content: Thought content normal.     Labs reviewed: Basic Metabolic Panel: Recent Labs    06/24/21 1757 07/03/21 1629 10/24/21 0820 03/20/22 0800  NA 141  --  142 143  K 3.5  --  3.8 3.6  CL 106  --  104 107  CO2 26  --  31 25  GLUCOSE 105*  --  92 103*  BUN 16  --  22 25  CREATININE 1.00  --  0.95 0.88  CALCIUM 9.6  --  9.5 9.5  TSH  --  2.050  --  1.60   Liver Function Tests: Recent Labs    06/24/21 1757 10/24/21 0820 03/20/22 0800  AST '20 13 13  '$ ALT '24 17 14  '$ ALKPHOS 46  --   --   BILITOT 0.9 1.2 0.6  PROT 7.1 6.2 6.6  ALBUMIN 4.2  --   --    No results for input(s): "LIPASE", "AMYLASE" in the last 8760 hours. No results for input(s): "AMMONIA" in the last 8760 hours. CBC: Recent Labs    06/24/21 1757 10/24/21 0820 03/20/22 0800  WBC 9.3 8.2 10.3  NEUTROABS  --  6,093 8,271*  HGB 14.1 13.1 12.2  HCT 43.4 39.7 36.9  MCV 91.8 91.9 90.0  PLT 277 248 306   Lipid Panel: Recent Labs    10/24/21 0820 03/20/22 0800  CHOL 232* 185  HDL 46* 42*  LDLCALC 162* 124*  TRIG 118 90  CHOLHDL 5.0* 4.4   Lab Results  Component Value Date   HGBA1C 5.5 10/24/2021    Procedures since last visit: No results  found.  Assessment/Plan 1. Proximal muscle weakness Possible PMR With her CRP elevated I have made referral to Rheumatology but they cannot see her till late March I will start her on Prednisone 10 mg QD She will let me know if she has any side effects I will see her in 2 weeks to assess her progress  2. Chronic bilateral low back pain without sciatica Prednisone will help with that  3. Chronic left shoulder pain   4. Anxiety On Remeron and Xanax       Labs/tests ordered:  * No order type specified * Next appt:  05/01/2022

## 2022-04-25 ENCOUNTER — Telehealth: Payer: Self-pay

## 2022-04-25 NOTE — Telephone Encounter (Signed)
Patient states she took prednisone. Patient states she's having stiffness and joint pain more this morning. Patient is requesting that you please call her so she can speak to you. Message routed to PCP Virgie Dad, MD .

## 2022-04-25 NOTE — Telephone Encounter (Signed)
Tell her to stop Prednisone if she continues with the stiffness.and Worsening Joint pain. And change her appointment to next wed that is 04/30/22.

## 2022-04-25 NOTE — Telephone Encounter (Signed)
Patient called office back and appointment scheduled.

## 2022-04-25 NOTE — Telephone Encounter (Signed)
Called patient and no answer. Voicemail was left with office call back number.   

## 2022-04-30 ENCOUNTER — Encounter: Payer: Self-pay | Admitting: Internal Medicine

## 2022-04-30 ENCOUNTER — Non-Acute Institutional Stay: Payer: Medicare Other | Admitting: Internal Medicine

## 2022-04-30 VITALS — BP 138/84 | HR 81 | Temp 97.8°F | Resp 16 | Ht 60.0 in | Wt 109.3 lb

## 2022-04-30 DIAGNOSIS — M25512 Pain in left shoulder: Secondary | ICD-10-CM

## 2022-04-30 DIAGNOSIS — G8929 Other chronic pain: Secondary | ICD-10-CM | POA: Diagnosis not present

## 2022-04-30 DIAGNOSIS — F419 Anxiety disorder, unspecified: Secondary | ICD-10-CM | POA: Diagnosis not present

## 2022-04-30 DIAGNOSIS — M545 Low back pain, unspecified: Secondary | ICD-10-CM | POA: Diagnosis not present

## 2022-04-30 DIAGNOSIS — E7849 Other hyperlipidemia: Secondary | ICD-10-CM | POA: Diagnosis not present

## 2022-04-30 DIAGNOSIS — M6281 Muscle weakness (generalized): Secondary | ICD-10-CM | POA: Diagnosis not present

## 2022-04-30 DIAGNOSIS — R7982 Elevated C-reactive protein (CRP): Secondary | ICD-10-CM | POA: Diagnosis not present

## 2022-04-30 NOTE — Progress Notes (Signed)
Location: [facility] (12)  Provider:   Code Status:  Goals of Care:     04/30/2022    1:11 PM  Advanced Directives  Does Patient Have a Medical Advance Directive? Yes  Type of Paramedic of [place];Living will  Does patient want to make changes to medical advance directive? No - Patient declined  Copy of [place] in Chart? Yes - validated most recent copy scanned in chart (See row information)     Chief Complaint  Patient presents with   Acute Visit    Patient states she has been having some joint pain and stiffness one but the prednisone has helped some    Immunizations    Patient is due for a shingles vaccine ncir verified    HPI: Patient is a 89 y.o. female seen today for an acute visit for Follow up of her Prednisone [place] in [place]   Acute issue Pain in proximal Muscles and her Joints  CRP Elevated Possible PMR Has Appointment with Rheumatology Started on 10 mg of prednisone  Today she says her symptoms are much better Pain much Improved "I am 90% Better" Has had some Night  sweats  But no other issues    Other issues Depression  Remeron changed to 30 mg Still stays depressed Talks to the therapist online every week Daughter in town son in [place] weight loss recently     Also H/o  Nausea and abdominal pain EGD in 11/22 Mild inflammation characterized by erythema, friability and granularity was found in the gastric antrum. Manometry in 09/20 Normal in EG junction and no Motility issue CT scan of abdomen in 05/22 and 5/23 negative [place] n05/23 negative   Better since she has been on Ativan   Continues to work with therapy for urinary and bowel incontinence Had detailed work-up by GYN and everything is negative     Past Medical History:  Diagnosis Date   Anxiety    Arthritis    "qwhere; but it doesn't hurt" (05/12/2012)   Aspiration pneumonia  ([place]) 03/21/2016   Cataract    left   Compression fracture of first lumbar vertebra ([place]) 07/2016   superior endplate   Depression    Dysphagia    "have had it for 7 yr; got worse 4 days ago" (05/12/2012)   Dysphagia, pharyngoesophageal phase - suspected 05/10/2012   GERD (gastroesophageal reflux disease)    Hypertension    Insomnia    Lumbago with sciatica, right side    Migraines    "menopause cured them" (05/12/2012)   Osteoporosis    Stage 3 chronic kidney disease ([place])    Uterine prolapse     Past Surgical History:  Procedure Laterality Date   CATARACT EXTRACTION W/ INTRAOCULAR LENS IMPLANT Left ? 2010   ESOPHAGEAL MANOMETRY N/A 11/08/2018   Procedure: ESOPHAGEAL MANOMETRY (EM);  Surgeon: Gatha Mayer, MD;  Location: [facility];  Service: Endoscopy;  Laterality: N/A;   ESOPHAGOGASTRODUODENOSCOPY N/A 05/10/2012   Procedure: ESOPHAGOGASTRODUODENOSCOPY (EGD);  Surgeon: Jerene Bears, MD;  Location: [facility];  Service: Gastroenterology;  Laterality: N/A;   TONSILLECTOMY  1940's    Allergies  Allergen Reactions   Lactose Intolerance (Gi) Other (See Comments)    Gi tract issues   Lactulose Other (See Comments)    Gi tract issues   Lactose     Other Reaction(s): GI   Sulfa Antibiotics Hives  Childhood allergy     Outpatient Encounter Medications as of 04/30/2022  Medication Sig   amLODipine (NORVASC) 10 MG tablet TAKE ONE TABLET BY MOUTH DAILY   atenolol (TENORMIN) 50 MG tablet TAKE ONE TABLET BY MOUTH DAILY   Cholecalciferol (VITAMIN D3) 25 MCG (1000 UT) CAPS Take by mouth daily.   conjugated estrogens (PREMARIN) vaginal cream Place 1 Applicatorful vaginally daily.   famotidine (PEPCID) 40 MG tablet Take 1 tablet (40 mg total) by mouth 2 (two) times daily.   LORazepam (ATIVAN) 0.5 MG tablet TAKE ONE TABLET BY MOUTH TWICE DAILY AS NEEDED FOR ANXIETY   mirtazapine (REMERON) 30 MG tablet Take 1 tablet (30 mg total) by mouth at bedtime.   omeprazole (PRILOSEC) 40 MG  capsule Take 40 mg by mouth daily.   predniSONE (DELTASONE) 10 MG tablet Take 1 tablet (10 mg total) by mouth daily with breakfast.   Psyllium (METAMUCIL 4 IN 1 FIBER) 25 % PACK 1 packet with 8 ounces of liquid as needed Orally Once a day for 30 day(s)   sertraline (ZOLOFT) 20 MG/ML concentrated solution TAKE ONE TEASPOONFUL (5ML) BY MOUTH ONCE DAILY   traZODone (DESYREL) 50 MG tablet TAKE ONE TABLET BY MOUTH AT BEDTIME   vitamin B-12 (CYANOCOBALAMIN) 1000 MCG tablet Take 1 tablet (1,000 mcg total) by mouth daily.   No facility-administered encounter medications on file as of 04/30/2022.    Review of Systems:  Review of Systems  Constitutional:  Negative for activity change and appetite change.  HENT: Negative.    Respiratory:  Negative for cough and shortness of breath.   Cardiovascular:  Negative for leg swelling.  Gastrointestinal:  Negative for constipation.  Genitourinary: Negative.   Musculoskeletal:  Positive for arthralgias and myalgias. Negative for gait problem.  Skin: Negative.   Neurological:  Negative for dizziness and weakness.  Psychiatric/Behavioral:  Positive for dysphoric mood and sleep disturbance. Negative for confusion. The patient is nervous/anxious.     Health Maintenance  Topic Date Due   Zoster Vaccines- Shingrix (1 of 2) Never done   COVID-19 Vaccine (8 - 2023-24 season) 05/08/2022 (Originally 02/16/2022)   Medicare Annual Wellness (AWV)  01/10/2023   DTaP/Tdap/Td (2 - Td or Tdap) 12/19/2027   Pneumonia Vaccine 40+ Years old  Completed   INFLUENZA VACCINE  Completed   DEXA SCAN  Completed   HPV VACCINES  Aged Out    Physical Exam: Vitals:   04/30/22 1308  BP: 138/84  Pulse: 81  Resp: 16  Temp: 97.8 F (36.6 C)  TempSrc: Temporal  SpO2: 97%  Weight: 109 lb 4.8 oz (49.6 kg)  Height: 5' (1.524 m)   Body mass index is 21.35 kg/m. Physical Exam Vitals reviewed.  Constitutional:      Appearance: Normal appearance.  HENT:     Head:  Normocephalic.     Nose: Nose normal.     Mouth/Throat:     Mouth: Mucous membranes are moist.     Pharynx: Oropharynx is clear.  Eyes:     Pupils: Pupils are equal, round, and reactive to light.  Cardiovascular:     Rate and Rhythm: Normal rate and regular rhythm.     Pulses: Normal pulses.     Heart sounds: Normal heart sounds. No murmur heard. Pulmonary:     Effort: Pulmonary effort is normal.     Breath sounds: Normal breath sounds.  Abdominal:     General: Abdomen is flat. Bowel sounds are normal.     Palpations: Abdomen is  soft.  Musculoskeletal:        General: No swelling.     Cervical back: Neck supple.  Skin:    General: Skin is warm.  Neurological:     General: No focal deficit present.     Mental Status: She is alert and oriented to person, place, and time.  Psychiatric:        Mood and Affect: Mood normal.        Thought Content: Thought content normal.     Labs reviewed: Basic Metabolic Panel: Recent Labs    06/24/21 1757 07/03/21 1629 10/24/21 0820 03/20/22 0800  NA 141  --  142 143  K 3.5  --  3.8 3.6  CL 106  --  104 107  CO2 26  --  31 25  GLUCOSE 105*  --  92 103*  BUN 16  --  22 25  CREATININE 1.00  --  0.95 0.88  CALCIUM 9.6  --  9.5 9.5  TSH  --  2.050  --  1.60   Liver Function Tests: Recent Labs    06/24/21 1757 10/24/21 0820 03/20/22 0800  AST '20 13 13  '$ ALT '24 17 14  '$ ALKPHOS 46  --   --   BILITOT 0.9 1.2 0.6  PROT 7.1 6.2 6.6  ALBUMIN 4.2  --   --    No results for input(s): "LIPASE", "AMYLASE" in the last 8760 hours. No results for input(s): "AMMONIA" in the last 8760 hours. CBC: Recent Labs    06/24/21 1757 10/24/21 0820 03/20/22 0800  WBC 9.3 8.2 10.3  NEUTROABS  --  6,093 8,271*  HGB 14.1 13.1 12.2  HCT 43.4 39.7 36.9  MCV 91.8 91.9 90.0  PLT 277 248 306   Lipid Panel: Recent Labs    10/24/21 0820 03/20/22 0800  CHOL 232* 185  HDL 46* 42*  LDLCALC 162* 124*  TRIG 118 90  CHOLHDL 5.0* 4.4   Lab Results   Component Value Date   HGBA1C 5.5 10/24/2021    Procedures since last visit: No results found.  Assessment/Plan 1. Proximal muscle weakness Feels Better with Prednisone Has appointment with Rheumatology to confirm Diagnosis of PMR  2. Elevated C-reactive protein (CRP) On Prednisone Appointment with Rheumatology  Other issues   HLD Diet Controlled Do not want to do Statin due to her Myalgias  Age-related osteoporosis without current pathological fracture DEXA Discussed  Right Femur -3.3 Talked about Fosamax she does not want to do it due to her GERD Prolia Literature given Will discuss with Rheumatology especially if she will be on Steroids  Chronic nausea Resolved since been on Ativan Also on Pepcid insomnia Trazodone and Remeron Labs/tests ordered:  * No order type specified * Next appt:  05/07/2022

## 2022-05-01 ENCOUNTER — Other Ambulatory Visit: Payer: Medicare Other

## 2022-05-07 ENCOUNTER — Encounter: Payer: Medicare Other | Admitting: Internal Medicine

## 2022-05-21 DIAGNOSIS — M353 Polymyalgia rheumatica: Secondary | ICD-10-CM | POA: Diagnosis not present

## 2022-05-21 DIAGNOSIS — Z6822 Body mass index (BMI) 22.0-22.9, adult: Secondary | ICD-10-CM | POA: Diagnosis not present

## 2022-05-21 DIAGNOSIS — Z79899 Other long term (current) drug therapy: Secondary | ICD-10-CM | POA: Diagnosis not present

## 2022-05-21 DIAGNOSIS — M810 Age-related osteoporosis without current pathological fracture: Secondary | ICD-10-CM | POA: Diagnosis not present

## 2022-05-21 DIAGNOSIS — M2560 Stiffness of unspecified joint, not elsewhere classified: Secondary | ICD-10-CM | POA: Diagnosis not present

## 2022-05-23 ENCOUNTER — Encounter: Payer: Self-pay | Admitting: Internal Medicine

## 2022-05-26 ENCOUNTER — Encounter: Payer: Self-pay | Admitting: Internal Medicine

## 2022-06-09 DIAGNOSIS — M810 Age-related osteoporosis without current pathological fracture: Secondary | ICD-10-CM | POA: Diagnosis not present

## 2022-06-10 ENCOUNTER — Other Ambulatory Visit: Payer: Self-pay | Admitting: Orthopedic Surgery

## 2022-06-10 NOTE — Telephone Encounter (Signed)
Patient is requesting a refill of the following medications: Requested Prescriptions   Pending Prescriptions Disp Refills   LORazepam (ATIVAN) 0.5 MG tablet [Pharmacy Med Name: lorazepam 0.5 mg tablet] 60 tablet 5    Sig: TAKE ONE TABLET BY MOUTH TWICE DAILY AS NEEDED FOR ANXIETY    Date of last refill:11/26/2021  Refill amount: 60/5  Treatment agreement date: 10/29/21

## 2022-06-12 NOTE — Telephone Encounter (Signed)
error 

## 2022-06-18 ENCOUNTER — Non-Acute Institutional Stay: Payer: Medicare Other | Admitting: Internal Medicine

## 2022-06-18 ENCOUNTER — Encounter: Payer: Self-pay | Admitting: Internal Medicine

## 2022-06-18 VITALS — BP 130/72 | HR 76 | Temp 97.6°F | Resp 17 | Ht 60.0 in | Wt 113.4 lb

## 2022-06-18 DIAGNOSIS — G8929 Other chronic pain: Secondary | ICD-10-CM | POA: Diagnosis not present

## 2022-06-18 DIAGNOSIS — M545 Low back pain, unspecified: Secondary | ICD-10-CM | POA: Diagnosis not present

## 2022-06-18 DIAGNOSIS — M25512 Pain in left shoulder: Secondary | ICD-10-CM | POA: Diagnosis not present

## 2022-06-18 DIAGNOSIS — M353 Polymyalgia rheumatica: Secondary | ICD-10-CM | POA: Diagnosis not present

## 2022-06-18 DIAGNOSIS — F419 Anxiety disorder, unspecified: Secondary | ICD-10-CM | POA: Diagnosis not present

## 2022-06-18 DIAGNOSIS — M810 Age-related osteoporosis without current pathological fracture: Secondary | ICD-10-CM

## 2022-06-18 DIAGNOSIS — E7849 Other hyperlipidemia: Secondary | ICD-10-CM | POA: Diagnosis not present

## 2022-06-18 DIAGNOSIS — I10 Essential (primary) hypertension: Secondary | ICD-10-CM

## 2022-06-22 NOTE — Progress Notes (Signed)
Location:  [facility] Service:  Clinic (12)  Provider:   Code Status:  Goals of Care:     06/18/2022    2:32 PM  Advanced Directives  Does Patient Have a Medical Advance Directive? Yes  Type of Estate agent of [place];Living will  Does patient want to make changes to medical advance directive? No - Patient declined  Copy of Healthcare Power of Attorney in Chart? Yes - validated most recent copy scanned in chart (See row information)     Chief Complaint  Patient presents with   Medical Management of Chronic Issues    3 month follow up and discuss physical therapy for her back    HPI: Patient is a 89 y.o. female seen today for medical management of chronic diseases.    [place] in [facility]   Acute issue Patient is recently diagnosed with PMR.  Now on prednisone.  Per rheumatology tell to decrease the dose to 7.5. Also going to get Reclast for her osteoporosis by rheumatology Does complain of some dry mouth but does not want to change trazodone at this time Appetite  improved Wt Readings from Last 3 Encounters:  06/18/22 113 lb 6.4 oz (51.4 kg)  04/30/22 109 lb 4.8 oz (49.6 kg)  04/23/22 109 lb 12.8 oz (49.8 kg)  Weight has increased Continues to struggle with depression and fatigue Also has some shoulder pain wants to restart her OT   Also H/o  Nausea and abdominal pain EGD in 11/22 Mild inflammation characterized by erythema, friability and granularity was found in the gastric antrum. Manometry in 09/20 Normal in EG junction and no Motility issue CT scan of abdomen in 05/22 and 5/23 negative [place] n05/23 negative    Past Medical History:  Diagnosis Date   Anxiety    Arthritis    "qwhere; but it doesn't hurt" (05/12/2012)   Aspiration pneumonia (HCC) 03/21/2016   Cataract    left   Compression fracture of first lumbar vertebra (HCC) 07/2016   superior endplate   Depression    Dysphagia    "have had it for  7 yr; got worse 4 days ago" (05/12/2012)   Dysphagia, pharyngoesophageal phase - suspected 05/10/2012   GERD (gastroesophageal reflux disease)    Hypertension    Insomnia    Lumbago with sciatica, right side    Migraines    "menopause cured them" (05/12/2012)   Osteoporosis    Stage 3 chronic kidney disease (HCC)    Uterine prolapse     Past Surgical History:  Procedure Laterality Date   CATARACT EXTRACTION W/ INTRAOCULAR LENS IMPLANT Left ? 2010   ESOPHAGEAL MANOMETRY N/A 11/08/2018   Procedure: ESOPHAGEAL MANOMETRY (EM);  Surgeon: Iva Boop, MD;  Location: [facility];  Service: Endoscopy;  Laterality: N/A;   ESOPHAGOGASTRODUODENOSCOPY N/A 05/10/2012   Procedure: ESOPHAGOGASTRODUODENOSCOPY (EGD);  Surgeon: Beverley Fiedler, MD;  Location: [facility];  Service: Gastroenterology;  Laterality: N/A;   TONSILLECTOMY  1940's    Allergies  Allergen Reactions   Lactose Intolerance (Gi) Other (See Comments)    Gi tract issues   Lactulose Other (See Comments)    Gi tract issues   Lactose     Other Reaction(s): GI   Sulfa Antibiotics Hives    Childhood allergy     Outpatient Encounter Medications as of 06/18/2022  Medication Sig   amLODipine (NORVASC) 10 MG tablet TAKE ONE TABLET BY MOUTH DAILY   atenolol (  TENORMIN) 50 MG tablet TAKE ONE TABLET BY MOUTH DAILY   Cholecalciferol (VITAMIN D3) 25 MCG (1000 UT) CAPS Take by mouth daily.   conjugated estrogens (PREMARIN) vaginal cream Place 1 Applicatorful vaginally daily.   famotidine (PEPCID) 40 MG tablet Take 1 tablet (40 mg total) by mouth 2 (two) times daily.   LORazepam (ATIVAN) 0.5 MG tablet TAKE ONE TABLET BY MOUTH TWICE DAILY AS NEEDED FOR ANXIETY   mirtazapine (REMERON) 30 MG tablet Take 1 tablet (30 mg total) by mouth at bedtime.   omeprazole (PRILOSEC) 40 MG capsule Take 40 mg by mouth daily.   predniSONE (DELTASONE) 10 MG tablet Take 1 tablet (10 mg total) by mouth daily with breakfast. (Patient taking differently: Take 5  mg by mouth daily with breakfast. Take 7.5mg  daily)   Psyllium (METAMUCIL 4 IN 1 FIBER) 25 % PACK 1 packet with 8 ounces of liquid as needed Orally Once a day for 30 day(s)   sertraline (ZOLOFT) 20 MG/ML concentrated solution TAKE ONE TEASPOONFUL ( ) BY MOUTH ONCE DAILY   traZODone (DESYREL) 50 MG tablet TAKE ONE TABLET BY MOUTH AT BEDTIME   vitamin B-12 (CYANOCOBALAMIN) 1000 MCG tablet Take 1 tablet (1,000 mcg total) by mouth daily.   No facility-administered encounter medications on file as of 06/18/2022.    Review of Systems:  Review of Systems  Constitutional:  Negative for activity change and appetite change.  HENT: Negative.    Respiratory:  Negative for cough and shortness of breath.   Cardiovascular:  Negative for leg swelling.  Gastrointestinal:  Negative for constipation.  Genitourinary: Negative.   Musculoskeletal:  Positive for arthralgias. Negative for gait problem and myalgias.  Skin: Negative.   Neurological:  Negative for dizziness and weakness.  Psychiatric/Behavioral:  Positive for dysphoric mood and sleep disturbance. Negative for confusion. The patient is nervous/anxious.     Health Maintenance  Topic Date Due   COVID-19 Vaccine (8 - 2023-24 season) 07/04/2022 (Originally 02/16/2022)   Zoster Vaccines- Shingrix (2 of 2) 08/04/2022   INFLUENZA VACCINE  09/25/2022   Medicare Annual Wellness (AWV)  01/10/2023   DTaP/Tdap/Td (2 - Td or Tdap) 12/19/2027   Pneumonia Vaccine 68+ Years old  Completed   DEXA SCAN  Completed   HPV VACCINES  Aged Out    Physical Exam: Vitals:   06/18/22 1427  BP: 130/72  Pulse: 76  Resp: 17  Temp: 97.6 F (36.4 C)  TempSrc: Temporal  SpO2: 94%  Weight: 113 lb 6.4 oz (51.4 kg)  Height: 5' (1.524 m)   Body mass index is 22.15 kg/m. Physical Exam Vitals reviewed.  Constitutional:      Appearance: Normal appearance.  HENT:     Head: Normocephalic.     Nose: Nose normal.     Mouth/Throat:     Mouth: Mucous membranes are  moist.     Pharynx: Oropharynx is clear.  Eyes:     Pupils: Pupils are equal, round, and reactive to light.  Cardiovascular:     Rate and Rhythm: Normal rate and regular rhythm.     Pulses: Normal pulses.     Heart sounds: Normal heart sounds. No murmur heard. Pulmonary:     Effort: Pulmonary effort is normal.     Breath sounds: Normal breath sounds.  Abdominal:     General: Abdomen is flat. Bowel sounds are normal.     Palpations: Abdomen is soft.  Musculoskeletal:        General: No swelling.     Cervical  back: Neck supple.  Skin:    General: Skin is warm.  Neurological:     General: No focal deficit present.     Mental Status: She is alert and oriented to person, place, and time.  Psychiatric:        Mood and Affect: Mood normal.        Thought Content: Thought content normal.     Labs reviewed: Basic Metabolic Panel: Recent Labs    06/24/21 1757 07/03/21 1629 10/24/21 0820 03/20/22 0800  NA 141  --  142 143  K 3.5  --  3.8 3.6  CL 106  --  104 107  CO2 26  --  31 25  GLUCOSE 105*  --  92 103*  BUN 16  --  22 25  CREATININE 1.00  --  0.95 0.88  CALCIUM 9.6  --  9.5 9.5  TSH  --  2.050  --  1.60   Liver Function Tests: Recent Labs    06/24/21 1757 10/24/21 0820 03/20/22 0800  AST 20 13 13   ALT 24 17 14   ALKPHOS 46  --   --   BILITOT 0.9 1.2 0.6  PROT 7.1 6.2 6.6  ALBUMIN 4.2  --   --    No results for input(s): "LIPASE", "AMYLASE" in the last 8760 hours. No results for input(s): "AMMONIA" in the last 8760 hours. CBC: Recent Labs    06/24/21 1757 10/24/21 0820 03/20/22 0800  WBC 9.3 8.2 10.3  NEUTROABS  --  6,093 8,271*  HGB 14.1 13.1 12.2  HCT 43.4 39.7 36.9  MCV 91.8 91.9 90.0  PLT 277 248 306   Lipid Panel: Recent Labs    10/24/21 0820 03/20/22 0800  CHOL 232* 185  HDL 46* 42*  LDLCALC 162* 161*  TRIG 118 90  CHOLHDL 5.0* 4.4   Lab Results  Component Value Date   HGBA1C 5.5 10/24/2021    Procedures since last visit: No  results found.  Assessment/Plan 1. PMR (polymyalgia rheumatica) (HCC) Rheumatology On 7.5 mg of Prednisone  2. Age-related osteoporosis without current pathological fracture Reclast per rheumatology  3. Chronic bilateral low back pain without sciatica Tylenol  4. Chronic left shoulder pain OT referal  5. Anxiety Ativan, Remeron and Zoloft  6. Other hyperlipidemia Consider Statin  7. Primary hypertension Norvasc and Tenormin    Labs/tests ordered:  * No order type specified * Next appt:  09/17/2022

## 2022-06-26 DIAGNOSIS — M353 Polymyalgia rheumatica: Secondary | ICD-10-CM | POA: Diagnosis not present

## 2022-06-26 DIAGNOSIS — M6281 Muscle weakness (generalized): Secondary | ICD-10-CM | POA: Diagnosis not present

## 2022-06-30 DIAGNOSIS — M353 Polymyalgia rheumatica: Secondary | ICD-10-CM | POA: Diagnosis not present

## 2022-06-30 DIAGNOSIS — M6281 Muscle weakness (generalized): Secondary | ICD-10-CM | POA: Diagnosis not present

## 2022-07-02 DIAGNOSIS — M353 Polymyalgia rheumatica: Secondary | ICD-10-CM | POA: Diagnosis not present

## 2022-07-02 DIAGNOSIS — M6281 Muscle weakness (generalized): Secondary | ICD-10-CM | POA: Diagnosis not present

## 2022-07-07 DIAGNOSIS — M353 Polymyalgia rheumatica: Secondary | ICD-10-CM | POA: Diagnosis not present

## 2022-07-07 DIAGNOSIS — M6281 Muscle weakness (generalized): Secondary | ICD-10-CM | POA: Diagnosis not present

## 2022-07-08 ENCOUNTER — Encounter: Payer: Self-pay | Admitting: Internal Medicine

## 2022-07-09 DIAGNOSIS — M6281 Muscle weakness (generalized): Secondary | ICD-10-CM | POA: Diagnosis not present

## 2022-07-09 DIAGNOSIS — M353 Polymyalgia rheumatica: Secondary | ICD-10-CM | POA: Diagnosis not present

## 2022-07-11 DIAGNOSIS — M6281 Muscle weakness (generalized): Secondary | ICD-10-CM | POA: Diagnosis not present

## 2022-07-11 DIAGNOSIS — M353 Polymyalgia rheumatica: Secondary | ICD-10-CM | POA: Diagnosis not present

## 2022-07-14 DIAGNOSIS — M6281 Muscle weakness (generalized): Secondary | ICD-10-CM | POA: Diagnosis not present

## 2022-07-14 DIAGNOSIS — M353 Polymyalgia rheumatica: Secondary | ICD-10-CM | POA: Diagnosis not present

## 2022-07-15 DIAGNOSIS — L820 Inflamed seborrheic keratosis: Secondary | ICD-10-CM | POA: Diagnosis not present

## 2022-07-15 DIAGNOSIS — D1801 Hemangioma of skin and subcutaneous tissue: Secondary | ICD-10-CM | POA: Diagnosis not present

## 2022-07-15 DIAGNOSIS — L814 Other melanin hyperpigmentation: Secondary | ICD-10-CM | POA: Diagnosis not present

## 2022-07-15 DIAGNOSIS — L570 Actinic keratosis: Secondary | ICD-10-CM | POA: Diagnosis not present

## 2022-07-15 DIAGNOSIS — L821 Other seborrheic keratosis: Secondary | ICD-10-CM | POA: Diagnosis not present

## 2022-07-16 DIAGNOSIS — M6281 Muscle weakness (generalized): Secondary | ICD-10-CM | POA: Diagnosis not present

## 2022-07-16 DIAGNOSIS — M353 Polymyalgia rheumatica: Secondary | ICD-10-CM | POA: Diagnosis not present

## 2022-07-18 DIAGNOSIS — M6281 Muscle weakness (generalized): Secondary | ICD-10-CM | POA: Diagnosis not present

## 2022-07-18 DIAGNOSIS — M353 Polymyalgia rheumatica: Secondary | ICD-10-CM | POA: Diagnosis not present

## 2022-07-19 DIAGNOSIS — Z23 Encounter for immunization: Secondary | ICD-10-CM | POA: Diagnosis not present

## 2022-07-21 DIAGNOSIS — M353 Polymyalgia rheumatica: Secondary | ICD-10-CM | POA: Diagnosis not present

## 2022-07-21 DIAGNOSIS — M6281 Muscle weakness (generalized): Secondary | ICD-10-CM | POA: Diagnosis not present

## 2022-07-23 ENCOUNTER — Other Ambulatory Visit: Payer: Self-pay | Admitting: Internal Medicine

## 2022-07-23 DIAGNOSIS — M353 Polymyalgia rheumatica: Secondary | ICD-10-CM | POA: Diagnosis not present

## 2022-07-23 DIAGNOSIS — M6281 Muscle weakness (generalized): Secondary | ICD-10-CM | POA: Diagnosis not present

## 2022-07-23 MED ORDER — MIRTAZAPINE 30 MG PO TABS: 30.0000 mg | ORAL_TABLET | Freq: Every day | ORAL | 3 refills | Status: DC

## 2022-07-23 NOTE — Telephone Encounter (Signed)
Pharmacy requested refill.  °Pended Rx and sent to Dr. Gupta for approval due to HIGH ALERT Warning.  °

## 2022-07-24 DIAGNOSIS — M810 Age-related osteoporosis without current pathological fracture: Secondary | ICD-10-CM | POA: Diagnosis not present

## 2022-07-24 DIAGNOSIS — M2560 Stiffness of unspecified joint, not elsewhere classified: Secondary | ICD-10-CM | POA: Diagnosis not present

## 2022-07-24 DIAGNOSIS — Z6822 Body mass index (BMI) 22.0-22.9, adult: Secondary | ICD-10-CM | POA: Diagnosis not present

## 2022-07-24 DIAGNOSIS — R5383 Other fatigue: Secondary | ICD-10-CM | POA: Diagnosis not present

## 2022-07-24 DIAGNOSIS — M353 Polymyalgia rheumatica: Secondary | ICD-10-CM | POA: Diagnosis not present

## 2022-07-24 DIAGNOSIS — Z79899 Other long term (current) drug therapy: Secondary | ICD-10-CM | POA: Diagnosis not present

## 2022-07-25 DIAGNOSIS — M353 Polymyalgia rheumatica: Secondary | ICD-10-CM | POA: Diagnosis not present

## 2022-07-25 DIAGNOSIS — M6281 Muscle weakness (generalized): Secondary | ICD-10-CM | POA: Diagnosis not present

## 2022-07-28 DIAGNOSIS — M353 Polymyalgia rheumatica: Secondary | ICD-10-CM | POA: Diagnosis not present

## 2022-07-28 DIAGNOSIS — M6281 Muscle weakness (generalized): Secondary | ICD-10-CM | POA: Diagnosis not present

## 2022-07-30 DIAGNOSIS — M353 Polymyalgia rheumatica: Secondary | ICD-10-CM | POA: Diagnosis not present

## 2022-07-30 DIAGNOSIS — M6281 Muscle weakness (generalized): Secondary | ICD-10-CM | POA: Diagnosis not present

## 2022-08-04 DIAGNOSIS — M353 Polymyalgia rheumatica: Secondary | ICD-10-CM | POA: Diagnosis not present

## 2022-08-04 DIAGNOSIS — M6281 Muscle weakness (generalized): Secondary | ICD-10-CM | POA: Diagnosis not present

## 2022-08-06 DIAGNOSIS — M353 Polymyalgia rheumatica: Secondary | ICD-10-CM | POA: Diagnosis not present

## 2022-08-06 DIAGNOSIS — M6281 Muscle weakness (generalized): Secondary | ICD-10-CM | POA: Diagnosis not present

## 2022-08-11 DIAGNOSIS — M6281 Muscle weakness (generalized): Secondary | ICD-10-CM | POA: Diagnosis not present

## 2022-08-11 DIAGNOSIS — M353 Polymyalgia rheumatica: Secondary | ICD-10-CM | POA: Diagnosis not present

## 2022-08-13 DIAGNOSIS — M353 Polymyalgia rheumatica: Secondary | ICD-10-CM | POA: Diagnosis not present

## 2022-08-13 DIAGNOSIS — M6281 Muscle weakness (generalized): Secondary | ICD-10-CM | POA: Diagnosis not present

## 2022-08-15 ENCOUNTER — Other Ambulatory Visit: Payer: Self-pay | Admitting: Internal Medicine

## 2022-08-18 ENCOUNTER — Other Ambulatory Visit: Payer: Self-pay | Admitting: Internal Medicine

## 2022-08-18 DIAGNOSIS — M6281 Muscle weakness (generalized): Secondary | ICD-10-CM | POA: Diagnosis not present

## 2022-08-18 DIAGNOSIS — M353 Polymyalgia rheumatica: Secondary | ICD-10-CM | POA: Diagnosis not present

## 2022-08-20 DIAGNOSIS — M6281 Muscle weakness (generalized): Secondary | ICD-10-CM | POA: Diagnosis not present

## 2022-08-20 DIAGNOSIS — M353 Polymyalgia rheumatica: Secondary | ICD-10-CM | POA: Diagnosis not present

## 2022-08-22 DIAGNOSIS — M6281 Muscle weakness (generalized): Secondary | ICD-10-CM | POA: Diagnosis not present

## 2022-08-22 DIAGNOSIS — M353 Polymyalgia rheumatica: Secondary | ICD-10-CM | POA: Diagnosis not present

## 2022-08-25 DIAGNOSIS — M6281 Muscle weakness (generalized): Secondary | ICD-10-CM | POA: Diagnosis not present

## 2022-08-25 DIAGNOSIS — M353 Polymyalgia rheumatica: Secondary | ICD-10-CM | POA: Diagnosis not present

## 2022-09-01 DIAGNOSIS — M6281 Muscle weakness (generalized): Secondary | ICD-10-CM | POA: Diagnosis not present

## 2022-09-01 DIAGNOSIS — M353 Polymyalgia rheumatica: Secondary | ICD-10-CM | POA: Diagnosis not present

## 2022-09-03 ENCOUNTER — Other Ambulatory Visit: Payer: Self-pay | Admitting: Internal Medicine

## 2022-09-03 DIAGNOSIS — M353 Polymyalgia rheumatica: Secondary | ICD-10-CM | POA: Diagnosis not present

## 2022-09-03 DIAGNOSIS — M6281 Muscle weakness (generalized): Secondary | ICD-10-CM | POA: Diagnosis not present

## 2022-09-09 DIAGNOSIS — Z79899 Other long term (current) drug therapy: Secondary | ICD-10-CM | POA: Diagnosis not present

## 2022-09-09 DIAGNOSIS — M810 Age-related osteoporosis without current pathological fracture: Secondary | ICD-10-CM | POA: Diagnosis not present

## 2022-09-09 DIAGNOSIS — Z6822 Body mass index (BMI) 22.0-22.9, adult: Secondary | ICD-10-CM | POA: Diagnosis not present

## 2022-09-09 DIAGNOSIS — R5383 Other fatigue: Secondary | ICD-10-CM | POA: Diagnosis not present

## 2022-09-09 DIAGNOSIS — M2560 Stiffness of unspecified joint, not elsewhere classified: Secondary | ICD-10-CM | POA: Diagnosis not present

## 2022-09-09 DIAGNOSIS — M353 Polymyalgia rheumatica: Secondary | ICD-10-CM | POA: Diagnosis not present

## 2022-09-10 ENCOUNTER — Non-Acute Institutional Stay: Payer: Medicare Other | Admitting: Internal Medicine

## 2022-09-10 ENCOUNTER — Encounter: Payer: Self-pay | Admitting: Internal Medicine

## 2022-09-10 VITALS — BP 112/72 | HR 69 | Temp 97.7°F | Resp 16 | Ht 60.0 in | Wt 116.6 lb

## 2022-09-10 DIAGNOSIS — G8929 Other chronic pain: Secondary | ICD-10-CM

## 2022-09-10 DIAGNOSIS — F419 Anxiety disorder, unspecified: Secondary | ICD-10-CM

## 2022-09-10 DIAGNOSIS — M353 Polymyalgia rheumatica: Secondary | ICD-10-CM

## 2022-09-10 DIAGNOSIS — M545 Low back pain, unspecified: Secondary | ICD-10-CM | POA: Diagnosis not present

## 2022-09-10 DIAGNOSIS — M810 Age-related osteoporosis without current pathological fracture: Secondary | ICD-10-CM

## 2022-09-10 DIAGNOSIS — M6281 Muscle weakness (generalized): Secondary | ICD-10-CM | POA: Diagnosis not present

## 2022-09-10 DIAGNOSIS — E7849 Other hyperlipidemia: Secondary | ICD-10-CM

## 2022-09-10 DIAGNOSIS — M25512 Pain in left shoulder: Secondary | ICD-10-CM | POA: Diagnosis not present

## 2022-09-10 DIAGNOSIS — I10 Essential (primary) hypertension: Secondary | ICD-10-CM

## 2022-09-10 NOTE — Progress Notes (Signed)
[facility]  Clinic (12)  Provider:   Code Status:  Goals of Care:     09/10/2022   10:44 AM  Advanced Directives  Does Patient Have a Medical Advance Directive? Yes  Type of Advance Directive Living will;Healthcare Power of [place];Out of facility DNR (pink MOST or yellow form)  Does patient want to make changes to medical advance directive? No - Patient declined  Copy of Healthcare Power of Attorney in Chart? No - copy requested     Chief Complaint  Patient presents with   Medical Management of Chronic Issues    3 month follow up    HPI: Patient is a 89 y.o. female seen today for an acute visit for Follow up  [place] in [facility] Recent diagnosis of PMR Follows with Rheumatology Did not tolerate low dose of Prednisone Now back on 5 mg which she is doing well She is doing well Doe shave some fatigue and thinks it is due to her meds  Osteoporosis Tscore -3 On Reclast  Depression and Fatigue Wants to try tapering Trazodone  Lest shoulder pain Working with therapy Also H/o  Nausea and abdominal pain EGD in 11/22 Mild inflammation characterized by erythema, friability and granularity was found in the gastric antrum. Manometry in 09/20 Normal in EG junction and no Motility issue CT scan of abdomen in 05/22 and 5/23 negative [place] 05/23 negative   Daughter in [place] in [place]  Past Medical History:  Diagnosis Date   Anxiety    Arthritis    "qwhere; but it doesn't hurt" (05/12/2012)   Aspiration pneumonia (HCC) 03/21/2016   Cataract    left   Compression fracture of first lumbar vertebra (HCC) 07/2016   superior endplate   Depression    Dysphagia    "have had it for 7 yr; got worse 4 days ago" (05/12/2012)   Dysphagia, pharyngoesophageal phase - suspected 05/10/2012   GERD (gastroesophageal reflux disease)    Hypertension    Insomnia    Lumbago with sciatica, right side    Migraines    "menopause cured them"  (05/12/2012)   Osteoporosis    Stage 3 chronic kidney disease (HCC)    Uterine prolapse     Past Surgical History:  Procedure Laterality Date   CATARACT EXTRACTION W/ INTRAOCULAR LENS IMPLANT Left ? 2010   ESOPHAGEAL MANOMETRY N/A 11/08/2018   Procedure: ESOPHAGEAL MANOMETRY (EM);  Surgeon: Iva Boop, MD;  Location: [facility];  Service: Endoscopy;  Laterality: N/A;   ESOPHAGOGASTRODUODENOSCOPY N/A 05/10/2012   Procedure: ESOPHAGOGASTRODUODENOSCOPY (EGD);  Surgeon: Beverley Fiedler, MD;  Location: [facility];  Service: Gastroenterology;  Laterality: N/A;   TONSILLECTOMY  1940's    Allergies  Allergen Reactions   Lactose Intolerance (Gi) Other (See Comments)    Gi tract issues   Lactulose Other (See Comments)    Gi tract issues   Lactose     Other Reaction(s): GI   Sulfa Antibiotics Hives    Childhood allergy     Outpatient Encounter Medications as of 09/10/2022  Medication Sig   amLODipine (NORVASC) 10 MG tablet TAKE ONE TABLET BY MOUTH DAILY   atenolol (TENORMIN) 50 MG tablet TAKE ONE TABLET BY MOUTH DAILY   Cholecalciferol (VITAMIN D3) 25 MCG (1000 UT) CAPS Take by mouth daily.   famotidine (PEPCID) 40 MG tablet Take 1 tablet (40 mg total) by mouth 2 (two) times daily.   LORazepam (ATIVAN) 0.5 MG tablet  TAKE ONE TABLET BY MOUTH TWICE DAILY AS NEEDED FOR ANXIETY   mirtazapine (REMERON) 30 MG tablet Take 1 tablet (30 mg total) by mouth at bedtime.   predniSONE (DELTASONE) 10 MG tablet Take 1 tablet (10 mg total) by mouth daily with breakfast. (Patient taking differently: Take 5 mg by mouth daily with breakfast. Take 5 mg daily)   Psyllium (METAMUCIL 4 IN 1 FIBER) 25 % PACK 1 packet with 8 ounces of liquid as needed Orally Once a day for 30 day(s)   sertraline (ZOLOFT) 20 MG/ML concentrated solution TAKE ONE TEASPOONFUL ( ) BY MOUTH ONCE DAILY   SHINGRIX injection Inject 0.5 mLs into the muscle once.   traZODone (DESYREL) 50 MG tablet Take 25 mg by mouth at bedtime. Can  take Extra 25 mg PRN   vitamin B-12 (CYANOCOBALAMIN) 1000 MCG tablet Take 1 tablet (1,000 mcg total) by mouth daily.   [DISCONTINUED] traZODone (DESYREL) 50 MG tablet TAKE ONE TABLET BY MOUTH AT BEDTIME (Patient taking differently: Take 25 mg by mouth at bedtime. Can take other half PRN)   [DISCONTINUED] conjugated estrogens (PREMARIN) vaginal cream Place 1 Applicatorful vaginally daily. (Patient not taking: Reported on 09/10/2022)   [DISCONTINUED] omeprazole (PRILOSEC) 40 MG capsule Take 40 mg by mouth daily.   No facility-administered encounter medications on file as of 09/10/2022.    Review of Systems:  Review of Systems  Health Maintenance  Topic Date Due   COVID-19 Vaccine (9 - 2023-24 season) 09/13/2022   Zoster Vaccines- Shingrix (2 of 2) 12/11/2022 (Originally 08/04/2022)   INFLUENZA VACCINE  09/25/2022   Medicare Annual Wellness (AWV)  01/10/2023   DTaP/Tdap/Td (2 - Td or Tdap) 12/19/2027   Pneumonia Vaccine 49+ Years old  Completed   DEXA SCAN  Completed   HPV VACCINES  Aged Out    Physical Exam: Vitals:   09/10/22 1040  BP: 112/72  Pulse: 69  Resp: 16  Temp: 97.7 F (36.5 C)  TempSrc: Temporal  SpO2: 97%  Weight: 116 lb 9.6 oz (52.9 kg)  Height: 5' (1.524 m)   Body mass index is 22.77 kg/m. Physical Exam Vitals reviewed.  Constitutional:      Appearance: Normal appearance.  HENT:     Head: Normocephalic.     Nose: Nose normal.     Mouth/Throat:     Mouth: Mucous membranes are moist.     Pharynx: Oropharynx is clear.  Eyes:     Pupils: Pupils are equal, round, and reactive to light.  Cardiovascular:     Rate and Rhythm: Normal rate and regular rhythm.     Pulses: Normal pulses.     Heart sounds: Normal heart sounds. No murmur heard. Pulmonary:     Effort: Pulmonary effort is normal.     Breath sounds: Normal breath sounds.  Abdominal:     General: Abdomen is flat. Bowel sounds are normal.     Palpations: Abdomen is soft.  Musculoskeletal:         General: No swelling.     Cervical back: Neck supple.  Skin:    General: Skin is warm.  Neurological:     General: No focal deficit present.     Mental Status: She is alert and oriented to person, place, and time.  Psychiatric:        Mood and Affect: Mood normal.        Thought Content: Thought content normal.     Labs reviewed: Basic Metabolic Panel: Recent Labs    10/24/21 0820  03/20/22 0800  NA 142 143  K 3.8 3.6  CL 104 107  CO2 31 25  GLUCOSE 92 103*  BUN 22 25  CREATININE 0.95 0.88  CALCIUM 9.5 9.5  TSH  --  1.60   Liver Function Tests: Recent Labs    10/24/21 0820 03/20/22 0800  AST 13 13  ALT 17 14  BILITOT 1.2 0.6  PROT 6.2 6.6   No results for input(s): "LIPASE", "AMYLASE" in the last 8760 hours. No results for input(s): "AMMONIA" in the last 8760 hours. CBC: Recent Labs    10/24/21 0820 03/20/22 0800  WBC 8.2 10.3  NEUTROABS 6,093 8,271*  HGB 13.1 12.2  HCT 39.7 36.9  MCV 91.9 90.0  PLT 248 306   Lipid Panel: Recent Labs    10/24/21 0820 03/20/22 0800  CHOL 232* 185  HDL 46* 42*  LDLCALC 162* 811*  TRIG 118 90  CHOLHDL 5.0* 4.4   Lab Results  Component Value Date   HGBA1C 5.5 10/24/2021    Procedures since last visit: No results found.  Assessment/Plan 1. PMR (polymyalgia rheumatica) (HCC) On Prednisone 5 mg now Has gained some weight  Otherwise tolerating Follows with Rheumatology  2. Age-related osteoporosis without current pathological fracture On Reclast per Rheumatology  3. Chronic bilateral low back pain without sciatica Tylenol  4. Chronic left shoulder pain Works with OT  5. Anxiety Ativan Failed GDR  6. Other hyperlipidemia Diet control for now  7. Primary hypertension Norvasc and Tenormin 8 Depression On Zoloft and Remeron 9 insomnia On Remeron Will Try reducing the dose of Trazodone to 25 mg to see if it helps her excessive sleepiness  Labs/tests ordered:  * No order type specified * Next  appt:  Visit date not found

## 2022-09-15 DIAGNOSIS — M353 Polymyalgia rheumatica: Secondary | ICD-10-CM | POA: Diagnosis not present

## 2022-09-15 DIAGNOSIS — M6281 Muscle weakness (generalized): Secondary | ICD-10-CM | POA: Diagnosis not present

## 2022-09-17 ENCOUNTER — Encounter: Payer: Medicare Other | Admitting: Internal Medicine

## 2022-09-17 DIAGNOSIS — M353 Polymyalgia rheumatica: Secondary | ICD-10-CM | POA: Diagnosis not present

## 2022-09-17 DIAGNOSIS — M6281 Muscle weakness (generalized): Secondary | ICD-10-CM | POA: Diagnosis not present

## 2022-09-23 DIAGNOSIS — M353 Polymyalgia rheumatica: Secondary | ICD-10-CM | POA: Diagnosis not present

## 2022-09-23 DIAGNOSIS — M6281 Muscle weakness (generalized): Secondary | ICD-10-CM | POA: Diagnosis not present

## 2022-09-24 DIAGNOSIS — M6281 Muscle weakness (generalized): Secondary | ICD-10-CM | POA: Diagnosis not present

## 2022-09-24 DIAGNOSIS — M353 Polymyalgia rheumatica: Secondary | ICD-10-CM | POA: Diagnosis not present

## 2022-09-26 DIAGNOSIS — M6281 Muscle weakness (generalized): Secondary | ICD-10-CM | POA: Diagnosis not present

## 2022-09-26 DIAGNOSIS — M353 Polymyalgia rheumatica: Secondary | ICD-10-CM | POA: Diagnosis not present

## 2022-09-29 DIAGNOSIS — M353 Polymyalgia rheumatica: Secondary | ICD-10-CM | POA: Diagnosis not present

## 2022-09-29 DIAGNOSIS — M6281 Muscle weakness (generalized): Secondary | ICD-10-CM | POA: Diagnosis not present

## 2022-10-02 DIAGNOSIS — M353 Polymyalgia rheumatica: Secondary | ICD-10-CM | POA: Diagnosis not present

## 2022-10-02 DIAGNOSIS — M6281 Muscle weakness (generalized): Secondary | ICD-10-CM | POA: Diagnosis not present

## 2022-10-06 DIAGNOSIS — M353 Polymyalgia rheumatica: Secondary | ICD-10-CM | POA: Diagnosis not present

## 2022-10-06 DIAGNOSIS — M6281 Muscle weakness (generalized): Secondary | ICD-10-CM | POA: Diagnosis not present

## 2022-10-08 DIAGNOSIS — M6281 Muscle weakness (generalized): Secondary | ICD-10-CM | POA: Diagnosis not present

## 2022-10-08 DIAGNOSIS — M353 Polymyalgia rheumatica: Secondary | ICD-10-CM | POA: Diagnosis not present

## 2022-10-10 DIAGNOSIS — M6281 Muscle weakness (generalized): Secondary | ICD-10-CM | POA: Diagnosis not present

## 2022-10-10 DIAGNOSIS — M353 Polymyalgia rheumatica: Secondary | ICD-10-CM | POA: Diagnosis not present

## 2022-10-13 DIAGNOSIS — M6281 Muscle weakness (generalized): Secondary | ICD-10-CM | POA: Diagnosis not present

## 2022-10-13 DIAGNOSIS — M353 Polymyalgia rheumatica: Secondary | ICD-10-CM | POA: Diagnosis not present

## 2022-10-15 DIAGNOSIS — H04123 Dry eye syndrome of bilateral lacrimal glands: Secondary | ICD-10-CM | POA: Diagnosis not present

## 2022-10-15 DIAGNOSIS — M6281 Muscle weakness (generalized): Secondary | ICD-10-CM | POA: Diagnosis not present

## 2022-10-15 DIAGNOSIS — M353 Polymyalgia rheumatica: Secondary | ICD-10-CM | POA: Diagnosis not present

## 2022-10-15 DIAGNOSIS — Z961 Presence of intraocular lens: Secondary | ICD-10-CM | POA: Diagnosis not present

## 2022-10-20 DIAGNOSIS — M6281 Muscle weakness (generalized): Secondary | ICD-10-CM | POA: Diagnosis not present

## 2022-10-20 DIAGNOSIS — M353 Polymyalgia rheumatica: Secondary | ICD-10-CM | POA: Diagnosis not present

## 2022-10-22 DIAGNOSIS — M6281 Muscle weakness (generalized): Secondary | ICD-10-CM | POA: Diagnosis not present

## 2022-10-22 DIAGNOSIS — M353 Polymyalgia rheumatica: Secondary | ICD-10-CM | POA: Diagnosis not present

## 2022-10-24 DIAGNOSIS — M6281 Muscle weakness (generalized): Secondary | ICD-10-CM | POA: Diagnosis not present

## 2022-10-24 DIAGNOSIS — M353 Polymyalgia rheumatica: Secondary | ICD-10-CM | POA: Diagnosis not present

## 2022-10-29 DIAGNOSIS — M6281 Muscle weakness (generalized): Secondary | ICD-10-CM | POA: Diagnosis not present

## 2022-10-29 DIAGNOSIS — M353 Polymyalgia rheumatica: Secondary | ICD-10-CM | POA: Diagnosis not present

## 2022-11-03 DIAGNOSIS — M353 Polymyalgia rheumatica: Secondary | ICD-10-CM | POA: Diagnosis not present

## 2022-11-03 DIAGNOSIS — M6281 Muscle weakness (generalized): Secondary | ICD-10-CM | POA: Diagnosis not present

## 2022-11-05 DIAGNOSIS — M6281 Muscle weakness (generalized): Secondary | ICD-10-CM | POA: Diagnosis not present

## 2022-11-05 DIAGNOSIS — M353 Polymyalgia rheumatica: Secondary | ICD-10-CM | POA: Diagnosis not present

## 2022-11-10 DIAGNOSIS — M6281 Muscle weakness (generalized): Secondary | ICD-10-CM | POA: Diagnosis not present

## 2022-11-10 DIAGNOSIS — M353 Polymyalgia rheumatica: Secondary | ICD-10-CM | POA: Diagnosis not present

## 2022-11-12 DIAGNOSIS — M6281 Muscle weakness (generalized): Secondary | ICD-10-CM | POA: Diagnosis not present

## 2022-11-12 DIAGNOSIS — M353 Polymyalgia rheumatica: Secondary | ICD-10-CM | POA: Diagnosis not present

## 2022-11-13 ENCOUNTER — Other Ambulatory Visit: Payer: Self-pay | Admitting: Internal Medicine

## 2022-11-13 NOTE — Telephone Encounter (Signed)
Pharmacy requested refill.  Pended Rx and sent to Dr. Chales Abrahams for approval due to HIGH ALERT Warning.

## 2022-11-17 DIAGNOSIS — M353 Polymyalgia rheumatica: Secondary | ICD-10-CM | POA: Diagnosis not present

## 2022-11-17 DIAGNOSIS — M6281 Muscle weakness (generalized): Secondary | ICD-10-CM | POA: Diagnosis not present

## 2022-11-23 DIAGNOSIS — Z23 Encounter for immunization: Secondary | ICD-10-CM | POA: Diagnosis not present

## 2022-11-24 DIAGNOSIS — M6281 Muscle weakness (generalized): Secondary | ICD-10-CM | POA: Diagnosis not present

## 2022-11-24 DIAGNOSIS — M353 Polymyalgia rheumatica: Secondary | ICD-10-CM | POA: Diagnosis not present

## 2022-11-26 DIAGNOSIS — M6281 Muscle weakness (generalized): Secondary | ICD-10-CM | POA: Diagnosis not present

## 2022-11-26 DIAGNOSIS — M353 Polymyalgia rheumatica: Secondary | ICD-10-CM | POA: Diagnosis not present

## 2022-12-09 DIAGNOSIS — L820 Inflamed seborrheic keratosis: Secondary | ICD-10-CM | POA: Diagnosis not present

## 2022-12-09 DIAGNOSIS — L821 Other seborrheic keratosis: Secondary | ICD-10-CM | POA: Diagnosis not present

## 2022-12-09 DIAGNOSIS — L570 Actinic keratosis: Secondary | ICD-10-CM | POA: Diagnosis not present

## 2022-12-09 DIAGNOSIS — L720 Epidermal cyst: Secondary | ICD-10-CM | POA: Diagnosis not present

## 2022-12-11 DIAGNOSIS — M2560 Stiffness of unspecified joint, not elsewhere classified: Secondary | ICD-10-CM | POA: Diagnosis not present

## 2022-12-11 DIAGNOSIS — R5383 Other fatigue: Secondary | ICD-10-CM | POA: Diagnosis not present

## 2022-12-11 DIAGNOSIS — M810 Age-related osteoporosis without current pathological fracture: Secondary | ICD-10-CM | POA: Diagnosis not present

## 2022-12-11 DIAGNOSIS — Z6823 Body mass index (BMI) 23.0-23.9, adult: Secondary | ICD-10-CM | POA: Diagnosis not present

## 2022-12-11 DIAGNOSIS — Z79899 Other long term (current) drug therapy: Secondary | ICD-10-CM | POA: Diagnosis not present

## 2022-12-11 DIAGNOSIS — M353 Polymyalgia rheumatica: Secondary | ICD-10-CM | POA: Diagnosis not present

## 2022-12-23 ENCOUNTER — Other Ambulatory Visit: Payer: Self-pay | Admitting: Orthopedic Surgery

## 2022-12-23 NOTE — Telephone Encounter (Signed)
Patient has request refill on medication Lorazepam. Medication pend and sent to PCP [name], MD for approval.

## 2023-01-01 ENCOUNTER — Encounter: Payer: Self-pay | Admitting: Internal Medicine

## 2023-01-08 ENCOUNTER — Other Ambulatory Visit: Payer: Medicare Other

## 2023-01-08 DIAGNOSIS — I10 Essential (primary) hypertension: Secondary | ICD-10-CM | POA: Diagnosis not present

## 2023-01-08 DIAGNOSIS — E7849 Other hyperlipidemia: Secondary | ICD-10-CM | POA: Diagnosis not present

## 2023-01-08 DIAGNOSIS — M353 Polymyalgia rheumatica: Secondary | ICD-10-CM | POA: Diagnosis not present

## 2023-01-08 DIAGNOSIS — M810 Age-related osteoporosis without current pathological fracture: Secondary | ICD-10-CM | POA: Diagnosis not present

## 2023-01-09 LAB — CBC WITH DIFFERENTIAL/PLATELET
Absolute Lymphocytes: 1897 {cells}/uL (ref 850–3900)
Absolute Monocytes: 508 {cells}/uL (ref 200–950)
Basophils Absolute: 31 {cells}/uL (ref 0–200)
Basophils Relative: 0.5 %
Eosinophils Absolute: 211 {cells}/uL (ref 15–500)
Eosinophils Relative: 3.4 %
HCT: 38.8 % (ref 35.0–45.0)
Hemoglobin: 12.7 g/dL (ref 11.7–15.5)
MCH: 31 pg (ref 27.0–33.0)
MCHC: 32.7 g/dL (ref 32.0–36.0)
MCV: 94.6 fL (ref 80.0–100.0)
MPV: 9.4 fL (ref 7.5–12.5)
Monocytes Relative: 8.2 %
Neutro Abs: 3553 {cells}/uL (ref 1500–7800)
Neutrophils Relative %: 57.3 %
Platelets: 216 10*3/uL (ref 140–400)
RBC: 4.1 10*6/uL (ref 3.80–5.10)
RDW: 13 % (ref 11.0–15.0)
Total Lymphocyte: 30.6 %
WBC: 6.2 10*3/uL (ref 3.8–10.8)

## 2023-01-09 LAB — COMPLETE METABOLIC PANEL WITH GFR
AG Ratio: 2.2 (calc) (ref 1.0–2.5)
ALT: 17 U/L (ref 6–29)
AST: 15 U/L (ref 10–35)
Albumin: 4.1 g/dL (ref 3.6–5.1)
Alkaline phosphatase (APISO): 25 U/L — ABNORMAL LOW (ref 37–153)
BUN: 15 mg/dL (ref 7–25)
CO2: 30 mmol/L (ref 20–32)
Calcium: 8.8 mg/dL (ref 8.6–10.4)
Chloride: 105 mmol/L (ref 98–110)
Creat: 0.83 mg/dL (ref 0.60–0.95)
Globulin: 1.9 g/dL (ref 1.9–3.7)
Glucose, Bld: 94 mg/dL (ref 65–99)
Potassium: 3.4 mmol/L — ABNORMAL LOW (ref 3.5–5.3)
Sodium: 142 mmol/L (ref 135–146)
Total Bilirubin: 0.8 mg/dL (ref 0.2–1.2)
Total Protein: 6 g/dL — ABNORMAL LOW (ref 6.1–8.1)
eGFR: 68 mL/min/{1.73_m2} (ref >=60)

## 2023-01-09 LAB — LIPID PANEL
Cholesterol: 241 mg/dL — ABNORMAL HIGH (ref <200)
HDL: 58 mg/dL (ref >=50)
LDL Cholesterol (Calc): 161 mg/dL — ABNORMAL HIGH
Non-HDL Cholesterol (Calc): 183 mg/dL — ABNORMAL HIGH (ref <130)
Total CHOL/HDL Ratio: 4.2 (calc) (ref <5.0)
Triglycerides: 103 mg/dL (ref <150)

## 2023-01-09 LAB — TSH: TSH: 4.02 m[IU]/L (ref 0.40–4.50)

## 2023-01-14 ENCOUNTER — Encounter: Payer: Self-pay | Admitting: Internal Medicine

## 2023-01-14 ENCOUNTER — Non-Acute Institutional Stay: Payer: Medicare Other | Admitting: Internal Medicine

## 2023-01-14 VITALS — BP 128/84 | HR 63 | Temp 98.7°F | Resp 17 | Ht 60.0 in | Wt 116.0 lb

## 2023-01-14 DIAGNOSIS — M25512 Pain in left shoulder: Secondary | ICD-10-CM | POA: Diagnosis not present

## 2023-01-14 DIAGNOSIS — F5101 Primary insomnia: Secondary | ICD-10-CM | POA: Diagnosis not present

## 2023-01-14 DIAGNOSIS — M353 Polymyalgia rheumatica: Secondary | ICD-10-CM

## 2023-01-14 DIAGNOSIS — E876 Hypokalemia: Secondary | ICD-10-CM | POA: Diagnosis not present

## 2023-01-14 DIAGNOSIS — F330 Major depressive disorder, recurrent, mild: Secondary | ICD-10-CM | POA: Diagnosis not present

## 2023-01-14 DIAGNOSIS — G8929 Other chronic pain: Secondary | ICD-10-CM

## 2023-01-14 DIAGNOSIS — E538 Deficiency of other specified B group vitamins: Secondary | ICD-10-CM | POA: Diagnosis not present

## 2023-01-14 DIAGNOSIS — M810 Age-related osteoporosis without current pathological fracture: Secondary | ICD-10-CM

## 2023-01-14 DIAGNOSIS — E7849 Other hyperlipidemia: Secondary | ICD-10-CM

## 2023-01-14 NOTE — Progress Notes (Signed)
[facility]  [facility] (12)  Provider:   Code Status:  Goals of Care:     01/14/2023   10:22 AM  Advanced Directives  Does Patient Have a Medical Advance Directive? Yes  Type of Estate agent of [place];Living will  Does patient want to make changes to medical advance directive? No - Patient declined  Copy of Healthcare Power of Attorney in Chart? No - copy requested     Chief Complaint  Patient presents with   Medical Management of Chronic Issues    4 month follow up labs     HPI: Patient is a 89 y.o. female seen today for medical management of chronic diseases.   [place] [facility]  Recent diagnosis of PMR Follows with Rheumatology Slow taper of Prednisone per them   Osteoporosis Tscore -3 On Reclast   Depression and Fatigue Trying to taper Trazodone   Constipation Doing Metamucil Also H/o  Nausea and abdominal pain EGD in 11/22 Mild inflammation characterized by erythema, friability and granularity was found in the gastric antrum. Manometry in 09/20 Normal in EG junction and no Motility issue CT scan of abdomen in 05/22 and 5/23 negative [place] 05/23 negative     Daughter in [place] in [place] and [place] in [place]    Past Medical History:  Diagnosis Date   Anxiety    Arthritis    "qwhere; but it doesn't hurt" (05/12/2012)   Aspiration pneumonia (HCC) 03/21/2016   Cataract    left   Compression fracture of first lumbar vertebra (HCC) 07/2016   superior endplate   Depression    Dysphagia    "have had it for 7 yr; got worse 4 days ago" (05/12/2012)   Dysphagia, pharyngoesophageal phase - suspected 05/10/2012   GERD (gastroesophageal reflux disease)    Hypertension    Insomnia    Lumbago with sciatica, right side    Migraines    "menopause cured them" (05/12/2012)   Osteoporosis    Stage 3 chronic kidney disease (HCC)    Uterine prolapse     Past Surgical History:  Procedure  Laterality Date   CATARACT EXTRACTION W/ INTRAOCULAR LENS IMPLANT Left ? 2010   ESOPHAGEAL MANOMETRY N/A 11/08/2018   Procedure: ESOPHAGEAL MANOMETRY (EM);  Surgeon: Iva Boop, MD;  Location: [facility];  Service: Endoscopy;  Laterality: N/A;   ESOPHAGOGASTRODUODENOSCOPY N/A 05/10/2012   Procedure: ESOPHAGOGASTRODUODENOSCOPY (EGD);  Surgeon: Beverley Fiedler, MD;  Location: [facility];  Service: Gastroenterology;  Laterality: N/A;   TONSILLECTOMY  1940's    Allergies  Allergen Reactions   Lactose Intolerance (Gi) Other (See Comments)    Gi tract issues   Lactulose Other (See Comments)    Gi tract issues   Lactose     Other Reaction(s): GI   Sulfa Antibiotics Hives    Childhood allergy     Outpatient Encounter Medications as of 01/14/2023  Medication Sig   amLODipine (NORVASC) 10 MG tablet TAKE ONE TABLET BY MOUTH DAILY   atenolol (TENORMIN) 50 MG tablet TAKE ONE TABLET BY MOUTH DAILY   Cholecalciferol (VITAMIN D3) 25 MCG (1000 UT) CAPS Take by mouth daily.   famotidine (PEPCID) 40 MG tablet Take 1 tablet (40 mg total) by mouth 2 (two) times daily.   LORazepam (ATIVAN) 0.5 MG tablet TAKE ONE TABLET BY MOUTH TWICE DAILY AS NEEDED FOR ANXIETY   mirtazapine (REMERON) 30 MG tablet Take 1 tablet (30 mg total)  by mouth at bedtime.   predniSONE (DELTASONE) 10 MG tablet Take 1 tablet (10 mg total) by mouth daily with breakfast. (Patient taking differently: Take 5 mg by mouth daily with breakfast. Take 5 mg daily)   Psyllium (METAMUCIL 4 IN 1 FIBER) 25 % PACK 1 packet with 8 ounces of liquid as needed Orally Once a day for 30 day(s)   sertraline (ZOLOFT) 20 MG/ML concentrated solution TAKE ONE TEASPOONFUL ( ) BY MOUTH ONCE DAILY   SHINGRIX injection Inject 0.5 mLs into the muscle once.   traZODone (DESYREL) 50 MG tablet Take 25 mg by mouth at bedtime. Can take Extra 25 mg PRN   vitamin B-12 (CYANOCOBALAMIN) 1000 MCG tablet Take 1 tablet (1,000 mcg total) by mouth daily.   No  facility-administered encounter medications on file as of 01/14/2023.    Review of Systems:  Review of Systems  Constitutional:  Negative for activity change and appetite change.  HENT: Negative.    Respiratory:  Negative for cough and shortness of breath.   Cardiovascular:  Negative for leg swelling.  Gastrointestinal:  Positive for constipation.  Genitourinary: Negative.   Musculoskeletal:  Negative for arthralgias, gait problem and myalgias.  Skin: Negative.   Neurological:  Negative for dizziness and weakness.  Psychiatric/Behavioral:  Negative for confusion, dysphoric mood and sleep disturbance.     Health Maintenance  Topic Date Due   Medicare Annual Wellness (AWV)  01/10/2023   Zoster Vaccines- Shingrix (2 of 2) 04/16/2023 (Originally 08/04/2022)   COVID-19 Vaccine (10 - 2023-24 season) 01/20/2023   DTaP/Tdap/Td (2 - Td or Tdap) 12/19/2027   Pneumonia Vaccine 87+ Years old  Completed   INFLUENZA VACCINE  Completed   DEXA SCAN  Completed   HPV VACCINES  Aged Out    Physical Exam: Vitals:   01/14/23 1028  BP: 128/84  Pulse: 63  Resp: 17  Temp: 98.7 F (37.1 C)  TempSrc: Temporal  SpO2: 97%  Weight: 116 lb (52.6 kg)  Height: 5' (1.524 m)   Body mass index is 22.65 kg/m. Physical Exam Vitals reviewed.  Constitutional:      Appearance: Normal appearance.  HENT:     Head: Normocephalic.     Nose: Nose normal.     Mouth/Throat:     Mouth: Mucous membranes are moist.     Pharynx: Oropharynx is clear.  Eyes:     Pupils: Pupils are equal, round, and reactive to light.  Cardiovascular:     Rate and Rhythm: Normal rate and regular rhythm.     Pulses: Normal pulses.     Heart sounds: Normal heart sounds. No murmur heard. Pulmonary:     Effort: Pulmonary effort is normal.     Breath sounds: Normal breath sounds.  Abdominal:     General: Abdomen is flat. Bowel sounds are normal.     Palpations: Abdomen is soft.  Musculoskeletal:        General: No swelling.      Cervical back: Neck supple.  Skin:    General: Skin is warm.  Neurological:     General: No focal deficit present.     Mental Status: She is alert and oriented to person, place, and time.  Psychiatric:        Mood and Affect: Mood normal.        Thought Content: Thought content normal.     Labs reviewed: Basic Metabolic Panel: Recent Labs    03/20/22 0800 01/08/23 0806  NA 143 142  K 3.6 3.4*  CL 107 105  CO2 25 30  GLUCOSE 103* 94  BUN 25 15  CREATININE 0.88 0.83  CALCIUM 9.5 8.8  TSH 1.60 4.02   Liver Function Tests: Recent Labs    03/20/22 0800 01/08/23 0806  AST 13 15  ALT 14 17  BILITOT 0.6 0.8  PROT 6.6 6.0*   No results for input(s): "LIPASE", "AMYLASE" in the last 8760 hours. No results for input(s): "AMMONIA" in the last 8760 hours. CBC: Recent Labs    03/20/22 0800 01/08/23 0806  WBC 10.3 6.2  NEUTROABS 8,271* 3,553  HGB 12.2 12.7  HCT 36.9 38.8  MCV 90.0 94.6  PLT 306 216   Lipid Panel: Recent Labs    03/20/22 0800 01/08/23 0806  CHOL 185 241*  HDL 42* 58  LDLCALC 124* 161*  TRIG 90 103  CHOLHDL 4.4 4.2   Lab Results  Component Value Date   HGBA1C 5.5 10/24/2021    Procedures since last visit: No results found.  Assessment/Plan 1. PMR (polymyalgia rheumatica) (HCC) Prednisone per Rheumatology They are trying to Slowly taper her Prednisone  2. Age-related osteoporosis without current pathological fracture Reclast per rheumatology  3. Chronic left shoulder pain Works with OT PRN  4. Anxiety Ativan Previous GDR has failed  5. Other hyperlipidemia Her LDL is elevated again Reluctant to start her on statin due to her chronic Muscle pain She will continue Diet Modification  6. Hypokalemia Add orange juice to your diet Check in few weeks with Magnesium  7. Primary insomnia Wants tapering off Trazodone Changed it to 25 mg PRn  8. Mild episode of recurrent major depressive disorder (HCC) Zoloft and Remeron  9  HTN On Tenormin and Norvasc 10 Gastritis Takes Pepcid  Labs/tests ordered:   Next appt:  03/09/2023

## 2023-01-27 ENCOUNTER — Encounter: Payer: Self-pay | Admitting: Internal Medicine

## 2023-01-27 ENCOUNTER — Other Ambulatory Visit: Payer: Self-pay | Admitting: Internal Medicine

## 2023-01-27 NOTE — Telephone Encounter (Signed)
Pharmacy requested refill Pended Rx and sent to Dr. Gupta for approval.  

## 2023-02-01 ENCOUNTER — Other Ambulatory Visit: Payer: Self-pay | Admitting: Internal Medicine

## 2023-02-02 ENCOUNTER — Other Ambulatory Visit: Payer: Medicare Other

## 2023-02-02 DIAGNOSIS — E876 Hypokalemia: Secondary | ICD-10-CM | POA: Diagnosis not present

## 2023-02-02 DIAGNOSIS — M810 Age-related osteoporosis without current pathological fracture: Secondary | ICD-10-CM | POA: Diagnosis not present

## 2023-02-02 DIAGNOSIS — E538 Deficiency of other specified B group vitamins: Secondary | ICD-10-CM | POA: Diagnosis not present

## 2023-02-02 LAB — BASIC METABOLIC PANEL WITH GFR
BUN: 20 mg/dL (ref 7–25)
CO2: 30 mmol/L (ref 20–32)
Calcium: 9.2 mg/dL (ref 8.6–10.4)
Chloride: 105 mmol/L (ref 98–110)
Creat: 0.87 mg/dL (ref 0.60–0.95)
Glucose, Bld: 85 mg/dL (ref 65–139)
Potassium: 3.7 mmol/L (ref 3.5–5.3)
Sodium: 142 mmol/L (ref 135–146)
eGFR: 64 mL/min/{1.73_m2} (ref >=60)

## 2023-02-02 LAB — VITAMIN D 25 HYDROXY (VIT D DEFICIENCY, FRACTURES): Vit D, 25-Hydroxy: 38 ng/mL (ref 30–100)

## 2023-02-02 LAB — MAGNESIUM: Magnesium: 1.9 mg/dL (ref 1.5–2.5)

## 2023-02-02 LAB — VITAMIN B12: Vitamin B-12: 1042 pg/mL (ref 200–1100)

## 2023-02-07 ENCOUNTER — Other Ambulatory Visit: Payer: Self-pay | Admitting: Internal Medicine

## 2023-02-10 ENCOUNTER — Other Ambulatory Visit: Payer: Self-pay | Admitting: Internal Medicine

## 2023-02-11 ENCOUNTER — Other Ambulatory Visit: Payer: Self-pay | Admitting: Internal Medicine

## 2023-02-11 ENCOUNTER — Encounter: Payer: Self-pay | Admitting: Internal Medicine

## 2023-02-11 ENCOUNTER — Non-Acute Institutional Stay: Payer: Medicare Other | Admitting: Internal Medicine

## 2023-02-11 VITALS — BP 120/68 | HR 67 | Temp 97.8°F | Resp 16 | Ht 60.0 in | Wt 119.7 lb

## 2023-02-11 DIAGNOSIS — M810 Age-related osteoporosis without current pathological fracture: Secondary | ICD-10-CM | POA: Diagnosis not present

## 2023-02-11 DIAGNOSIS — F5101 Primary insomnia: Secondary | ICD-10-CM

## 2023-02-11 DIAGNOSIS — E538 Deficiency of other specified B group vitamins: Secondary | ICD-10-CM | POA: Diagnosis not present

## 2023-02-11 DIAGNOSIS — M25512 Pain in left shoulder: Secondary | ICD-10-CM

## 2023-02-11 DIAGNOSIS — E876 Hypokalemia: Secondary | ICD-10-CM | POA: Diagnosis not present

## 2023-02-11 DIAGNOSIS — F330 Major depressive disorder, recurrent, mild: Secondary | ICD-10-CM

## 2023-02-11 DIAGNOSIS — M353 Polymyalgia rheumatica: Secondary | ICD-10-CM | POA: Diagnosis not present

## 2023-02-11 DIAGNOSIS — G8929 Other chronic pain: Secondary | ICD-10-CM | POA: Diagnosis not present

## 2023-02-11 DIAGNOSIS — E7849 Other hyperlipidemia: Secondary | ICD-10-CM | POA: Diagnosis not present

## 2023-02-11 NOTE — Telephone Encounter (Signed)
High Risk Warning Populated when attempting to refill, I will send to Provider for further review 

## 2023-02-11 NOTE — Progress Notes (Signed)
[facility]:  Clinic (12)  Provider:   Code Status:  Goals of Care:     02/11/2023   10:42 AM  Advanced Directives  Does Patient Have a Medical Advance Directive? Yes  Type of Estate agent of [place];Living will  Does patient want to make changes to medical advance directive? No - Patient declined  Copy of Healthcare Power of Attorney in Chart? No - copy requested     Chief Complaint  Patient presents with   Medical Management of Chronic Issues    Patient is here for a follow up. Patient would like to discuss some concerns     HPI: Patient is a 89 y.o. female seen today for an acute visit for Follow up and review labs  Lives [place] in [facility]   Recent diagnosis of PMR Follows with Rheumatology Slow taper of Prednisone per them    Right now on 3 mg and Plan to go to 2 mg next month  Osteoporosis Tscore -3 On Reclast Per Rheumatology  Depression and Fatigue Was Trying to taper Trazodone and Failed Back on Trazadone  Continues to work with therapy C/o Fatigue Some Pain in her back but does not seems related to PMR Takes tylenol to help   Constipation Doing Metamucil Also H/o  Nausea and abdominal pain EGD in 11/22 Mild inflammation characterized by erythema, friability and granularity was found in the gastric antrum. Manometry in 09/20 Normal in EG junction and no Motility issue CT scan of abdomen in 05/22 and 5/23 negative [place] 05/23 negative     Daughter in [place] in [place] and [place] in [place]      Past Medical History:  Diagnosis Date   Anxiety    Arthritis    "qwhere; but it doesn't hurt" (05/12/2012)   Aspiration pneumonia (HCC) 03/21/2016   Cataract    left   Compression fracture of first lumbar vertebra (HCC) 07/2016   superior endplate   Depression    Dysphagia    "have had it for 7 yr; got worse 4 days ago" (05/12/2012)   Dysphagia, pharyngoesophageal phase - suspected  05/10/2012   GERD (gastroesophageal reflux disease)    Hypertension    Insomnia    Lumbago with sciatica, right side    Migraines    "menopause cured them" (05/12/2012)   Osteoporosis    Stage 3 chronic kidney disease (HCC)    Uterine prolapse     Past Surgical History:  Procedure Laterality Date   CATARACT EXTRACTION W/ INTRAOCULAR LENS IMPLANT Left ? 2010   ESOPHAGEAL MANOMETRY N/A 11/08/2018   Procedure: ESOPHAGEAL MANOMETRY (EM);  Surgeon: Iva Boop, MD;  Location: [facility];  Service: Endoscopy;  Laterality: N/A;   ESOPHAGOGASTRODUODENOSCOPY N/A 05/10/2012   Procedure: ESOPHAGOGASTRODUODENOSCOPY (EGD);  Surgeon: Beverley Fiedler, MD;  Location: [facility];  Service: Gastroenterology;  Laterality: N/A;   TONSILLECTOMY  1940's    Allergies  Allergen Reactions   Lactose Intolerance (Gi) Other (See Comments)    Gi tract issues   Lactulose Other (See Comments)    Gi tract issues   Lactose     Other Reaction(s): GI   Sulfa Antibiotics Hives    Childhood allergy     Outpatient Encounter Medications as of 02/11/2023  Medication Sig   amLODipine (NORVASC) 10 MG tablet TAKE ONE TABLET BY MOUTH DAILY   atenolol (TENORMIN) 50 MG tablet TAKE ONE TABLET BY MOUTH DAILY   Cholecalciferol (  VITAMIN D3) 25 MCG (1000 UT) CAPS Take by mouth daily.   famotidine (PEPCID) 40 MG tablet Take 1 tablet (40 mg total) by mouth 2 (two) times daily.   LORazepam (ATIVAN) 0.5 MG tablet TAKE ONE TABLET BY MOUTH TWICE DAILY AS NEEDED FOR ANXIETY   mirtazapine (REMERON) 30 MG tablet Take 1 tablet (30 mg total) by mouth at bedtime.   predniSONE (DELTASONE) 5 MG tablet Take 5 mg by mouth daily with breakfast.   Psyllium (METAMUCIL 4 IN 1 FIBER) 25 % PACK 1 packet with 8 ounces of liquid as needed Orally Once a day for 30 day(s)   sertraline (ZOLOFT) 20 MG/ML concentrated solution TAKE ONE TEASPOONFUL ( ) BY MOUTH ONCE DAILY   SHINGRIX injection Inject 0.5 mLs into the muscle once.   traZODone  (DESYREL) 50 MG tablet TAKE ONE TABLET BY MOUTH AT BEDTIME   vitamin B-12 (CYANOCOBALAMIN) 1000 MCG tablet Take 1 tablet (1,000 mcg total) by mouth daily.   No facility-administered encounter medications on file as of 02/11/2023.    Review of Systems:  Review of Systems  Constitutional:  Positive for activity change. Negative for appetite change.  HENT: Negative.    Respiratory:  Negative for cough and shortness of breath.   Cardiovascular:  Negative for leg swelling.  Gastrointestinal:  Positive for constipation.  Genitourinary: Negative.   Musculoskeletal:  Positive for back pain. Negative for arthralgias, gait problem and myalgias.  Skin: Negative.   Neurological:  Positive for weakness. Negative for dizziness.  Psychiatric/Behavioral:  Positive for dysphoric mood. Negative for confusion and sleep disturbance.     Health Maintenance  Topic Date Due   Medicare Annual Wellness (AWV)  01/10/2023   COVID-19 Vaccine (10 - 2024-25 season) 01/20/2023   Zoster Vaccines- Shingrix (2 of 2) 04/16/2023 (Originally 08/04/2022)   DTaP/Tdap/Td (2 - Td or Tdap) 12/19/2027   Pneumonia Vaccine 45+ Years old  Completed   INFLUENZA VACCINE  Completed   DEXA SCAN  Completed   HPV VACCINES  Aged Out    Physical Exam: Vitals:   02/11/23 1039  BP: 120/68  Pulse: 67  Resp: 16  Temp: 97.8 F (36.6 C)  TempSrc: Temporal  SpO2: 96%  Weight: 119 lb 11.2 oz (54.3 kg)  Height: 5' (1.524 m)   Body mass index is 23.38 kg/m. Physical Exam Vitals reviewed.  Constitutional:      Appearance: Normal appearance.  HENT:     Head: Normocephalic.     Nose: Nose normal.     Mouth/Throat:     Mouth: Mucous membranes are moist.     Pharynx: Oropharynx is clear.  Eyes:     Pupils: Pupils are equal, round, and reactive to light.  Cardiovascular:     Rate and Rhythm: Normal rate and regular rhythm.     Pulses: Normal pulses.     Heart sounds: Normal heart sounds. No murmur heard. Pulmonary:      Effort: Pulmonary effort is normal.     Breath sounds: Normal breath sounds.  Abdominal:     General: Abdomen is flat. Bowel sounds are normal.     Palpations: Abdomen is soft.  Musculoskeletal:        General: No swelling.     Cervical back: Neck supple.  Skin:    General: Skin is warm.  Neurological:     General: No focal deficit present.     Mental Status: She is alert and oriented to person, place, and time.  Psychiatric:  Mood and Affect: Mood normal.        Thought Content: Thought content normal.     Labs reviewed: Basic Metabolic Panel: Recent Labs    03/20/22 0800 01/08/23 0806 02/02/23 0800  NA 143 142 142  K 3.6 3.4* 3.7  CL 107 105 105  CO2 25 30 30   GLUCOSE 103* 94 85  BUN 25 15 20   CREATININE 0.88 0.83 0.87  CALCIUM 9.5 8.8 9.2  MG  --   --  1.9  TSH 1.60 4.02  --    Liver Function Tests: Recent Labs    03/20/22 0800 01/08/23 0806  AST 13 15  ALT 14 17  BILITOT 0.6 0.8  PROT 6.6 6.0*   No results for input(s): "LIPASE", "AMYLASE" in the last 8760 hours. No results for input(s): "AMMONIA" in the last 8760 hours. CBC: Recent Labs    03/20/22 0800 01/08/23 0806  WBC 10.3 6.2  NEUTROABS 8,271* 3,553  HGB 12.2 12.7  HCT 36.9 38.8  MCV 90.0 94.6  PLT 306 216   Lipid Panel: Recent Labs    03/20/22 0800 01/08/23 0806  CHOL 185 241*  HDL 42* 58  LDLCALC 124* 161*  TRIG 90 103  CHOLHDL 4.4 4.2   Lab Results  Component Value Date   HGBA1C 5.5 10/24/2021    Procedures since last visit: No results found.  Assessment/Plan 1. PMR (polymyalgia rheumatica) (HCC) (Primary) On 3 mg of Prednisone per Rheumatology Her pain and weakness seems to be stable I reassured her again Continues to work with therapy PRn  2. Age-related osteoporosis without current pathological fracture Reclast per rheumatology  3. Chronic left shoulder pain Works with therapy  4. Other hyperlipidemia Not doing statin yet due to her Chronic Muscle  pain Diet modification  5. Hypokalemia Potassium good level    6. Primary insomnia Trazodone Failed taper  7. Mild episode of recurrent major depressive disorder (HCC) Zoloft and Ativan  8. Vitamin B 12 deficiency Levels were good 9 Vit d Def Start 2000 units QD    Labs/tests ordered:  * No order type specified * Next appt:  03/09/2023

## 2023-03-09 ENCOUNTER — Encounter: Payer: Self-pay | Admitting: Orthopedic Surgery

## 2023-03-09 ENCOUNTER — Non-Acute Institutional Stay: Payer: Medicare Other | Admitting: Orthopedic Surgery

## 2023-03-09 VITALS — BP 120/60 | HR 78 | Temp 98.5°F | Resp 16 | Ht 60.0 in | Wt 119.2 lb

## 2023-03-09 DIAGNOSIS — Z0000 Encounter for general adult medical examination without abnormal findings: Secondary | ICD-10-CM

## 2023-03-09 NOTE — Progress Notes (Signed)
 Subjective:   Savannah Diaz is a 89 y.o. female who presents for Medicare Annual (Subsequent) preventive examination.  Visit Complete: In person  Patient Medicare AWV questionnaire was completed by the patient on 03/09/2023; I have confirmed that all information answered by patient is correct and no changes since this date.  Cardiac Risk Factors include: advanced age (>80men, >31 women);hypertension     Objective:    Today's Vitals   03/09/23 1416  BP: 120/60  Pulse: 78  Resp: 16  Temp: 98.5 F (36.9 C)  SpO2: 95%  Weight: 119 lb 3.2 oz (54.1 kg)  Height: 5' (1.524 m)   Body mass index is 23.28 kg/m.     03/09/2023    2:24 PM 02/11/2023   10:42 AM 01/14/2023   10:22 AM 09/10/2022   10:44 AM 06/18/2022    2:32 PM 04/30/2022    1:11 PM 04/23/2022    2:14 PM  Advanced Directives  Does Patient Have a Medical Advance Directive? Yes Yes Yes Yes Yes Yes Yes  Type of Estate Agent of [place];Living will Healthcare Power of [place];Living will Healthcare Power of [place];Living will Living will;Healthcare Power of [place];Out of facility DNR (pink MOST or yellow form) Healthcare Power of [place];Living will Healthcare Power of [place];Living will Healthcare Power of [place];Living will  Does patient want to make changes to medical advance directive? No - Patient declined No - Patient declined No - Patient declined No - Patient declined No - Patient declined No - Patient declined No - Patient declined  Copy of Healthcare Power of Attorney in Chart? No - copy requested No - copy requested No - copy requested No - copy requested Yes - validated most recent copy scanned in chart (See row information) Yes - validated most recent copy scanned in chart (See row information) Yes - validated most recent copy scanned in chart (See row information)    Current Medications (verified) Outpatient Encounter Medications as of 03/09/2023  Medication Sig   amLODipine   (NORVASC ) 10 MG tablet TAKE ONE TABLET BY MOUTH DAILY   atenolol  (TENORMIN ) 50 MG tablet TAKE ONE TABLET BY MOUTH DAILY   Cholecalciferol (VITAMIN D3) 25 MCG (1000 UT) CAPS Take by mouth daily.   famotidine  (PEPCID ) 40 MG tablet TAKE ONE TABLET BY MOUTH TWICE DAILY   LORazepam  (ATIVAN ) 0.5 MG tablet TAKE ONE TABLET BY MOUTH TWICE DAILY AS NEEDED FOR ANXIETY   mirtazapine  (REMERON ) 30 MG tablet Take 1 tablet (30 mg total) by mouth at bedtime.   predniSONE  (DELTASONE ) 1 MG tablet Take 3 mg by mouth daily with breakfast.   Psyllium (METAMUCIL 4 IN 1 FIBER) 25 % PACK 1 packet with 8 ounces of liquid as needed Orally Once a day for 30 day(s)   sertraline  (ZOLOFT ) 20 MG/ML concentrated solution TAKE ONE TEASPOONFUL ( ) BY MOUTH ONCE DAILY   traZODone  (DESYREL ) 50 MG tablet TAKE ONE TABLET BY MOUTH AT BEDTIME   vitamin B-12 (CYANOCOBALAMIN ) 1000 MCG tablet Take 1 tablet (1,000 mcg total) by mouth daily.   [DISCONTINUED] SHINGRIX injection Inject 0.5 mLs into the muscle once.   No facility-administered encounter medications on file as of 03/09/2023.    Allergies (verified) Lactose intolerance (gi), Lactulose, Lactose, and Sulfa antibiotics   History: Past Medical History:  Diagnosis Date   Anxiety    Arthritis    qwhere; but it doesn't hurt (05/12/2012)   Aspiration pneumonia (HCC) 03/21/2016   Cataract    left   Compression fracture of first  lumbar vertebra (HCC) 07/2016   superior endplate   Depression    Dysphagia    have had it for 7 yr; got worse 4 days ago (05/12/2012)   Dysphagia, pharyngoesophageal phase - suspected 05/10/2012   GERD (gastroesophageal reflux disease)    Hypertension    Insomnia    Lumbago with sciatica, right side    Migraines    menopause cured them (05/12/2012)   Osteoporosis    Stage 3 chronic kidney disease (HCC)    Uterine prolapse    Past Surgical History:  Procedure Laterality Date   CATARACT EXTRACTION W/ INTRAOCULAR LENS IMPLANT Left ? 2010    ESOPHAGEAL MANOMETRY N/A 11/08/2018   Procedure: ESOPHAGEAL MANOMETRY (EM);  Surgeon: Avram Lupita BRAVO, MD;  Location: [facility];  Service: Endoscopy;  Laterality: N/A;   ESOPHAGOGASTRODUODENOSCOPY N/A 05/10/2012   Procedure: ESOPHAGOGASTRODUODENOSCOPY (EGD);  Surgeon: Gordy CHRISTELLA Starch, MD;  Location: [facility];  Service: Gastroenterology;  Laterality: N/A;   TONSILLECTOMY  1940's   Family History  Problem Relation Age of Onset   Arthritis Mother    Hypertension Mother    Dementia Mother    Colon cancer Father    Stomach cancer Daughter    Esophageal cancer Neg Hx    Pancreatic cancer Neg Hx    Social History   Socioeconomic History   Marital status: Widowed    Spouse name: Not on file   Number of children: 2   Years of education: Not on file   Highest education level: Not on file  Occupational History   Occupation: Retired  Tobacco Use   Smoking status: Never   Smokeless tobacco: Never  Vaping Use   Vaping status: Never Used  Substance and Sexual Activity   Alcohol use: Not Currently    Comment: 05/12/2012 have a drink hardly ever anymore   Drug use: No   Sexual activity: Not Currently  Other Topics Concern   Not on file  Social History Narrative   Widowed, retired, 2 daughters husband was a academic librarian professor at [employer]   Her daughters and son-in-law's are all philosophy professors to it [employer] [place], daughter here at [employer] son-in-law at [employer]   Lives at [facility]   No Etoh/tobacco   Back exercises and walking are patient forms of exercising.     Social Drivers of Corporate Investment Banker Strain: Low Risk  (03/09/2023)   Overall Financial Resource Strain (CARDIA)    Difficulty of Paying Living Expenses: Not hard at all  Food Insecurity: No Food Insecurity (03/09/2023)   Hunger Vital Sign    Worried About Running Out of Food in the Last Year: Never true    Ran Out of Food in the Last Year: Never true  Transportation Needs: No  Transportation Needs (03/09/2023)   PRAPARE - Administrator, Civil Service (Medical): No    Lack of Transportation (Non-Medical): No  Physical Activity: Insufficiently Active (03/09/2023)   Exercise Vital Sign    Days of Exercise per Week: 3 days    Minutes of Exercise per Session: 40 min  Stress: No Stress Concern Present (03/09/2023)   [employer] of Occupational Health - Occupational Stress Questionnaire    Feeling of Stress : Only a little  Social Connections: Socially Isolated (03/09/2023)   Social Connection and Isolation Panel [NHANES]    Frequency of Communication with Friends and Family: More than three times a week    Frequency of Social Gatherings with Friends  and Family: Twice a week    Attends Religious Services: Never    Active Member of Clubs or Organizations: No    Attends Banker Meetings: Never    Marital Status: Widowed    Tobacco Counseling Counseling given: Not Answered   Clinical Intake:  Pre-visit preparation completed: No  Pain : No/denies pain     BMI - recorded: 23.28 Nutritional Status: BMI of 19-24  Normal Nutritional Risks: None Diabetes: No  How often do you need to have someone help you when you read instructions, pamphlets, or other written materials from your doctor or pharmacy?: 2 - Rarely What is the last grade level you completed in school?: 2 years college  Interpreter Needed?: No      Activities of Daily Living    03/09/2023    2:46 PM  In your present state of health, do you have any difficulty performing the following activities:  Hearing? 0  Vision? 0  Difficulty concentrating or making decisions? 0  Walking or climbing stairs? 0  Dressing or bathing? 0  Doing errands, shopping? 1  Comment does not drive  Preparing Food and eating ? N  Using the Toilet? N  In the past six months, have you accidently leaked urine? Y  Do you have problems with loss of bowel control? N  Managing your  Medications? N  Managing your Finances? N  Housekeeping or managing your Housekeeping? N    Patient Care Team: Charlanne Fredia CROME, MD as PCP - General (Internal Medicine) Patel, Donika K, DO as Consulting Physician (Neurology)  Indicate any recent Medical Services you may have received from other than [facility] providers in the past year (date may be approximate).     Assessment:   This is a routine wellness examination for Arnika.  Hearing/Vision screen Hearing Screening - Comments:: No hearing concerns.  Vision Screening - Comments:: No vision concerns. Patient wears prescription glasses. Patient last eye exam a while ago.    Goals Addressed             This Visit's Progress    Maintain Mobility and Function   On track    Evidence-based guidance:  Emphasize the importance of physical activity and aerobic exercise as included in treatment plan; assess barriers to adherence; consider patient's abilities and preferences.  Encourage gradual increase in activity or exercise instead of stopping if pain occurs.  Reinforce individual therapy exercise prescription, such as strengthening, stabilization and stretching programs.  Promote optimal body mechanics to stabilize the spine with lifting and functional activity.  Encourage activity and mobility modifications to facilitate optimal function, such as using a log roll for bed mobility or dressing from a seated position.  Reinforce individual adaptive equipment recommendations to limit excessive spinal movements, such as a event organiser.  Assess adequacy of sleep; encourage use of sleep hygiene techniques, such as bedtime routine; use of white noise; dark, cool bedroom; avoiding daytime naps, heavy meals or exercise before bedtime.  Promote positions and modification to optimize sleep and sexual activity; consider pillows or positioning devices to assist in maintaining neutral spine.  Explore options for applying ergonomic principles at  work and home, such as frequent position changes, using ergonomically designed equipment and working at optimal height.  Promote modifications to increase comfort with driving such as lumbar support, optimizing seat and steering wheel position, using cruise control and taking frequent rest stops to stretch and walk.   Notes:        Depression Screen  03/09/2023    2:23 PM 02/11/2023   10:41 AM 06/18/2022    2:31 PM 04/30/2022    1:10 PM 04/02/2022    3:14 PM 02/05/2022    2:19 PM 01/09/2022    1:30 PM  PHQ 2/9 Scores  PHQ - 2 Score 0 0 0 6 6 0 0  PHQ- 9 Score    10 14      Fall Risk    03/09/2023    2:51 PM 03/09/2023    2:23 PM 02/11/2023   10:41 AM 01/14/2023   10:22 AM 06/18/2022    2:31 PM  Fall Risk   Falls in the past year? 0 0 0 0 0  Number falls in past yr: 0 0 0 0 0  Injury with Fall? 0 0 0 0 0  Risk for fall due to : History of fall(s);Impaired balance/gait No Fall Risks No Fall Risks No Fall Risks History of fall(s)  Follow up Falls evaluation completed;Education provided Falls evaluation completed;Education provided;Falls prevention discussed Falls evaluation completed Falls evaluation completed Falls evaluation completed    MEDICARE RISK AT HOME: Medicare Risk at Home Any stairs in or around the home?: No Home free of loose throw rugs in walkways, pet beds, electrical cords, etc?: Yes Adequate lighting in your home to reduce risk of falls?: Yes Life alert?: Yes Use of a cane, walker or w/c?: No Grab bars in the bathroom?: Yes Shower chair or bench in shower?: Yes Elevated toilet seat or a handicapped toilet?: Yes  TIMED UP AND GO:  Was the test performed?  Yes  Length of time to ambulate 10 feet: 6 sec Gait steady and fast without use of assistive device    Cognitive Function:    03/09/2023    2:25 PM  MMSE - Mini Mental State Exam  Orientation to time 5  Orientation to Place 3  Registration 3  Attention/ Calculation 5  Recall 2  Language- name 2  objects 1  Language- repeat 1  Language- follow 3 step command 3  Language- read & follow direction 1  Write a sentence 1  Copy design 1  Total score 26        01/09/2022    1:37 PM  6CIT Screen  What Year? 0 points  What month? 0 points  What time? 0 points  Count back from 20 0 points  Months in reverse 0 points  Repeat phrase 0 points  Total Score 0 points    Immunizations Immunization History  Administered Date(s) Administered   Fluad Quad(high Dose 65+) 12/11/2021   Influenza Split 10/16/2017, 10/28/2019, 11/29/2020   Influenza Whole 12/31/2003, 12/29/2004, 12/27/2006, 12/12/2007, 12/03/2008   Influenza, High Dose Seasonal PF 11/12/2012, 11/06/2015, 12/12/2015, 11/14/2016, 12/10/2018, 11/23/2022   Influenza, Seasonal, Injecte, Preservative Fre 11/24/2009, 11/24/2010, 11/20/2013, 11/16/2014   Influenza-Unspecified 10/26/2011, 10/25/2013, 11/06/2015   Moderna Covid-19 Fall Seasonal Vaccine 54yrs & older 12/22/2021, 07/19/2022   Moderna Covid-19 Vaccine  Bivalent Booster 81yrs & up 11/14/2020, 08/07/2021, 11/25/2022   Moderna Sars-Covid-2 Vaccination 02/28/2019, 03/28/2019, 01/09/2020, 06/25/2020   Pneumococcal Conjugate-13 08/08/2013   Pneumococcal Polysaccharide-23 11/13/2006, 11/14/2016   Td (Adult) 11/13/2006   Tdap 12/18/2017   Unspecified SARS-COV-2 Vaccination 11/23/2022   Zoster Recombinant(Shingrix) 06/05/2022, 06/09/2022, 11/18/2022   Zoster, Live 08/27/2010, 12/27/2018, 04/27/2019    TDAP status: Up to date  Flu Vaccine status: Up to date  Pneumococcal vaccine status: Up to date  Covid-19 vaccine status: Completed vaccines  Qualifies for Shingles Vaccine? Yes   Zostavax completed Yes  Shingrix Completed?: Yes  Screening Tests Health Maintenance  Topic Date Due   COVID-19 Vaccine (10 - 2024-25 season) 01/20/2023   Medicare Annual Wellness (AWV)  03/08/2024   DTaP/Tdap/Td (2 - Td or Tdap) 12/19/2027   Pneumonia Vaccine 42+ Years old  Completed    INFLUENZA VACCINE  Completed   DEXA SCAN  Completed   Zoster Vaccines- Shingrix  Completed   HPV VACCINES  Aged Out    Health Maintenance  Health Maintenance Due  Topic Date Due   COVID-19 Vaccine (10 - 2024-25 season) 01/20/2023    Colorectal cancer screening: No longer required.   Mammogram status: No longer required due to advanced age.  Bone Density status: Completed 02/2022. Results reflect: Bone density results: OSTEOPOROSIS. Repeat every 2 years.  Lung Cancer Screening: (Low Dose CT Chest recommended if Age 6-80 years, 20 pack-year currently smoking OR have quit w/in 15years.) does not qualify.   Lung Cancer Screening Referral: No  Additional Screening:  Hepatitis C Screening: does not qualify; Completed   Vision Screening: Recommended annual ophthalmology exams for early detection of glaucoma and other disorders of the eye. Is the patient up to date with their annual eye exam?  Yes  Who is the provider or what is the name of the office in which the patient attends annual eye exams? Dr. Octavia If pt is not established with a provider, would they like to be referred to a provider to establish care? No .   Dental Screening: Recommended annual dental exams for proper oral hygiene  Diabetic Foot Exam: Diabetic Foot Exam: Completed 03/09/2023  Community Resource Referral / Chronic Care Management: CRR required this visit?  No   CCM required this visit?  No     Plan:     I have personally reviewed and noted the following in the patient's chart:   Medical and social history Use of alcohol, tobacco or illicit drugs  Current medications and supplements including opioid prescriptions. Patient is not currently taking opioid prescriptions. Functional ability and status Nutritional status Physical activity Advanced directives List of other physicians Hospitalizations, surgeries, and ER visits in previous 12 months Vitals Screenings to include cognitive,  depression, and falls Referrals and appointments  In addition, I have reviewed and discussed with patient certain preventive protocols, quality metrics, and best practice recommendations. A written personalized care plan for preventive services as well as general preventive health recommendations were provided to patient.     Greig FORBES Cluster, NP   03/09/2023   After Visit Summary: (MyChart) Due to this being a telephonic visit, the after visit summary with patients personalized plan was offered to patient via MyChart   Nurse Notes: MMSE 26/30, UTD on vaccinations. No future mammograms. DEXA 02/2022. Discussed Prevnar 20.

## 2023-03-09 NOTE — Patient Instructions (Signed)
  Ms. Dehaan , Thank you for taking time to come for your Medicare Wellness Visit. I appreciate your ongoing commitment to your health goals. Please review the following plan we discussed and let me know if I can assist you in the future.   These are the goals we discussed:  Goals      Maintain Mobility and Function     Evidence-based guidance:  Emphasize the importance of physical activity and aerobic exercise as included in treatment plan; assess barriers to adherence; consider patient's abilities and preferences.  Encourage gradual increase in activity or exercise instead of stopping if pain occurs.  Reinforce individual therapy exercise prescription, such as strengthening, stabilization and stretching programs.  Promote optimal body mechanics to stabilize the spine with lifting and functional activity.  Encourage activity and mobility modifications to facilitate optimal function, such as using a log roll for bed mobility or dressing from a seated position.  Reinforce individual adaptive equipment recommendations to limit excessive spinal movements, such as a event organiser.  Assess adequacy of sleep; encourage use of sleep hygiene techniques, such as bedtime routine; use of white noise; dark, cool bedroom; avoiding daytime naps, heavy meals or exercise before bedtime.  Promote positions and modification to optimize sleep and sexual activity; consider pillows or positioning devices to assist in maintaining neutral spine.  Explore options for applying ergonomic principles at work and home, such as frequent position changes, using ergonomically designed equipment and working at optimal height.  Promote modifications to increase comfort with driving such as lumbar support, optimizing seat and steering wheel position, using cruise control and taking frequent rest stops to stretch and walk.   Notes:         This is a list of the screening recommended for you and due dates:  Health  Maintenance  Topic Date Due   COVID-19 Vaccine (10 - 2024-25 season) 01/20/2023   Medicare Annual Wellness Visit  03/08/2024   DTaP/Tdap/Td vaccine (2 - Td or Tdap) 12/19/2027   Pneumonia Vaccine  Completed   Flu Shot  Completed   DEXA scan (bone density measurement)  Completed   Zoster (Shingles) Vaccine  Completed   HPV Vaccine  Aged Out

## 2023-03-12 DIAGNOSIS — R5383 Other fatigue: Secondary | ICD-10-CM | POA: Diagnosis not present

## 2023-03-12 DIAGNOSIS — Z6823 Body mass index (BMI) 23.0-23.9, adult: Secondary | ICD-10-CM | POA: Diagnosis not present

## 2023-03-12 DIAGNOSIS — M2560 Stiffness of unspecified joint, not elsewhere classified: Secondary | ICD-10-CM | POA: Diagnosis not present

## 2023-03-12 DIAGNOSIS — Z79899 Other long term (current) drug therapy: Secondary | ICD-10-CM | POA: Diagnosis not present

## 2023-03-12 DIAGNOSIS — M810 Age-related osteoporosis without current pathological fracture: Secondary | ICD-10-CM | POA: Diagnosis not present

## 2023-03-12 DIAGNOSIS — M353 Polymyalgia rheumatica: Secondary | ICD-10-CM | POA: Diagnosis not present

## 2023-03-14 ENCOUNTER — Other Ambulatory Visit: Payer: Self-pay | Admitting: Orthopedic Surgery

## 2023-03-16 NOTE — Telephone Encounter (Signed)
High risk or very high risk warning populated when attempting to refill medication. RX request sent to PCP for review and approval if warranted.

## 2023-03-20 ENCOUNTER — Telehealth: Payer: Self-pay

## 2023-03-20 NOTE — Telephone Encounter (Signed)
Patient states her rheumatologist increased her to 7.5 mg of prednisone and states she only slept 2 hours last night. Patient states the [place] states she should take melatonin. Patient would like to know is this a good idea and how many mg ?

## 2023-03-20 NOTE — Telephone Encounter (Signed)
Called and discussed response with patient

## 2023-03-25 ENCOUNTER — Other Ambulatory Visit: Payer: Self-pay | Admitting: Internal Medicine

## 2023-03-25 NOTE — Telephone Encounter (Signed)
Pharmacy requested refill.  Pended Rx and sent to Dr. Chales Abrahams for approval due to HIGH ALERT Warning.

## 2023-03-28 DIAGNOSIS — R011 Cardiac murmur, unspecified: Secondary | ICD-10-CM | POA: Diagnosis not present

## 2023-03-28 DIAGNOSIS — H6691 Otitis media, unspecified, right ear: Secondary | ICD-10-CM | POA: Diagnosis not present

## 2023-04-01 DIAGNOSIS — M6281 Muscle weakness (generalized): Secondary | ICD-10-CM | POA: Diagnosis not present

## 2023-04-01 DIAGNOSIS — M353 Polymyalgia rheumatica: Secondary | ICD-10-CM | POA: Diagnosis not present

## 2023-04-02 DIAGNOSIS — Z23 Encounter for immunization: Secondary | ICD-10-CM | POA: Diagnosis not present

## 2023-04-06 DIAGNOSIS — M353 Polymyalgia rheumatica: Secondary | ICD-10-CM | POA: Diagnosis not present

## 2023-04-06 DIAGNOSIS — M6281 Muscle weakness (generalized): Secondary | ICD-10-CM | POA: Diagnosis not present

## 2023-04-07 DIAGNOSIS — M6281 Muscle weakness (generalized): Secondary | ICD-10-CM | POA: Diagnosis not present

## 2023-04-07 DIAGNOSIS — M353 Polymyalgia rheumatica: Secondary | ICD-10-CM | POA: Diagnosis not present

## 2023-04-08 ENCOUNTER — Non-Acute Institutional Stay: Payer: Medicare Other | Admitting: Internal Medicine

## 2023-04-08 ENCOUNTER — Encounter: Payer: Self-pay | Admitting: Internal Medicine

## 2023-04-08 VITALS — HR 66 | Temp 98.6°F | Resp 17 | Ht 60.0 in | Wt 116.2 lb

## 2023-04-08 DIAGNOSIS — H6121 Impacted cerumen, right ear: Secondary | ICD-10-CM | POA: Diagnosis not present

## 2023-04-08 DIAGNOSIS — F330 Major depressive disorder, recurrent, mild: Secondary | ICD-10-CM

## 2023-04-08 DIAGNOSIS — M810 Age-related osteoporosis without current pathological fracture: Secondary | ICD-10-CM | POA: Diagnosis not present

## 2023-04-08 DIAGNOSIS — R011 Cardiac murmur, unspecified: Secondary | ICD-10-CM | POA: Diagnosis not present

## 2023-04-08 DIAGNOSIS — M353 Polymyalgia rheumatica: Secondary | ICD-10-CM | POA: Diagnosis not present

## 2023-04-09 DIAGNOSIS — M6281 Muscle weakness (generalized): Secondary | ICD-10-CM | POA: Diagnosis not present

## 2023-04-09 DIAGNOSIS — M353 Polymyalgia rheumatica: Secondary | ICD-10-CM | POA: Diagnosis not present

## 2023-04-09 NOTE — Progress Notes (Signed)
Location: [facility] Service:  Clinic (12)  Provider:   Code Status:  Goals of Care:     04/08/2023    2:49 PM  Advanced Directives  Does Patient Have a Medical Advance Directive? Yes  Type of Estate agent of [place];Living will  Does patient want to make changes to medical advance directive? No - Patient declined  Copy of Healthcare Power of Attorney in Chart? No - copy requested     Chief Complaint  Patient presents with   Medical Management of Chronic Issues    Patient is being seen for a follow up    HPI: Patient is a 89 y.o. female seen today for an acute visit for Follow up  Discussed the use of AI scribe software for clinical note transcription with the patient, who gave verbal consent to proceed.  History of Present Illness   Savannah Diaz is an 89 year old female with polymyalgia rheumatica who presents for follow-up regarding her prednisone management and recent ear pain. PMR  She has been managing her polymyalgia rheumatica with prednisone. Initially, she reduced her dose to 2 mg in January, which led to a return of symptoms. Her rheumatologist advised increasing the dose to 5 mg, but her symptoms persisted. She was then instructed to increase the dose to 7.5 mg for two to three weeks, which she did for about three weeks. She is now reducing the dose back to 5 mg. She is uncertain if her current pain is the same as before and notes increased arthritic symptoms in various areas. CRP done in Rheumatology office was 18  Acute Right Ear Otitis She experienced sharp, 'pingy' pains in her ear for 24 hours in late January, prompting a visit to urgent care. The provider noted redness in the affected ear and prescribed antibiotics, which she completed. Her ear no longer hurts, but she reports clicking sounds. Cardiac Murmur During the urgent care visit, a mild heart murmur was detected, which was unexpected as she had not been  previously informed of any heart issues. She had a normal echocardiogram in 2017 and has not yet scheduled a follow-up with a cardiologist. Night Sweats She reports experiencing night sweats, which she attributes to her prednisone use. She has a history of night sweats for many years, sometimes related to her menopausal state. She also notes hair breakage and increased anxiety, which she associates with prednisone. Insomnia Her sleep is managed with trazodone and she has recently started using melatonin. She takes 1 mg of melatonin occasionally but is considering increasing the dose to 3 mg. Depression She experiences anxiety, which may be worsened by prednisone. She is currently on lorazepam twice daily and Zoloft in liquid form, which she measures with a teaspoon.  She experiences fatigue and difficulty walking long distances.      Osteoporosis Tscore -3 On Reclast Per Rheumatology  Also H/o  Nausea and abdominal pain Detail Work up has been negative Resolved with low dose of Ativan    Daughter in [place] in [place] and [place] in [place] Past Medical History:  Diagnosis Date   Anxiety    Arthritis    "qwhere; but it doesn't hurt" (05/12/2012)   Aspiration pneumonia (HCC) 03/21/2016   Cataract    left   Compression fracture of first lumbar vertebra (HCC) 07/2016   superior endplate   Depression    Dysphagia    "have had it for 7 yr; got worse 4  days ago" (05/12/2012)   Dysphagia, pharyngoesophageal phase - suspected 05/10/2012   GERD (gastroesophageal reflux disease)    Hypertension    Insomnia    Lumbago with sciatica, right side    Migraines    "menopause cured them" (05/12/2012)   Osteoporosis    Stage 3 chronic kidney disease (HCC)    Uterine prolapse     Past Surgical History:  Procedure Laterality Date   CATARACT EXTRACTION W/ INTRAOCULAR LENS IMPLANT Left ? 2010   ESOPHAGEAL MANOMETRY N/A 11/08/2018   Procedure: ESOPHAGEAL MANOMETRY (EM);  Surgeon:  Iva Boop, MD;  Location: [facility];  Service: Endoscopy;  Laterality: N/A;   ESOPHAGOGASTRODUODENOSCOPY N/A 05/10/2012   Procedure: ESOPHAGOGASTRODUODENOSCOPY (EGD);  Surgeon: Beverley Fiedler, MD;  Location: [facility];  Service: Gastroenterology;  Laterality: N/A;   TONSILLECTOMY  1940's    Allergies  Allergen Reactions   Lactose Intolerance (Gi) Other (See Comments)    Gi tract issues   Lactulose Other (See Comments)    Gi tract issues   Lactose     Other Reaction(s): GI   Sulfa Antibiotics Hives    Childhood allergy     Outpatient Encounter Medications as of 04/08/2023  Medication Sig   amLODipine (NORVASC) 10 MG tablet TAKE ONE TABLET BY MOUTH DAILY   atenolol (TENORMIN) 50 MG tablet TAKE ONE TABLET BY MOUTH DAILY   Cholecalciferol (VITAMIN D3) 25 MCG (1000 UT) CAPS Take by mouth daily.   famotidine (PEPCID) 40 MG tablet TAKE ONE TABLET BY MOUTH TWICE DAILY   LORazepam (ATIVAN) 0.5 MG tablet TAKE ONE TABLET BY MOUTH TWICE DAILY AS NEEDED FOR ANXIETY   melatonin 3 MG TABS tablet Take 3 mg by mouth at bedtime as needed.   mirtazapine (REMERON) 30 MG tablet Take 1 tablet (30 mg total) by mouth at bedtime.   predniSONE (DELTASONE) 1 MG tablet Take 5 mg by mouth daily with breakfast.   Psyllium (METAMUCIL 4 IN 1 FIBER) 25 % PACK 1 packet with 8 ounces of liquid as needed Orally Once a day for 30 day(s)   sertraline (ZOLOFT) 20 MG/ML concentrated solution TAKE ONE TEASPOONFUL (5ml) BY MOUTH ONCE DAILY   traZODone (DESYREL) 50 MG tablet TAKE ONE TABLET BY MOUTH AT BEDTIME   vitamin B-12 (CYANOCOBALAMIN) 1000 MCG tablet Take 1 tablet (1,000 mcg total) by mouth daily.   No facility-administered encounter medications on file as of 04/08/2023.    Review of Systems:  Review of Systems  Constitutional:  Negative for activity change and appetite change.  HENT: Negative.    Respiratory:  Negative for cough and shortness of breath.   Cardiovascular:  Negative for leg swelling.   Gastrointestinal:  Negative for constipation.  Genitourinary: Negative.   Musculoskeletal:  Positive for arthralgias and myalgias. Negative for gait problem.  Skin: Negative.   Neurological:  Negative for dizziness and weakness.  Psychiatric/Behavioral:  Positive for dysphoric mood and sleep disturbance. Negative for confusion. The patient is nervous/anxious.     Health Maintenance  Topic Date Due   COVID-19 Vaccine (10 - 2024-25 season) 01/20/2023   Medicare Annual Wellness (AWV)  03/08/2024   DTaP/Tdap/Td (2 - Td or Tdap) 12/19/2027   Pneumonia Vaccine 69+ Years old  Completed   INFLUENZA VACCINE  Completed   DEXA SCAN  Completed   Zoster Vaccines- Shingrix  Completed   HPV VACCINES  Aged Out    Physical Exam: Vitals:   04/08/23 1447  Pulse: 66  Resp: 17  Temp: 98.6 F (  37 C)  TempSrc: Temporal  SpO2: 98%  Weight: 116 lb 3.2 oz (52.7 kg)  Height: 5' (1.524 m)   Body mass index is 22.69 kg/m. Physical Exam Vitals reviewed.  Constitutional:      Appearance: Normal appearance.  HENT:     Head: Normocephalic.     Left Ear: Tympanic membrane normal.     Ears:     Comments: Wax in Right Ear    Nose: Nose normal.     Mouth/Throat:     Mouth: Mucous membranes are moist.     Pharynx: Oropharynx is clear.  Eyes:     Pupils: Pupils are equal, round, and reactive to light.  Cardiovascular:     Rate and Rhythm: Normal rate and regular rhythm.     Pulses: Normal pulses.     Heart sounds: Normal heart sounds. No murmur heard. Pulmonary:     Effort: Pulmonary effort is normal.     Breath sounds: Normal breath sounds.  Abdominal:     General: Abdomen is flat. Bowel sounds are normal.     Palpations: Abdomen is soft.  Musculoskeletal:        General: No swelling.     Cervical back: Neck supple.  Skin:    General: Skin is warm.  Neurological:     General: No focal deficit present.     Mental Status: She is alert and oriented to person, place, and time.   Psychiatric:        Mood and Affect: Mood normal.        Thought Content: Thought content normal.     Labs reviewed: Basic Metabolic Panel: Recent Labs    01/08/23 0806 02/02/23 0800  NA 142 142  K 3.4* 3.7  CL 105 105  CO2 30 30  GLUCOSE 94 85  BUN 15 20  CREATININE 0.83 0.87  CALCIUM 8.8 9.2  MG  --  1.9  TSH 4.02  --    Liver Function Tests: Recent Labs    01/08/23 0806  AST 15  ALT 17  BILITOT 0.8  PROT 6.0*   No results for input(s): "LIPASE", "AMYLASE" in the last 8760 hours. No results for input(s): "AMMONIA" in the last 8760 hours. CBC: Recent Labs    01/08/23 0806  WBC 6.2  NEUTROABS 3,553  HGB 12.7  HCT 38.8  MCV 94.6  PLT 216   Lipid Panel: Recent Labs    01/08/23 0806  CHOL 241*  HDL 58  LDLCALC 161*  TRIG 103  CHOLHDL 4.2   Lab Results  Component Value Date   HGBA1C 5.5 10/24/2021    Procedures since last visit: No results found.  Assessment/Plan 1. PMR (polymyalgia rheumatica) (HCC) (Primary) Flare of symptoms with tapering of prednisone to 2mg  daily. Increased inflammatory markers (CRP 18). Rheumatologist increased prednisone to 7.5mg  daily for 3 weeks, now on 5mg  daily. -Continue prednisone 5mg  daily as per rheumatologist's instructions.  2. Age-related osteoporosis without current pathological fracture Reclast Per Rheumatology  3. Mild episode of recurrent major depressive disorder (HCC) Experiencing increased anxiety, possibly exacerbated by prednisone. Currently on Zoloft and Ativan. -Consider increasing Zoloft dose if symptoms persist after stabilization on prednisone 5mg  daily.  4. Murmur, cardiac Recent detection of mild murmur at urgent care. Last echocardiogram in 2017 was normal. - ECHOCARDIOGRAM COMPLETE; Future  5. Impacted cerumen of right ear -Plan for ear wash in a few weeks after healing from recent infection. 7 Insomnia Difficulty maintaining sleep. Currently on Trazodone and considering  Melatonin. -Trial Melatonin 3mg  at bedtime.  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 -No current intervention needed, continue with dietary modifications and swallowing techniques as previously instructed.  9 Night Sweats Experiencing intermittent night sweats, possibly related to prednisone or menopausal state. Follow-up in 6 weeks to reassess symptoms and response to prednisone 5mg  daily.        Labs/tests ordered:  * No order type specified * Next appt:  04/22/2023

## 2023-04-14 DIAGNOSIS — M353 Polymyalgia rheumatica: Secondary | ICD-10-CM | POA: Diagnosis not present

## 2023-04-14 DIAGNOSIS — M6281 Muscle weakness (generalized): Secondary | ICD-10-CM | POA: Diagnosis not present

## 2023-04-15 ENCOUNTER — Encounter: Payer: Self-pay | Admitting: Internal Medicine

## 2023-04-15 DIAGNOSIS — M353 Polymyalgia rheumatica: Secondary | ICD-10-CM | POA: Diagnosis not present

## 2023-04-15 DIAGNOSIS — M6281 Muscle weakness (generalized): Secondary | ICD-10-CM | POA: Diagnosis not present

## 2023-04-20 ENCOUNTER — Encounter: Payer: Self-pay | Admitting: Internal Medicine

## 2023-04-20 DIAGNOSIS — M6281 Muscle weakness (generalized): Secondary | ICD-10-CM | POA: Diagnosis not present

## 2023-04-20 DIAGNOSIS — M353 Polymyalgia rheumatica: Secondary | ICD-10-CM | POA: Diagnosis not present

## 2023-04-22 ENCOUNTER — Non-Acute Institutional Stay: Payer: Medicare Other | Admitting: Internal Medicine

## 2023-04-22 VITALS — BP 118/70 | HR 76 | Temp 98.0°F | Resp 17 | Ht 60.0 in | Wt 117.7 lb

## 2023-04-22 DIAGNOSIS — H6121 Impacted cerumen, right ear: Secondary | ICD-10-CM

## 2023-04-23 DIAGNOSIS — M6281 Muscle weakness (generalized): Secondary | ICD-10-CM | POA: Diagnosis not present

## 2023-04-23 DIAGNOSIS — M353 Polymyalgia rheumatica: Secondary | ICD-10-CM | POA: Diagnosis not present

## 2023-04-26 ENCOUNTER — Emergency Department (HOSPITAL_COMMUNITY): Admitting: Anesthesiology

## 2023-04-26 ENCOUNTER — Ambulatory Visit (HOSPITAL_BASED_OUTPATIENT_CLINIC_OR_DEPARTMENT_OTHER)
Admission: EM | Admit: 2023-04-26 | Discharge: 2023-04-26 | Disposition: A | Discharge status: Home / Self Care | Attending: Emergency Medicine | Admitting: Emergency Medicine

## 2023-04-26 ENCOUNTER — Emergency Department (HOSPITAL_BASED_OUTPATIENT_CLINIC_OR_DEPARTMENT_OTHER): Admitting: Anesthesiology

## 2023-04-26 ENCOUNTER — Encounter (HOSPITAL_BASED_OUTPATIENT_CLINIC_OR_DEPARTMENT_OTHER): Payer: Self-pay | Admitting: Emergency Medicine

## 2023-04-26 ENCOUNTER — Other Ambulatory Visit: Payer: Self-pay

## 2023-04-26 ENCOUNTER — Encounter (HOSPITAL_COMMUNITY)
Admission: EM | Disposition: A | Discharge status: Home / Self Care | Payer: Self-pay | Source: Home / Self Care | Attending: Emergency Medicine

## 2023-04-26 ENCOUNTER — Emergency Department (HOSPITAL_BASED_OUTPATIENT_CLINIC_OR_DEPARTMENT_OTHER)

## 2023-04-26 DIAGNOSIS — K222 Esophageal obstruction: Secondary | ICD-10-CM | POA: Diagnosis not present

## 2023-04-26 DIAGNOSIS — M1990 Unspecified osteoarthritis, unspecified site: Secondary | ICD-10-CM | POA: Diagnosis not present

## 2023-04-26 DIAGNOSIS — R1310 Dysphagia, unspecified: Secondary | ICD-10-CM | POA: Insufficient documentation

## 2023-04-26 DIAGNOSIS — F419 Anxiety disorder, unspecified: Secondary | ICD-10-CM | POA: Insufficient documentation

## 2023-04-26 DIAGNOSIS — Z7952 Long term (current) use of systemic steroids: Secondary | ICD-10-CM | POA: Diagnosis not present

## 2023-04-26 DIAGNOSIS — I129 Hypertensive chronic kidney disease with stage 1 through stage 4 chronic kidney disease, or unspecified chronic kidney disease: Secondary | ICD-10-CM | POA: Diagnosis not present

## 2023-04-26 DIAGNOSIS — I700 Atherosclerosis of aorta: Secondary | ICD-10-CM | POA: Diagnosis not present

## 2023-04-26 DIAGNOSIS — R519 Headache, unspecified: Secondary | ICD-10-CM | POA: Insufficient documentation

## 2023-04-26 DIAGNOSIS — K209 Esophagitis, unspecified without bleeding: Secondary | ICD-10-CM

## 2023-04-26 DIAGNOSIS — I10 Essential (primary) hypertension: Secondary | ICD-10-CM

## 2023-04-26 DIAGNOSIS — F32A Depression, unspecified: Secondary | ICD-10-CM | POA: Insufficient documentation

## 2023-04-26 DIAGNOSIS — K2100 Gastro-esophageal reflux disease with esophagitis, without bleeding: Secondary | ICD-10-CM | POA: Diagnosis not present

## 2023-04-26 DIAGNOSIS — J984 Other disorders of lung: Secondary | ICD-10-CM | POA: Diagnosis not present

## 2023-04-26 DIAGNOSIS — T18128A Food in esophagus causing other injury, initial encounter: Secondary | ICD-10-CM

## 2023-04-26 DIAGNOSIS — X58XXXA Exposure to other specified factors, initial encounter: Secondary | ICD-10-CM | POA: Diagnosis not present

## 2023-04-26 DIAGNOSIS — T18100A Unspecified foreign body in esophagus causing compression of trachea, initial encounter: Secondary | ICD-10-CM

## 2023-04-26 DIAGNOSIS — N183 Chronic kidney disease, stage 3 unspecified: Secondary | ICD-10-CM | POA: Diagnosis not present

## 2023-04-26 DIAGNOSIS — N289 Disorder of kidney and ureter, unspecified: Secondary | ICD-10-CM | POA: Diagnosis not present

## 2023-04-26 DIAGNOSIS — F418 Other specified anxiety disorders: Secondary | ICD-10-CM

## 2023-04-26 DIAGNOSIS — Z79899 Other long term (current) drug therapy: Secondary | ICD-10-CM | POA: Diagnosis not present

## 2023-04-26 DIAGNOSIS — W44F3XA Food entering into or through a natural orifice, initial encounter: Secondary | ICD-10-CM

## 2023-04-26 DIAGNOSIS — R0989 Other specified symptoms and signs involving the circulatory and respiratory systems: Secondary | ICD-10-CM | POA: Diagnosis not present

## 2023-04-26 HISTORY — PX: FOREIGN BODY REMOVAL: SHX962

## 2023-04-26 HISTORY — PX: ESOPHAGOGASTRODUODENOSCOPY: SHX5428

## 2023-04-26 LAB — CBC WITH DIFFERENTIAL/PLATELET
Abs Immature Granulocytes: 0.03 10*3/uL (ref 0.00–0.07)
Basophils Absolute: 0 10*3/uL (ref 0.0–0.1)
Basophils Relative: 0 %
Eosinophils Absolute: 0.1 10*3/uL (ref 0.0–0.5)
Eosinophils Relative: 1 %
HCT: 39.5 % (ref 36.0–46.0)
Hemoglobin: 13.3 g/dL (ref 12.0–15.0)
Immature Granulocytes: 0 %
Lymphocytes Relative: 16 %
Lymphs Abs: 1.3 10*3/uL (ref 0.7–4.0)
MCH: 30.6 pg (ref 26.0–34.0)
MCHC: 33.7 g/dL (ref 30.0–36.0)
MCV: 91 fL (ref 80.0–100.0)
Monocytes Absolute: 0.6 10*3/uL (ref 0.1–1.0)
Monocytes Relative: 8 %
Neutro Abs: 5.9 10*3/uL (ref 1.7–7.7)
Neutrophils Relative %: 75 %
Platelets: 252 10*3/uL (ref 150–400)
RBC: 4.34 MIL/uL (ref 3.87–5.11)
RDW: 13.7 % (ref 11.5–15.5)
WBC: 7.9 10*3/uL (ref 4.0–10.5)
nRBC: 0 % (ref 0.0–0.2)

## 2023-04-26 LAB — BASIC METABOLIC PANEL
Anion gap: 10 (ref 5–15)
BUN: 19 mg/dL (ref 8–23)
CO2: 27 mmol/L (ref 22–32)
Calcium: 9.3 mg/dL (ref 8.9–10.3)
Chloride: 105 mmol/L (ref 98–111)
Creatinine, Ser: 0.83 mg/dL (ref 0.44–1.00)
GFR, Estimated: >60 mL/min (ref >60)
Glucose, Bld: 136 mg/dL — ABNORMAL HIGH (ref 70–99)
Potassium: 3.2 mmol/L — ABNORMAL LOW (ref 3.5–5.1)
Sodium: 142 mmol/L (ref 135–145)

## 2023-04-26 SURGERY — EGD (ESOPHAGOGASTRODUODENOSCOPY)
Anesthesia: General

## 2023-04-26 MED ORDER — DEXAMETHASONE SODIUM PHOSPHATE 10 MG/ML IJ SOLN
INTRAMUSCULAR | Status: DC | PRN
  Administered 2023-04-26: 5 mg via INTRAVENOUS

## 2023-04-26 MED ORDER — PROPOFOL 10 MG/ML IV BOLUS
INTRAVENOUS | Status: DC | PRN
  Administered 2023-04-26: 20 mg via INTRAVENOUS
  Administered 2023-04-26: 70 mg via INTRAVENOUS

## 2023-04-26 MED ORDER — FENTANYL CITRATE (PF) 100 MCG/2ML IJ SOLN
INTRAMUSCULAR | Status: AC
  Filled 2023-04-26: qty 2

## 2023-04-26 MED ORDER — DIAZEPAM 5 MG/ML IJ SOLN
2.5000 mg | Freq: Once | INTRAMUSCULAR | Status: AC
  Administered 2023-04-26: 2.5 mg via INTRAVENOUS
  Filled 2023-04-26: qty 2

## 2023-04-26 MED ORDER — SODIUM CHLORIDE 0.9 % IV BOLUS
500.0000 mL | Freq: Once | INTRAVENOUS | Status: AC
  Administered 2023-04-26: 500 mL via INTRAVENOUS

## 2023-04-26 MED ORDER — FENTANYL CITRATE (PF) 250 MCG/5ML IJ SOLN
INTRAMUSCULAR | Status: DC | PRN
Start: 2023-04-26 — End: 2023-04-26
  Administered 2023-04-26: 25 ug via INTRAVENOUS

## 2023-04-26 MED ORDER — SODIUM CHLORIDE 0.9 % IV SOLN: INTRAVENOUS | Status: DC | PRN

## 2023-04-26 MED ORDER — FENTANYL CITRATE (PF) 100 MCG/2ML IJ SOLN: 25.0000 ug | INTRAMUSCULAR | Status: DC | PRN

## 2023-04-26 MED ORDER — GLUCAGON HCL RDNA (DIAGNOSTIC) 1 MG IJ SOLR
1.0000 mg | Freq: Once | INTRAMUSCULAR | Status: AC
  Administered 2023-04-26: 1 mg via INTRAVENOUS
  Filled 2023-04-26: qty 1

## 2023-04-26 MED ORDER — PHENYLEPHRINE 80 MCG/ML (10ML) SYRINGE FOR IV PUSH (FOR BLOOD PRESSURE SUPPORT)
PREFILLED_SYRINGE | INTRAVENOUS | Status: DC | PRN
  Administered 2023-04-26: 80 ug via INTRAVENOUS
  Administered 2023-04-26: 160 ug via INTRAVENOUS
  Administered 2023-04-26: 80 ug via INTRAVENOUS

## 2023-04-26 MED ORDER — LORAZEPAM 2 MG/ML IJ SOLN
0.5000 mg | Freq: Once | INTRAMUSCULAR | Status: AC
  Administered 2023-04-26: 0.5 mg via INTRAVENOUS
  Filled 2023-04-26: qty 1

## 2023-04-26 MED ORDER — SUCCINYLCHOLINE CHLORIDE 200 MG/10ML IV SOSY
PREFILLED_SYRINGE | INTRAVENOUS | Status: DC | PRN
  Administered 2023-04-26: 100 mg via INTRAVENOUS

## 2023-04-26 MED ORDER — LIDOCAINE 2% (20 MG/ML) 5 ML SYRINGE
INTRAMUSCULAR | Status: DC | PRN
Start: 2023-04-26 — End: 2023-04-26
  Administered 2023-04-26: 40 mg via INTRAVENOUS

## 2023-04-26 MED ORDER — ONDANSETRON HCL 4 MG/2ML IJ SOLN
INTRAMUSCULAR | Status: DC | PRN
  Administered 2023-04-26: 4 mg via INTRAVENOUS

## 2023-04-26 NOTE — ED Provider Notes (Signed)
 [place] EMERGENCY DEPARTMENT AT [facility] Provider Note   CSN: [id number] Arrival date & time: 04/26/23  0915     History  Chief Complaint  Patient presents with   Dysphagia    Savannah Diaz is a 89 y.o. female.  Patient having some trouble swallowing.  History of esophageal web.  But she has a lot of anxiety and muscle spasms that cause this as well.  She was eating a piece of chicken last night and felt uncomfortable and feels like she is little bit too afraid to try to drink and swallow.  Family member states that this is a chronic issue for her.  Often times when her anxiety improves her symptoms will improve.  She has not tried to drink any water this morning.  She started to have the symptoms last night with dinner around 730.  She denies any weakness numbness tingling.  She has been too afraid to swallow and therefore has not taken her Ativan.  She is requesting this with IV she is hoping that she will feel better after these medicines and be able to drink.  The history is provided by the patient.       Home Medications Prior to Admission medications   Medication Sig Start Date End Date Taking? Authorizing Provider  amLODipine (NORVASC) 10 MG tablet TAKE ONE TABLET BY MOUTH DAILY 02/10/23   [name], MD  atenolol (TENORMIN) 50 MG tablet TAKE ONE TABLET BY MOUTH DAILY 02/09/23   [name], MD  Cholecalciferol (VITAMIN D3) 25 MCG (1000 UT) CAPS Take by mouth daily.    [provider]  famotidine (PEPCID) 40 MG tablet TAKE ONE TABLET BY MOUTH TWICE DAILY 02/12/23   [name], MD  LORazepam (ATIVAN) 0.5 MG tablet TAKE ONE TABLET BY MOUTH TWICE DAILY AS NEEDED FOR ANXIETY 12/23/22   [name], MD  melatonin 3 MG TABS tablet Take 3 mg by mouth at bedtime as needed.    [provider]  mirtazapine (REMERON) 30 MG tablet Take 1 tablet (30 mg total) by mouth at bedtime. 03/16/23   Fargo, Amy E, NP  predniSONE (DELTASONE) 1 MG  tablet Take 5 mg by mouth daily with breakfast.    [provider]  Psyllium (METAMUCIL 4 IN 1 FIBER) 25 % PACK 1 packet with 8 ounces of liquid as needed Orally Once a day for 30 day(s)    [provider]  sertraline (ZOLOFT) 20 MG/ML concentrated solution TAKE ONE TEASPOONFUL (5ml) BY MOUTH ONCE DAILY 03/25/23   [name], MD  traZODone (DESYREL) 50 MG tablet TAKE ONE TABLET BY MOUTH AT BEDTIME 01/29/23   [name], MD  vitamin B-12 (CYANOCOBALAMIN) 1000 MCG tablet Take 1 tablet (1,000 mcg total) by mouth daily. 03/28/21   Iva Boop, MD      Allergies    Lactose intolerance (gi), Lactulose, Lactose, and Sulfa antibiotics    Review of Systems   Review of Systems  Physical Exam Updated Vital Signs BP (!) 153/105 (BP Location: Right Arm)   Pulse 87   Temp 97.8 F (36.6 C)   Resp 18   Ht 5' (1.524 m)   Wt 53 kg   SpO2 97%   BMI 22.82 kg/m  Physical Exam Vitals and nursing note reviewed.  Constitutional:      General: She is not in acute distress.    Appearance: She is well-developed. She is not ill-appearing.  HENT:  Head: Normocephalic and atraumatic.     Nose: Nose normal.     Mouth/Throat:     Mouth: Mucous membranes are moist.  Eyes:     Extraocular Movements: Extraocular movements intact.     Conjunctiva/sclera: Conjunctivae normal.     Pupils: Pupils are equal, round, and reactive to light.  Cardiovascular:     Rate and Rhythm: Normal rate and regular rhythm.     Pulses: Normal pulses.     Heart sounds: Normal heart sounds. No murmur heard. Pulmonary:     Effort: Pulmonary effort is normal. No respiratory distress.     Breath sounds: Normal breath sounds.  Abdominal:     Palpations: Abdomen is soft.     Tenderness: There is no abdominal tenderness.  Musculoskeletal:        General: No swelling.     Cervical back: Normal range of motion and neck supple.  Skin:    General: Skin is warm and dry.     Capillary Refill: Capillary  refill takes less than 2 seconds.  Neurological:     Mental Status: She is alert.  Psychiatric:        Mood and Affect: Mood normal.     ED Results / Procedures / Treatments   Labs (all labs ordered are listed, but only abnormal results are displayed) Labs Reviewed  BASIC METABOLIC PANEL - Abnormal; Notable for the following components:      Result Value   Potassium 3.2 (*)    Glucose, Bld 136 (*)    All other components within normal limits  CBC WITH DIFFERENTIAL/PLATELET    EKG None  Radiology DG Chest Portable 1 View Result Date: 04/26/2023 CLINICAL DATA:  Food impaction. Choking sensation when drinking since last night. EXAM: PORTABLE CHEST 1 VIEW COMPARISON:  Radiographs 03/21/2016 and 05/12/2012. Abdominal CT 06/24/2021. FINDINGS: 1121 hours. The heart size and mediastinal contours are stable with aortic atherosclerosis. No foreign bodies demonstrated. Minimal left basilar scarring appears improved from prior radiographs. There is no edema, confluent airspace disease, pleural effusion or pneumothorax. Mild degenerative changes in the spine without acute osseous abnormality. IMPRESSION: No evidence of acute cardiopulmonary process. Minimal left basilar scarring. Electronically Signed   By: Carey Bullocks M.D.   On: 04/26/2023 11:43    Procedures Procedures    Medications Ordered in ED Medications  glucagon (human recombinant) (GLUCAGEN) injection 1 mg (1 mg Intravenous Given 04/26/23 0954)  LORazepam (ATIVAN) injection 0.5 mg (0.5 mg Intravenous Given 04/26/23 0958)  sodium chloride 0.9 % bolus 500 mL (500 mLs Intravenous New Bag/Given 04/26/23 1000)  diazepam (VALIUM) injection 2.5 mg (2.5 mg Intravenous Given 04/26/23 1126)    ED Course/ Medical Decision Making/ A&P                                 Medical Decision Making Amount and/or Complexity of Data Reviewed Labs: ordered. Radiology: ordered.  Risk Prescription drug management.   Savannah Diaz is here with  difficulty swallowing.  Unremarkable vitals.  No fever.  History of anxiety esophageal web.  She ate chicken last night and felt some discomfort and now she has been having difficulty swallowing.  Has not really tried to force himself to drink since this happened.  She usually takes Ativan with this happen but she does not think she can tolerate it by mouth at this time.  Was brought here by family.  Overall differential diagnosis  could be dysphagia from anxiety or spasm which she states she has had in the past or could be food impaction.  Will give her IV fluids IV glucagon IV Ativan check basic labs and reevaluate.  She has not really attempted to drink anything by mouth so not really sure if she is clinically impacted or not.  Looking back at the EGD she had a couple years ago she did have an esophageal web.  Overall lab work for my review interpretation is unremarkable.  Chest x-ray is unremarkable.  She continues to be unable to drink any fluids despite Valium Ativan and glucagon.  I do have concern for food impaction may be esophageal web or stricture.  I have talked with Dr. Lavon Paganini with gastroenterology who does recommend ED to ED transfer to [facility] for their evaluation and possible EGD.  I have made ED team aware.  Patient stable for transfer POV which patient and family prefer.  She is tolerating secretions well.  She is in no respiratory distress.  This chart was dictated using voice recognition software.  Despite best efforts to proofread,  errors can occur which can change the documentation meaning.         Final Clinical Impression(s) / ED Diagnoses Final diagnoses:  Dysphagia, unspecified type    Rx / DC Orders ED Discharge Orders     None         Virgina Norfolk, DO 04/26/23 1251

## 2023-04-26 NOTE — Discharge Instructions (Signed)
 Stay on liquid diet today and slowly advance to soft foods for 2 weeks

## 2023-04-26 NOTE — Progress Notes (Signed)
 [facility]: [facility] Service:  Clinic (12)  Provider:   Code Status:  Goals of Care:     04/26/2023    2:08 PM  Advanced Directives  Does Patient Have a Medical Advance Directive? Yes  Type of Estate agent of [place];Living will  Copy of Healthcare Power of Attorney in Chart? No - copy requested     Chief Complaint  Patient presents with   Medical Management of Chronic Issues    Patient is here for a follow up and ear cleaning she has some concerns     HPI: Patient is a 89 y.o. female seen today for an acute visit for Ear cleaning  Past Medical History:  Diagnosis Date   Anxiety    Arthritis    "qwhere; but it doesn't hurt" (05/12/2012)   Aspiration pneumonia (HCC) 03/21/2016   Cataract    left   Compression fracture of first lumbar vertebra (HCC) 07/2016   superior endplate   Depression    Dysphagia    "have had it for 7 yr; got worse 4 days ago" (05/12/2012)   Dysphagia, pharyngoesophageal phase - suspected 05/10/2012   GERD (gastroesophageal reflux disease)    Hypertension    Insomnia    Lumbago with sciatica, right side    Migraines    "menopause cured them" (05/12/2012)   Osteoporosis    Stage 3 chronic kidney disease (HCC)    Uterine prolapse     Past Surgical History:  Procedure Laterality Date   CATARACT EXTRACTION W/ INTRAOCULAR LENS IMPLANT Left ? 2010   ESOPHAGEAL MANOMETRY N/A 11/08/2018   Procedure: ESOPHAGEAL MANOMETRY (EM);  Surgeon: Iva Boop, MD;  Location: [facility];  Service: Endoscopy;  Laterality: N/A;   ESOPHAGOGASTRODUODENOSCOPY N/A 05/10/2012   Procedure: ESOPHAGOGASTRODUODENOSCOPY (EGD);  Surgeon: Beverley Fiedler, MD;  Location: [facility];  Service: Gastroenterology;  Laterality: N/A;   TONSILLECTOMY  1940's    Allergies  Allergen Reactions   Lactose Intolerance (Gi) Other (See Comments)    Gi tract issues   Lactulose Other (See Comments)    Gi tract issues   Lactose     Other  Reaction(s): GI   Sulfa Antibiotics Hives    Childhood allergy     No facility-administered encounter medications on file as of 04/22/2023.   Outpatient Encounter Medications as of 04/22/2023  Medication Sig   amLODipine (NORVASC) 10 MG tablet TAKE ONE TABLET BY MOUTH DAILY   atenolol (TENORMIN) 50 MG tablet TAKE ONE TABLET BY MOUTH DAILY   Cholecalciferol (VITAMIN D3) 25 MCG (1000 UT) CAPS Take by mouth daily.   famotidine (PEPCID) 40 MG tablet TAKE ONE TABLET BY MOUTH TWICE DAILY   LORazepam (ATIVAN) 0.5 MG tablet TAKE ONE TABLET BY MOUTH TWICE DAILY AS NEEDED FOR ANXIETY   melatonin 3 MG TABS tablet Take 3 mg by mouth at bedtime as needed.   mirtazapine (REMERON) 30 MG tablet Take 1 tablet (30 mg total) by mouth at bedtime.   predniSONE (DELTASONE) 1 MG tablet Take 5 mg by mouth daily with breakfast.   Psyllium (METAMUCIL 4 IN 1 FIBER) 25 % PACK 1 packet with 8 ounces of liquid as needed Orally Once a day for 30 day(s)   sertraline (ZOLOFT) 20 MG/ML concentrated solution TAKE ONE TEASPOONFUL (5ml) BY MOUTH ONCE DAILY   traZODone (DESYREL) 50 MG tablet TAKE ONE TABLET BY MOUTH AT BEDTIME   vitamin B-12 (CYANOCOBALAMIN) 1000 MCG tablet Take 1 tablet (1,000  mcg total) by mouth daily.    Review of Systems:  Review of Systems  Health Maintenance  Topic Date Due   COVID-19 Vaccine (10 - 2024-25 season) 01/20/2023   Medicare Annual Wellness (AWV)  03/08/2024   DTaP/Tdap/Td (2 - Td or Tdap) 12/19/2027   Pneumonia Vaccine 40+ Years old  Completed   INFLUENZA VACCINE  Completed   DEXA SCAN  Completed   Zoster Vaccines- Shingrix  Completed   HPV VACCINES  Aged Out    Physical Exam: Vitals:   04/22/23 1304  BP: 118/70  Pulse: 76  Resp: 17  Temp: 98 F (36.7 C)  TempSrc: Temporal  SpO2: 98%  Weight: 117 lb 11.2 oz (53.4 kg)  Height: 5' (1.524 m)   Body mass index is 22.99 kg/m. Physical Exam Right Ear with Wax Labs reviewed: Basic Metabolic Panel: Recent Labs     01/08/23 0806 02/02/23 0800 04/26/23 0948  NA 142 142 142  K 3.4* 3.7 3.2*  CL 105 105 105  CO2 30 30 27   GLUCOSE 94 85 136*  BUN 15 20 19   CREATININE 0.83 0.87 0.83  CALCIUM 8.8 9.2 9.3  MG  --  1.9  --   TSH 4.02  --   --    Liver Function Tests: Recent Labs    01/08/23 0806  AST 15  ALT 17  BILITOT 0.8  PROT 6.0*   No results for input(s): "LIPASE", "AMYLASE" in the last 8760 hours. No results for input(s): "AMMONIA" in the last 8760 hours. CBC: Recent Labs    01/08/23 0806 04/26/23 0948  WBC 6.2 7.9  NEUTROABS 3,553 5.9  HGB 12.7 13.3  HCT 38.8 39.5  MCV 94.6 91.0  PLT 216 252   Lipid Panel: Recent Labs    01/08/23 0806  CHOL 241*  HDL 58  LDLCALC 161*  TRIG 103  CHOLHDL 4.2   Lab Results  Component Value Date   HGBA1C 5.5 10/24/2021    Procedures since last visit: DG Chest Portable 1 View Result Date: 04/26/2023 CLINICAL DATA:  Food impaction. Choking sensation when drinking since last night. EXAM: PORTABLE CHEST 1 VIEW COMPARISON:  Radiographs 03/21/2016 and 05/12/2012. Abdominal CT 06/24/2021. FINDINGS: 1121 hours. The heart size and mediastinal contours are stable with aortic atherosclerosis. No foreign bodies demonstrated. Minimal left basilar scarring appears improved from prior radiographs. There is no edema, confluent airspace disease, pleural effusion or pneumothorax. Mild degenerative changes in the spine without acute osseous abnormality. IMPRESSION: No evidence of acute cardiopulmonary process. Minimal left basilar scarring. Electronically Signed   By: Carey Bullocks M.D.   On: 04/26/2023 11:43    Assessment/Plan 1. Impacted cerumen of right ear (Primary) Nurse Did the ear wash Unable to clear the wax Talked to the patient and [facility] who were in the facility They agreed to take out the piece     Labs/tests ordered:  * No order type specified * Next appt:  06/17/2023

## 2023-04-26 NOTE — Transfer of Care (Signed)
 Immediate Anesthesia Transfer of Care Note  Patient: Savannah Diaz  Procedure(s) Performed: ESOPHAGOGASTRODUODENOSCOPY (EGD) FOREIGN BODY REMOVAL  Patient Location: PACU  Anesthesia Type:General  Level of Consciousness: awake, alert , and oriented  Airway & Oxygen Therapy: Patient Spontanous Breathing and Patient connected to face mask oxygen  Post-op Assessment: Report given to RN and Post -op Vital signs reviewed and stable  Post vital signs: Reviewed and stable  Last Vitals:  Vitals Value Taken Time  BP 169/87 04/26/23 1521  Temp    Pulse 83 04/26/23 1524  Resp 15 04/26/23 1524  SpO2 98 % 04/26/23 1524  Vitals shown include unfiled device data.  Last Pain:  Vitals:   04/26/23 1406  TempSrc: Temporal  PainSc: 0-No pain         Complications: No notable events documented.

## 2023-04-26 NOTE — Op Note (Signed)
 [facility] Patient Name: Savannah Diaz Procedure Date : 04/26/2023 MRN: [id number] Attending MD: Napoleon Form , MD, [id number] Date of Birth: 1932/06/02 CSN: [id number] Age: 89 Admit Type: Outpatient Procedure:                Upper GI endoscopy Indications:              Food impaction in the esophagus Providers:                Napoleon Form, MD, Eliberto Ivory, RN, Harrington Challenger, Technician Referring MD:              Medicines:                General Anesthesia Complications:            No immediate complications. Estimated Blood Loss:     Estimated blood loss was minimal. Procedure:                Pre-Anesthesia Assessment:                           - Prior to the procedure, a History and Physical                            was performed, and patient medications and                            allergies were reviewed. The patient's tolerance of                            previous anesthesia was also reviewed. The risks                            and benefits of the procedure and the sedation                            options and risks were discussed with the patient.                            All questions were answered, and informed consent                            was obtained. Prior Anticoagulants: The patient has                            taken no anticoagulant or antiplatelet agents. ASA                            Grade Assessment: III - A patient with severe                            systemic disease. After reviewing the risks and  benefits, the patient was deemed in satisfactory                            condition to undergo the procedure.                           After obtaining informed consent, the endoscope was                            passed under direct vision. Throughout the                            procedure, the patient's blood pressure, pulse, and                            oxygen  saturations were monitored continuously. The                            [id number]-H190 ([id number]) Olympus endoscope was introduced                            through the mouth, and advanced to the second part                            of duodenum. The upper GI endoscopy was technically                            difficult and complex due to narrowing, stenosis                            and the patient's body habitus. Scope In: Scope Out: Findings:      Large food bolus was found in the proximal esophagus. Due to prolonged       time since food impaction ~24 hours, family was reluctant to proceed       with EGD for disimpaction, it was challenging to disimpact. Food bolus       was breaking apart, able to remove piecemeal. Removal was accomplished       with a suction cap, Raptor grasping device and Roth net.      A few benign-appearing, intrinsic moderate (circumferential scarring or       stenosis; an endoscope may pass) stenoses were found 25 to 30 cm from       the incisors. The stenoses were traversed. Prominent osteophyte causing       extrinsic compression in the proximal esophagus      Mildly severe esophagitis with no bleeding was found in the proximal       esophagus at site of food impaction.      No gross lesions were noted in the stomach.      The examined duodenum was normal.      The cardia and gastric fundus were normal on retroflexion. Impression:               - Food in the proximal esophagus. Removal was  successful.                           - Benign-appearing esophageal stenoses.                           - Mildly severe esophagitis with no bleeding.                           - No gross lesions in the stomach.                           - Normal examined duodenum. Recommendation:           - Patient has a contact number available for                            emergencies. The signs and symptoms of potential                            delayed  complications were discussed with the                            patient. Return to normal activities tomorrow.                            Written discharge instructions were provided to the                            patient.                           - Mechanical soft diet.                           - Continue present medications.                           - Follow an antireflux regimen.                           - Use Protonix (pantoprazole) 20 mg PO daily.                           - Ok to DC home from GI standpoint Procedure Code(s):        --- Professional ---                           [phone], Esophagogastroduodenoscopy, flexible,                            transoral; with removal of foreign body(s) Diagnosis Code(s):        --- Professional ---                           N82.956O, Food in esophagus causing other injury,  initial encounter                           K22.2, Esophageal obstruction                           K20.90, Esophagitis, unspecified without bleeding                           T18.108A, Unspecified foreign body in esophagus                            causing other injury, initial encounter CPT copyright 2022 American Medical Association. All rights reserved. The codes documented in this report are preliminary and upon coder review may  be revised to meet current compliance requirements. Napoleon Form, MD 04/26/2023 3:21:54 PM This report has been signed electronically. Number of Addenda: 0

## 2023-04-26 NOTE — Anesthesia Postprocedure Evaluation (Signed)
 Anesthesia Post Note  Patient: Savannah Diaz  Procedure(s) Performed: ESOPHAGOGASTRODUODENOSCOPY (EGD) FOREIGN BODY REMOVAL     Patient location during evaluation: PACU Anesthesia Type: General Level of consciousness: awake and alert Pain management: pain level controlled Vital Signs Assessment: post-procedure vital signs reviewed and stable Respiratory status: spontaneous breathing, nonlabored ventilation, respiratory function stable and patient connected to nasal cannula oxygen Cardiovascular status: blood pressure returned to baseline and stable Postop Assessment: no apparent nausea or vomiting Anesthetic complications: no  No notable events documented.  Last Vitals:  Vitals:   04/26/23 1545 04/26/23 1552  BP: (!) 146/71 (!) 144/68  Pulse: 83 85  Resp: 19 11  Temp:  36.6 C  SpO2: 95% 94%    Last Pain:  Vitals:   04/26/23 1552  TempSrc:   PainSc: 0-No pain                 Shelton Silvas

## 2023-04-26 NOTE — Anesthesia Procedure Notes (Signed)
 Procedure Name: Intubation Date/Time: 04/26/2023 2:28 PM  Performed by: Garfield Cornea, CRNAPre-anesthesia Checklist: Patient identified, Emergency Drugs available, Suction available and Patient being monitored Patient Re-evaluated:Patient Re-evaluated prior to induction Oxygen Delivery Method: Circle System Utilized Preoxygenation: Pre-oxygenation with 100% oxygen Induction Type: IV induction, Rapid sequence and Cricoid Pressure applied Laryngoscope Size: Mac and 3 Grade View: Grade II Tube type: Oral Tube size: 7.0 mm Number of attempts: 1 Airway Equipment and Method: Stylet Placement Confirmation: ETT inserted through vocal cords under direct vision, positive ETCO2 and breath sounds checked- equal and bilateral Secured at: 22 cm Tube secured with: Tape Dental Injury: Teeth and Oropharynx as per pre-operative assessment  Comments: Grade IIb view with Mac 3

## 2023-04-26 NOTE — ED Triage Notes (Signed)
 States has "web" in throat. Known issue for her. States felt like food got stuck in throat last night which caused sore throat. Denies SHOB.

## 2023-04-26 NOTE — Anesthesia Preprocedure Evaluation (Addendum)
 Anesthesia Evaluation  Patient identified by MRN, date of birth, ID band Patient awake    Reviewed: Allergy & Precautions, NPO status , Patient's Chart, lab work & pertinent test results, reviewed documented beta blocker date and time   Airway Mallampati: I  TM Distance: <3 FB Neck ROM: Full    Dental  (+) Partial Lower, Dental Advisory Given, Teeth Intact   Pulmonary    breath sounds clear to auscultation       Cardiovascular hypertension, Pt. on medications and Pt. on home beta blockers  Rhythm:Regular Rate:Normal     Neuro/Psych  Headaches PSYCHIATRIC DISORDERS Anxiety Depression       GI/Hepatic Neg liver ROS,GERD  Medicated,,  Endo/Other    Renal/GU Renal InsufficiencyRenal disease     Musculoskeletal  (+) Arthritis ,    Abdominal   Peds  Hematology negative hematology ROS (+)   Anesthesia Other Findings   Reproductive/Obstetrics                             Anesthesia Physical Anesthesia Plan  ASA: 3  Anesthesia Plan: General   Post-op Pain Management: Minimal or no pain anticipated   Induction: Intravenous  PONV Risk Score and Plan: 4 or greater and Ondansetron and Treatment may vary due to age or medical condition  Airway Management Planned: Oral ETT  Additional Equipment: None  Intra-op Plan:   Post-operative Plan: Extubation in OR  Informed Consent: I have reviewed the patients History and Physical, chart, labs and discussed the procedure including the risks, benefits and alternatives for the proposed anesthesia with the patient or authorized representative who has indicated his/her understanding and acceptance.     Dental advisory given  Plan Discussed with: CRNA  Anesthesia Plan Comments:        Anesthesia Quick Evaluation

## 2023-04-26 NOTE — ED Notes (Signed)
 To ENDO now from lobby on arrival from [facility]

## 2023-04-26 NOTE — ED Notes (Signed)
Pt transported to endo.  

## 2023-04-26 NOTE — H&P (Signed)
 [facility] History and Physical   Primary Care Physician:  [name], MD   Reason for Procedure:   Food impaction  Plan:    EGD with possible intervention     HPI: Savannah Diaz is a 89 y.o. female here for EGD for possible food impaction, she was eating chicken last night and felt a piece get hung up back of her throat, she tried to cough it up forcefully and a piece of chicken came up but she has not been able to swallow anything since then.  She was initially even having difficulty swallowing her spit but in the past few hours she is not spitting, is able to swallow spit but cannot swallow any liquids, did a bedside test with a sip of water and patient was unable to swallow heard gurgling and she also had retrosternal discomfort. Plan to proceed with EGD for evaluation and disimpaction if needed The risks and benefits as well as alternatives of endoscopic procedure(s) have been discussed and reviewed. All questions answered. The patient agrees to proceed.    Past Medical History:  Diagnosis Date   Anxiety    Arthritis    "qwhere; but it doesn't hurt" (05/12/2012)   Aspiration pneumonia (HCC) 03/21/2016   Cataract    left   Compression fracture of first lumbar vertebra (HCC) 07/2016   superior endplate   Depression    Dysphagia    "have had it for 7 yr; got worse 4 days ago" (05/12/2012)   Dysphagia, pharyngoesophageal phase - suspected 05/10/2012   GERD (gastroesophageal reflux disease)    Hypertension    Insomnia    Lumbago with sciatica, right side    Migraines    "menopause cured them" (05/12/2012)   Osteoporosis    Stage 3 chronic kidney disease (HCC)    Uterine prolapse     Past Surgical History:  Procedure Laterality Date   CATARACT EXTRACTION W/ INTRAOCULAR LENS IMPLANT Left ? 2010   ESOPHAGEAL MANOMETRY N/A 11/08/2018   Procedure: ESOPHAGEAL MANOMETRY (EM);  Surgeon: Iva Boop, MD;  Location: [facility];  Service: Endoscopy;   Laterality: N/A;   ESOPHAGOGASTRODUODENOSCOPY N/A 05/10/2012   Procedure: ESOPHAGOGASTRODUODENOSCOPY (EGD);  Surgeon: Beverley Fiedler, MD;  Location: [facility];  Service: Gastroenterology;  Laterality: N/A;   TONSILLECTOMY  1940's    Prior to Admission medications   Medication Sig Start Date End Date Taking? Authorizing Provider  famotidine (PEPCID) 40 MG tablet TAKE ONE TABLET BY MOUTH TWICE DAILY 02/12/23  Yes [name], MD  LORazepam (ATIVAN) 0.5 MG tablet TAKE ONE TABLET BY MOUTH TWICE DAILY AS NEEDED FOR ANXIETY 12/23/22  Yes [name], MD  predniSONE (DELTASONE) 1 MG tablet Take 5 mg by mouth daily with breakfast.   Yes [provider]  amLODipine (NORVASC) 10 MG tablet TAKE ONE TABLET BY MOUTH DAILY 02/10/23   [name], MD  atenolol (TENORMIN) 50 MG tablet TAKE ONE TABLET BY MOUTH DAILY 02/09/23   [name], MD  Cholecalciferol (VITAMIN D3) 25 MCG (1000 UT) CAPS Take by mouth daily.    [provider]  melatonin 3 MG TABS tablet Take 3 mg by mouth at bedtime as needed.    [provider]  mirtazapine (REMERON) 30 MG tablet Take 1 tablet (30 mg total) by mouth at bedtime. 03/16/23   Fargo, Amy E, NP  Psyllium (METAMUCIL 4 IN 1 FIBER) 25 % PACK 1 packet with 8 ounces of liquid as needed Orally  Once a day for 30 day(s)    [provider]  sertraline (ZOLOFT) 20 MG/ML concentrated solution TAKE ONE TEASPOONFUL (5ml) BY MOUTH ONCE DAILY 03/25/23   [name], MD  traZODone (DESYREL) 50 MG tablet TAKE ONE TABLET BY MOUTH AT BEDTIME 01/29/23   [name], MD  vitamin B-12 (CYANOCOBALAMIN) 1000 MCG tablet Take 1 tablet (1,000 mcg total) by mouth daily. 03/28/21   Iva Boop, MD    Current Facility-Administered Medications  Medication Dose Route Frequency Provider Last Rate Last Admin   fentaNYL (SUBLIMAZE) 100 MCG/2ML injection            fentaNYL (SUBLIMAZE) injection 25-50 mcg  25-50 mcg Intravenous Q5 min PRN Shelton Silvas, MD        Allergies as of 04/26/2023 - Review Complete 04/26/2023  Allergen Reaction Noted   Lactose intolerance (gi) Other (See Comments) 05/14/2012   Lactulose Other (See Comments) 05/14/2012   Lactose  04/02/2022   Sulfa antibiotics Hives 05/10/2012    Family History  Problem Relation Age of Onset   Arthritis Mother    Hypertension Mother    Dementia Mother    Colon cancer Father    Stomach cancer Daughter    Esophageal cancer Neg Hx    Pancreatic cancer Neg Hx     Social History   Socioeconomic History   Marital status: Widowed    Spouse name: Not on file   Number of children: 2   Years of education: Not on file   Highest education level: Not on file  Occupational History   Occupation: Retired  Tobacco Use   Smoking status: Never   Smokeless tobacco: Never  Vaping Use   Vaping status: Never Used  Substance and Sexual Activity   Alcohol use: Not Currently    Comment: 05/12/2012 "have a drink hardly ever anymore"   Drug use: No   Sexual activity: Not Currently  Other Topics Concern   Not on file  Social History Narrative   Widowed, retired, 2 daughters husband was a Academic librarian professor at [employer]   Her daughters and son-in-law's are all philosophy professors to it Chrissie [place] and [place], daughter here at [employer] son-in-law at [employer]   Lives at [facility]   No Etoh/tobacco   Back exercises and walking are patient forms of exercising.     Social Drivers of Corporate investment banker Strain: Low Risk  (03/09/2023)   Overall Financial Resource Strain (CARDIA)    Difficulty of Paying Living Expenses: Not hard at all  Food Insecurity: No Food Insecurity (03/09/2023)   Hunger Vital Sign    Worried About Running Out of Food in the Last Year: Never true    Ran Out of Food in the Last Year: Never true  Transportation Needs: No Transportation Needs (03/09/2023)   PRAPARE - Administrator, Civil Service (Medical): No    Lack of  Transportation (Non-Medical): No  Physical Activity: Insufficiently Active (03/09/2023)   Exercise Vital Sign    Days of Exercise per Week: 3 days    Minutes of Exercise per Session: 40 min  Stress: No Stress Concern Present (03/09/2023)   [employer] of Occupational Health - Occupational Stress Questionnaire    Feeling of Stress : Only a little  Social Connections: Socially Isolated (03/09/2023)   Social Connection and Isolation Panel [NHANES]    Frequency of Communication with Friends and Family: More than three times a week  Frequency of Social Gatherings with Friends and Family: Twice a week    Attends Religious Services: Never    Database administrator or Organizations: No    Attends Banker Meetings: Never    Marital Status: Widowed  Intimate Partner Violence: Not At Risk (03/09/2023)   Humiliation, Afraid, Rape, and Kick questionnaire    Fear of Current or Ex-Partner: No    Emotionally Abused: No    Physically Abused: No    Sexually Abused: No    Review of Systems:  All other review of systems negative except as mentioned in the HPI.  Physical Exam: Vital signs in last 24 hours: Temp:  [97.8 F (36.6 C)] 97.8 F (36.6 C) (03/02 1406) Pulse Rate:  [86-109] 101 (03/02 1406) Resp:  [18] 18 (03/02 1406) BP: (130-153)/(92-105) 130/92 (03/02 1406) SpO2:  [94 %-99 %] 94 % (03/02 1406) Weight:  [53 kg] 53 kg (03/02 0922)   General:   Alert, NAD Lungs:  Clear .   Heart:  Regular rate and rhythm Abdomen:  Soft, nontender and nondistended. Neuro/Psych:  Alert and cooperative. Normal mood and affect. A and O x 3   K. Scherry Ran , MD [phone]

## 2023-04-27 ENCOUNTER — Encounter (HOSPITAL_COMMUNITY): Payer: Self-pay | Admitting: Gastroenterology

## 2023-04-28 DIAGNOSIS — M353 Polymyalgia rheumatica: Secondary | ICD-10-CM | POA: Diagnosis not present

## 2023-04-28 DIAGNOSIS — M6281 Muscle weakness (generalized): Secondary | ICD-10-CM | POA: Diagnosis not present

## 2023-04-29 ENCOUNTER — Ambulatory Visit (HOSPITAL_BASED_OUTPATIENT_CLINIC_OR_DEPARTMENT_OTHER): Payer: Medicare Other

## 2023-04-29 DIAGNOSIS — R011 Cardiac murmur, unspecified: Secondary | ICD-10-CM | POA: Diagnosis not present

## 2023-04-29 LAB — ECHOCARDIOGRAM COMPLETE
AR max vel: 1.51 cm2
AV Area VTI: 1.47 cm2
AV Area mean vel: 1.44 cm2
AV Mean grad: 8 mmHg
AV Peak grad: 14.7 mmHg
Ao pk vel: 1.92 m/s
Area-P 1/2: 2.87 cm2
S' Lateral: 1.28 cm

## 2023-04-30 DIAGNOSIS — M353 Polymyalgia rheumatica: Secondary | ICD-10-CM | POA: Diagnosis not present

## 2023-04-30 DIAGNOSIS — M6281 Muscle weakness (generalized): Secondary | ICD-10-CM | POA: Diagnosis not present

## 2023-05-02 ENCOUNTER — Encounter: Payer: Self-pay | Admitting: Internal Medicine

## 2023-05-03 ENCOUNTER — Encounter: Payer: Self-pay | Admitting: Internal Medicine

## 2023-05-04 ENCOUNTER — Encounter: Payer: Self-pay | Admitting: Internal Medicine

## 2023-05-04 ENCOUNTER — Non-Acute Institutional Stay: Admitting: Orthopedic Surgery

## 2023-05-04 ENCOUNTER — Encounter: Payer: Self-pay | Admitting: Orthopedic Surgery

## 2023-05-04 VITALS — BP 128/76 | HR 71 | Temp 98.9°F | Resp 18 | Ht 60.0 in | Wt 116.0 lb

## 2023-05-04 DIAGNOSIS — U071 COVID-19: Secondary | ICD-10-CM | POA: Diagnosis not present

## 2023-05-04 DIAGNOSIS — R1310 Dysphagia, unspecified: Secondary | ICD-10-CM | POA: Diagnosis not present

## 2023-05-04 NOTE — Progress Notes (Signed)
 Location:   [facility]   Place of Service:   [facility] Provider:  Octavia Heir, NP   Savannah Gammon, MD  Patient Care Team: Savannah Gammon, MD as PCP - General (Internal Medicine) [name], DO as Consulting Physician (Neurology)  Extended Emergency Contact Information Primary Emergency Contact: Savannah Diaz, [place] [address] [address] of [place] Home Phone: [phone] Mobile Phone: [phone] Relation: Daughter Secondary Emergency Contact: Fayette Pho Mobile Phone: [phone] Relation: Son  Code Status:  Full code Goals of care: Advanced Directive information    04/26/2023    2:08 PM  Advanced Directives  Does Patient Have a Medical Advance Directive? Yes  Type of Estate agent of [place];Living will  Copy of Healthcare Power of Attorney in Chart? No - copy requested     Chief Complaint  Patient presents with   Acute Visit    Patient complains of cough, fever, and SOB.     HPI:  Pt is a 89 y.o. female seen today for acute visit due to covid.   Fever, nasal congestion, productive cough, malaise began 03/08. This morning, [place] nurse confirms positive covid test. She reports having fever of 100 this morning. She reports improved cough and malaise since 03/08. She is also taking liquid guaifenesin for cough. Shortness of breath has resolved. She recently hospitalized 03/02 due to food impaction> resolved with EGD. She is eating soft foods at this time. We discussed paxlovid. She is not interested in taking at this time. Advised to contact [facility] if symptoms worsen or do not resolve.    Past Medical History:  Diagnosis Date   Anxiety    Arthritis    "qwhere; but it doesn't hurt" (05/12/2012)   Aspiration pneumonia (HCC) 03/21/2016   Cataract    left   Compression fracture of first lumbar vertebra (HCC) 07/2016   superior endplate   Depression    Dysphagia    "have had it for 7 yr; got worse  4 days ago" (05/12/2012)   Dysphagia, pharyngoesophageal phase - suspected 05/10/2012   GERD (gastroesophageal reflux disease)    Hypertension    Insomnia    Lumbago with sciatica, right side    Migraines    "menopause cured them" (05/12/2012)   Osteoporosis    Stage 3 chronic kidney disease (HCC)    Uterine prolapse    Past Surgical History:  Procedure Laterality Date   CATARACT EXTRACTION W/ INTRAOCULAR LENS IMPLANT Left ? 2010   ESOPHAGEAL MANOMETRY N/A 11/08/2018   Procedure: ESOPHAGEAL MANOMETRY (EM);  Surgeon: Iva Boop, MD;  Location: [facility];  Service: Endoscopy;  Laterality: N/A;   ESOPHAGOGASTRODUODENOSCOPY N/A 05/10/2012   Procedure: ESOPHAGOGASTRODUODENOSCOPY (EGD);  Surgeon: Beverley Fiedler, MD;  Location: [facility];  Service: Gastroenterology;  Laterality: N/A;   ESOPHAGOGASTRODUODENOSCOPY N/A 04/26/2023   Procedure: ESOPHAGOGASTRODUODENOSCOPY (EGD);  Surgeon: Napoleon Form, MD;  Location[facility] END[facility]COPY;  Service: Gastroenterology;  Laterality: N/A;   FOREIGN BODY REMOVAL  04/26/2023   Procedure: FOREIGN BODY REMOVAL;  Surgeon: Napoleon Form, MD;  Location: [facility] ENDOSCOPY;  Service: Gastroenterology;;   TONSILLECTOMY  1940's    Allergies  Allergen Reactions   Lactose Intolerance (Gi) Other (See Comments)    Gi tract issues   Lactulose Other (See Comments)    Gi tract issues   Lactose     Other Reaction(s): GI   Sulfa Antibiotics Hives    Childhood  allergy     Outpatient Encounter Medications as of 05/04/2023  Medication Sig   amLODipine (NORVASC) 10 MG tablet TAKE ONE TABLET BY MOUTH DAILY   atenolol (TENORMIN) 50 MG tablet TAKE ONE TABLET BY MOUTH DAILY   Cholecalciferol (VITAMIN D3) 25 MCG (1000 UT) CAPS Take by mouth daily.   famotidine (PEPCID) 40 MG tablet TAKE ONE TABLET BY MOUTH TWICE DAILY   LORazepam (ATIVAN) 0.5 MG tablet TAKE ONE TABLET BY MOUTH TWICE DAILY AS NEEDED FOR ANXIETY   melatonin 3 MG TABS tablet Take 3 mg by mouth at bedtime  as needed.   mirtazapine (REMERON) 30 MG tablet Take 1 tablet (30 mg total) by mouth at bedtime.   predniSONE (DELTASONE) 1 MG tablet Take 5 mg by mouth daily with breakfast.   Psyllium (METAMUCIL 4 IN 1 FIBER) 25 % PACK 1 packet with 8 ounces of liquid as needed Orally Once a day for 30 day(s)   sertraline (ZOLOFT) 20 MG/ML concentrated solution TAKE ONE TEASPOONFUL (5ml) BY MOUTH ONCE DAILY   traZODone (DESYREL) 50 MG tablet TAKE ONE TABLET BY MOUTH AT BEDTIME   vitamin B-12 (CYANOCOBALAMIN) 1000 MCG tablet Take 1 tablet (1,000 mcg total) by mouth daily.   No facility-administered encounter medications on file as of 05/04/2023.    Review of Systems  Constitutional:  Positive for fatigue and fever. Negative for chills.  HENT:  Positive for rhinorrhea and trouble swallowing. Negative for sinus pain and sore throat.   Respiratory:  Positive for cough and shortness of breath. Negative for wheezing.   Cardiovascular:  Negative for chest pain and leg swelling.  Gastrointestinal:  Negative for diarrhea, nausea and vomiting.  Musculoskeletal:  Negative for gait problem and myalgias.  Neurological:  Negative for headaches.  Psychiatric/Behavioral:  Negative for dysphoric mood. The patient is not nervous/anxious.     Immunization History  Administered Date(s) Administered   Fluad Quad(high Dose 65+) 12/11/2021   Influenza Split 10/16/2017, 10/28/2019, 11/29/2020   Influenza Whole 12/31/2003, 12/29/2004, 12/27/2006, 12/12/2007, 12/03/2008   Influenza, High Dose Seasonal PF 11/12/2012, 11/06/2015, 12/12/2015, 11/14/2016, 12/10/2018, 11/23/2022   Influenza, Seasonal, Injecte, Preservative Fre 11/24/2009, 11/24/2010, 11/20/2013, 11/16/2014   Influenza-Unspecified 10/26/2011, 10/25/2013, 11/06/2015   Moderna Covid-19 Fall Seasonal Vaccine 68yrs & older 12/22/2021, 07/19/2022   Moderna Covid-19 Vaccine Bivalent Booster 67yrs & up 11/14/2020, 08/07/2021, 11/25/2022   Moderna Sars-Covid-2 Vaccination  02/28/2019, 03/28/2019, 01/09/2020, 06/25/2020   Pneumococcal Conjugate-13 08/08/2013   Pneumococcal Polysaccharide-23 11/13/2006, 11/14/2016   Td (Adult) 11/13/2006   Tdap 12/18/2017   Unspecified SARS-COV-2 Vaccination 11/23/2022   Zoster Recombinant(Shingrix) 06/05/2022, 06/09/2022, 11/18/2022   Zoster, Live 08/27/2010, 12/27/2018, 04/27/2019   Pertinent  Health Maintenance Due  Topic Date Due   INFLUENZA VACCINE  Completed   DEXA SCAN  Completed      01/14/2023   10:22 AM 02/11/2023   10:41 AM 03/09/2023    2:23 PM 03/09/2023    2:51 PM 04/08/2023    2:48 PM  Fall Risk  Falls in the past year? 0 0 0 0 0  Was there an injury with Fall? 0 0 0 0 0  Fall Risk Category Calculator 0 0 0 0 0  Patient at Risk for Falls Due to No Fall Risks No Fall Risks No Fall Risks History of fall(s);Impaired balance/gait No Fall Risks  Fall risk Follow up Falls evaluation completed Falls evaluation completed Falls evaluation completed;Education provided;Falls prevention discussed Falls evaluation completed;Education provided Falls evaluation completed   Functional Status Survey:  There were no vitals filed for this visit. There is no height or weight on file to calculate BMI. Physical Exam Vitals reviewed.  Constitutional:      General: She is not in acute distress. HENT:     Head: Normocephalic.     Nose: Rhinorrhea present.     Mouth/Throat:     Mouth: Mucous membranes are moist.  Eyes:     General:        Right eye: No discharge.        Left eye: No discharge.  Cardiovascular:     Rate and Rhythm: Normal rate and regular rhythm.     Pulses: Normal pulses.     Heart sounds: Normal heart sounds.  Pulmonary:     Effort: Pulmonary effort is normal. No respiratory distress.     Breath sounds: Normal breath sounds. No wheezing or rales.  Abdominal:     Palpations: Abdomen is soft.  Musculoskeletal:        General: Normal range of motion.     Cervical back: Neck supple.  Skin:     General: Skin is warm.     Capillary Refill: Capillary refill takes less than 2 seconds.  Neurological:     General: No focal deficit present.     Mental Status: She is alert and oriented to person, place, and time.     Motor: No weakness.     Gait: Gait normal.  Psychiatric:        Mood and Affect: Mood normal.     Labs reviewed: Recent Labs    01/08/23 0806 02/02/23 0800 04/26/23 0948  NA 142 142 142  K 3.4* 3.7 3.2*  CL 105 105 105  CO2 30 30 27   GLUCOSE 94 85 136*  BUN 15 20 19   CREATININE 0.83 0.87 0.83  CALCIUM 8.8 9.2 9.3  MG  --  1.9  --    Recent Labs    01/08/23 0806  AST 15  ALT 17  BILITOT 0.8  PROT 6.0*   Recent Labs    01/08/23 0806 04/26/23 0948  WBC 6.2 7.9  NEUTROABS 3,553 5.9  HGB 12.7 13.3  HCT 38.8 39.5  MCV 94.6 91.0  PLT 216 252   Lab Results  Component Value Date   TSH 4.02 01/08/2023   Lab Results  Component Value Date   HGBA1C 5.5 10/24/2021   Lab Results  Component Value Date   CHOL 241 (H) 01/08/2023   HDL 58 01/08/2023   LDLCALC 161 (H) 01/08/2023   TRIG 103 01/08/2023   CHOLHDL 4.2 01/08/2023    Significant Diagnostic Results in last 30 days:  ECHOCARDIOGRAM COMPLETE Result Date: 04/29/2023    ECHOCARDIOGRAM REPORT   Patient Name:   Savannah Diaz Date of Exam: 04/29/2023 Medical Rec #:  [id number]     Height:       60.0 in Accession #:    [id number]    Weight:       116.8 lb Date of Birth:  May 07, 1932    BSA:          1.485 m Patient Age:    89 years      BP:           110/60 mmHg Patient Gender: F             HR:           67 bpm. Exam Location:  Outpatient Procedure: 2D Echo, 3D Echo, Cardiac Doppler, Color Doppler  and Strain Analysis            (Both Spectral and Color Flow Doppler were utilized during            procedure). Indications:    R01.1 Murmur  History:        Patient has no prior history of Echocardiogram examinations.                 Risk Factors:Non-Smoker, Dyslipidemia and Hypertension.  Sonographer:     Jeryl Columbia RDCS Referring Phys: [id number] ANJALI L GUPTA IMPRESSIONS  1. Left ventricular ejection fraction, by estimation, is 60 to 65%. Left ventricular ejection fraction by 3D volume is 62 %. The left ventricle has normal function. The left ventricle has no regional wall motion abnormalities. There is mild asymmetric left ventricular hypertrophy of the inferior segment. Left ventricular diastolic parameters are consistent with Grade I diastolic dysfunction (impaired relaxation).  2. Right ventricular systolic function is normal. The right ventricular size is normal.  3. Left atrial size was mildly dilated.  4. The mitral valve is normal in structure. Trivial mitral valve regurgitation. No evidence of mitral stenosis.  5. The aortic valve is tricuspid. Aortic valve regurgitation is not visualized. Aortic valve sclerosis/calcification is present, without any evidence of aortic stenosis.  6. The inferior vena cava is normal in size with greater than 50% respiratory variability, suggesting right atrial pressure of 3 mmHg. FINDINGS  Left Ventricle: Left ventricular ejection fraction, by estimation, is 60 to 65%. Left ventricular ejection fraction by 3D volume is 62 %. The left ventricle has normal function. The left ventricle has no regional wall motion abnormalities. The global longitudinal strain is normal despite suboptimal segment tracking. The left ventricular internal cavity size was normal in size. There is mild asymmetric left ventricular hypertrophy of the inferior segment. Left ventricular diastolic parameters are consistent with Grade I diastolic dysfunction (impaired relaxation). Right Ventricle: The right ventricular size is normal. No increase in right ventricular wall thickness. Right ventricular systolic function is normal. Left Atrium: Left atrial size was mildly dilated. Right Atrium: Right atrial size was normal in size. Pericardium: There is no evidence of pericardial effusion. Presence of  epicardial fat layer. Mitral Valve: The mitral valve is normal in structure. Trivial mitral valve regurgitation. No evidence of mitral valve stenosis. Tricuspid Valve: The tricuspid valve is normal in structure. Tricuspid valve regurgitation is trivial. No evidence of tricuspid stenosis. Aortic Valve: The aortic valve is tricuspid. Aortic valve regurgitation is not visualized. Aortic valve sclerosis/calcification is present, without any evidence of aortic stenosis. Aortic valve mean gradient measures 8.0 mmHg. Aortic valve peak gradient measures 14.7 mmHg. Aortic valve area, by VTI measures 1.47 cm. Pulmonic Valve: The pulmonic valve was normal in structure. Pulmonic valve regurgitation is not visualized. No evidence of pulmonic stenosis. Aorta: The aortic root is normal in size and structure. Venous: The inferior vena cava is normal in size with greater than 50% respiratory variability, suggesting right atrial pressure of 3 mmHg. IAS/Shunts: No atrial level shunt detected by color flow Doppler. Additional Comments: 3D was performed not requiring image post processing on an independent workstation and was normal.  LEFT VENTRICLE PLAX 2D LVIDd:         3.15 cm         Diastology LVIDs:         1.28 cm         LV e' medial:    5.11 cm/s LV PW:  1.04 cm         LV E/e' medial:  22.7 LV IVS:        0.84 cm         LV e' lateral:   5.77 cm/s LVOT diam:     1.90 cm         LV E/e' lateral: 20.1 LV SV:         61 LV SV Index:   41              2D Longitudinal LVOT Area:     2.84 cm        Strain                                2D Strain GLS   22.0 %                                (A4C):                                2D Strain GLS   17.3 %                                (A3C):                                2D Strain GLS   11.2 %                                (A2C):                                2D Strain GLS   16.8 %                                Avg:                                 3D Volume EF                                 LV 3D EF:    Left                                             ventricul                                             ar                                             ejection  fraction                                             by 3D                                             volume is                                             62 %.                                 3D Volume EF:                                3D EF:        62 %                                LV EDV:       99 ml                                LV ESV:       37 ml                                LV SV:        62 ml RIGHT VENTRICLE RV Basal diam:  3.09 cm RV Mid diam:    2.54 cm RV S prime:     10.70 cm/s TAPSE (M-mode): 2.5 cm LEFT ATRIUM             Index        RIGHT ATRIUM           Index LA diam:        2.20 cm 1.48 cm/m   RA Area:     11.50 cm LA Vol (A2C):   40.1 ml 27.00 ml/m  RA Volume:   27.40 ml  18.45 ml/m LA Vol (A4C):   49.4 ml 33.26 ml/m LA Biplane Vol: 46.9 ml 31.57 ml/m  AORTIC VALVE AV Area (Vmax):    1.51 cm AV Area (Vmean):   1.44 cm AV Area (VTI):     1.47 cm AV Vmax:           192.00 cm/s AV Vmean:          128.000 cm/s AV VTI:            0.418 m AV Peak Grad:      14.7 mmHg AV Mean Grad:      8.0 mmHg LVOT Vmax:         102.00 cm/s LVOT Vmean:        64.800 cm/s LVOT VTI:          0.216 m LVOT/AV VTI ratio: 0.52  AORTA Ao Root diam: 2.70 cm Ao Asc  diam:  3.30 cm MITRAL VALVE MV Area (PHT): 2.87 cm     SHUNTS MV Decel Time: 264 msec     Systemic VTI:  0.22 m MV E velocity: 116.00 cm/s  Systemic Diam: 1.90 cm MV A velocity: 160.00 cm/s MV E/A ratio:  0.72 Kardie Tobb DO Electronically signed by Thomasene Ripple DO Signature Date/Time: 04/29/2023/4:35:33 PM    Final    DG Chest Portable 1 View Result Date: 04/26/2023 CLINICAL DATA:  Food impaction. Choking sensation when drinking since last night. EXAM: PORTABLE CHEST 1 VIEW COMPARISON:  Radiographs 03/21/2016 and  05/12/2012. Abdominal CT 06/24/2021. FINDINGS: 1121 hours. The heart size and mediastinal contours are stable with aortic atherosclerosis. No foreign bodies demonstrated. Minimal left basilar scarring appears improved from prior radiographs. There is no edema, confluent airspace disease, pleural effusion or pneumothorax. Mild degenerative changes in the spine without acute osseous abnormality. IMPRESSION: No evidence of acute cardiopulmonary process. Minimal left basilar scarring. Electronically Signed   By: Carey Bullocks M.D.   On: 04/26/2023 11:43    Assessment/Plan 1. COVID-19 (Primary) - symptoms: fever, nasal congestion, cough and malaise began - 03/10 covid test positive - lung sounds clear - refused paxlovid - recommend rest and fluids - if symptoms worsen or do not improve advised to contact [facility]  2. Dysphagia, unspecified type - 03/02 hospitalized with food impaction> resolved with EGD    Family/ staff Communication: plan discussed with patient and nurse  Labs/tests ordered:  none

## 2023-05-12 DIAGNOSIS — M353 Polymyalgia rheumatica: Secondary | ICD-10-CM | POA: Diagnosis not present

## 2023-05-12 DIAGNOSIS — M6281 Muscle weakness (generalized): Secondary | ICD-10-CM | POA: Diagnosis not present

## 2023-05-14 ENCOUNTER — Telehealth: Admitting: Adult Health

## 2023-05-14 ENCOUNTER — Encounter: Payer: Self-pay | Admitting: Internal Medicine

## 2023-05-14 ENCOUNTER — Encounter: Payer: Self-pay | Admitting: Adult Health

## 2023-05-14 VITALS — Temp 98.0°F | Ht 60.0 in | Wt 112.0 lb

## 2023-05-14 DIAGNOSIS — R0981 Nasal congestion: Secondary | ICD-10-CM

## 2023-05-14 DIAGNOSIS — M353 Polymyalgia rheumatica: Secondary | ICD-10-CM

## 2023-05-14 DIAGNOSIS — U071 COVID-19: Secondary | ICD-10-CM

## 2023-05-14 DIAGNOSIS — M6281 Muscle weakness (generalized): Secondary | ICD-10-CM | POA: Diagnosis not present

## 2023-05-14 MED ORDER — FLUTICASONE PROPIONATE 50 MCG/ACT NA SUSP: 1.0000 | Freq: Every day | NASAL | 6 refills | Status: DC | PRN

## 2023-05-14 NOTE — Telephone Encounter (Signed)
 Spoke with patient stated this morning that she felt a little asthmatic because she had Covid. Patient requesting an appointment with [name], MD but I let her know that she was not in the office today. Patient has made an appointment with Kenard Gower, NP as MyChart video at 1:40 pm.  Savannah Diaz- she is a patient of [name], MD and lives at [facility].   Message sent to [name], MD and Splendora C. Medina-Vargas, NP

## 2023-05-14 NOTE — Progress Notes (Signed)
 Virtual Visit via Video Note  I connected with [name] on 05/14/23 at  1:40 PM EDT by a video enabled telemedicine application and verified that I am speaking with the correct person using two identifiers.  Patient Location: Home Provider Location: Office/Clinic  I discussed the limitations, risks, security, and privacy concerns of performing an evaluation and management service by video and the availability of in person appointments. I also discussed with the patient that there may be a patient responsible charge related to this service. The patient expressed understanding and agreed to proceed.  Subjective: PCP: [name], MD  Chief Complaint  Patient presents with   Acute Visit    Patient feel a little asthmatic also concerns about covid.   HPI  Discussed the use of AI scribe software for clinical note transcription with the patient, who gave verbal consent to proceed.  History of Present Illness   Savannah Diaz is an 89 year old female with polymyalgia rheumatica who presents with symptoms following a COVID-19 infection.  She tested positive for COVID-19 on May 02, 2023, after a nurse conducted a test at her residence. She isolated in her apartment for five days and wore a mask for an additional five days. It has been nearly two weeks since the onset of her symptoms.  She experiences nasal congestion and phlegm production associated with coughing. She has been using saline nasal spray and guaifenesin, taking a tablespoon every four hours until two or three days ago. Her symptoms have improved, with today being better than previous days. Her apartment gets very hot, which may contribute to her nasal symptoms, and she wonders if tree pollen could be a factor.  She feels very tired and has been resting as advised by her physical therapist. She spent yesterday resting and notes that mornings are the worst for her symptoms. She has a history of asthma as a child but has no  current wheezing or shortness of breath. She can climb stairs without difficulty.  She is currently on prednisone 5 mg daily for polymyalgia rheumatica, having previously tapered down from 10 mg. She is concerned about the interaction of her medications with her nasal symptoms.      Had COVID-19 positive 2 weeks ago, feels tired, nasal congestion and used saline. Takes guaifenesin a tbsp Q 4 hours   ROS: Per HPI   Current Outpatient Medications:    amLODipine (NORVASC) 10 MG tablet, TAKE ONE TABLET BY MOUTH DAILY, Disp: 90 tablet, Rfl: 1   atenolol (TENORMIN) 50 MG tablet, TAKE ONE TABLET BY MOUTH DAILY, Disp: 90 tablet, Rfl: 1   Cholecalciferol (VITAMIN D3) 25 MCG (1000 UT) CAPS, Take by mouth daily., Disp: , Rfl:    famotidine (PEPCID) 40 MG tablet, TAKE ONE TABLET BY MOUTH TWICE DAILY, Disp: 180 tablet, Rfl: 3   LORazepam (ATIVAN) 0.5 MG tablet, TAKE ONE TABLET BY MOUTH TWICE DAILY AS NEEDED FOR ANXIETY, Disp: 60 tablet, Rfl: 5   melatonin 3 MG TABS tablet, Take 3 mg by mouth at bedtime as needed., Disp: , Rfl:    mirtazapine (REMERON) 30 MG tablet, Take 1 tablet (30 mg total) by mouth at bedtime., Disp: 30 tablet, Rfl: 5   predniSONE (DELTASONE) 1 MG tablet, Take 5 mg by mouth daily with breakfast., Disp: , Rfl:    Psyllium (METAMUCIL 4 IN 1 FIBER) 25 % PACK, 1 packet with 8 ounces of liquid as needed Orally Once a day for 30 day(s), Disp: ,  Rfl:    sertraline (ZOLOFT) 20 MG/ML concentrated solution, TAKE ONE TEASPOONFUL (5ml) BY MOUTH ONCE DAILY, Disp: 60 mL, Rfl: 5   traZODone (DESYREL) 50 MG tablet, TAKE ONE TABLET BY MOUTH AT BEDTIME, Disp: 90 tablet, Rfl: 1   vitamin B-12 (CYANOCOBALAMIN) 1000 MCG tablet, Take 1 tablet (1,000 mcg total) by mouth daily., Disp: , Rfl:   Observations/Objective: Today's Vitals   05/14/23 1410  Temp: 98 F (36.7 C)  Weight: 112 lb (50.8 kg)  Height: 5' (1.524 m)   Physical Exam  Assessment and Plan: Assessment and Plan    COVID-19   Symptoms  improved, no longer contagious. Fatigue may persist.   - Encourage rest and hydration.   - Advise to monitor symptoms and seek medical attention if she worsens.   - Promote a healthy diet and adequate fluid intake.   - Encourage light exercise as tolerated.    Nasal congestion   Likely related to COVID-19 or allergies. Flonase recommended for relief, safe with prednisone.   - Recommend Flonase nasal spray as needed.   Diamantina Monks Flonase prescription to [facility] for delivery.    Polymyalgia rheumatica   Stable on 5 mg prednisone.   - Continue prednisone at 5 mg daily.      Follow Up Instructions: as needed if symptoms persists    I discussed the assessment and treatment plan with the patient. The patient was provided an opportunity to ask questions, and all were answered. The patient agreed with the plan and demonstrated an understanding of the instructions.   The patient was advised to call back or seek an in-person evaluation if the symptoms worsen or if the condition fails to improve as anticipated.  The above assessment and management plan was discussed with the patient. The patient verbalized understanding of and has agreed to the management plan.   Kenard Gower, NP

## 2023-05-14 NOTE — Progress Notes (Signed)
   This service is provided via telemedicine  No vital signs collected/recorded due to the encounter was a telemedicine visit.   Location of patient (ex: home, work):  Home  Patient consents to a telephone visit:  Yes  Location of the provider (ex: office, home):  [facility]   Name of any referring provider:  [name], MD   Names of all persons participating in the telemedicine service and their role in the encounter:  Patient, Mihesha.W.CMA, Ella Bodo, NP  Time spent on call: 8 minutes spent on the phone with Medical Assistant.

## 2023-05-15 NOTE — Telephone Encounter (Signed)
 Noted.

## 2023-05-15 NOTE — Telephone Encounter (Signed)
 Had a video visit 05/14/23. FYI.

## 2023-05-18 DIAGNOSIS — M353 Polymyalgia rheumatica: Secondary | ICD-10-CM | POA: Diagnosis not present

## 2023-05-18 DIAGNOSIS — M6281 Muscle weakness (generalized): Secondary | ICD-10-CM | POA: Diagnosis not present

## 2023-05-20 ENCOUNTER — Ambulatory Visit: Admitting: Internal Medicine

## 2023-05-20 ENCOUNTER — Encounter: Payer: Self-pay | Admitting: Internal Medicine

## 2023-05-20 VITALS — BP 117/78 | HR 71 | Temp 97.2°F | Resp 18 | Wt 116.0 lb

## 2023-05-20 DIAGNOSIS — M353 Polymyalgia rheumatica: Secondary | ICD-10-CM | POA: Diagnosis not present

## 2023-05-20 DIAGNOSIS — R0981 Nasal congestion: Secondary | ICD-10-CM

## 2023-05-20 DIAGNOSIS — F330 Major depressive disorder, recurrent, mild: Secondary | ICD-10-CM

## 2023-05-20 DIAGNOSIS — M6281 Muscle weakness (generalized): Secondary | ICD-10-CM | POA: Diagnosis not present

## 2023-05-20 DIAGNOSIS — R1310 Dysphagia, unspecified: Secondary | ICD-10-CM | POA: Diagnosis not present

## 2023-05-20 MED ORDER — PANTOPRAZOLE SODIUM 40 MG PO PACK: 40.0000 mg | PACK | Freq: Every day | ORAL | 0 refills | Status: DC

## 2023-05-20 NOTE — Progress Notes (Signed)
 `  Location:     Place of Service:     Provider:   Code Status:  Goals of Care:     04/26/2023    2:08 PM  Advanced Directives  Does Patient Have a Medical Advance Directive? Yes  Type of Estate agent of [place];Living will  Copy of Healthcare Power of Attorney in Chart? No - copy requested     Chief Complaint  Patient presents with   Acute Visit    night sweats/fatigue    HPI: Patient is a 89 y.o. female seen today for an acute visit for Follow up  Lives in [facility] [place]  Discussed the use of AI scribe software for clinical note transcription with the patient, who gave verbal consent to proceed.  History of Present Illness    Food Impaction The patient, with a past medical history of esophageal stenosis, presents for follow-up after a recent episode of food impaction that led to an emergency room visit. During the visit, an endoscopy was performed to remove the impacted food,  04/26/2023  Covid Infection 05/04/2023  patient had  COVID-19 infection. The patient reports that her symptoms from COVID-19 were not as severe as anticipated, with a few bad nights but no significant cough or shortness of breath.  Anxiety  The patient also reports ongoing issues with sleep, often waking up every hour, and has been taking lorazepam to help with sleep. She also mentions experiencing irritability in the late afternoon and has been taking her lorazepam earlier than bedtime to help with this.  The patient is also on prednisone and has noticed an increase in night sweats recently.  The patient has difficulty swallowing pills and prefers a bland diet. She has been careful with her food choices since the food impaction episode, opting for softer foods like meatloaf and chicken thighs. She expresses dissatisfaction with the food provided at her current living facility and has been cooking for herself.      She has h/o PMR on Prednisone, Osteoporosis Reclast per  Rheumatology Past Medical History:  Diagnosis Date   Anxiety    Arthritis    "qwhere; but it doesn't hurt" (05/12/2012)   Aspiration pneumonia (HCC) 03/21/2016   Cataract    left   Compression fracture of first lumbar vertebra (HCC) 07/2016   superior endplate   Depression    Dysphagia    "have had it for 7 yr; got worse 4 days ago" (05/12/2012)   Dysphagia, pharyngoesophageal phase - suspected 05/10/2012   GERD (gastroesophageal reflux disease)    Hypertension    Insomnia    Lumbago with sciatica, right side    Migraines    "menopause cured them" (05/12/2012)   Osteoporosis    Stage 3 chronic kidney disease (HCC)    Uterine prolapse     Past Surgical History:  Procedure Laterality Date   CATARACT EXTRACTION W/ INTRAOCULAR LENS IMPLANT Left ? 2010   ESOPHAGEAL MANOMETRY N/A 11/08/2018   Procedure: ESOPHAGEAL MANOMETRY (EM);  Surgeon: Iva Boop, MD;  Location: [facility] ENDOSCOPY;  Service: Endoscopy;  Laterality: N/A;   ESOPHAGOGASTRODUODENOSCOPY N/A 05/10/2012   Procedure: ESOPHAGOGASTRODUODENOSCOPY (EGD);  Surgeon: Beverley Fiedler, MD;  Location: [facility];  Service: Gastroenterology;  Laterality: N/A;   ESOPHAGOGASTRODUODENOSCOPY N/A 04/26/2023   Procedure: ESOPHAGOGASTRODUODENOSCOPY (EGD);  Surgeon: Napoleon Form, MD;  Location: [facility] ENDOSCOPY;  Service: Gastroenterology;  Laterality: N/A;   FOREIGN BODY REMOVAL  04/26/2023   Procedure: FOREIGN BODY REMOVAL;  Surgeon: Napoleon Form,  MD;  Location: [facility] ENDOSCOPY;  Service: Gastroenterology;;   TONSILLECTOMY  1940's    Allergies  Allergen Reactions   Lactose Intolerance (Gi) Other (See Comments)    Gi tract issues   Lactulose Other (See Comments)    Gi tract issues   Lactose     Other Reaction(s): GI   Sulfa Antibiotics Hives    Childhood allergy     Outpatient Encounter Medications as of 05/20/2023  Medication Sig   amLODipine (NORVASC) 10 MG tablet TAKE ONE TABLET BY MOUTH DAILY   atenolol (TENORMIN) 50 MG  tablet TAKE ONE TABLET BY MOUTH DAILY   Cholecalciferol (VITAMIN D3) 25 MCG (1000 UT) CAPS Take by mouth daily.   famotidine (PEPCID) 40 MG tablet TAKE ONE TABLET BY MOUTH TWICE DAILY   fluticasone (FLONASE) 50 MCG/ACT nasal spray Place 1 spray into both nostrils daily as needed for allergies or rhinitis.   LORazepam (ATIVAN) 0.5 MG tablet TAKE ONE TABLET BY MOUTH TWICE DAILY AS NEEDED FOR ANXIETY   melatonin 3 MG TABS tablet Take 3 mg by mouth at bedtime as needed.   mirtazapine (REMERON) 30 MG tablet Take 1 tablet (30 mg total) by mouth at bedtime.   pantoprazole sodium (PROTONIX) 40 mg Take 40 mg by mouth daily.   predniSONE (DELTASONE) 1 MG tablet Take 5 mg by mouth daily with breakfast.   Psyllium (METAMUCIL 4 IN 1 FIBER) 25 % PACK 1 packet with 8 ounces of liquid as needed Orally Once a day for 30 day(s)   sertraline (ZOLOFT) 20 MG/ML concentrated solution TAKE ONE TEASPOONFUL (5ml) BY MOUTH ONCE DAILY   traZODone (DESYREL) 50 MG tablet TAKE ONE TABLET BY MOUTH AT BEDTIME   vitamin B-12 (CYANOCOBALAMIN) 1000 MCG tablet Take 1 tablet (1,000 mcg total) by mouth daily.   No facility-administered encounter medications on file as of 05/20/2023.    Review of Systems:  Review of Systems  Constitutional:  Negative for activity change and appetite change.  HENT: Negative.    Respiratory:  Negative for cough and shortness of breath.   Cardiovascular:  Negative for leg swelling.  Gastrointestinal:  Negative for constipation.  Genitourinary: Negative.   Musculoskeletal:  Negative for arthralgias, gait problem and myalgias.  Skin: Negative.   Neurological:  Negative for dizziness and weakness.  Psychiatric/Behavioral:  Positive for sleep disturbance. Negative for confusion and dysphoric mood. The patient is nervous/anxious.     Health Maintenance  Topic Date Due   COVID-19 Vaccine (10 - Moderna risk 2024-25 season) 05/26/2023   Medicare Annual Wellness (AWV)  03/08/2024   DTaP/Tdap/Td (2 -  Td or Tdap) 12/19/2027   Pneumonia Vaccine 57+ Years old  Completed   INFLUENZA VACCINE  Completed   DEXA SCAN  Completed   Zoster Vaccines- Shingrix  Completed   HPV VACCINES  Aged Out    Physical Exam: Vitals:   05/20/23 1302  BP: 117/78  Pulse: 71  Resp: 18  Temp: (!) 97.2 F (36.2 C)  SpO2: 97%  Weight: 116 lb (52.6 kg)   Body mass index is 22.65 kg/m. Physical Exam Vitals reviewed.  Constitutional:      Appearance: Normal appearance.  HENT:     Head: Normocephalic.     Nose: Nose normal.     Mouth/Throat:     Mouth: Mucous membranes are moist.     Pharynx: Oropharynx is clear.  Eyes:     Pupils: Pupils are equal, round, and reactive to light.  Cardiovascular:  Rate and Rhythm: Normal rate and regular rhythm.     Pulses: Normal pulses.     Heart sounds: Normal heart sounds. No murmur heard. Pulmonary:     Effort: Pulmonary effort is normal.     Breath sounds: Normal breath sounds.  Abdominal:     General: Abdomen is flat. Bowel sounds are normal.     Palpations: Abdomen is soft.  Musculoskeletal:        General: No swelling.     Cervical back: Neck supple.  Skin:    General: Skin is warm.  Neurological:     General: No focal deficit present.     Mental Status: She is alert and oriented to person, place, and time.  Psychiatric:        Mood and Affect: Mood normal.        Thought Content: Thought content normal.     Labs reviewed: Basic Metabolic Panel: Recent Labs    01/08/23 0806 02/02/23 0800 04/26/23 0948  NA 142 142 142  K 3.4* 3.7 3.2*  CL 105 105 105  CO2 30 30 27   GLUCOSE 94 85 136*  BUN 15 20 19   CREATININE 0.83 0.87 0.83  CALCIUM 8.8 9.2 9.3  MG  --  1.9  --   TSH 4.02  --   --    Liver Function Tests: Recent Labs    01/08/23 0806  AST 15  ALT 17  BILITOT 0.8  PROT 6.0*   No results for input(s): "LIPASE", "AMYLASE" in the last 8760 hours. No results for input(s): "AMMONIA" in the last 8760 hours. CBC: Recent Labs     01/08/23 0806 04/26/23 0948  WBC 6.2 7.9  NEUTROABS 3,553 5.9  HGB 12.7 13.3  HCT 38.8 39.5  MCV 94.6 91.0  PLT 216 252   Lipid Panel: Recent Labs    01/08/23 0806  CHOL 241*  HDL 58  LDLCALC 161*  TRIG 103  CHOLHDL 4.2   Lab Results  Component Value Date   HGBA1C 5.5 10/24/2021    Procedures since last visit: ECHOCARDIOGRAM COMPLETE Result Date: 04/29/2023    ECHOCARDIOGRAM REPORT   Patient Name:   Savannah Diaz Date of Exam: 04/29/2023 Medical Rec #:  [id number]     Height:       60.0 in Accession #:    [id number]    Weight:       116.8 lb Date of Birth:  [birth year]    BSA:          1.485 m Patient Age:    89 years      BP:           110/60 mmHg Patient Gender: F             HR:           67 bpm. Exam Location:  Outpatient Procedure: 2D Echo, 3D Echo, Cardiac Doppler, Color Doppler and Strain Analysis            (Both Spectral and Color Flow Doppler were utilized during            procedure). Indications:    R01.1 Murmur  History:        Patient has no prior history of Echocardiogram examinations.                 Risk Factors:Non-Smoker, Dyslipidemia and Hypertension.  Sonographer:    Jeryl Columbia RDCS Referring Phys: [id number] Mackensey Bolte L Lorianna Spadaccini IMPRESSIONS  1. Left ventricular ejection  fraction, by estimation, is 60 to 65%. Left ventricular ejection fraction by 3D volume is 62 %. The left ventricle has normal function. The left ventricle has no regional wall motion abnormalities. There is mild asymmetric left ventricular hypertrophy of the inferior segment. Left ventricular diastolic parameters are consistent with Grade I diastolic dysfunction (impaired relaxation).  2. Right ventricular systolic function is normal. The right ventricular size is normal.  3. Left atrial size was mildly dilated.  4. The mitral valve is normal in structure. Trivial mitral valve regurgitation. No evidence of mitral stenosis.  5. The aortic valve is tricuspid. Aortic valve regurgitation is not visualized. Aortic  valve sclerosis/calcification is present, without any evidence of aortic stenosis.  6. The inferior vena cava is normal in size with greater than 50% respiratory variability, suggesting right atrial pressure of 3 mmHg. FINDINGS  Left Ventricle: Left ventricular ejection fraction, by estimation, is 60 to 65%. Left ventricular ejection fraction by 3D volume is 62 %. The left ventricle has normal function. The left ventricle has no regional wall motion abnormalities. The global longitudinal strain is normal despite suboptimal segment tracking. The left ventricular internal cavity size was normal in size. There is mild asymmetric left ventricular hypertrophy of the inferior segment. Left ventricular diastolic parameters are consistent with Grade I diastolic dysfunction (impaired relaxation). Right Ventricle: The right ventricular size is normal. No increase in right ventricular wall thickness. Right ventricular systolic function is normal. Left Atrium: Left atrial size was mildly dilated. Right Atrium: Right atrial size was normal in size. Pericardium: There is no evidence of pericardial effusion. Presence of epicardial fat layer. Mitral Valve: The mitral valve is normal in structure. Trivial mitral valve regurgitation. No evidence of mitral valve stenosis. Tricuspid Valve: The tricuspid valve is normal in structure. Tricuspid valve regurgitation is trivial. No evidence of tricuspid stenosis. Aortic Valve: The aortic valve is tricuspid. Aortic valve regurgitation is not visualized. Aortic valve sclerosis/calcification is present, without any evidence of aortic stenosis. Aortic valve mean gradient measures 8.0 mmHg. Aortic valve peak gradient measures 14.7 mmHg. Aortic valve area, by VTI measures 1.47 cm. Pulmonic Valve: The pulmonic valve was normal in structure. Pulmonic valve regurgitation is not visualized. No evidence of pulmonic stenosis. Aorta: The aortic root is normal in size and structure. Venous: The inferior  vena cava is normal in size with greater than 50% respiratory variability, suggesting right atrial pressure of 3 mmHg. IAS/Shunts: No atrial level shunt detected by color flow Doppler. Additional Comments: 3D was performed not requiring image post processing on an independent workstation and was normal.  LEFT VENTRICLE PLAX 2D LVIDd:         3.15 cm         Diastology LVIDs:         1.28 cm         LV e' medial:    5.11 cm/s LV PW:         1.04 cm         LV E/e' medial:  22.7 LV IVS:        0.84 cm         LV e' lateral:   5.77 cm/s LVOT diam:     1.90 cm         LV E/e' lateral: 20.1 LV SV:         61 LV SV Index:   41              2D Longitudinal LVOT Area:  2.84 cm        Strain                                2D Strain GLS   22.0 %                                (A4C):                                2D Strain GLS   17.3 %                                (A3C):                                2D Strain GLS   11.2 %                                (A2C):                                2D Strain GLS   16.8 %                                Avg:                                 3D Volume EF                                LV 3D EF:    Left                                             ventricul                                             ar                                             ejection                                             fraction                                             by 3D  volume is                                             62 %.                                 3D Volume EF:                                3D EF:        62 %                                LV EDV:       99 ml                                LV ESV:       37 ml                                LV SV:        62 ml RIGHT VENTRICLE RV Basal diam:  3.09 cm RV Mid diam:    2.54 cm RV S prime:     10.70 cm/s TAPSE (M-mode): 2.5 cm LEFT ATRIUM             Index        RIGHT ATRIUM           Index LA  diam:        2.20 cm 1.48 cm/m   RA Area:     11.50 cm LA Vol (A2C):   40.1 ml 27.00 ml/m  RA Volume:   27.40 ml  18.45 ml/m LA Vol (A4C):   49.4 ml 33.26 ml/m LA Biplane Vol: 46.9 ml 31.57 ml/m  AORTIC VALVE AV Area (Vmax):    1.51 cm AV Area (Vmean):   1.44 cm AV Area (VTI):     1.47 cm AV Vmax:           192.00 cm/s AV Vmean:          128.000 cm/s AV VTI:            0.418 m AV Peak Grad:      14.7 mmHg AV Mean Grad:      8.0 mmHg LVOT Vmax:         102.00 cm/s LVOT Vmean:        64.800 cm/s LVOT VTI:          0.216 m LVOT/AV VTI ratio: 0.52  AORTA Ao Root diam: 2.70 cm Ao Asc diam:  3.30 cm MITRAL VALVE MV Area (PHT): 2.87 cm     SHUNTS MV Decel Time: 264 msec     Systemic VTI:  0.22 m MV E velocity: 116.00 cm/s  Systemic Diam: 1.90 cm MV A velocity: 160.00 cm/s MV E/A ratio:  0.72 Kardie Tobb DO Electronically signed by Thomasene Ripple DO Signature Date/Time: 04/29/2023/4:35:33 PM    Final    DG Chest Portable 1 View Result Date: 04/26/2023 CLINICAL DATA:  Food impaction. Choking sensation when drinking since last night.  EXAM: PORTABLE CHEST 1 VIEW COMPARISON:  Radiographs 03/21/2016 and 05/12/2012. Abdominal CT 06/24/2021. FINDINGS: 1121 hours. The heart size and mediastinal contours are stable with aortic atherosclerosis. No foreign bodies demonstrated. Minimal left basilar scarring appears improved from prior radiographs. There is no edema, confluent airspace disease, pleural effusion or pneumothorax. Mild degenerative changes in the spine without acute osseous abnormality. IMPRESSION: No evidence of acute cardiopulmonary process. Minimal left basilar scarring. Electronically Signed   By: Carey Bullocks M.D.   On: 04/26/2023 11:43    Assessment/Plan Assessment and Plan    Esophageal food impaction Esophageal stenosis and a web likely contributed to the impaction.  No further episodes since. Concern for recurrence discussed. - Continue mechanical soft diet. - Consult with Rob for dietary  guidance and ensure adequate caloric intake. - Prescribe Protonix liquid  with Pepcid in the morning for two months. - Monitor for signs of food impaction or dysphagia and seek medical attention if symptoms recur.  COVID-19 infection Contracted COVID-19 post-ER visit. Symptoms improved but not fully resolved. Vaccinated. Hospital exposure risk discussed. - Continue physical therapy to regain strength. - Monitor symptoms and report any worsening or new symptoms.  Prednisone-induced side effects Experiencing irritability and anxiety likely due to prednisone. Aware of potential long-term side effects. - Continue current prednisone dose. - Monitor for side effects and adjust treatment as necessary oer Rheumatology  Insomnia Difficulty sleeping, possibly exacerbated by prednisone. Lorazepam dosage adjustment discussed. - Adjust lorazepam prescription for up to three doses per day as needed. - Monitor sleep patterns and adjust medication as necessary.  Allergic rhinitis Nasal congestion possibly due to seasonal allergies. Flonase use discussed. - Use Flonase as needed, avoid overuse. - Monitor symptoms and adjust treatment as necessary.  Night sweats Intermittent night sweats, possibly related to COVID-19 or other conditions. - Monitor night sweats and report any changes or worsening of symptoms.  PMR (polymyalgia rheumatica) (HCC) (Primary) Flare of symptoms with tapering of prednisone to 2mg  daily. Increased inflammatory markers (CRP 18). Rheumatologist increased prednisone to 7.5mg  daily for 3 weeks, now on 5mg  daily. Age-related osteoporosis without current pathological fracture Reclast Per Rheumatology Depression  On Zoloft  Labs/tests ordered:  * No order type specified * Next appt:  06/17/2023

## 2023-05-20 NOTE — Patient Instructions (Signed)
 I am calling in Pantoprazole Which is stronger then Pepcid Mix the powder with liquid Take it every day in the morning for 3 months Can take extra ativan in the afternoon only if needed I will talk to Rob about coming to eval for your Diet

## 2023-05-24 ENCOUNTER — Encounter: Payer: Self-pay | Admitting: Internal Medicine

## 2023-05-25 DIAGNOSIS — M6281 Muscle weakness (generalized): Secondary | ICD-10-CM | POA: Diagnosis not present

## 2023-05-25 DIAGNOSIS — M353 Polymyalgia rheumatica: Secondary | ICD-10-CM | POA: Diagnosis not present

## 2023-05-25 NOTE — Telephone Encounter (Signed)
Message routed to PCP Gupta, Anjali L, MD  

## 2023-05-26 DIAGNOSIS — M353 Polymyalgia rheumatica: Secondary | ICD-10-CM | POA: Diagnosis not present

## 2023-05-26 DIAGNOSIS — M6281 Muscle weakness (generalized): Secondary | ICD-10-CM | POA: Diagnosis not present

## 2023-05-26 DIAGNOSIS — R1314 Dysphagia, pharyngoesophageal phase: Secondary | ICD-10-CM | POA: Diagnosis not present

## 2023-05-27 DIAGNOSIS — M353 Polymyalgia rheumatica: Secondary | ICD-10-CM | POA: Diagnosis not present

## 2023-05-27 DIAGNOSIS — R1314 Dysphagia, pharyngoesophageal phase: Secondary | ICD-10-CM | POA: Diagnosis not present

## 2023-05-27 DIAGNOSIS — M6281 Muscle weakness (generalized): Secondary | ICD-10-CM | POA: Diagnosis not present

## 2023-05-28 DIAGNOSIS — M6281 Muscle weakness (generalized): Secondary | ICD-10-CM | POA: Diagnosis not present

## 2023-05-28 DIAGNOSIS — R1314 Dysphagia, pharyngoesophageal phase: Secondary | ICD-10-CM | POA: Diagnosis not present

## 2023-05-28 DIAGNOSIS — M353 Polymyalgia rheumatica: Secondary | ICD-10-CM | POA: Diagnosis not present

## 2023-06-01 DIAGNOSIS — R1314 Dysphagia, pharyngoesophageal phase: Secondary | ICD-10-CM | POA: Diagnosis not present

## 2023-06-01 DIAGNOSIS — M6281 Muscle weakness (generalized): Secondary | ICD-10-CM | POA: Diagnosis not present

## 2023-06-01 DIAGNOSIS — M353 Polymyalgia rheumatica: Secondary | ICD-10-CM | POA: Diagnosis not present

## 2023-06-03 DIAGNOSIS — M6281 Muscle weakness (generalized): Secondary | ICD-10-CM | POA: Diagnosis not present

## 2023-06-03 DIAGNOSIS — M353 Polymyalgia rheumatica: Secondary | ICD-10-CM | POA: Diagnosis not present

## 2023-06-03 DIAGNOSIS — R1314 Dysphagia, pharyngoesophageal phase: Secondary | ICD-10-CM | POA: Diagnosis not present

## 2023-06-04 DIAGNOSIS — M353 Polymyalgia rheumatica: Secondary | ICD-10-CM | POA: Diagnosis not present

## 2023-06-04 DIAGNOSIS — R1314 Dysphagia, pharyngoesophageal phase: Secondary | ICD-10-CM | POA: Diagnosis not present

## 2023-06-04 DIAGNOSIS — M6281 Muscle weakness (generalized): Secondary | ICD-10-CM | POA: Diagnosis not present

## 2023-06-08 DIAGNOSIS — M353 Polymyalgia rheumatica: Secondary | ICD-10-CM | POA: Diagnosis not present

## 2023-06-08 DIAGNOSIS — Z79899 Other long term (current) drug therapy: Secondary | ICD-10-CM | POA: Diagnosis not present

## 2023-06-08 DIAGNOSIS — R1314 Dysphagia, pharyngoesophageal phase: Secondary | ICD-10-CM | POA: Diagnosis not present

## 2023-06-08 DIAGNOSIS — M6281 Muscle weakness (generalized): Secondary | ICD-10-CM | POA: Diagnosis not present

## 2023-06-08 DIAGNOSIS — R5383 Other fatigue: Secondary | ICD-10-CM | POA: Diagnosis not present

## 2023-06-09 DIAGNOSIS — M6281 Muscle weakness (generalized): Secondary | ICD-10-CM | POA: Diagnosis not present

## 2023-06-09 DIAGNOSIS — M353 Polymyalgia rheumatica: Secondary | ICD-10-CM | POA: Diagnosis not present

## 2023-06-09 DIAGNOSIS — R1314 Dysphagia, pharyngoesophageal phase: Secondary | ICD-10-CM | POA: Diagnosis not present

## 2023-06-10 DIAGNOSIS — M810 Age-related osteoporosis without current pathological fracture: Secondary | ICD-10-CM | POA: Diagnosis not present

## 2023-06-11 DIAGNOSIS — Z79899 Other long term (current) drug therapy: Secondary | ICD-10-CM | POA: Diagnosis not present

## 2023-06-11 DIAGNOSIS — M810 Age-related osteoporosis without current pathological fracture: Secondary | ICD-10-CM | POA: Diagnosis not present

## 2023-06-11 DIAGNOSIS — M353 Polymyalgia rheumatica: Secondary | ICD-10-CM | POA: Diagnosis not present

## 2023-06-11 DIAGNOSIS — M2560 Stiffness of unspecified joint, not elsewhere classified: Secondary | ICD-10-CM | POA: Diagnosis not present

## 2023-06-11 DIAGNOSIS — E569 Vitamin deficiency, unspecified: Secondary | ICD-10-CM | POA: Diagnosis not present

## 2023-06-11 DIAGNOSIS — E559 Vitamin D deficiency, unspecified: Secondary | ICD-10-CM | POA: Diagnosis not present

## 2023-06-11 DIAGNOSIS — R5383 Other fatigue: Secondary | ICD-10-CM | POA: Diagnosis not present

## 2023-06-11 DIAGNOSIS — Z6822 Body mass index (BMI) 22.0-22.9, adult: Secondary | ICD-10-CM | POA: Diagnosis not present

## 2023-06-15 DIAGNOSIS — M353 Polymyalgia rheumatica: Secondary | ICD-10-CM | POA: Diagnosis not present

## 2023-06-15 DIAGNOSIS — R1314 Dysphagia, pharyngoesophageal phase: Secondary | ICD-10-CM | POA: Diagnosis not present

## 2023-06-15 DIAGNOSIS — M6281 Muscle weakness (generalized): Secondary | ICD-10-CM | POA: Diagnosis not present

## 2023-06-16 DIAGNOSIS — M6281 Muscle weakness (generalized): Secondary | ICD-10-CM | POA: Diagnosis not present

## 2023-06-16 DIAGNOSIS — M353 Polymyalgia rheumatica: Secondary | ICD-10-CM | POA: Diagnosis not present

## 2023-06-16 DIAGNOSIS — R1314 Dysphagia, pharyngoesophageal phase: Secondary | ICD-10-CM | POA: Diagnosis not present

## 2023-06-17 ENCOUNTER — Encounter: Payer: Self-pay | Admitting: Internal Medicine

## 2023-06-17 ENCOUNTER — Non-Acute Institutional Stay: Payer: Medicare Other | Admitting: Internal Medicine

## 2023-06-17 VITALS — BP 121/77 | HR 65 | Temp 97.0°F | Resp 18 | Ht 60.0 in | Wt 115.2 lb

## 2023-06-17 DIAGNOSIS — M353 Polymyalgia rheumatica: Secondary | ICD-10-CM

## 2023-06-17 DIAGNOSIS — R1310 Dysphagia, unspecified: Secondary | ICD-10-CM

## 2023-06-17 DIAGNOSIS — M6281 Muscle weakness (generalized): Secondary | ICD-10-CM | POA: Diagnosis not present

## 2023-06-17 DIAGNOSIS — F330 Major depressive disorder, recurrent, mild: Secondary | ICD-10-CM | POA: Diagnosis not present

## 2023-06-17 DIAGNOSIS — M810 Age-related osteoporosis without current pathological fracture: Secondary | ICD-10-CM | POA: Diagnosis not present

## 2023-06-17 DIAGNOSIS — R1314 Dysphagia, pharyngoesophageal phase: Secondary | ICD-10-CM | POA: Diagnosis not present

## 2023-06-17 MED ORDER — PANTOPRAZOLE SODIUM 40 MG PO PACK: 40.0000 mg | PACK | Freq: Every day | ORAL | 3 refills | Status: DC

## 2023-06-18 DIAGNOSIS — M353 Polymyalgia rheumatica: Secondary | ICD-10-CM | POA: Diagnosis not present

## 2023-06-18 DIAGNOSIS — R1314 Dysphagia, pharyngoesophageal phase: Secondary | ICD-10-CM | POA: Diagnosis not present

## 2023-06-18 DIAGNOSIS — M6281 Muscle weakness (generalized): Secondary | ICD-10-CM | POA: Diagnosis not present

## 2023-06-18 NOTE — Progress Notes (Signed)
 Location:  [facility] of Service:  Clinic (12)  Provider:   Code Status:  Goals of Care:     06/17/2023    9:18 AM  Advanced Directives  Does Patient Have a Medical Advance Directive? Yes  Type of Estate agent of [place];Living will  Does patient want to make changes to medical advance directive? No - Patient declined  Copy of Healthcare Power of Attorney in Chart? No - copy requested     Chief Complaint  Patient presents with  . Medical Management of Chronic Issues    4 month follow up    HPI: Patient is a 89 y.o. female seen today for medical management of chronic diseases.   Lives in [facility] [place]  H/o Food Impaction on 04/26/2023 Needing EGD Working with ST One of ST recommended repeating MBS She wanted to discuss that today She is doing well with no issues right now Does not want to see GI right now either Covid Infection 05/04/2023  Recovered Anxiety  She has h/o PMR on Prednisone , Osteoporosis Reclast  per Rheumatology  .        Past Medical History:  Diagnosis Date  . Anxiety   . Arthritis    "qwhere; but it doesn't hurt" (05/12/2012)  . Aspiration pneumonia (HCC) 03/21/2016  . Cataract    left  . Compression fracture of first lumbar vertebra (HCC) 07/2016   superior endplate  . Depression   . Dysphagia    "have had it for 7 yr; got worse 4 days ago" (05/12/2012)  . Dysphagia, pharyngoesophageal phase - suspected 05/10/2012  . GERD (gastroesophageal reflux disease)   . Hypertension   . Insomnia   . Lumbago with sciatica, right side   . Migraines    "menopause cured them" (05/12/2012)  . Osteoporosis   . Stage 3 chronic kidney disease (HCC)   . Uterine prolapse     Past Surgical History:  Procedure Laterality Date  . CATARACT EXTRACTION W/ INTRAOCULAR LENS IMPLANT Left ? 2010  . ESOPHAGEAL MANOMETRY N/A 11/08/2018   Procedure: ESOPHAGEAL MANOMETRY (EM);  Surgeon: Kenney Peacemaker, MD;  Location: [facility];   Service: Endoscopy;  Laterality: N/A;  . ESOPHAGOGASTRODUODENOSCOPY N/A 05/10/2012   Procedure: ESOPHAGOGASTRODUODENOSCOPY (EGD);  Surgeon: Nannette Babe, MD;  Location: [facility];  Service: Gastroenterology;  Laterality: N/A;  . ESOPHAGOGASTRODUODENOSCOPY N/A 04/26/2023   Procedure: ESOPHAGOGASTRODUODENOSCOPY (EGD);  Surgeon: Nandigam, Kavitha V, MD;  Location: [facility] ENDOSCOPY;  Service: Gastroenterology;  Laterality: N/A;  . FOREIGN BODY REMOVAL  04/26/2023   Procedure: FOREIGN BODY REMOVAL;  Surgeon: Sergio Dandy, MD;  Location: [facility] ENDOSCOPY;  Service: Gastroenterology;;  . TONSILLECTOMY  1940's    Allergies  Allergen Reactions  . Lactose Intolerance (Gi) Other (See Comments)    Gi tract issues  . Lactulose Other (See Comments)    Gi tract issues  . Lactose     Other Reaction(s): GI  . Sulfa Antibiotics Hives    Childhood allergy     Outpatient Encounter Medications as of 06/17/2023  Medication Sig  . amLODipine  (NORVASC ) 10 MG tablet TAKE ONE TABLET BY MOUTH DAILY  . atenolol  (TENORMIN ) 50 MG tablet TAKE ONE TABLET BY MOUTH DAILY  . Cholecalciferol (VITAMIN D3) 25 MCG (1000 UT) CAPS Take by mouth daily.  . famotidine  (PEPCID ) 40 MG tablet TAKE ONE TABLET BY MOUTH TWICE DAILY  . LORazepam  (ATIVAN ) 0.5 MG tablet TAKE ONE TABLET BY MOUTH TWICE DAILY AS NEEDED FOR ANXIETY  .  melatonin 3 MG TABS tablet Take 3 mg by mouth at bedtime as needed.  . mirtazapine  (REMERON ) 30 MG tablet Take 1 tablet (30 mg total) by mouth at bedtime.  . predniSONE  (DELTASONE ) 1 MG tablet Take 5 mg by mouth daily with breakfast.  . Psyllium (METAMUCIL 4 IN 1 FIBER) 25 % PACK 1 packet with 8 ounces of liquid as needed Orally Once a day for 30 day(s)  . sertraline  (ZOLOFT ) 20 MG/ML concentrated solution TAKE ONE TEASPOONFUL (5ml) BY MOUTH ONCE DAILY  . traZODone  (DESYREL ) 50 MG tablet TAKE ONE TABLET BY MOUTH AT BEDTIME  . vitamin B-12 (CYANOCOBALAMIN ) 1000 MCG tablet Take 1 tablet (1,000 mcg total) by mouth  daily.  . [DISCONTINUED] pantoprazole  sodium (PROTONIX ) 40 mg Take 40 mg by mouth daily.  . fluticasone  (FLONASE ) 50 MCG/ACT nasal spray Place 1 spray into both nostrils daily as needed for allergies or rhinitis. (Patient not taking: Reported on 06/17/2023)  . pantoprazole  sodium (PROTONIX ) 40 mg Take 40 mg by mouth daily.   No facility-administered encounter medications on file as of 06/17/2023.    Review of Systems:  Review of Systems  Constitutional:  Negative for activity change and appetite change.  HENT:  Positive for trouble swallowing.   Respiratory:  Negative for cough and shortness of breath.   Cardiovascular:  Negative for leg swelling.  Gastrointestinal:  Negative for constipation.  Genitourinary: Negative.   Musculoskeletal:  Negative for arthralgias, gait problem and myalgias.  Skin: Negative.   Neurological:  Negative for dizziness and weakness.  Psychiatric/Behavioral:  Positive for dysphoric mood and sleep disturbance. Negative for confusion. The patient is nervous/anxious.     Health Maintenance  Topic Date Due  . COVID-19 Vaccine (10 - Moderna risk 2024-25 season) 10/26/2023 (Originally 05/26/2023)  . INFLUENZA VACCINE  09/25/2023  . Medicare Annual Wellness (AWV)  03/08/2024  . DTaP/Tdap/Td (2 - Td or Tdap) 12/19/2027  . Pneumonia Vaccine 41+ Years old  Completed  . DEXA SCAN  Completed  . Zoster Vaccines- Shingrix  Completed  . HPV VACCINES  Aged Out  . Meningococcal B Vaccine  Aged Out    Physical Exam: Vitals:   06/17/23 0918  BP: 121/77  Pulse: 65  Resp: 18  Temp: (!) 97 F (36.1 C)  SpO2: 94%  Weight: 115 lb 3.2 oz (52.3 kg)  Height: 5' (1.524 m)   Body mass index is 22.5 kg/m. Physical Exam Vitals reviewed.  Constitutional:      Appearance: Normal appearance.  HENT:     Head: Normocephalic.     Nose: Nose normal.     Mouth/Throat:     Mouth: Mucous membranes are moist.     Pharynx: Oropharynx is clear.  Eyes:     Pupils: Pupils are  equal, round, and reactive to light.  Cardiovascular:     Rate and Rhythm: Normal rate and regular rhythm.     Pulses: Normal pulses.     Heart sounds: Normal heart sounds. No murmur heard. Pulmonary:     Effort: Pulmonary effort is normal.     Breath sounds: Normal breath sounds.  Abdominal:     General: Abdomen is flat. Bowel sounds are normal.     Palpations: Abdomen is soft.  Musculoskeletal:        General: No swelling.     Cervical back: Neck supple.  Skin:    General: Skin is warm.  Neurological:     General: No focal deficit present.     Mental  Status: She is alert and oriented to person, place, and time.  Psychiatric:        Mood and Affect: Mood normal.        Thought Content: Thought content normal.    Labs reviewed: Basic Metabolic Panel: Recent Labs    01/08/23 0806 02/02/23 0800 04/26/23 0948  NA 142 142 142  K 3.4* 3.7 3.2*  CL 105 105 105  CO2 30 30 27   GLUCOSE 94 85 136*  BUN 15 20 19   CREATININE 0.83 0.87 0.83  CALCIUM  8.8 9.2 9.3  MG  --  1.9  --   TSH 4.02  --   --    Liver Function Tests: Recent Labs    01/08/23 0806  AST 15  ALT 17  BILITOT 0.8  PROT 6.0*   No results for input(s): "LIPASE", "AMYLASE" in the last 8760 hours. No results for input(s): "AMMONIA" in the last 8760 hours. CBC: Recent Labs    01/08/23 0806 04/26/23 0948  WBC 6.2 7.9  NEUTROABS 3,553 5.9  HGB 12.7 13.3  HCT 38.8 39.5  MCV 94.6 91.0  PLT 216 252   Lipid Panel: Recent Labs    01/08/23 0806  CHOL 241*  HDL 58  LDLCALC 161*  TRIG 103  CHOLHDL 4.2   Lab Results  Component Value Date   HGBA1C 5.5 10/24/2021    Procedures since last visit: No results found.  Assessment/Plan 1. Dysphagia, unspecified type (Primary) D/W ST here in [facility] We agreed that she does not need to repeat MBS right now She is doing well with her Swallowing Will Order if anything changes Continue Protonix  2. PMR (polymyalgia rheumatica) (HCC) Now on 5 mg of  Prednisone  Failed taper Recent Labs in Care everywhere were all in good limits  3. Mild episode of recurrent major depressive disorder (HCC) Does well with Ativan  and Remeron  and Zoloft   4. Age-related osteoporosis without current pathological fracture Reclast  Per Rheumatology  5 Insomnia Trazadone  Labs/tests ordered:  * No order type specified * Next appt:  09/16/2023

## 2023-06-22 DIAGNOSIS — M353 Polymyalgia rheumatica: Secondary | ICD-10-CM | POA: Diagnosis not present

## 2023-06-22 DIAGNOSIS — R1314 Dysphagia, pharyngoesophageal phase: Secondary | ICD-10-CM | POA: Diagnosis not present

## 2023-06-22 DIAGNOSIS — M6281 Muscle weakness (generalized): Secondary | ICD-10-CM | POA: Diagnosis not present

## 2023-06-23 DIAGNOSIS — M353 Polymyalgia rheumatica: Secondary | ICD-10-CM | POA: Diagnosis not present

## 2023-06-23 DIAGNOSIS — R1314 Dysphagia, pharyngoesophageal phase: Secondary | ICD-10-CM | POA: Diagnosis not present

## 2023-06-23 DIAGNOSIS — M6281 Muscle weakness (generalized): Secondary | ICD-10-CM | POA: Diagnosis not present

## 2023-06-24 DIAGNOSIS — M353 Polymyalgia rheumatica: Secondary | ICD-10-CM | POA: Diagnosis not present

## 2023-06-24 DIAGNOSIS — R1314 Dysphagia, pharyngoesophageal phase: Secondary | ICD-10-CM | POA: Diagnosis not present

## 2023-06-24 DIAGNOSIS — M6281 Muscle weakness (generalized): Secondary | ICD-10-CM | POA: Diagnosis not present

## 2023-06-25 ENCOUNTER — Other Ambulatory Visit: Payer: Self-pay | Admitting: Internal Medicine

## 2023-06-25 DIAGNOSIS — M6281 Muscle weakness (generalized): Secondary | ICD-10-CM | POA: Diagnosis not present

## 2023-06-25 DIAGNOSIS — M353 Polymyalgia rheumatica: Secondary | ICD-10-CM | POA: Diagnosis not present

## 2023-06-25 DIAGNOSIS — R1314 Dysphagia, pharyngoesophageal phase: Secondary | ICD-10-CM | POA: Diagnosis not present

## 2023-06-25 NOTE — Telephone Encounter (Signed)
 Copied from CRM [id number]. Topic: Clinical - Medication Refill >> Jun 25, 2023  9:40 AM Brynn Caras wrote: Most Recent Primary Care Visit:  Provider: Marguerite Shiley  Department: [facility]  Visit Type: [facility]ED [facility]IV[facility]  Date: 06/17/2023  Medication: LORazepam  (ATIVAN ) 0.5 MG tablet  Has the patient contacted their pharmacy? Yes (Agent: If no, request that the patient contact the pharmacy for the refill. If patient does not wish to contact the pharmacy document the reason why and proceed with request.) (Agent: If yes, when and what did the pharmacy advise?)  Is this the correct pharmacy for this prescription? Yes If no, delete pharmacy and type the correct one.  This is the patient's preferred pharmacy:   [facility] - [place], [place] - [address] [place] [address] Phone: [phone] Fax: [phone]   Has the prescription been filled recently? No  Is the patient out of the medication? Yes  Has the patient been seen for an appointment in the last year OR does the patient have an upcoming appointment? Yes  Can we respond through MyChart? No  Agent: Please be advised that Rx refills may take up to 3 business days. We ask that you follow-up with your pharmacy.

## 2023-06-26 MED ORDER — LORAZEPAM 0.5 MG PO TABS: 0.5000 mg | ORAL_TABLET | Freq: Two times a day (BID) | ORAL | 5 refills | Status: DC | PRN

## 2023-06-26 NOTE — Telephone Encounter (Signed)
 Patient has request refill on medication Lorazepam  0.5mg . Patient last refill dated 12/23/2022. Patient has non opioid contract on file dated 11/06/2021. Patient upcoming appointment has updated contract in appointment notes. Medication pend and sent to PCP Marguerite Shiley, MD

## 2023-06-29 DIAGNOSIS — M6281 Muscle weakness (generalized): Secondary | ICD-10-CM | POA: Diagnosis not present

## 2023-06-29 DIAGNOSIS — R1314 Dysphagia, pharyngoesophageal phase: Secondary | ICD-10-CM | POA: Diagnosis not present

## 2023-06-29 DIAGNOSIS — M353 Polymyalgia rheumatica: Secondary | ICD-10-CM | POA: Diagnosis not present

## 2023-06-30 DIAGNOSIS — M6281 Muscle weakness (generalized): Secondary | ICD-10-CM | POA: Diagnosis not present

## 2023-06-30 DIAGNOSIS — R1314 Dysphagia, pharyngoesophageal phase: Secondary | ICD-10-CM | POA: Diagnosis not present

## 2023-06-30 DIAGNOSIS — M353 Polymyalgia rheumatica: Secondary | ICD-10-CM | POA: Diagnosis not present

## 2023-07-01 DIAGNOSIS — M6281 Muscle weakness (generalized): Secondary | ICD-10-CM | POA: Diagnosis not present

## 2023-07-01 DIAGNOSIS — R1314 Dysphagia, pharyngoesophageal phase: Secondary | ICD-10-CM | POA: Diagnosis not present

## 2023-07-01 DIAGNOSIS — M353 Polymyalgia rheumatica: Secondary | ICD-10-CM | POA: Diagnosis not present

## 2023-07-02 DIAGNOSIS — M353 Polymyalgia rheumatica: Secondary | ICD-10-CM | POA: Diagnosis not present

## 2023-07-02 DIAGNOSIS — M6281 Muscle weakness (generalized): Secondary | ICD-10-CM | POA: Diagnosis not present

## 2023-07-02 DIAGNOSIS — R1314 Dysphagia, pharyngoesophageal phase: Secondary | ICD-10-CM | POA: Diagnosis not present

## 2023-07-05 ENCOUNTER — Encounter: Payer: Self-pay | Admitting: Internal Medicine

## 2023-07-06 DIAGNOSIS — R1314 Dysphagia, pharyngoesophageal phase: Secondary | ICD-10-CM | POA: Diagnosis not present

## 2023-07-06 DIAGNOSIS — M6281 Muscle weakness (generalized): Secondary | ICD-10-CM | POA: Diagnosis not present

## 2023-07-06 DIAGNOSIS — M353 Polymyalgia rheumatica: Secondary | ICD-10-CM | POA: Diagnosis not present

## 2023-07-06 MED ORDER — SERTRALINE HCL 20 MG/ML PO CONC: ORAL | 5 refills | Status: DC

## 2023-07-06 NOTE — Telephone Encounter (Signed)
Pended Rx and sent to Dr. Gupta for approval due to HIGH ALERT Warning.  °

## 2023-07-06 NOTE — Telephone Encounter (Signed)
 Patient requested refill

## 2023-07-07 DIAGNOSIS — R1314 Dysphagia, pharyngoesophageal phase: Secondary | ICD-10-CM | POA: Diagnosis not present

## 2023-07-07 DIAGNOSIS — M6281 Muscle weakness (generalized): Secondary | ICD-10-CM | POA: Diagnosis not present

## 2023-07-07 DIAGNOSIS — M353 Polymyalgia rheumatica: Secondary | ICD-10-CM | POA: Diagnosis not present

## 2023-07-08 DIAGNOSIS — M353 Polymyalgia rheumatica: Secondary | ICD-10-CM | POA: Diagnosis not present

## 2023-07-08 DIAGNOSIS — R1314 Dysphagia, pharyngoesophageal phase: Secondary | ICD-10-CM | POA: Diagnosis not present

## 2023-07-08 DIAGNOSIS — M6281 Muscle weakness (generalized): Secondary | ICD-10-CM | POA: Diagnosis not present

## 2023-07-09 DIAGNOSIS — M6281 Muscle weakness (generalized): Secondary | ICD-10-CM | POA: Diagnosis not present

## 2023-07-09 DIAGNOSIS — M353 Polymyalgia rheumatica: Secondary | ICD-10-CM | POA: Diagnosis not present

## 2023-07-09 DIAGNOSIS — R1314 Dysphagia, pharyngoesophageal phase: Secondary | ICD-10-CM | POA: Diagnosis not present

## 2023-07-13 DIAGNOSIS — R1314 Dysphagia, pharyngoesophageal phase: Secondary | ICD-10-CM | POA: Diagnosis not present

## 2023-07-13 DIAGNOSIS — M353 Polymyalgia rheumatica: Secondary | ICD-10-CM | POA: Diagnosis not present

## 2023-07-13 DIAGNOSIS — M6281 Muscle weakness (generalized): Secondary | ICD-10-CM | POA: Diagnosis not present

## 2023-07-14 DIAGNOSIS — M353 Polymyalgia rheumatica: Secondary | ICD-10-CM | POA: Diagnosis not present

## 2023-07-14 DIAGNOSIS — M6281 Muscle weakness (generalized): Secondary | ICD-10-CM | POA: Diagnosis not present

## 2023-07-14 DIAGNOSIS — R1314 Dysphagia, pharyngoesophageal phase: Secondary | ICD-10-CM | POA: Diagnosis not present

## 2023-07-14 DIAGNOSIS — L570 Actinic keratosis: Secondary | ICD-10-CM | POA: Diagnosis not present

## 2023-07-14 DIAGNOSIS — L821 Other seborrheic keratosis: Secondary | ICD-10-CM | POA: Diagnosis not present

## 2023-07-14 DIAGNOSIS — D1801 Hemangioma of skin and subcutaneous tissue: Secondary | ICD-10-CM | POA: Diagnosis not present

## 2023-07-14 DIAGNOSIS — L820 Inflamed seborrheic keratosis: Secondary | ICD-10-CM | POA: Diagnosis not present

## 2023-07-14 DIAGNOSIS — L720 Epidermal cyst: Secondary | ICD-10-CM | POA: Diagnosis not present

## 2023-07-14 DIAGNOSIS — L814 Other melanin hyperpigmentation: Secondary | ICD-10-CM | POA: Diagnosis not present

## 2023-07-15 DIAGNOSIS — R1314 Dysphagia, pharyngoesophageal phase: Secondary | ICD-10-CM | POA: Diagnosis not present

## 2023-07-15 DIAGNOSIS — M6281 Muscle weakness (generalized): Secondary | ICD-10-CM | POA: Diagnosis not present

## 2023-07-15 DIAGNOSIS — M353 Polymyalgia rheumatica: Secondary | ICD-10-CM | POA: Diagnosis not present

## 2023-07-16 DIAGNOSIS — M6281 Muscle weakness (generalized): Secondary | ICD-10-CM | POA: Diagnosis not present

## 2023-07-16 DIAGNOSIS — M353 Polymyalgia rheumatica: Secondary | ICD-10-CM | POA: Diagnosis not present

## 2023-07-16 DIAGNOSIS — R1314 Dysphagia, pharyngoesophageal phase: Secondary | ICD-10-CM | POA: Diagnosis not present

## 2023-07-21 DIAGNOSIS — M353 Polymyalgia rheumatica: Secondary | ICD-10-CM | POA: Diagnosis not present

## 2023-07-21 DIAGNOSIS — M6281 Muscle weakness (generalized): Secondary | ICD-10-CM | POA: Diagnosis not present

## 2023-07-21 DIAGNOSIS — R1314 Dysphagia, pharyngoesophageal phase: Secondary | ICD-10-CM | POA: Diagnosis not present

## 2023-07-22 DIAGNOSIS — R1314 Dysphagia, pharyngoesophageal phase: Secondary | ICD-10-CM | POA: Diagnosis not present

## 2023-07-22 DIAGNOSIS — M353 Polymyalgia rheumatica: Secondary | ICD-10-CM | POA: Diagnosis not present

## 2023-07-22 DIAGNOSIS — M6281 Muscle weakness (generalized): Secondary | ICD-10-CM | POA: Diagnosis not present

## 2023-07-23 DIAGNOSIS — M6281 Muscle weakness (generalized): Secondary | ICD-10-CM | POA: Diagnosis not present

## 2023-07-23 DIAGNOSIS — R1314 Dysphagia, pharyngoesophageal phase: Secondary | ICD-10-CM | POA: Diagnosis not present

## 2023-07-23 DIAGNOSIS — M353 Polymyalgia rheumatica: Secondary | ICD-10-CM | POA: Diagnosis not present

## 2023-07-27 DIAGNOSIS — M353 Polymyalgia rheumatica: Secondary | ICD-10-CM | POA: Diagnosis not present

## 2023-07-27 DIAGNOSIS — R1314 Dysphagia, pharyngoesophageal phase: Secondary | ICD-10-CM | POA: Diagnosis not present

## 2023-07-27 DIAGNOSIS — M6281 Muscle weakness (generalized): Secondary | ICD-10-CM | POA: Diagnosis not present

## 2023-07-28 DIAGNOSIS — R1314 Dysphagia, pharyngoesophageal phase: Secondary | ICD-10-CM | POA: Diagnosis not present

## 2023-07-28 DIAGNOSIS — M6281 Muscle weakness (generalized): Secondary | ICD-10-CM | POA: Diagnosis not present

## 2023-07-28 DIAGNOSIS — M353 Polymyalgia rheumatica: Secondary | ICD-10-CM | POA: Diagnosis not present

## 2023-07-29 DIAGNOSIS — M353 Polymyalgia rheumatica: Secondary | ICD-10-CM | POA: Diagnosis not present

## 2023-07-29 DIAGNOSIS — M6281 Muscle weakness (generalized): Secondary | ICD-10-CM | POA: Diagnosis not present

## 2023-07-29 DIAGNOSIS — R1314 Dysphagia, pharyngoesophageal phase: Secondary | ICD-10-CM | POA: Diagnosis not present

## 2023-07-30 DIAGNOSIS — M6281 Muscle weakness (generalized): Secondary | ICD-10-CM | POA: Diagnosis not present

## 2023-07-30 DIAGNOSIS — R1314 Dysphagia, pharyngoesophageal phase: Secondary | ICD-10-CM | POA: Diagnosis not present

## 2023-07-30 DIAGNOSIS — M353 Polymyalgia rheumatica: Secondary | ICD-10-CM | POA: Diagnosis not present

## 2023-08-03 DIAGNOSIS — M353 Polymyalgia rheumatica: Secondary | ICD-10-CM | POA: Diagnosis not present

## 2023-08-03 DIAGNOSIS — R1314 Dysphagia, pharyngoesophageal phase: Secondary | ICD-10-CM | POA: Diagnosis not present

## 2023-08-03 DIAGNOSIS — M6281 Muscle weakness (generalized): Secondary | ICD-10-CM | POA: Diagnosis not present

## 2023-08-04 DIAGNOSIS — M6281 Muscle weakness (generalized): Secondary | ICD-10-CM | POA: Diagnosis not present

## 2023-08-04 DIAGNOSIS — M353 Polymyalgia rheumatica: Secondary | ICD-10-CM | POA: Diagnosis not present

## 2023-08-04 DIAGNOSIS — R1314 Dysphagia, pharyngoesophageal phase: Secondary | ICD-10-CM | POA: Diagnosis not present

## 2023-08-05 DIAGNOSIS — M6281 Muscle weakness (generalized): Secondary | ICD-10-CM | POA: Diagnosis not present

## 2023-08-05 DIAGNOSIS — M353 Polymyalgia rheumatica: Secondary | ICD-10-CM | POA: Diagnosis not present

## 2023-08-05 DIAGNOSIS — R1314 Dysphagia, pharyngoesophageal phase: Secondary | ICD-10-CM | POA: Diagnosis not present

## 2023-08-06 DIAGNOSIS — M6281 Muscle weakness (generalized): Secondary | ICD-10-CM | POA: Diagnosis not present

## 2023-08-06 DIAGNOSIS — R1314 Dysphagia, pharyngoesophageal phase: Secondary | ICD-10-CM | POA: Diagnosis not present

## 2023-08-06 DIAGNOSIS — M353 Polymyalgia rheumatica: Secondary | ICD-10-CM | POA: Diagnosis not present

## 2023-08-10 DIAGNOSIS — R1314 Dysphagia, pharyngoesophageal phase: Secondary | ICD-10-CM | POA: Diagnosis not present

## 2023-08-10 DIAGNOSIS — M353 Polymyalgia rheumatica: Secondary | ICD-10-CM | POA: Diagnosis not present

## 2023-08-10 DIAGNOSIS — M6281 Muscle weakness (generalized): Secondary | ICD-10-CM | POA: Diagnosis not present

## 2023-08-11 DIAGNOSIS — M353 Polymyalgia rheumatica: Secondary | ICD-10-CM | POA: Diagnosis not present

## 2023-08-11 DIAGNOSIS — M6281 Muscle weakness (generalized): Secondary | ICD-10-CM | POA: Diagnosis not present

## 2023-08-11 DIAGNOSIS — R1314 Dysphagia, pharyngoesophageal phase: Secondary | ICD-10-CM | POA: Diagnosis not present

## 2023-08-12 DIAGNOSIS — R1314 Dysphagia, pharyngoesophageal phase: Secondary | ICD-10-CM | POA: Diagnosis not present

## 2023-08-12 DIAGNOSIS — M6281 Muscle weakness (generalized): Secondary | ICD-10-CM | POA: Diagnosis not present

## 2023-08-12 DIAGNOSIS — M353 Polymyalgia rheumatica: Secondary | ICD-10-CM | POA: Diagnosis not present

## 2023-08-13 DIAGNOSIS — R1314 Dysphagia, pharyngoesophageal phase: Secondary | ICD-10-CM | POA: Diagnosis not present

## 2023-08-13 DIAGNOSIS — M6281 Muscle weakness (generalized): Secondary | ICD-10-CM | POA: Diagnosis not present

## 2023-08-13 DIAGNOSIS — M353 Polymyalgia rheumatica: Secondary | ICD-10-CM | POA: Diagnosis not present

## 2023-08-14 ENCOUNTER — Other Ambulatory Visit: Payer: Self-pay | Admitting: Internal Medicine

## 2023-08-14 MED ORDER — AMLODIPINE BESYLATE 10 MG PO TABS
10.0000 mg | ORAL_TABLET | Freq: Every day | ORAL | 1 refills | Status: DC
Start: 2023-08-14 — End: 2023-11-20

## 2023-08-14 NOTE — Telephone Encounter (Signed)
 Copied from CRM [id number]. Topic: Clinical - Medication Refill >> Aug 14, 2023 12:50 PM Retta Caster wrote: Medication: amLODipine  (NORVASC ) 10 MG tablet   Has the patient contacted their pharmacy? Yes (Agent: If no, request that the patient contact the pharmacy for the refill. If patient does not wish to contact the pharmacy document the reason why and proceed with request.) (Agent: If yes, when and what did the pharmacy advise?)  This is the patient's preferred pharmacy:  [facility] - [place], [place] - [address] [place] [address] Phone: [phone] Fax: [phone]  Is this the correct pharmacy for this prescription? Yes If no, delete pharmacy and type the correct one.   Has the prescription been filled recently? Yes  Is the patient out of the medication? Yes  Has the patient been seen for an appointment in the last year OR does the patient have an upcoming appointment? Yes  Can we respond through MyChart? Yes  Agent: Please be advised that Rx refills may take up to 3 business days. We ask that you follow-up with your pharmacy.

## 2023-08-25 DIAGNOSIS — M353 Polymyalgia rheumatica: Secondary | ICD-10-CM | POA: Diagnosis not present

## 2023-08-25 DIAGNOSIS — M6281 Muscle weakness (generalized): Secondary | ICD-10-CM | POA: Diagnosis not present

## 2023-08-27 DIAGNOSIS — M6281 Muscle weakness (generalized): Secondary | ICD-10-CM | POA: Diagnosis not present

## 2023-08-27 DIAGNOSIS — M353 Polymyalgia rheumatica: Secondary | ICD-10-CM | POA: Diagnosis not present

## 2023-09-01 DIAGNOSIS — M353 Polymyalgia rheumatica: Secondary | ICD-10-CM | POA: Diagnosis not present

## 2023-09-01 DIAGNOSIS — M6281 Muscle weakness (generalized): Secondary | ICD-10-CM | POA: Diagnosis not present

## 2023-09-03 ENCOUNTER — Other Ambulatory Visit: Payer: Self-pay | Admitting: Orthopedic Surgery

## 2023-09-03 DIAGNOSIS — M353 Polymyalgia rheumatica: Secondary | ICD-10-CM | POA: Diagnosis not present

## 2023-09-03 DIAGNOSIS — M6281 Muscle weakness (generalized): Secondary | ICD-10-CM | POA: Diagnosis not present

## 2023-09-03 NOTE — Telephone Encounter (Signed)
 High risk or very high risk warning populated when attempting to refill medication. RX request sent to PCP for review and approval if warranted.

## 2023-09-03 NOTE — Telephone Encounter (Signed)
 Copied from CRM [id number]. Topic: Clinical - Medication Refill >> Sep 03, 2023  9:37 AM Cherylann RAMAN wrote: Medication: atenolol  (TENORMIN ) 50 MG tablet  Has the patient contacted their pharmacy? Yes (Agent: If no, request that the patient contact the pharmacy for the refill. If patient does not wish to contact the pharmacy document the reason why and proceed with request.) (Agent: If yes, when and what did the pharmacy advise?)  This is the patient's preferred pharmacy:  [facility] - [place], [place] - [address] [place] [address] Phone: [phone] Fax: [phone]  Is this the correct pharmacy for this prescription? Yes If no, delete pharmacy and type the correct one.   Has the prescription been filled recently? No  Is the patient out of the medication? Yes  Has the patient been seen for an appointment in the last year OR does the patient have an upcoming appointment? Yes  Can we respond through MyChart? Yes  Agent: Please be advised that Rx refills may take up to 3 business days. We ask that you follow-up with your pharmacy.

## 2023-09-08 DIAGNOSIS — M6281 Muscle weakness (generalized): Secondary | ICD-10-CM | POA: Diagnosis not present

## 2023-09-08 DIAGNOSIS — M353 Polymyalgia rheumatica: Secondary | ICD-10-CM | POA: Diagnosis not present

## 2023-09-09 DIAGNOSIS — M810 Age-related osteoporosis without current pathological fracture: Secondary | ICD-10-CM | POA: Diagnosis not present

## 2023-09-09 DIAGNOSIS — M353 Polymyalgia rheumatica: Secondary | ICD-10-CM | POA: Diagnosis not present

## 2023-09-09 DIAGNOSIS — M2560 Stiffness of unspecified joint, not elsewhere classified: Secondary | ICD-10-CM | POA: Diagnosis not present

## 2023-09-09 DIAGNOSIS — Z6822 Body mass index (BMI) 22.0-22.9, adult: Secondary | ICD-10-CM | POA: Diagnosis not present

## 2023-09-09 DIAGNOSIS — R5383 Other fatigue: Secondary | ICD-10-CM | POA: Diagnosis not present

## 2023-09-09 DIAGNOSIS — Z79899 Other long term (current) drug therapy: Secondary | ICD-10-CM | POA: Diagnosis not present

## 2023-09-11 ENCOUNTER — Telehealth: Payer: Self-pay

## 2023-09-11 DIAGNOSIS — M6281 Muscle weakness (generalized): Secondary | ICD-10-CM | POA: Diagnosis not present

## 2023-09-11 DIAGNOSIS — M353 Polymyalgia rheumatica: Secondary | ICD-10-CM | POA: Diagnosis not present

## 2023-09-11 MED ORDER — ATENOLOL 50 MG PO TABS: 50.0000 mg | ORAL_TABLET | Freq: Every day | ORAL | 1 refills | Status: DC

## 2023-09-11 NOTE — Telephone Encounter (Signed)
 Copied from CRM [id number]. Topic: Clinical - Prescription Issue >> Sep 11, 2023  9:43 AM Mace SQUIBB wrote: Reason for CRM: Patient is still waiting on atenolol  (TENORMIN ) 50 MG tablet and mirtazapine  (REMERON ) 30 MG tablet to filled there has not been an update since 09/04/23. >> Sep 11, 2023  9:48 AM Adrianna P wrote: Patient will be out of medication by tomorrow, and has been waiting for days.

## 2023-09-11 NOTE — Telephone Encounter (Signed)
 Mirtazapine  was already send and received by pharmacy (called to verify) and atenolol  sent today.  Patient aware

## 2023-09-15 DIAGNOSIS — M353 Polymyalgia rheumatica: Secondary | ICD-10-CM | POA: Diagnosis not present

## 2023-09-15 DIAGNOSIS — M6281 Muscle weakness (generalized): Secondary | ICD-10-CM | POA: Diagnosis not present

## 2023-09-16 ENCOUNTER — Encounter: Payer: Self-pay | Admitting: Internal Medicine

## 2023-09-16 ENCOUNTER — Ambulatory Visit: Admitting: Internal Medicine

## 2023-09-16 VITALS — BP 112/75 | HR 67 | Temp 97.3°F | Resp 18 | Ht 60.0 in | Wt 116.8 lb

## 2023-09-16 DIAGNOSIS — F330 Major depressive disorder, recurrent, mild: Secondary | ICD-10-CM

## 2023-09-16 DIAGNOSIS — M810 Age-related osteoporosis without current pathological fracture: Secondary | ICD-10-CM

## 2023-09-16 DIAGNOSIS — E538 Deficiency of other specified B group vitamins: Secondary | ICD-10-CM

## 2023-09-16 DIAGNOSIS — R1310 Dysphagia, unspecified: Secondary | ICD-10-CM

## 2023-09-16 DIAGNOSIS — M353 Polymyalgia rheumatica: Secondary | ICD-10-CM

## 2023-09-16 DIAGNOSIS — E7849 Other hyperlipidemia: Secondary | ICD-10-CM | POA: Diagnosis not present

## 2023-09-16 MED ORDER — PREDNISONE 5 MG PO TABS: 2.5000 mg | ORAL_TABLET | Freq: Every day | ORAL | Status: DC

## 2023-09-16 NOTE — Progress Notes (Signed)
 Location:   [facility] of Service:   Clinic  Provider:   Code Status:  Goals of Care:     06/17/2023    9:18 AM  Advanced Directives  Does Patient Have a Medical Advance Directive? Yes  Type of Estate agent of [place];Living will  Does patient want to make changes to medical advance directive? No - Patient declined  Copy of Healthcare Power of Attorney in Chart? No - copy requested     Chief Complaint  Patient presents with   Medical Management of Chronic Issues    Three Months Follow-up.Savannah Diaz UPDATE CONTRACT!     HPI: Patient is a 89 y.o. female seen today for medical management of chronic diseases.    Lives in [facility] [place]   H/o Food Impaction on 04/26/2023 Needing EGD On Protonix  Doing well PMR Doing well on Prednisone  Depression with Anxiety Osteoporosis on Reclast    No Falls Mood is good no New issues today Past Medical History:  Diagnosis Date   Anxiety    Arthritis    qwhere; but it doesn't hurt (05/12/2012)   Aspiration pneumonia (HCC) 03/21/2016   Cataract    left   Compression fracture of first lumbar vertebra (HCC) 07/2016   superior endplate   Depression    Dysphagia    have had it for 7 yr; got worse 4 days ago (05/12/2012)   Dysphagia, pharyngoesophageal phase - suspected 05/10/2012   GERD (gastroesophageal reflux disease)    Hypertension    Insomnia    Lumbago with sciatica, right side    Migraines    menopause cured them (05/12/2012)   Osteoporosis    Stage 3 chronic kidney disease (HCC)    Uterine prolapse     Past Surgical History:  Procedure Laterality Date   CATARACT EXTRACTION W/ INTRAOCULAR LENS IMPLANT Left ? 2010   ESOPHAGEAL MANOMETRY N/A 11/08/2018   Procedure: ESOPHAGEAL MANOMETRY (EM);  Surgeon: Avram Lupita BRAVO, MD;  Location: [facility];  Service: Endoscopy;  Laterality: N/A;   ESOPHAGOGASTRODUODENOSCOPY N/A 05/10/2012   Procedure: ESOPHAGOGASTRODUODENOSCOPY (EGD);  Surgeon: Gordy CHRISTELLA Starch, MD;  Location: [facility];  Service: Gastroenterology;  Laterality: N/A;   ESOPHAGOGASTRODUODENOSCOPY N/A 04/26/2023   Procedure: ESOPHAGOGASTRODUODENOSCOPY (EGD);  Surgeon: Nandigam, Kavitha V, MD;  Location: [facility] ENDOSCOPY;  Service: Gastroenterology;  Laterality: N/A;   FOREIGN BODY REMOVAL  04/26/2023   Procedure: FOREIGN BODY REMOVAL;  Surgeon: Shila Gustav GAILS, MD;  Location: [facility] ENDOSCOPY;  Service: Gastroenterology;;   TONSILLECTOMY  1940's    Allergies  Allergen Reactions   Lactose Intolerance (Gi) Other (See Comments)    Gi tract issues   Lactulose Other (See Comments)    Gi tract issues   Lactose     Other Reaction(s): GI   Sulfa Antibiotics Hives    Childhood allergy     Outpatient Encounter Medications as of 09/16/2023  Medication Sig   amLODipine  (NORVASC ) 10 MG tablet Take 1 tablet (10 mg total) by mouth daily.   atenolol  (TENORMIN ) 50 MG tablet Take 1 tablet (50 mg total) by mouth daily.   Cholecalciferol (VITAMIN D3) 25 MCG (1000 UT) CAPS Take by mouth daily.   LORazepam  (ATIVAN ) 0.5 MG tablet Take 1 tablet (0.5 mg total) by mouth 2 (two) times daily as needed. for anxiety   melatonin 3 MG TABS tablet Take 3 mg by mouth at bedtime as needed.   mirtazapine  (REMERON ) 30 MG tablet TAKE ONE (1) TABLET BY MOUTH EVERY NIGHT  AT BEDTIME   pantoprazole  sodium (PROTONIX ) 40 mg Take 40 mg by mouth daily.   predniSONE  (DELTASONE ) 1 MG tablet Take 5 mg by mouth daily with breakfast. Patient takes 2 1/2 MG (Patient taking differently: Take 2.5 mg by mouth daily with breakfast. Patient takes 2 1/2 MG)   Psyllium (METAMUCIL 4 IN 1 FIBER) 25 % PACK 1 packet with 8 ounces of liquid as needed Orally Once a day for 30 day(s)   sertraline  (ZOLOFT ) 20 MG/ML concentrated solution TAKE ONE TEASPOONFUL ( ) BY MOUTH ONCE DAILY   traZODone  (DESYREL ) 50 MG tablet TAKE ONE TABLET BY MOUTH AT BEDTIME   vitamin B-12 (CYANOCOBALAMIN ) 1000 MCG tablet Take 1 tablet (1,000 mcg total) by mouth  daily.   [DISCONTINUED] famotidine  (PEPCID ) 40 MG tablet TAKE ONE TABLET BY MOUTH TWICE DAILY   [DISCONTINUED] fluticasone  (FLONASE ) 50 MCG/ACT nasal spray Place 1 spray into both nostrils daily as needed for allergies or rhinitis. (Patient not taking: Reported on 09/16/2023)   No facility-administered encounter medications on file as of 09/16/2023.    Review of Systems:  Review of Systems  Constitutional:  Negative for activity change and appetite change.  HENT: Negative.    Respiratory:  Negative for cough and shortness of breath.   Cardiovascular:  Negative for leg swelling.  Gastrointestinal:  Negative for constipation.  Genitourinary: Negative.   Musculoskeletal:  Negative for arthralgias, gait problem and myalgias.  Skin: Negative.   Neurological:  Negative for dizziness and weakness.  Psychiatric/Behavioral:  Positive for dysphoric mood. Negative for confusion and sleep disturbance. The patient is nervous/anxious.     Health Maintenance  Topic Date Due   COVID-19 Vaccine (10 - Moderna risk 2024-25 season) 10/26/2023 (Originally 05/26/2023)   INFLUENZA VACCINE  09/25/2023   Medicare Annual Wellness (AWV)  03/08/2024   DTaP/Tdap/Td (2 - Td or Tdap) 12/19/2027   Pneumococcal Vaccine: 50+ Years  Completed   DEXA SCAN  Completed   Zoster Vaccines- Shingrix  Completed   Hepatitis B Vaccines  Aged Out   HPV VACCINES  Aged Out   Meningococcal B Vaccine  Aged Out    Physical Exam: Vitals:   09/16/23 0909  BP: 112/75  Pulse: 67  Resp: 18  Temp: (!) 97.3 F (36.3 C)  SpO2: 96%  Weight: 116 lb 12.8 oz (53 kg)  Height: 5' (1.524 m)   Body mass index is 22.81 kg/m. Physical Exam Vitals reviewed.  Constitutional:      Appearance: Normal appearance.  HENT:     Head: Normocephalic.     Right Ear: Tympanic membrane normal.     Left Ear: Tympanic membrane normal.     Nose: Nose normal.     Mouth/Throat:     Mouth: Mucous membranes are moist.     Pharynx: Oropharynx is  clear.  Eyes:     Pupils: Pupils are equal, round, and reactive to light.  Cardiovascular:     Rate and Rhythm: Normal rate and regular rhythm.     Pulses: Normal pulses.     Heart sounds: Normal heart sounds. No murmur heard. Pulmonary:     Effort: Pulmonary effort is normal.     Breath sounds: Normal breath sounds.  Abdominal:     General: Abdomen is flat. Bowel sounds are normal.     Palpations: Abdomen is soft.  Musculoskeletal:        General: No swelling.     Cervical back: Neck supple.  Skin:    General: Skin is warm.  Neurological:     General: No focal deficit present.     Mental Status: She is alert and oriented to person, place, and time.  Psychiatric:        Mood and Affect: Mood normal.        Thought Content: Thought content normal.     Labs reviewed: Basic Metabolic Panel: Recent Labs    01/08/23 0806 02/02/23 0800 04/26/23 0948  NA 142 142 142  K 3.4* 3.7 3.2*  CL 105 105 105  CO2 30 30 27   GLUCOSE 94 85 136*  BUN 15 20 19   CREATININE 0.83 0.87 0.83  CALCIUM  8.8 9.2 9.3  MG  --  1.9  --   TSH 4.02  --   --    Liver Function Tests: Recent Labs    01/08/23 0806  AST 15  ALT 17  BILITOT 0.8  PROT 6.0*   No results for input(s): LIPASE, AMYLASE in the last 8760 hours. No results for input(s): AMMONIA in the last 8760 hours. CBC: Recent Labs    01/08/23 0806 04/26/23 0948  WBC 6.2 7.9  NEUTROABS 3,553 5.9  HGB 12.7 13.3  HCT 38.8 39.5  MCV 94.6 91.0  PLT 216 252   Lipid Panel: Recent Labs    01/08/23 0806  CHOL 241*  HDL 58  LDLCALC 161*  TRIG 103  CHOLHDL 4.2   Lab Results  Component Value Date   HGBA1C 5.5 10/24/2021    Procedures since last visit: No results found.  Assessment/Plan 1. Dysphagia, unspecified type (Primary) Worked with ST  Follows the restrictive diet and is careful with Chewing  Has h/o Esophageal Food Impaction needing EGD  in 3/25 2. PMR (polymyalgia rheumatica) (HCC) Follows with  Rheumatology On 2.5 mg of Prednisone  now  3. Mild episode of recurrent major depressive disorder (HCC) Doing well with Ativan  Zoloft  and Remeron  Uses Ativan  for anxiety Lost her therapist but think she does not need new therapist right now  4. Age-related osteoporosis without current pathological fracture Reclast  per Rheumatology  5. Other hyperlipidemia Repeat Labs  6. Vitamin B 12 deficiency Repeat Labs 7 Vit d Def Repeat Labs   Labs/tests ordered:   Next appt:  Visit date not found

## 2023-09-16 NOTE — Patient Instructions (Signed)
 Labs will be done on  November 24 th at [facility].

## 2023-09-17 DIAGNOSIS — M6281 Muscle weakness (generalized): Secondary | ICD-10-CM | POA: Diagnosis not present

## 2023-09-17 DIAGNOSIS — M353 Polymyalgia rheumatica: Secondary | ICD-10-CM | POA: Diagnosis not present

## 2023-09-22 DIAGNOSIS — M353 Polymyalgia rheumatica: Secondary | ICD-10-CM | POA: Diagnosis not present

## 2023-09-22 DIAGNOSIS — M6281 Muscle weakness (generalized): Secondary | ICD-10-CM | POA: Diagnosis not present

## 2023-09-24 ENCOUNTER — Other Ambulatory Visit: Payer: Self-pay | Admitting: Internal Medicine

## 2023-09-24 DIAGNOSIS — M6281 Muscle weakness (generalized): Secondary | ICD-10-CM | POA: Diagnosis not present

## 2023-09-24 DIAGNOSIS — M353 Polymyalgia rheumatica: Secondary | ICD-10-CM | POA: Diagnosis not present

## 2023-09-24 NOTE — Telephone Encounter (Signed)
 Copied from CRM [id number]. Topic: Clinical - Medication Refill >> Sep 24, 2023 10:17 AM Vivian Z wrote: Medication: traZODone  (DESYREL ) 50 MG tablet  Has the patient contacted their pharmacy? Yes (Agent: If no, request that the patient contact the pharmacy for the refill. If patient does not wish to contact the pharmacy document the reason why and proceed with request.) (Agent: If yes, when and what did the pharmacy advise?)  This is the patient's preferred pharmacy:  [facility] - [place], [place] - [address] [place] [address] Phone: [phone] Fax: [phone]  Is this the correct pharmacy for this prescription? Yes If no, delete pharmacy and type the correct one.   Has the prescription been filled recently? No  Is the patient out of the medication? No  Has the patient been seen for an appointment in the last year OR does the patient have an upcoming appointment? Yes  Can we respond through MyChart? No  Agent: Please be advised that Rx refills may take up to 3 business days. We ask that you follow-up with your pharmacy.

## 2023-10-10 DIAGNOSIS — Z23 Encounter for immunization: Secondary | ICD-10-CM | POA: Diagnosis not present

## 2023-10-27 DIAGNOSIS — H26491 Other secondary cataract, right eye: Secondary | ICD-10-CM | POA: Diagnosis not present

## 2023-10-27 DIAGNOSIS — Z961 Presence of intraocular lens: Secondary | ICD-10-CM | POA: Diagnosis not present

## 2023-10-27 DIAGNOSIS — H04123 Dry eye syndrome of bilateral lacrimal glands: Secondary | ICD-10-CM | POA: Diagnosis not present

## 2023-11-04 DIAGNOSIS — M353 Polymyalgia rheumatica: Secondary | ICD-10-CM | POA: Diagnosis not present

## 2023-11-04 DIAGNOSIS — Z6821 Body mass index (BMI) 21.0-21.9, adult: Secondary | ICD-10-CM | POA: Diagnosis not present

## 2023-11-04 DIAGNOSIS — M810 Age-related osteoporosis without current pathological fracture: Secondary | ICD-10-CM | POA: Diagnosis not present

## 2023-11-04 DIAGNOSIS — R5383 Other fatigue: Secondary | ICD-10-CM | POA: Diagnosis not present

## 2023-11-04 DIAGNOSIS — M2560 Stiffness of unspecified joint, not elsewhere classified: Secondary | ICD-10-CM | POA: Diagnosis not present

## 2023-11-17 NOTE — Patient Instructions (Signed)
 1) Please visit your local pharmacy to receive your Covid vaccine, if you have not already received.    2) Labs will be done on  January 18, 2024 at 7:45 am at [facility]

## 2023-11-18 ENCOUNTER — Encounter: Payer: Self-pay | Admitting: Internal Medicine

## 2023-11-18 ENCOUNTER — Non-Acute Institutional Stay: Admitting: Internal Medicine

## 2023-11-18 VITALS — BP 124/62 | HR 62 | Temp 97.3°F | Resp 18 | Ht 60.0 in | Wt 116.8 lb

## 2023-11-18 DIAGNOSIS — M25511 Pain in right shoulder: Secondary | ICD-10-CM

## 2023-11-18 DIAGNOSIS — M545 Low back pain, unspecified: Secondary | ICD-10-CM | POA: Diagnosis not present

## 2023-11-18 DIAGNOSIS — M25512 Pain in left shoulder: Secondary | ICD-10-CM

## 2023-11-18 DIAGNOSIS — G8929 Other chronic pain: Secondary | ICD-10-CM | POA: Diagnosis not present

## 2023-11-18 DIAGNOSIS — M353 Polymyalgia rheumatica: Secondary | ICD-10-CM | POA: Diagnosis not present

## 2023-11-19 ENCOUNTER — Other Ambulatory Visit: Payer: Self-pay | Admitting: Internal Medicine

## 2023-11-19 NOTE — Telephone Encounter (Signed)
 Pharmacy requested refill.  Pended Rx and sent to Dr. Chales Abrahams for approval due to HIGH ALERT Warning.

## 2023-11-22 NOTE — Progress Notes (Signed)
 Location:  [facility] Service:  Clinic (12)  Provider:   Code Status:  Goals of Care:     06/17/2023    9:18 AM  Advanced Directives  Does Patient Have a Medical Advance Directive? Yes  Type of Estate agent of [place];Living will  Does patient want to make changes to medical advance directive? No - Patient declined  Copy of Healthcare Power of Attorney in Chart? No - copy requested     Chief Complaint  Patient presents with   Joint Swelling    possible bursitis in right elbow    HPI: Patient is a 89 y.o. female seen today for medical management of chronic diseases.    Lives in [facility] [place]  Discussed the use of AI scribe software for clinical note transcription with the patient, who gave verbal consent to proceed.  History of Present Illness   Savannah Savannah is an 89 year old female with polymyalgia rheumatica who presents with back pain and anxiety.  She experiences upper back pain that worsens with wearing a bra and is temporarily relieved by massages and wearing a supportive belt. The pain is intermittent, sometimes radiating to the right side, and is associated with knots in her back. Her medical history includes a fall in 2018 with an L1 compression fracture, scoliosis, arthritis, and spondylosis.  She is tapering off prednisone  for polymyalgia rheumatica, currently taking half a tablet daily, with plans to take it every other day starting Thursday.  For anxiety, she takes lorazepam , 0.5 mg, two tablets in the morning, related to her son's frequent travels.   She has tried heating pads and Tylenol  for back pain, with limited relief, and is considering topical treatments like Voltaren gel and Biofreeze. Also Got Massage for her back  Also c/o Some swelling in her Elbow wanted to make sure it was not bursitis  Other history H/o Food Impaction on 04/26/2023 Needing EGD On Protonix  Doing well PMR on Prednisone  Depression with  Anxiety Osteoporosis on Reclast        Past Medical History:  Diagnosis Date   Anxiety    Arthritis    qwhere; but it doesn't hurt (05/12/2012)   Aspiration pneumonia (HCC) 03/21/2016   Cataract    left   Compression fracture of first lumbar vertebra (HCC) 07/2016   superior endplate   Depression    Dysphagia    have had it for 7 yr; got worse 4 days ago (05/12/2012)   Dysphagia, pharyngoesophageal phase - suspected 05/10/2012   GERD (gastroesophageal reflux disease)    Hypertension    Insomnia    Lumbago with sciatica, right side    Migraines    menopause cured them (05/12/2012)   Osteoporosis    Stage 3 chronic kidney disease (HCC)    Uterine prolapse     Past Surgical History:  Procedure Laterality Date   CATARACT EXTRACTION W/ INTRAOCULAR LENS IMPLANT Left ? 2010   ESOPHAGEAL MANOMETRY N/A 11/08/2018   Procedure: ESOPHAGEAL MANOMETRY (EM);  Surgeon: Avram Lupita BRAVO, MD;  Location: [facility];  Service: Endoscopy;  Laterality: N/A;   ESOPHAGOGASTRODUODENOSCOPY N/A 05/10/2012   Procedure: ESOPHAGOGASTRODUODENOSCOPY (EGD);  Surgeon: Gordy CHRISTELLA Starch, MD;  Location: [facility];  Service: Gastroenterology;  Laterality: N/A;   ESOPHAGOGASTRODUODENOSCOPY N/A 04/26/2023   Procedure: ESOPHAGOGASTRODUODENOSCOPY (EGD);  Surgeon: Nandigam, Kavitha V, MD;  Location: [facility];  Service: Gastroenterology;  Laterality: N/A;   FOREIGN BODY REMOVAL  04/26/2023   Procedure: FOREIGN BODY  REMOVAL;  Surgeon: Shila Gustav GAILS, MD;  Location: [facility];  Service: Gastroenterology;;   TONSILLECTOMY  [phone]    Allergies  Allergen Reactions   Lactose Intolerance (Gi) Other (See Comments)    Gi tract issues   Lactulose Other (See Comments)    Gi tract issues   Lactose     Other Reaction(s): GI   Sulfa Antibiotics Hives    Childhood allergy     Outpatient Encounter Medications as of 11/18/2023  Medication Sig   atenolol  (TENORMIN ) 50 MG tablet Take 1 tablet (50 mg total) by mouth  daily.   Cholecalciferol (VITAMIN D3) 25 MCG (1000 UT) CAPS Take by mouth daily.   LORazepam  (ATIVAN ) 0.5 MG tablet Take 1 tablet (0.5 mg total) by mouth 2 (two) times daily as needed. for anxiety   melatonin 3 MG TABS tablet Take 3 mg by mouth at bedtime as needed.   mirtazapine  (REMERON ) 30 MG tablet TAKE ONE (1) TABLET BY MOUTH EVERY NIGHT AT BEDTIME   pantoprazole  sodium (PROTONIX ) 40 mg Take 40 mg by mouth daily.   predniSONE  (DELTASONE ) 5 MG tablet Take 0.5 tablets (2.5 mg total) by mouth daily with breakfast.   Psyllium (METAMUCIL 4 IN 1 FIBER) 25 % PACK 1 packet with 8 ounces of liquid as needed Orally Once a day for 30 day(s)   traZODone  (DESYREL ) 50 MG tablet TAKE ONE (1) TABLET BY MOUTH EVERY NIGHT AT BEDTIME   vitamin B-12 (CYANOCOBALAMIN ) 1000 MCG tablet Take 1 tablet (1,000 mcg total) by mouth daily.   [DISCONTINUED] amLODipine  (NORVASC ) 10 MG tablet Take 1 tablet (10 mg total) by mouth daily.   [DISCONTINUED] sertraline  (ZOLOFT ) 20 MG/ML concentrated solution TAKE ONE TEASPOONFUL ( ) BY MOUTH ONCE DAILY   No facility-administered encounter medications on file as of 11/18/2023.    Review of Systems:  Review of Systems  Constitutional:  Negative for activity change and appetite change.  HENT: Negative.    Respiratory:  Negative for cough and shortness of breath.   Cardiovascular:  Negative for leg swelling.  Gastrointestinal:  Negative for constipation.  Genitourinary: Negative.   Musculoskeletal:  Positive for arthralgias, back pain and myalgias. Negative for gait problem.  Skin: Negative.   Neurological:  Negative for dizziness and weakness.  Psychiatric/Behavioral:  Negative for confusion, dysphoric mood and sleep disturbance. The patient is nervous/anxious.     Health Maintenance  Topic Date Due   COVID-19 Vaccine (10 - Moderna risk 2024-25 season) 10/26/2023   Medicare Annual Wellness (AWV)  03/08/2024   DTaP/Tdap/Td (2 - Td or Tdap) 12/19/2027   Pneumococcal  Vaccine: 50+ Years  Completed   Influenza Vaccine  Completed   DEXA SCAN  Completed   Zoster Vaccines- Shingrix  Completed   HPV VACCINES  Aged Out   Meningococcal B Vaccine  Aged Out    Physical Exam: Vitals:   11/18/23 0924  BP: 124/62  Pulse: 62  Resp: 18  Temp: (!) 97.3 F (36.3 C)  SpO2: 96%  Weight: 116 lb 12.8 oz (53 kg)  Height: 5' (1.524 m)   Body mass index is 22.81 kg/m. Physical Exam Vitals reviewed.  Constitutional:      Appearance: Normal appearance.  HENT:     Head: Normocephalic.     Nose: Nose normal.     Mouth/Throat:     Mouth: Mucous membranes are moist.     Pharynx: Oropharynx is clear.  Eyes:     Pupils: Pupils are equal, round, and reactive to light.  Cardiovascular:     Rate and Rhythm: Normal rate and regular rhythm.     Pulses: Normal pulses.     Heart sounds: Normal heart sounds. No murmur heard. Pulmonary:     Effort: Pulmonary effort is normal.     Breath sounds: Normal breath sounds.  Abdominal:     General: Abdomen is flat. Bowel sounds are normal.     Palpations: Abdomen is soft.  Musculoskeletal:        General: No swelling.     Cervical back: Neck supple.     Comments: Arthritis Noticed in Both Shoulders Right ore then left No Swelling or Any other concerns in Elbow No Tenderness in her back   Skin:    General: Skin is warm.  Neurological:     General: No focal deficit present.     Mental Status: She is alert and oriented to person, place, and time.  Psychiatric:        Mood and Affect: Mood normal.        Thought Content: Thought content normal.     Labs reviewed: Basic Metabolic Panel: Recent Labs    01/08/23 0806 02/02/23 0800 04/26/23 0948  NA 142 142 142  K 3.4* 3.7 3.2*  CL 105 105 105  CO2 30 30 27   GLUCOSE 94 85 136*  BUN 15 20 19   CREATININE 0.83 0.87 0.83  CALCIUM  8.8 9.2 9.3  MG  --  1.9  --   TSH 4.02  --   --    Liver Function Tests: Recent Labs    01/08/23 0806  AST 15  ALT 17   BILITOT 0.8  PROT 6.0*   No results for input(s): LIPASE, AMYLASE in the last 8760 hours. No results for input(s): AMMONIA in the last 8760 hours. CBC: Recent Labs    01/08/23 0806 04/26/23 0948  WBC 6.2 7.9  NEUTROABS 3,553 5.9  HGB 12.7 13.3  HCT 38.8 39.5  MCV 94.6 91.0  PLT 216 252   Lipid Panel: Recent Labs    01/08/23 0806  CHOL 241*  HDL 58  LDLCALC 161*  TRIG 103  CHOLHDL 4.2   Lab Results  Component Value Date   HGBA1C 5.5 10/24/2021    Procedures since last visit: No results found.  Assessment/Plan Assessment and Plan    Polymyalgia rheumatica (PMR) PMR managed with prednisone  taper to minimize long-term side effects.Per Rheumatology - Continue prednisone  taper, transitioning to every other day dosing.  Chronic back pain with lumbar spondylosis, scoliosis, and L1 compression fracture Chronic back pain due to lumbar spondylosis, scoliosis, and L1 compression fracture. Pain management complicated by age-related arthritis and previous injuries. - Use heating pads for muscle relaxation. - Consider over-the-counter options like Biofreeze for pain relief. - Continue massage therapy with caution. - Consider referral to a specialist for injections if pain becomes unmanageable.  Shoulder osteoarthritis Shoulder pain likely due to osteoarthritis, indicated by crepitus and pain without swelling. - Consider mild massage therapy for muscle relaxation. - Use heating pads for symptomatic relief.  Anxiety disorder Anxiety managed with lorazepam , exacerbated by concerns about son's travels. - Continue lorazepam  0.5 mg twice in the morning as needed for anxiety.         Labs/tests ordered:  * No order type specified * Next appt:  11/19/2023

## 2023-11-26 ENCOUNTER — Ambulatory Visit: Payer: Self-pay

## 2023-11-26 NOTE — Telephone Encounter (Signed)
 FYI Only or Action Required?: Action required by provider: clinical question for provider.  Patient was last seen in primary care on 11/18/2023 by Charlanne Fredia CROME, MD.  Called Nurse Triage reporting Depression.  Symptoms began today.  Interventions attempted: Prescription medications: zoloft .  Symptoms are: gradually worsening.  Triage Disposition: See Physician Within 24 Hours  Patient/caregiver understands and will follow disposition?: No, wishes to speak with PCP   Rn attempted to call CAL, answered and transferred to clinical staff VM. LVM. No appts until next Wednesday, pt ultimately wants medication increased/changed. Pt prefers only to see Dr. Charlanne.       Copied from CRM [id number]. Topic: Clinical - Red Word Triage >> Nov 26, 2023  2:04 PM Fredrica W wrote: Red Word that prompted transfer to Nurse Triage: Depression - on medication but not feeling good.   ----------------------------------------------------------------------- From previous Reason for Contact - Scheduling: Patient/patient representative is calling to schedule an appointment. Refer to attachments for appointment information. Reason for Disposition  [1] Depression AND [2] getting worse (e.g., sleeping poorly, less able to do activities of daily living)  Answer Assessment - Initial Assessment Questions Pt states she is having worsening depression symptoms. She says she is is her own worse enemy because she gets a pain and then becomes nervous about it. She states she has little interest in doing things that she usually enjoys doing. States her sleep is kind of all over but attributed that to age. She states she did start a magnesium  supplement called calm and that helped the last two nights. Nervous    1. CONCERN: What happened that made you call today?     Feeling older, aches and pains 2. DEPRESSION SYMPTOM SCREENING: How are you feeling overall? (e.g., decreased energy, increased sleeping or difficulty  sleeping, difficulty concentrating, feelings of sadness, guilt, hopelessness, or worthlessness)     Doesn't do well with pain 3. RISK OF HARM - SUICIDAL IDEATION:  Do you ever have thoughts of hurting or killing yourself?  (e.g., yes, no, no but preoccupation with thoughts about death)     no 4. RISK OF HARM - HOMICIDAL IDEATION:  Do you ever have thoughts of hurting or killing someone else?  (e.g., yes, no, no but preoccupation with thoughts about death)     no 5. FUNCTIONAL IMPAIRMENT: How have things been going for you overall? Have you had more difficulty than usual doing your normal daily activities?  (e.g., better, same, worse; self-care, school, work, interactions)     Just doesn't feel like doing the things she likes 6. SUPPORT: Who is with you now? Who do you live with? Do you have family or friends who you can talk to?      family 7. THERAPIST: Do you have a counselor or therapist? If Yes, ask: What is their name?     no 8. STRESSORS: Has there been any new stress or recent changes in your life?   Pain in her back and she doesn't tolerate it well 9. ALCOHOL USE OR SUBSTANCE USE (DRUG USE): Do you drink alcohol or use any illegal drugs?      10. OTHER: Do you have any other physical symptoms right now? (e.g., fever)       pain  Protocols used: Depression-A-AH

## 2023-11-26 NOTE — Telephone Encounter (Signed)
 Spoke with patient and scheduled appointment for next Wednesday

## 2023-11-26 NOTE — Telephone Encounter (Signed)
 Just add her to my Wed [place]

## 2023-11-27 ENCOUNTER — Other Ambulatory Visit: Payer: Self-pay | Admitting: Internal Medicine

## 2023-11-27 NOTE — Telephone Encounter (Signed)
 Patient has request for refill on Lorazepam . Patient last refill was 06/26/2023. Patient has contract on file dated 09/14/2023. Medication pend and sent to PCP Zane, Fredia CROME, MD) for approval.

## 2023-12-01 NOTE — Patient Instructions (Signed)
 1) Please visit your local pharmacy to receive your covid vaccine, if you have not already received.    Take 1 and one quarter Spoon of Zoloft   to start with for 1 week and then you can go up to one and Half Spoon at night

## 2023-12-02 ENCOUNTER — Non-Acute Institutional Stay: Admitting: Internal Medicine

## 2023-12-02 ENCOUNTER — Encounter: Payer: Self-pay | Admitting: Internal Medicine

## 2023-12-02 VITALS — BP 128/78 | HR 63 | Temp 97.6°F | Resp 18 | Ht 60.0 in | Wt 115.1 lb

## 2023-12-02 DIAGNOSIS — G8929 Other chronic pain: Secondary | ICD-10-CM | POA: Diagnosis not present

## 2023-12-02 DIAGNOSIS — F330 Major depressive disorder, recurrent, mild: Secondary | ICD-10-CM | POA: Diagnosis not present

## 2023-12-02 DIAGNOSIS — M25512 Pain in left shoulder: Secondary | ICD-10-CM | POA: Diagnosis not present

## 2023-12-02 DIAGNOSIS — E538 Deficiency of other specified B group vitamins: Secondary | ICD-10-CM

## 2023-12-02 DIAGNOSIS — M353 Polymyalgia rheumatica: Secondary | ICD-10-CM

## 2023-12-02 DIAGNOSIS — M545 Low back pain, unspecified: Secondary | ICD-10-CM | POA: Diagnosis not present

## 2023-12-02 DIAGNOSIS — M25511 Pain in right shoulder: Secondary | ICD-10-CM | POA: Diagnosis not present

## 2023-12-02 DIAGNOSIS — M810 Age-related osteoporosis without current pathological fracture: Secondary | ICD-10-CM

## 2023-12-02 DIAGNOSIS — E7849 Other hyperlipidemia: Secondary | ICD-10-CM

## 2023-12-02 DIAGNOSIS — R1310 Dysphagia, unspecified: Secondary | ICD-10-CM

## 2023-12-02 MED ORDER — SERTRALINE HCL 20 MG/ML PO CONC: ORAL | Status: DC

## 2023-12-02 MED ORDER — SERTRALINE HCL 20 MG/ML PO CONC: ORAL | 5 refills | Status: DC

## 2023-12-02 NOTE — Progress Notes (Unsigned)
 [facility]  Cl[facility] (12)  Provider:   Code Status: *** Goals of Care:     06/17/2023    9:18 AM  Advanced Directives  Does Patient Have a Medical Advance Directive? Yes  Type of Estate agent of [place];Living will  Does patient want to make changes to medical advance directive? No - Patient declined  Copy of Healthcare Power of Attorney in Chart? No - copy requested     Chief Complaint  Patient presents with  . Medical Management of Chronic Issues    Depression follow-up, needs to discuss covid vaccine     HPI: Patient is a 89 y.o. female seen today for an acute visit for  Past Medical History:  Diagnosis Date  . Anxiety   . Arthritis    qwhere; but it doesn't hurt (05/12/2012)  . Aspiration pneumonia (HCC) 03/21/2016  . Cataract    left  . Compression fracture of first lumbar vertebra (HCC) 07/2016   superior endplate  . Depression   . Dysphagia    have had it for 7 yr; got worse 4 days ago (05/12/2012)  . Dysphagia, pharyngoesophageal phase - suspected 05/10/2012  . GERD (gastroesophageal reflux disease)   . Hypertension   . Insomnia   . Lumbago with sciatica, right side   . Migraines    menopause cured them (05/12/2012)  . Osteoporosis   . Stage 3 chronic kidney disease (HCC)   . Uterine prolapse     Past Surgical History:  Procedure Laterality Date  . CATARACT EXTRACTION W/ INTRAOCULAR LENS IMPLANT Left ? 2010  . ESOPHAGEAL MANOMETRY N/A 11/08/2018   Procedure: ESOPHAGEAL MANOMETRY (EM);  Surgeon: Avram Lupita BRAVO, MD;  Location: [facility];  Service: Endoscopy;  Laterality: N/A;  . ESOPHAGOGASTRODUODENOSCOPY N/A 05/10/2012   Procedure: ESOPHAGOGASTRODUODENOSCOPY (EGD);  Surgeon: Gordy CHRISTELLA Starch, MD;  Location: [facility];  Service: Gastroenterology;  Laterality: N/A;  . ESOPHAGOGASTRODUODENOSCOPY N/A 04/26/2023   Procedure: ESOPHAGOGASTRODUODENOSCOPY (EGD);  Surgeon: Nandigam, Kavitha V, MD;   Location: [facility];  Service: Gastroenterology;  Laterality: N/A;  . FOREIGN BODY REMOVAL  04/26/2023   Procedure: FOREIGN BODY REMOVAL;  Surgeon: Shila Gustav GAILS, MD;  Location: [facility];  Service: Gastroenterology;;  . TONSILLECTOMY  1940's    Allergies  Allergen Reactions  . Lactose Intolerance (Gi) Other (See Comments)    Gi tract issues  . Lactulose Other (See Comments)    Gi tract issues  . Lactose     Other Reaction(s): GI  . Sulfa Antibiotics Hives    Childhood allergy     Outpatient Encounter Medications as of 12/02/2023  Medication Sig  . amLODipine  (NORVASC ) 10 MG tablet TAKE ONE (1) TABLET BY MOUTH EVERY DAY  . atenolol  (TENORMIN ) 50 MG tablet Take 1 tablet (50 mg total) by mouth daily.  . Cholecalciferol (VITAMIN D3) 25 MCG (1000 UT) CAPS Take by mouth daily.  . LORazepam  (ATIVAN ) 0.5 MG tablet Take 1 tablet (0.5 mg total) by mouth 2 (two) times daily as needed. for anxiety  . melatonin 3 MG TABS tablet Take 3 mg by mouth at bedtime as needed.  . mirtazapine  (REMERON ) 30 MG tablet TAKE ONE (1) TABLET BY MOUTH EVERY NIGHT AT BEDTIME  . pantoprazole  sodium (PROTONIX ) 40 mg Take 40 mg by mouth daily.  . predniSONE  (DELTASONE ) 5 MG tablet Take 0.5 tablets (2.5 mg total) by mouth daily with breakfast.  . Psyllium (METAMUCIL 4 IN 1 FIBER) 25 %  PACK 1 packet with 8 ounces of liquid as needed Orally Once a day for 30 day(s)  . sertraline  (ZOLOFT ) 20 MG/ML concentrated solution TAKE ONE TEASPOONFUL ( ) BY MOUTH ONCE DAILY  . traZODone  (DESYREL ) 50 MG tablet TAKE ONE (1) TABLET BY MOUTH EVERY NIGHT AT BEDTIME  . vitamin B-12 (CYANOCOBALAMIN ) 1000 MCG tablet Take 1 tablet (1,000 mcg total) by mouth daily.   No facility-administered encounter medications on file as of 12/02/2023.    Review of Systems:  Review of Systems  Health Maintenance  Topic Date Due  . COVID-19 Vaccine (10 - Moderna risk 2024-25 season) 10/26/2023  . Medicare Annual Wellness (AWV)   03/08/2024  . DTaP/Tdap/Td (2 - Td or Tdap) 12/19/2027  . Pneumococcal Vaccine: 50+ Years  Completed  . Influenza Vaccine  Completed  . DEXA SCAN  Completed  . Zoster Vaccines- Shingrix  Completed  . Meningococcal B Vaccine  Aged Out    Physical Exam: There were no vitals filed for this visit. There is no height or weight on file to calculate BMI. Physical Exam  Labs reviewed: Basic Metabolic Panel: Recent Labs    01/08/23 0806 02/02/23 0800 04/26/23 0948  NA 142 142 142  K 3.4* 3.7 3.2*  CL 105 105 105  CO2 30 30 27   GLUCOSE 94 85 136*  BUN 15 20 19   CREATININE 0.83 0.87 0.83  CALCIUM  8.8 9.2 9.3  MG  --  1.9  --   TSH 4.02  --   --    Liver Function Tests: Recent Labs    01/08/23 0806  AST 15  ALT 17  BILITOT 0.8  PROT 6.0*   No results for input(s): LIPASE, AMYLASE in the last 8760 hours. No results for input(s): AMMONIA in the last 8760 hours. CBC: Recent Labs    01/08/23 0806 04/26/23 0948  WBC 6.2 7.9  NEUTROABS 3,553 5.9  HGB 12.7 13.3  HCT 38.8 39.5  MCV 94.6 91.0  PLT 216 252   Lipid Panel: Recent Labs    01/08/23 0806  CHOL 241*  HDL 58  LDLCALC 161*  TRIG 103  CHOLHDL 4.2   Lab Results  Component Value Date   HGBA1C 5.5 10/24/2021    Procedures since last visit: No results found.  Assessment/Plan There are no diagnoses linked to this encounter.   Labs/tests ordered:  * No order type specified * Next appt:  01/20/2024

## 2023-12-11 ENCOUNTER — Other Ambulatory Visit: Payer: Self-pay | Admitting: Internal Medicine

## 2023-12-25 ENCOUNTER — Other Ambulatory Visit: Payer: Self-pay | Admitting: Internal Medicine

## 2023-12-25 ENCOUNTER — Other Ambulatory Visit: Payer: Self-pay

## 2023-12-25 MED ORDER — PREDNISONE 5 MG PO TABS: 2.5000 mg | ORAL_TABLET | Freq: Every day | ORAL | 0 refills | Status: DC

## 2023-12-25 MED ORDER — LORAZEPAM 0.5 MG PO TABS: 0.5000 mg | ORAL_TABLET | Freq: Two times a day (BID) | ORAL | 5 refills | Status: DC | PRN

## 2023-12-25 NOTE — Telephone Encounter (Signed)
Patient requested refill °Pended Rx and sent to Dr. Gupta for approval.  °

## 2023-12-25 NOTE — Telephone Encounter (Signed)
 Received fax information from patients pharmacy pt needing a refill. Pharmacy notes stated she will run out on Monday Patient has request for refill on 12/25/2023. Patient last refill was 12/02/2023. Patient has contract on file dated 09/14/2023. Patient has upcoming appointment  01/20/2024. Medication pend and sent to PCP Zane, Fredia CROME, MD) for approval.

## 2023-12-25 NOTE — Telephone Encounter (Signed)
 Copied from CRM #[id number]. Topic: Clinical - Medication Refill >> Dec 25, 2023 10:46 AM Diannia H wrote: Medication: predniSONE  (DELTASONE ) 5 MG tablet  Has the patient contacted their pharmacy? Yes (Agent: If no, request that the patient contact the pharmacy for the refill. If patient does not wish to contact the pharmacy document the reason why and proceed with request.) (Agent: If yes, when and what did the pharmacy advise?)  This is the patient's preferred pharmacy:  [id number] - [address] [place] [address] Phone: [phone] Fax: [phone]  Is this the correct pharmacy for this prescription? Yes If no, delete pharmacy and type the correct one.   Has the prescription been filled recently? No  Is the patient out of the medication? No  Has the patient been seen for an appointment in the last year OR does the patient have an upcoming appointment? Yes  Can we respond through MyChart? Yes  Agent: Please be advised that Rx refills may take up to 3 business days. We ask that you follow-up with your pharmacy.

## 2024-01-05 DIAGNOSIS — M353 Polymyalgia rheumatica: Secondary | ICD-10-CM | POA: Diagnosis not present

## 2024-01-05 LAB — BASIC METABOLIC PANEL WITH GFR
BUN: 21 (ref 4–21)
CO2: 25 — AB (ref 13–22)
Chloride: 102 (ref 99–108)
Creatinine: 1 (ref 0.5–1.1)
Glucose: 90
Potassium: 4.1 meq/L (ref 3.5–5.1)
Sodium: 140 (ref 137–147)

## 2024-01-05 LAB — HEPATIC FUNCTION PANEL
ALT: 16 U/L (ref 7–35)
AST: 15 (ref 13–35)
Alkaline Phosphatase: 43 (ref 25–125)
Bilirubin, Total: 0.6

## 2024-01-05 LAB — COMPREHENSIVE METABOLIC PANEL WITH GFR
Albumin: 4 (ref 3.5–5.0)
Calcium: 9.1 (ref 8.7–10.7)
Globulin: 2
eGFR: 57

## 2024-01-05 LAB — CBC: RBC: 4.21 (ref 3.87–5.11)

## 2024-01-05 LAB — COMPREHENSIVE METABOLIC PANEL (CC13): EGFR: 57

## 2024-01-07 ENCOUNTER — Encounter: Payer: Self-pay | Admitting: Internal Medicine

## 2024-01-13 DIAGNOSIS — Z6822 Body mass index (BMI) 22.0-22.9, adult: Secondary | ICD-10-CM | POA: Diagnosis not present

## 2024-01-13 DIAGNOSIS — R5383 Other fatigue: Secondary | ICD-10-CM | POA: Diagnosis not present

## 2024-01-13 DIAGNOSIS — M353 Polymyalgia rheumatica: Secondary | ICD-10-CM | POA: Diagnosis not present

## 2024-01-13 DIAGNOSIS — M2560 Stiffness of unspecified joint, not elsewhere classified: Secondary | ICD-10-CM | POA: Diagnosis not present

## 2024-01-13 DIAGNOSIS — M810 Age-related osteoporosis without current pathological fracture: Secondary | ICD-10-CM | POA: Diagnosis not present

## 2024-01-14 ENCOUNTER — Other Ambulatory Visit: Payer: Self-pay

## 2024-01-14 ENCOUNTER — Encounter (HOSPITAL_BASED_OUTPATIENT_CLINIC_OR_DEPARTMENT_OTHER): Payer: Self-pay | Admitting: Emergency Medicine

## 2024-01-14 ENCOUNTER — Emergency Department (HOSPITAL_BASED_OUTPATIENT_CLINIC_OR_DEPARTMENT_OTHER)
Admission: EM | Admit: 2024-01-14 | Discharge: 2024-01-14 | Disposition: A | Discharge status: Home / Self Care | Attending: Emergency Medicine | Admitting: Emergency Medicine

## 2024-01-14 ENCOUNTER — Encounter (HOSPITAL_COMMUNITY): Payer: Self-pay | Admitting: *Deleted

## 2024-01-14 ENCOUNTER — Emergency Department (HOSPITAL_COMMUNITY)
Admission: EM | Admit: 2024-01-14 | Discharge: 2024-01-14 | Discharge status: Against Medical Advice | Attending: Emergency Medicine | Admitting: Emergency Medicine

## 2024-01-14 ENCOUNTER — Emergency Department (HOSPITAL_COMMUNITY)

## 2024-01-14 DIAGNOSIS — I10 Essential (primary) hypertension: Secondary | ICD-10-CM | POA: Insufficient documentation

## 2024-01-14 DIAGNOSIS — W010XXA Fall on same level from slipping, tripping and stumbling without subsequent striking against object, initial encounter: Secondary | ICD-10-CM | POA: Diagnosis not present

## 2024-01-14 DIAGNOSIS — S60512A Abrasion of left hand, initial encounter: Secondary | ICD-10-CM | POA: Diagnosis not present

## 2024-01-14 DIAGNOSIS — Z23 Encounter for immunization: Secondary | ICD-10-CM | POA: Insufficient documentation

## 2024-01-14 DIAGNOSIS — S60511A Abrasion of right hand, initial encounter: Secondary | ICD-10-CM | POA: Diagnosis not present

## 2024-01-14 DIAGNOSIS — S01112A Laceration without foreign body of left eyelid and periocular area, initial encounter: Secondary | ICD-10-CM | POA: Diagnosis not present

## 2024-01-14 DIAGNOSIS — R531 Weakness: Secondary | ICD-10-CM | POA: Diagnosis not present

## 2024-01-14 DIAGNOSIS — W01198A Fall on same level from slipping, tripping and stumbling with subsequent striking against other object, initial encounter: Secondary | ICD-10-CM | POA: Diagnosis not present

## 2024-01-14 DIAGNOSIS — Z5321 Procedure and treatment not carried out due to patient leaving prior to being seen by health care provider: Secondary | ICD-10-CM | POA: Diagnosis not present

## 2024-01-14 DIAGNOSIS — W19XXXA Unspecified fall, initial encounter: Secondary | ICD-10-CM | POA: Diagnosis not present

## 2024-01-14 DIAGNOSIS — S0993XA Unspecified injury of face, initial encounter: Secondary | ICD-10-CM | POA: Diagnosis not present

## 2024-01-14 DIAGNOSIS — Z79899 Other long term (current) drug therapy: Secondary | ICD-10-CM | POA: Diagnosis not present

## 2024-01-14 DIAGNOSIS — S0181XA Laceration without foreign body of other part of head, initial encounter: Secondary | ICD-10-CM | POA: Diagnosis not present

## 2024-01-14 MED ORDER — TETANUS-DIPHTH-ACELL PERTUSSIS 5-2-15.5 LF-MCG/0.5 IM SUSP
0.5000 mL | Freq: Once | INTRAMUSCULAR | Status: AC
  Administered 2024-01-14: 0.5 mL via INTRAMUSCULAR
  Filled 2024-01-14: qty 0.5

## 2024-01-14 NOTE — ED Triage Notes (Signed)
 BIB [facility] from [facility] s/p mechanical fall after she tripped. No LOC, blood thinners, dizziness, light headedness. No h/o DM. VSS: 140/80, HR 75, RR 18, SPO2 97% RA. A&Ox4. Hit head. Laceration noted above L eye. Bleeding controlled with coban dressing. Abrasions noted to bilateral palms. 5/5 strength. Denies neck or back pain. Endorses forehead soreness. Alert, NAD, calm, interactive, resps e/u, speaking in clear complete sentences, skin W&D, sitting in w/c.

## 2024-01-14 NOTE — Discharge Instructions (Signed)
 You were seen in the Emergency Department for head injury Your CAT scan of your head and face earlier did not show any traumatic findings but did show redemonstration of the meningioma which was seen back in 2017 Have your primary care doctor follow-up on this We repaired your small laceration above the left brow with skin glue Keep this clean and dry for the next 24 hours This will heal on its own.  Try to protect it from the sun Return to the emergency room for repeated falls Take Tylenol  for aches and pains

## 2024-01-14 NOTE — ED Provider Notes (Signed)
 [place] EMERGENCY DEPARTMENT AT [facility] Provider Note   CSN: [id number] Arrival date & time: 01/14/24  1854     Patient presents with: Savannah Diaz is a 89 y.o. female.  With a history of hypertension osteoarthritis who presents to ED after fall.  Patient fell around noon today tripping over a curb on her way to the optometry office.  Positive head strike no LOC.  Landed on outstretched arms.  Went to [facility] was seen in triage CT head maxillofacial was taken which were negative for acute traumatic findings but wait was too long for her to get a room and she came here.  Mild headache discomfort over both knees with some bruising no other complaints.  No neck pain chest pain shortness of breath abdominal pain.  Able to walk well.  No anticoagulation.    Fall       Prior to Admission medications   Medication Sig Start Date End Date Taking? Authorizing Provider  amLODipine  (NORVASC ) 10 MG tablet TAKE ONE (1) TABLET BY MOUTH EVERY DAY 11/20/23   Charlanne Fredia LITTIE, MD  atenolol  (TENORMIN ) 50 MG tablet TAKE 1 TABLET BY MOUTH EVERY DAY 12/11/23   Gupta, Anjali L, MD  Cholecalciferol (VITAMIN D3) 25 MCG (1000 UT) CAPS Take by mouth daily.    [provider]  LORazepam  (ATIVAN ) 0.5 MG tablet Take 1 tablet (0.5 mg total) by mouth 2 (two) times daily as needed for anxiety. 12/25/23   Gupta, Anjali L, MD  melatonin 3 MG TABS tablet Take 3 mg by mouth at bedtime as needed.    [provider]  mirtazapine  (REMERON ) 30 MG tablet TAKE ONE (1) TABLET BY MOUTH EVERY NIGHT AT BEDTIME 09/04/23   Charlanne Fredia LITTIE, MD  pantoprazole  sodium (PROTONIX ) 40 mg Take 40 mg by mouth daily. 06/17/23   Charlanne Fredia LITTIE, MD  predniSONE  (DELTASONE ) 5 MG tablet Take 0.5 tablets (2.5 mg total) by mouth daily with breakfast. 12/25/23   Charlanne Fredia LITTIE, MD  Psyllium (METAMUCIL 4 IN 1 FIBER) 25 % PACK 1 packet with 8 ounces of liquid as needed Orally Once a day for 30 day(s)     [provider]  sertraline  (ZOLOFT ) 20 MG/ML concentrated solution TAKE ONE and Half TEASPOONFUL (7.5ML) BY MOUTH ONCE DAILY 12/02/23   Charlanne Fredia LITTIE, MD  traZODone  (DESYREL ) 50 MG tablet TAKE ONE (1) TABLET BY MOUTH EVERY NIGHT AT BEDTIME 09/24/23   Darlean Maus, NP  vitamin B-12 (CYANOCOBALAMIN ) 1000 MCG tablet Take 1 tablet (1,000 mcg total) by mouth daily. 03/28/21   Avram Lupita BRAVO, MD    Allergies: Lactose intolerance (gi), Lactulose, Lactose, and Sulfa antibiotics    Review of Systems  Updated Vital Signs BP (!) 158/88   Pulse 84   Temp 98 F (36.7 C)   Resp 15   SpO2 100%   Physical Exam Vitals and nursing note reviewed.  HENT:     Head:     Comments: Small punctate laceration 0.25 cm with surrounding early ecchymosis over superior left brow Eyes:     Extraocular Movements: Extraocular movements intact.     Pupils: Pupils are equal, round, and reactive to light.  Cardiovascular:     Rate and Rhythm: Normal rate and regular rhythm.  Pulmonary:     Effort: Pulmonary effort is normal.     Breath sounds: Normal breath sounds.  Abdominal:     Palpations: Abdomen is soft.     Tenderness:  There is no abdominal tenderness.  Musculoskeletal:     Cervical back: Neck supple. No tenderness.     Comments: Bruising over both palms both knees no tenderness to palpation no bony deformity  Skin:    General: Skin is warm and dry.  Neurological:     Mental Status: She is alert.  Psychiatric:        Mood and Affect: Mood normal.     (all labs ordered are listed, but only abnormal results are displayed) Labs Reviewed - No data to display  EKG: None  Radiology: CT Head Wo Contrast Result Date: 01/14/2024 EXAM: CT HEAD WITHOUT CONTRAST 01/14/2024 03:13:00 PM TECHNIQUE: CT of the head was performed without the administration of intravenous contrast. Automated exposure control, iterative reconstruction, and/or weight based adjustment of the mA/kV was utilized to  reduce the radiation dose to as low as reasonably achievable. COMPARISON: MRI brain 04/22/2015. CLINICAL HISTORY: Head trauma, minor (Age >= 65y). FINDINGS: BRAIN AND VENTRICLES: No acute hemorrhage. No evidence of acute infarct. No hydrocephalus. There is an overall similar 1.2 x 0.6 cm isoattenuating extra-axial mass along the left frontal convexity (series 2, image 14), most likely representing meningioma. No significant mass effect or midline shift. There is overall similar, when accounting for differences in modality, moderate-to-severe scattered white matter hypodensities, which are nonspecific but most commonly represent chronic microvascular ischemic changes. There is mild generalized parenchymal volume loss. Interval age indeterminate, though probably, chronic lacunar infarctions within the bilateral caudate heads. SOFT TISSUES AND SKULL: Mild infiltration of the left superior periorbital scalp soft tissues in keeping with contusive changes. No skull fracture. See separately dictated same day CT maxillofacial for calvarial fracture. IMPRESSION: 1. No acute intracranial hemorrhage or calvarial fracture. 2. Mild infiltration of the left superior periorbital scalp soft tissues, consistent with contusive changes. 3. Overall similar 1.2 x 0.6 cm left frontal convexity extra-axial mass most likely representing a meningioma. Electronically signed by: prentice spade 01/14/2024 03:41 PM EST RP Workstation: [id number]   CT Maxillofacial Wo Contrast Result Date: 01/14/2024 EXAM: CT OF THE FACE WITHOUT CONTRAST 01/14/2024 03:13:00 PM TECHNIQUE: CT of the face was performed without the administration of intravenous contrast. Multiplanar reformatted images are provided for review. Automated exposure control, iterative reconstruction, and/or weight based adjustment of the mA/kV was utilized to reduce the radiation dose to as low as reasonably achievable. COMPARISON: None available. CLINICAL HISTORY: Facial trauma,  blunt. FINDINGS: FACIAL BONES: There is no evidence of acute maxillofacial fracture. No mandibular condyle dislocation. There are degenerative changes of both temporomandibular joints. Dental amalgam involves multiple maxillary and mandibular teeth. There is a periapical lucency of the right maxillary 1st molar. There is a screw within the left posterior body/angle of the mandible. The screw protrudes from the inner cortex of the mandible by approximately 5 mm, involving the adjacent soft tissues of the oral cavity. Recommend correlation with history of prior surgical and dental procedures in this region. No suspicious bone lesion. ORBITS: Globes are intact. No acute traumatic injury. No inflammatory change. Bilateral lens replacements. SINUSES AND MASTOIDS: Mucosal thickening is present in the alveolar recesses of both maxillary sinuses, which may be odontogenic in origin. Concha bullosa of the bilateral middle turbinates. There is hypertrophy of the bilateral inferior turbinates. No air fluid levels in the sinuses. Narrowing at the left internal nasal valve. No acute abnormality of the mastoids. SOFT TISSUES: The screw protruding from the inner cortex of the left mandible involves the adjacent soft tissues of the oral cavity.  No other acute soft tissue abnormality. IMPRESSION: 1. No acute maxillofacial fracture or mandibular condyle dislocation. 2. Screw within the left posterior body/angle of the mandible protruding approximately 5 mm into adjacent soft tissues, likely postsurgical. Recommend correlation with dental/surgical history. 3. Degenerative changes of both temporomandibular joints. 4. Mucosal thickening in the alveolar recesses of both maxillary sinuses, possibly odontogenic in origin. No air-fluid levels. Electronically signed by: Donnice Mania MD 01/14/2024 03:28 PM EST RP Workstation: [id number]     .Laceration Repair  Date/Time: 01/14/2024 8:27 PM  Performed by: Pamella Ozell LABOR,  DO Authorized by: Pamella Ozell LABOR, DO   Consent:    Consent obtained:  Verbal   Consent given by:  Patient   Risks discussed:  Infection, pain and poor cosmetic result Universal protocol:    Immediately prior to procedure, a time out was called: yes     Patient identity confirmed:  Verbally with patient Anesthesia:    Anesthesia method:  None Laceration details:    Length (cm):  0.3   Depth (mm):  0.4 Treatment:    Area cleansed with:  Saline   Amount of cleaning:  Extensive   Irrigation solution:  Sterile saline Skin repair:    Repair method:  Tissue adhesive Approximation:    Approximation:  Close Repair type:    Repair type:  Simple Post-procedure details:    Dressing:  Open (no dressing)    Medications Ordered in the ED  Tdap (ADACEL) injection 0.5 mL (0.5 mLs Intramuscular Given 01/14/24 2024)                                    Medical Decision Making 89 year old female with history as above presents to the ED after fall with head injury.  CT head maxillofacial were negative at [facility] but wait was too long to get a room.  I did discuss with her and her daughter the incidental finding of left frontal meningioma which appears stable from 2017 MRI comparison.  Laceration repaired with skin glue.  Tdap updated.  Appropriate for discharge  Risk Prescription drug management.        Final diagnoses:  Facial laceration, initial encounter    ED Discharge Orders     None          Pamella Ozell LABOR, DO 01/14/24 2029

## 2024-01-14 NOTE — ED Notes (Signed)
 Pt in vehicle and leaving at this time.

## 2024-01-14 NOTE — ED Triage Notes (Signed)
 Fall- mechanical From [facility]- independent living Was at [facility] left due to wait time Happened around 12 pm Wound on head Ct done at [facility]

## 2024-01-14 NOTE — ED Notes (Signed)
 Pt daughter to the desk and states they are leaving and probably going to [facility]. Advised, that I couldn't give them information on the wait time there, nor could I advise them to leave.

## 2024-01-14 NOTE — ED Provider Triage Note (Signed)
 Emergency Medicine Provider Triage Evaluation Note  Savannah Diaz , a 89 y.o. female  was evaluated in triage.  Pt complains of mechanical fall.  Review of Systems  Positive: Laceration above left eye, abrasions to bilateral palms Negative: LOC, blood thinners, dizziness, lightheadedness  Physical Exam  BP (!) 163/94   Pulse 72   Temp 99.1 F (37.3 C) (Oral)   Resp 16   Wt 52.2 kg   SpO2 97%   BMI 22.46 kg/m  Gen:   Awake, no distress   Resp:  Normal effort  MSK:   Moves extremities without difficulty  Other:  Laceration above left eye, bilateral palm abrasions  Medical Decision Making  Medically screening exam initiated at 2:52 PM.  Appropriate orders placed.  Savannah Diaz was informed that the remainder of the evaluation will be completed by another provider, this initial triage assessment does not replace that evaluation, and the importance of remaining in the ED until their evaluation is complete.  CT head and maxillofacial ordered   Francis Ileana SAILOR, PA-C 01/14/24 1453

## 2024-01-19 DIAGNOSIS — R319 Hematuria, unspecified: Secondary | ICD-10-CM | POA: Diagnosis not present

## 2024-01-19 DIAGNOSIS — N390 Urinary tract infection, site not specified: Secondary | ICD-10-CM | POA: Diagnosis not present

## 2024-01-20 ENCOUNTER — Non-Acute Institutional Stay: Payer: Self-pay | Admitting: Internal Medicine

## 2024-01-20 ENCOUNTER — Encounter: Payer: Self-pay | Admitting: Internal Medicine

## 2024-01-20 VITALS — BP 112/88 | HR 72 | Temp 98.2°F | Resp 18 | Ht 60.0 in | Wt 114.9 lb

## 2024-01-20 DIAGNOSIS — R1310 Dysphagia, unspecified: Secondary | ICD-10-CM

## 2024-01-20 DIAGNOSIS — R319 Hematuria, unspecified: Secondary | ICD-10-CM | POA: Diagnosis not present

## 2024-01-20 DIAGNOSIS — M545 Low back pain, unspecified: Secondary | ICD-10-CM

## 2024-01-20 DIAGNOSIS — M810 Age-related osteoporosis without current pathological fracture: Secondary | ICD-10-CM | POA: Diagnosis not present

## 2024-01-20 DIAGNOSIS — F330 Major depressive disorder, recurrent, mild: Secondary | ICD-10-CM | POA: Diagnosis not present

## 2024-01-20 DIAGNOSIS — E7849 Other hyperlipidemia: Secondary | ICD-10-CM

## 2024-01-20 DIAGNOSIS — E538 Deficiency of other specified B group vitamins: Secondary | ICD-10-CM

## 2024-01-20 DIAGNOSIS — W19XXXD Unspecified fall, subsequent encounter: Secondary | ICD-10-CM | POA: Diagnosis not present

## 2024-01-20 DIAGNOSIS — G8929 Other chronic pain: Secondary | ICD-10-CM | POA: Diagnosis not present

## 2024-01-20 DIAGNOSIS — M353 Polymyalgia rheumatica: Secondary | ICD-10-CM

## 2024-01-20 MED ORDER — VITAMIN B-12 1000 MCG PO TABS: 500.0000 ug | ORAL_TABLET | Freq: Every day | ORAL | Status: AC

## 2024-01-20 NOTE — Patient Instructions (Signed)
 Labs will be done on February 2nd at [facility] at 7:45 am

## 2024-01-20 NOTE — Progress Notes (Signed)
 Location:  [facility]   Place of Service:   Clinic  Provider:   Code Status:  Goals of Care:     01/14/2024    7:08 PM  Advanced Directives  Does Patient Have a Medical Advance Directive? No  Would patient like information on creating a medical advance directive? No - Patient declined     Chief Complaint  Patient presents with   Medical Management of Chronic Issues    Four Months Follow-up with labs     HPI: Patient is a 89 y.o. female seen today for an acute visit for Follow up after a fall and UTI  Came with her son Lives in [facility] [place]  Acute issues Mechanical fall in 01/14/24 CT scan Was negative Did get Facial Laceration  UTI on 11/25 /25 Saw in [facility] Got Cipro  Symptoms better Culture pending Did have hematuria  History of PMR Now off prednisone   Depression with anxiety Symptoms are better on increased dose of Zoloft   History of food impaction on 3/25 leading EGD now on pantoprazole    Past Medical History:  Diagnosis Date   Anxiety    Arthritis    qwhere; but it doesn't hurt (05/12/2012)   Aspiration pneumonia (HCC) 03/21/2016   Cataract    left   Compression fracture of first lumbar vertebra (HCC) 07/2016   superior endplate   Depression    Dysphagia    have had it for 7 yr; got worse 4 days ago (05/12/2012)   Dysphagia, pharyngoesophageal phase - suspected 05/10/2012   GERD (gastroesophageal reflux disease)    Hypertension    Insomnia    Lumbago with sciatica, right side    Migraines    menopause cured them (05/12/2012)   Osteoporosis    Stage 3 chronic kidney disease (HCC)    Uterine prolapse     Past Surgical History:  Procedure Laterality Date   CATARACT EXTRACTION W/ INTRAOCULAR LENS IMPLANT Left ? 2010   ESOPHAGEAL MANOMETRY N/A 11/08/2018   Procedure: ESOPHAGEAL MANOMETRY (EM);  Surgeon: Avram Lupita BRAVO, MD;  Location: [facility];  Service: Endoscopy;  Laterality: N/A;   ESOPHAGOGASTRODUODENOSCOPY N/A 05/10/2012   Procedure:  ESOPHAGOGASTRODUODENOSCOPY (EGD);  Surgeon: Gordy CHRISTELLA Starch, MD;  Location: [facility];  Service: Gastroenterology;  Laterality: N/A;   ESOPHAGOGASTRODUODENOSCOPY N/A 04/26/2023   Procedure: ESOPHAGOGASTRODUODENOSCOPY (EGD);  Surgeon: Nandigam, Kavitha V, MD;  Location: [facility] ENDOSCOPY;  Service: Gastroenterology;  Laterality: N/A;   FOREIGN BODY REMOVAL  04/26/2023   Procedure: FOREIGN BODY REMOVAL;  Surgeon: Shila Gustav GAILS, MD;  Location: [facility] ENDOSCOPY;  Service: Gastroenterology;;   TONSILLECTOMY  1940's    Allergies  Allergen Reactions   Lactose Intolerance (Gi) Other (See Comments)    Gi tract issues   Lactulose Other (See Comments)    Gi tract issues   Lactose     Other Reaction(s): GI   Sulfa Antibiotics Hives    Childhood allergy     Outpatient Encounter Medications as of 01/20/2024  Medication Sig   amLODipine  (NORVASC ) 10 MG tablet TAKE ONE (1) TABLET BY MOUTH EVERY DAY   atenolol  (TENORMIN ) 50 MG tablet TAKE 1 TABLET BY MOUTH EVERY DAY   Cholecalciferol (VITAMIN D3) 25 MCG (1000 UT) CAPS Take by mouth daily.   LORazepam  (ATIVAN ) 0.5 MG tablet Take 1 tablet (0.5 mg total) by mouth 2 (two) times daily as needed for anxiety.   mirtazapine  (REMERON ) 30 MG tablet TAKE ONE (1) TABLET BY MOUTH EVERY NIGHT AT BEDTIME   pantoprazole  sodium (PROTONIX )  40 mg Take 40 mg by mouth daily.   Psyllium (METAMUCIL 4 IN 1 FIBER) 25 % PACK 1 packet with 8 ounces of liquid as needed Orally Once a day for 30 day(s)   sertraline  (ZOLOFT ) 20 MG/ML concentrated solution TAKE ONE and Half TEASPOONFUL (7.5ML) BY MOUTH ONCE DAILY   traZODone  (DESYREL ) 50 MG tablet TAKE ONE (1) TABLET BY MOUTH EVERY NIGHT AT BEDTIME   [DISCONTINUED] vitamin B-12 (CYANOCOBALAMIN ) 1000 MCG tablet Take 1 tablet (1,000 mcg total) by mouth daily.   cyanocobalamin  (VITAMIN B12) 1000 MCG tablet Take 0.5 tablets (500 mcg total) by mouth daily.   melatonin 3 MG TABS tablet Take 3 mg by mouth at bedtime as needed. (Patient not  taking: Reported on 01/20/2024)   predniSONE  (DELTASONE ) 5 MG tablet Take 0.5 tablets (2.5 mg total) by mouth daily with breakfast. (Patient not taking: Reported on 01/20/2024)   No facility-administered encounter medications on file as of 01/20/2024.    Review of Systems:  Review of Systems  Constitutional:  Negative for activity change and appetite change.  HENT: Negative.    Respiratory:  Negative for cough and shortness of breath.   Cardiovascular:  Negative for leg swelling.  Gastrointestinal:  Negative for constipation.  Genitourinary: Negative.   Musculoskeletal:  Positive for gait problem. Negative for arthralgias and myalgias.  Skin: Negative.   Neurological:  Negative for dizziness and weakness.  Psychiatric/Behavioral:  Negative for confusion, dysphoric mood and sleep disturbance. The patient is nervous/anxious.     Health Maintenance  Topic Date Due   Medicare Annual Wellness (AWV)  03/08/2024   COVID-19 Vaccine [phone] - 2025-26 season) 09/26/2024 (Originally 12/05/2023)   DTaP/Tdap/Td (3 - Td or Tdap) 01/13/2034   Pneumococcal Vaccine: 50+ Years  Completed   Influenza Vaccine  Completed   Bone Density Scan  Completed   Zoster Vaccines- Shingrix  Completed   Meningococcal B Vaccine  Aged Out    Physical Exam: Vitals:   01/20/24 1048  BP: 112/88  Pulse: 72  Resp: 18  Temp: 98.2 F (36.8 C)  SpO2: 98%  Weight: 114 lb 14.4 oz (52.1 kg)  Height: 5' (1.524 m)   Body mass index is 22.44 kg/m. Physical Exam Vitals reviewed.  Constitutional:      Appearance: Normal appearance.  HENT:     Head: Normocephalic.     Comments: Bruise and Swelling on Left Side of her Face    Nose: Nose normal.     Mouth/Throat:     Mouth: Mucous membranes are moist.     Pharynx: Oropharynx is clear.  Eyes:     Pupils: Pupils are equal, round, and reactive to light.  Cardiovascular:     Rate and Rhythm: Normal rate and regular rhythm.     Pulses: Normal pulses.     Heart  sounds: Normal heart sounds. No murmur heard. Pulmonary:     Effort: Pulmonary effort is normal.     Breath sounds: Normal breath sounds.  Abdominal:     General: Abdomen is flat. Bowel sounds are normal.     Palpations: Abdomen is soft.  Musculoskeletal:        General: No swelling.     Cervical back: Neck supple.  Skin:    General: Skin is warm.  Neurological:     General: No focal deficit present.     Mental Status: She is alert and oriented to person, place, and time.  Psychiatric:        Mood and Affect:  Mood normal.        Thought Content: Thought content normal.     Labs reviewed: Basic Metabolic Panel: Recent Labs    02/02/23 0800 04/26/23 0948 01/05/24 0930  NA 142 142 140  K 3.7 3.2* 4.1  CL 105 105 102  CO2 30 27 25*  GLUCOSE 85 136*  --   BUN 20 19 21   CREATININE 0.87 0.83 1.0  CALCIUM  9.2 9.3  --   MG 1.9  --   --    Liver Function Tests: Recent Labs    01/05/24 0930  AST 15  ALT 16  ALKPHOS 43   No results for input(s): LIPASE, AMYLASE in the last 8760 hours. No results for input(s): AMMONIA in the last 8760 hours. CBC: Recent Labs    04/26/23 0948  WBC 7.9  NEUTROABS 5.9  HGB 13.3  HCT 39.5  MCV 91.0  PLT 252   Lipid Panel: No results for input(s): CHOL, HDL, LDLCALC, TRIG, CHOLHDL, LDLDIRECT in the last 8760 hours. Lab Results  Component Value Date   HGBA1C 5.5 10/24/2021    Procedures since last visit: CT Head Wo Contrast Result Date: 01/14/2024 EXAM: CT HEAD WITHOUT CONTRAST 01/14/2024 03:13:00 PM TECHNIQUE: CT of the head was performed without the administration of intravenous contrast. Automated exposure control, iterative reconstruction, and/or weight based adjustment of the mA/kV was utilized to reduce the radiation dose to as low as reasonably achievable. COMPARISON: MRI brain 04/22/2015. CLINICAL HISTORY: Head trauma, minor (Age >= 65y). FINDINGS: BRAIN AND VENTRICLES: No acute hemorrhage. No evidence of  acute infarct. No hydrocephalus. There is an overall similar 1.2 x 0.6 cm isoattenuating extra-axial mass along the left frontal convexity (series 2, image 14), most likely representing meningioma. No significant mass effect or midline shift. There is overall similar, when accounting for differences in modality, moderate-to-severe scattered white matter hypodensities, which are nonspecific but most commonly represent chronic microvascular ischemic changes. There is mild generalized parenchymal volume loss. Interval age indeterminate, though probably, chronic lacunar infarctions within the bilateral caudate heads. SOFT TISSUES AND SKULL: Mild infiltration of the left superior periorbital scalp soft tissues in keeping with contusive changes. No skull fracture. See separately dictated same day CT maxillofacial for calvarial fracture. IMPRESSION: 1. No acute intracranial hemorrhage or calvarial fracture. 2. Mild infiltration of the left superior periorbital scalp soft tissues, consistent with contusive changes. 3. Overall similar 1.2 x 0.6 cm left frontal convexity extra-axial mass most likely representing a meningioma. Electronically signed by: prentice spade 01/14/2024 03:41 PM EST RP Workstation: [id number]   CT Maxillofacial Wo Contrast Result Date: 01/14/2024 EXAM: CT OF THE FACE WITHOUT CONTRAST 01/14/2024 03:13:00 PM TECHNIQUE: CT of the face was performed without the administration of intravenous contrast. Multiplanar reformatted images are provided for review. Automated exposure control, iterative reconstruction, and/or weight based adjustment of the mA/kV was utilized to reduce the radiation dose to as low as reasonably achievable. COMPARISON: None available. CLINICAL HISTORY: Facial trauma, blunt. FINDINGS: FACIAL BONES: There is no evidence of acute maxillofacial fracture. No mandibular condyle dislocation. There are degenerative changes of both temporomandibular joints. Dental amalgam involves multiple  maxillary and mandibular teeth. There is a periapical lucency of the right maxillary 1st molar. There is a screw within the left posterior body/angle of the mandible. The screw protrudes from the inner cortex of the mandible by approximately 5 mm, involving the adjacent soft tissues of the oral cavity. Recommend correlation with history of prior surgical and dental procedures in this region.  No suspicious bone lesion. ORBITS: Globes are intact. No acute traumatic injury. No inflammatory change. Bilateral lens replacements. SINUSES AND MASTOIDS: Mucosal thickening is present in the alveolar recesses of both maxillary sinuses, which may be odontogenic in origin. Concha bullosa of the bilateral middle turbinates. There is hypertrophy of the bilateral inferior turbinates. No air fluid levels in the sinuses. Narrowing at the left internal nasal valve. No acute abnormality of the mastoids. SOFT TISSUES: The screw protruding from the inner cortex of the left mandible involves the adjacent soft tissues of the oral cavity. No other acute soft tissue abnormality. IMPRESSION: 1. No acute maxillofacial fracture or mandibular condyle dislocation. 2. Screw within the left posterior body/angle of the mandible protruding approximately 5 mm into adjacent soft tissues, likely postsurgical. Recommend correlation with dental/surgical history. 3. Degenerative changes of both temporomandibular joints. 4. Mucosal thickening in the alveolar recesses of both maxillary sinuses, possibly odontogenic in origin. No air-fluid levels. Electronically signed by: Donnice Mania MD 01/14/2024 03:28 PM EST RP Workstation: [id number]    Assessment/Plan 1. Vitamin B 12 deficiency Level was high in Rheumatology Place  She has reduced her dose to 500 MCG  - cyanocobalamin  (VITAMIN B12) 1000 MCG tablet; Take 0.5 tablets (500 mcg total) by mouth daily.  2. Mild episode of recurrent major depressive disorder Doing well on Zoloft  and Remeron  And  Trazodone  Ativan   3. PMR (polymyalgia rheumatica) Now she is off Prednisone  Doing well  4. 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   5. Dysphagia, unspecified type Protonix   6. Age-related osteoporosis without current pathological fracture Reclast  per Rheumatology   7. Other hyperlipidemia   8. Fall, subsequent encounter (Primary) Suggested to [employer]  9 HTN Norvasc  and Tenormin  10 UTI Symptoms Improved on Cipro  Culture Pending Repeat UA in 3 months due to Hematuria  11 Stable Meningioma seen on CT scan  12 H/o Peripheral Neuropathy Has Seen Neurology Does not want to follow with anyone right now   Labs/tests ordered:  * No order type specified * Next appt:  03/30/2024

## 2024-01-24 ENCOUNTER — Encounter: Payer: Self-pay | Admitting: Internal Medicine

## 2024-01-27 ENCOUNTER — Other Ambulatory Visit: Payer: Self-pay | Admitting: Internal Medicine

## 2024-01-27 MED ORDER — LORAZEPAM 0.5 MG PO TABS: 0.5000 mg | ORAL_TABLET | Freq: Three times a day (TID) | ORAL | Status: DC | PRN

## 2024-02-04 ENCOUNTER — Telehealth

## 2024-02-04 ENCOUNTER — Telehealth: Admitting: Adult Health

## 2024-02-04 ENCOUNTER — Ambulatory Visit: Payer: Self-pay

## 2024-02-04 DIAGNOSIS — F419 Anxiety disorder, unspecified: Secondary | ICD-10-CM | POA: Diagnosis not present

## 2024-02-04 DIAGNOSIS — I10 Essential (primary) hypertension: Secondary | ICD-10-CM

## 2024-02-04 DIAGNOSIS — F5101 Primary insomnia: Secondary | ICD-10-CM | POA: Diagnosis not present

## 2024-02-04 MED ORDER — SERTRALINE HCL 20 MG/ML PO CONC: 200.0000 mg | Freq: Every day | ORAL | 0 refills | Status: DC

## 2024-02-04 MED ORDER — TRAZODONE HCL 50 MG PO TABS: 50.0000 mg | ORAL_TABLET | Freq: Every evening | ORAL | Status: DC | PRN

## 2024-02-04 MED ORDER — MIRTAZAPINE 15 MG PO TABS: 15.0000 mg | ORAL_TABLET | Freq: Every day | ORAL | 3 refills | Status: AC

## 2024-02-04 NOTE — Telephone Encounter (Signed)
 Doctor Charlanne, The patient is scheduled to see Monina today, 02/04/24, at 2:20 PM.

## 2024-02-04 NOTE — Telephone Encounter (Signed)
 Ok It is fine I didn't realize she has Appointment today. Thanks

## 2024-02-04 NOTE — Telephone Encounter (Signed)
 FYI Only or Action Required?: FYI only for provider: appointment scheduled on today vv with both .  Patient was last seen in primary care on 01/20/2024 by Charlanne Fredia CROME, MD.  Called Nurse Triage reporting Anxiety. Worsening - pt report nervousness, anxiety and nausea - feels better in the afternoon.  Symptoms began several years ago.  Interventions attempted: Prescription medications: Increased dosage.  Symptoms are: gradually worsening.  Triage Disposition: See Physician Within 24 Hours  Patient/caregiver understands and will follow disposition?: Yes                       Copied from CRM #[id number]. Topic: Clinical - Red Word Triage >> Feb 04, 2024 11:41 AM Mercer PEDLAR wrote: Red Word that prompted transfer to Nurse Triage: Had a fall two weeks ago, not feeling well mentally, feeling nauseous. Stated that peripheral neuropathy has gotten worse. Reason for Disposition  Patient sounds very upset or troubled to the triager  Answer Assessment - Initial Assessment Questions 1. CONCERN: Did anything happen that prompted you to call today?      Not feeling well 2. ANXIETY SYMPTOMS: Can you describe how you (your loved one; patient) have been feeling? (e.g., tense, restless, panicky, anxious, keyed up, overwhelmed, sense of impending doom).      Nervous, nausea, not eating 3. ONSET: How long have you been feeling this way? (e.g., hours, days, weeks)     A long time getting worse 4. SEVERITY: How would you rate the level of anxiety? (e.g., 0 - 10; or mild, moderate, severe).     moderate 5. FUNCTIONAL IMPAIRMENT: How have these feelings affected your ability to do daily activities? Have you had more difficulty than usual doing your normal daily activities? (e.g., getting better, same, worse; self-care, school, work, interactions)     Not eating  6. HISTORY: Have you felt this way before? Have you ever been diagnosed with an anxiety problem in the past?  (e.g., generalized anxiety disorder, panic attacks, PTSD). If Yes, ask: How was this problem treated? (e.g., medicines, counseling, etc.)     yes  8. TREATMENT:  What has been done so far to treat this anxiety? (e.g., medicines, relaxation strategies). What has helped?     Zoloft  and Lorazepam  12. OTHER SYMPTOMS: Do you have any other symptoms? (e.g., feeling depressed, trouble concentrating, trouble sleeping, trouble breathing, palpitations or fast heartbeat, chest pain, sweating, nausea, or diarrhea)       No eating feeling nervous  Protocols used: Anxiety and Panic Attack-A-AH

## 2024-02-04 NOTE — Telephone Encounter (Signed)
 Patient has an appointment scheduled in office with Monina for today 12/11 for evaluation.   Do you want her to also schedule an appointment with you for next week to follow up?  Please Advise.

## 2024-02-04 NOTE — Progress Notes (Signed)
 This service is provided via telemedicine  No vital signs collected/recorded due to the encounter was a telemedicine visit.   Location of patient (ex: home, work):  Home  Patient consents to a telephone visit: Yes  Location of the provider (ex: office, home):  [facility], Office   Name of any referring provider:  N/A  Names of all persons participating in the telemedicine service and their role in the encounter Roselyn Louder RMA, Patient, and Jereld Fredda Delude.  Time spent on call:  9 min with medical assistant      Virtual Visit via Video Note  I connected with Savannah Diaz on 02/04/2024 at  2:20 PM EST by a video enabled telemedicine application and verified that I am speaking with the correct person using two identifiers.  Patient Location: Home Provider Location: Office/Clinic  I discussed the limitations, risks, security, and privacy concerns of performing an evaluation and management service by video and the availability of in person appointments. I also discussed with the patient that there may be a patient responsible charge related to this service. The patient expressed understanding and agreed to proceed.  Subjective: PCP: Charlanne Fredia LITTIE, MD  Chief Complaint  Patient presents with   Depression   Anxiety   HPI Discussed the use of AI scribe software for clinical note transcription with the patient, who gave verbal consent to proceed.  Savannah Diaz is an 89 year old female with anxiety and depression who presents with increased nervousness.  She experiences increased nervousness and anxiety despite taking sertraline  150 mg daily, mirtazapine  30 mg at bedtime, and lorazepam  0.5 mg three times a day as needed. Despite medication, she continues to feel very nervous.  Approximately three weeks ago, she fell when her right foot stopped moving as she was getting up from a bench, hitting her head above her eyebrow and resulting in a  bruise. She was taken to the emergency room, where a complete x-ray of her brain and face was performed, and she was treated with glue for a cut. No fractures were sustained, but she had scrapes on her hands and knees. This incident has left her feeling shaken. No dizziness at the time of the fall. No recent falls since 2018 prior to the recent incident.  Her current medications include trazodone  50 mg at bedtime for insomnia and atenolol  50 mg daily for hypertension. She has difficulty swallowing pills due to issues in her esophagus, hence the use of liquid formulations where possible.    ROS: Per HPI Current Medications[1]  Observations/Objective: There were no vitals filed for this visit. Physical Exam  Assessment and Plan:  1. Anxiety (Primary) -  Persistent nervousness noted. -  Current medication regimen includes sertraline , lorazepam , and mirtazapine . Fall risk limits lorazepam  increase. - Increased sertraline  to 200 mg daily, monitor for grogginess. - Continue lorazepam  0.5 mg three times daily as needed. -  decrease Remeron  from 30 mg at bedtime to Remeron  15 mg at bedtime - sertraline  (ZOLOFT ) 20 MG/ML concentrated solution; Take 10 mLs (200 mg total) by mouth daily. Take 2 teaspoons (10 ml)  =  200 mg by mouth daily  Dispense: 300 mL; Refill: 0 - Ambulatory referral to Psychiatry - mirtazapine  (REMERON ) 15 MG tablet; Take 1 tablet (15 mg total) by mouth at bedtime.  Dispense: 30 tablet; Refill: 3  2. Primary hypertension -  well-controlled with tenormin . - Continue tenormin  50 mg daily.   3. Primary insomnia -  Use trazodone  as needed for sleep. -  decrease Remeron  from 30 mg at bedtime to Remeron  15 mg at bedtime - traZODone  (DESYREL ) 50 MG tablet; Take 1 tablet (50 mg total) by mouth at bedtime as needed for sleep. - mirtazapine  (REMERON ) 15 MG tablet; Take 1 tablet (15 mg total) by mouth at bedtime.  Dispense: 30 tablet; Refill: 3     Follow Up Instructions: Return  if symptoms worsen or fail to improve.   I discussed the assessment and treatment plan with the patient. The patient was provided an opportunity to ask questions, and all were answered. The patient agreed with the plan and demonstrated an understanding of the instructions.   The patient was advised to call back or seek an in-person evaluation if the symptoms worsen or if the condition fails to improve as anticipated.  The above assessment and management plan was discussed with the patient. The patient verbalized understanding of and has agreed to the management plan.   Akela Pocius Medina-Vargas, NP     [1]  Current Outpatient Medications:    amLODipine  (NORVASC ) 10 MG tablet, TAKE ONE (1) TABLET BY MOUTH EVERY DAY, Disp: 90 tablet, Rfl: 1   atenolol  (TENORMIN ) 50 MG tablet, TAKE 1 TABLET BY MOUTH EVERY DAY, Disp: 90 tablet, Rfl: 1   Cholecalciferol (VITAMIN D3) 25 MCG (1000 UT) CAPS, Take by mouth daily., Disp: , Rfl:    cyanocobalamin  (VITAMIN B12) 1000 MCG tablet, Take 0.5 tablets (500 mcg total) by mouth daily., Disp: , Rfl:    LORazepam  (ATIVAN ) 0.5 MG tablet, Take 1 tablet (0.5 mg total) by mouth 3 (three) times daily as needed for anxiety., Disp: , Rfl:    mirtazapine  (REMERON ) 15 MG tablet, Take 1 tablet (15 mg total) by mouth at bedtime., Disp: 30 tablet, Rfl: 3   pantoprazole  sodium (PROTONIX ) 40 mg, Take 40 mg by mouth daily., Disp: 90 each, Rfl: 3   Psyllium (METAMUCIL 4 IN 1 FIBER) 25 % PACK, 1 packet with 8 ounces of liquid as needed Orally Once a day for 30 day(s), Disp: , Rfl:    sertraline  (ZOLOFT ) 20 MG/ML concentrated solution, Take 10 mLs (200 mg total) by mouth daily. Take 2 teaspoons (10 ml)  =  200 mg by mouth daily, Disp: 300 mL, Rfl: 0   traZODone  (DESYREL ) 50 MG tablet, Take 1 tablet (50 mg total) by mouth at bedtime as needed for sleep., Disp: , Rfl:

## 2024-02-04 NOTE — Telephone Encounter (Signed)
 Forwarded to Alondra, Admin, to schedule an appointment.

## 2024-02-04 NOTE — Telephone Encounter (Signed)
 Called [facility] to set up patient visit online instead of in office as requested.

## 2024-02-04 NOTE — Telephone Encounter (Signed)
 Add her to my schedule for next week

## 2024-02-06 ENCOUNTER — Other Ambulatory Visit: Payer: Self-pay | Admitting: Internal Medicine

## 2024-02-06 MED ORDER — LORAZEPAM 0.5 MG PO TABS: 0.5000 mg | ORAL_TABLET | Freq: Three times a day (TID) | ORAL | 0 refills | Status: DC | PRN

## 2024-02-08 ENCOUNTER — Telehealth: Payer: Self-pay

## 2024-02-08 NOTE — Telephone Encounter (Signed)
 Incoming fax received on patient with the following message regarding Lorazepam :

## 2024-02-09 NOTE — Telephone Encounter (Signed)
Please advise if this is ok to fill early

## 2024-02-10 ENCOUNTER — Encounter: Payer: Self-pay | Admitting: Internal Medicine

## 2024-02-10 ENCOUNTER — Non-Acute Institutional Stay: Admitting: Internal Medicine

## 2024-02-10 VITALS — BP 122/80 | HR 65 | Temp 97.6°F | Resp 18 | Ht 60.0 in | Wt 114.0 lb

## 2024-02-10 DIAGNOSIS — R319 Hematuria, unspecified: Secondary | ICD-10-CM | POA: Diagnosis not present

## 2024-02-10 DIAGNOSIS — F419 Anxiety disorder, unspecified: Secondary | ICD-10-CM

## 2024-02-10 DIAGNOSIS — I10 Essential (primary) hypertension: Secondary | ICD-10-CM

## 2024-02-10 DIAGNOSIS — E538 Deficiency of other specified B group vitamins: Secondary | ICD-10-CM

## 2024-02-10 DIAGNOSIS — F5101 Primary insomnia: Secondary | ICD-10-CM | POA: Diagnosis not present

## 2024-02-10 DIAGNOSIS — G609 Hereditary and idiopathic neuropathy, unspecified: Secondary | ICD-10-CM

## 2024-02-10 NOTE — Telephone Encounter (Signed)
 Charlanne Fredia CROME, MD to Me (Selected Message)     02/10/24 10:44 AM Yes it is fine to fill up. She is coming to see me today.    I left a detailed message for the pharmacy informing them ok to fill early

## 2024-02-10 NOTE — Patient Instructions (Addendum)
 You can try OTC Pepcid  AC in Night for Morning nausea Come and get your Urine cup before the Lab day

## 2024-02-14 NOTE — Progress Notes (Unsigned)
 "  Location: [facility] of Service:  Clinic (12)  Provider:   Code Status:  Goals of Care:     01/14/2024    7:08 PM  Advanced Directives  Does Patient Have a Medical Advance Directive? No  Would patient like information on creating a medical advance directive? No - Patient declined     Chief Complaint  Patient presents with   Medical Management of Chronic Issues    Depression; wants to talk with Dr. Charlanne    HPI: Patient is a 89 y.o. female seen today for an acute visit for follow-up of her depression  Lives in [place] [facility] [place]    Discussed the use of AI scribe software for clinical note transcription with the patient, who gave verbal consent to proceed.  History of Present Illness   Savannah Diaz is an 89 year old female who presents for medication management and follow-up.  Depression and Anxiety Seen By Savannah Diaz  sertraline  increased to 200 mg daily, mirtazapine  reduced to 15 mg nightly, and trazodone  changed to as-needed use. She takes lorazepam  two tablets in the morning and one at night and is worried she will run out before the next refill. Mornings are difficult with nausea and stomach discomfort, which she initially thought were panic attacks.   Peripheral Neuropathy She has chronic peripheral neuropathy with tingling and a sensation of altered feeling in her feet. She also feels unsteady, like on a rocking boat, and recently developed worsening balance that led to a fall. She is doing balance exercises and has started using a cane.  H/o UTI with Hematuria She recently had a urinary tract infection with hematuria but no pain or urgency. Urgent care documented dark urine. She is increasing water intake and has a follow-up urine test planned in February to recheck for blood.  Her sleep is fragmented. She often wakes around 4 AM and sometimes cannot fall back asleep. She is going to bed earlier and feels tired, which she attributes to her current medications.       History of PMR Now off prednisone  Past Medical History:  Diagnosis Date   Anxiety    Arthritis    qwhere; but it doesn't hurt (05/12/2012)   Aspiration pneumonia (HCC) 03/21/2016   Cataract    left   Compression fracture of first lumbar vertebra (HCC) 07/2016   superior endplate   Depression    Dysphagia    have had it for 7 yr; got worse 4 days ago (05/12/2012)   Dysphagia, pharyngoesophageal phase - suspected 05/10/2012   GERD (gastroesophageal reflux disease)    Hypertension    Insomnia    Lumbago with sciatica, right side    Migraines    menopause cured them (05/12/2012)   Osteoporosis    Stage 3 chronic kidney disease (HCC)    Uterine prolapse     Past Surgical History:  Procedure Laterality Date   CATARACT EXTRACTION W/ INTRAOCULAR LENS IMPLANT Left ? 2010   ESOPHAGEAL MANOMETRY N/A 11/08/2018   Procedure: ESOPHAGEAL MANOMETRY (EM);  Surgeon: Avram Lupita BRAVO, MD;  Location: [facility];  Service: Endoscopy;  Laterality: N/A;   ESOPHAGOGASTRODUODENOSCOPY N/A 05/10/2012   Procedure: ESOPHAGOGASTRODUODENOSCOPY (EGD);  Surgeon: Gordy CHRISTELLA Starch, MD;  Location: [facility];  Service: Gastroenterology;  Laterality: N/A;   ESOPHAGOGASTRODUODENOSCOPY N/A 04/26/2023   Procedure: ESOPHAGOGASTRODUODENOSCOPY (EGD);  Surgeon: Nandigam, Kavitha V, MD;  Location: [facility] ENDOSCOPY;  Service: Gastroenterology;  Laterality: N/A;   FOREIGN BODY REMOVAL  04/26/2023  Procedure: FOREIGN BODY REMOVAL;  Surgeon: Nandigam, Kavitha V, MD;  Location: [facility] ENDOSCOPY;  Service: Gastroenterology;;   TONSILLECTOMY  1940's    Allergies[1]  Outpatient Encounter Medications as of 02/10/2024  Medication Sig   amLODipine  (NORVASC ) 10 MG tablet TAKE ONE (1) TABLET BY MOUTH EVERY DAY   atenolol  (TENORMIN ) 50 MG tablet TAKE 1 TABLET BY MOUTH EVERY DAY   Cholecalciferol (VITAMIN D3) 25 MCG (1000 UT) CAPS Take by mouth daily.   cyanocobalamin  (VITAMIN B12) 1000 MCG tablet Take 0.5 tablets (500 mcg total)  by mouth daily.   LORazepam  (ATIVAN ) 0.5 MG tablet Take 1 tablet (0.5 mg total) by mouth 3 (three) times daily as needed for anxiety.   mirtazapine  (REMERON ) 15 MG tablet Take 1 tablet (15 mg total) by mouth at bedtime.   pantoprazole  sodium (PROTONIX ) 40 mg Take 40 mg by mouth daily.   Psyllium (METAMUCIL 4 IN 1 FIBER) 25 % PACK 1 packet with 8 ounces of liquid as needed Orally Once a day for 30 day(s)   sertraline  (ZOLOFT ) 20 MG/ML concentrated solution Take 10 mLs (200 mg total) by mouth daily. Take 2 teaspoons (10 ml)  =  200 mg by mouth daily   traZODone  (DESYREL ) 50 MG tablet Take 1 tablet (50 mg total) by mouth at bedtime as needed for sleep.   No facility-administered encounter medications on file as of 02/10/2024.    Review of Systems:  Review of Systems  Constitutional:  Negative for activity change and appetite change.  HENT: Negative.    Respiratory:  Negative for cough and shortness of breath.   Cardiovascular:  Negative for leg swelling.  Gastrointestinal:  Negative for constipation.  Genitourinary: Negative.   Musculoskeletal:  Negative for arthralgias, gait problem and myalgias.  Skin: Negative.   Neurological:  Negative for dizziness and weakness.  Psychiatric/Behavioral:  Positive for dysphoric mood and sleep disturbance. Negative for confusion. The patient is nervous/anxious.     Health Maintenance  Topic Date Due   Medicare Annual Wellness (AWV)  03/08/2024   COVID-19 Vaccine [phone] - 2025-26 season) 09/26/2024 (Originally 12/05/2023)   DTaP/Tdap/Td (3 - Td or Tdap) 01/13/2034   Pneumococcal Vaccine: 50+ Years  Completed   Influenza Vaccine  Completed   Bone Density Scan  Completed   Zoster Vaccines- Shingrix  Completed   Meningococcal B Vaccine  Aged Out    Physical Exam: Vitals:   02/10/24 1100  BP: 122/80  Pulse: 65  Resp: 18  Temp: 97.6 F (36.4 C)  SpO2: 97%  Weight: 114 lb (51.7 kg)  Height: 5' (1.524 m)   Body mass index is 22.26  kg/m. Physical Exam Vitals reviewed.  Constitutional:      Appearance: Normal appearance.  HENT:     Head: Normocephalic.     Nose: Nose normal.     Mouth/Throat:     Mouth: Mucous membranes are moist.  Eyes:     Pupils: Pupils are equal, round, and reactive to light.  Cardiovascular:     Pulses: Normal pulses.  Neurological:     General: No focal deficit present.     Mental Status: She is alert and oriented to person, place, and time.  Psychiatric:        Mood and Affect: Mood normal.        Thought Content: Thought content normal.     Labs reviewed: Basic Metabolic Panel: Recent Labs    04/26/23 0948 01/05/24 0930  NA 142 140  K 3.2* 4.1  CL 105 102  CO2 27 25*  GLUCOSE 136*  --   BUN 19 21  CREATININE 0.83 1.0  CALCIUM  9.3  --    Liver Function Tests: Recent Labs    01/05/24 0930  AST 15  ALT 16  ALKPHOS 43   No results for input(s): LIPASE, AMYLASE in the last 8760 hours. No results for input(s): AMMONIA in the last 8760 hours. CBC: Recent Labs    04/26/23 0948  WBC 7.9  NEUTROABS 5.9  HGB 13.3  HCT 39.5  MCV 91.0  PLT 252   Lipid Panel: No results for input(s): CHOL, HDL, LDLCALC, TRIG, CHOLHDL, LDLDIRECT in the last 8760 hours. Lab Results  Component Value Date   HGBA1C 5.5 10/24/2021    Procedures since last visit: No results found.  Assessment/Plan 1. Anxiety (Primary) Doing well on increased dose of Zoloft  I have changed the prescription for lorazepam  3 times a day A psych referral was made but she is not sure if she will follow-up with them 2. Primary hypertension Controlled on amlodipine  and Tenormin   3. Primary insomnia She is on Remeron  and trazodone   4. Idiopathic peripheral neuropathy Was diagnosed with this and had seen neurology before Does not want to see anybody right now Her symptoms are not bothering her so does not need any treatment like gabapentin  5. Hematuria, with her urinary tract  infection Will repeat her urine in February  6. Vitamin B 12 deficiency  - B12 and Folate Panel   A           Labs/tests ordered:  * No order type specified * Next appt:  03/30/2024     [1]  Allergies Allergen Reactions   Lactose Intolerance (Gi) Other (See Comments)    Gi tract issues   Lactulose Other (See Comments)    Gi tract issues   Lactose     Other Reaction(s): GI   Sulfa Antibiotics Hives    Childhood allergy    "

## 2024-02-19 ENCOUNTER — Encounter: Payer: Self-pay | Admitting: Internal Medicine

## 2024-02-19 ENCOUNTER — Ambulatory Visit: Payer: Self-pay

## 2024-02-19 NOTE — Telephone Encounter (Signed)
 FYI Only or Action Required?: Action required by provider: request for appointment.  Patient was last seen in primary care on 02/10/2024 by Charlanne Fredia CROME, MD.  Called Nurse Triage reporting Anxiety.  Symptoms began several years ago.  Interventions attempted: Prescription medications: Lorazepam  TID.  Symptoms are: unchanged.  Triage Disposition: No disposition on file.  Patient/caregiver understands and will follow disposition?:   Copied from CRM [id number]. Topic: Clinical - Red Word Triage >> Feb 19, 2024 10:00 AM DeAngela L wrote: Red Word that prompted transfer to Nurse Triage: Patient has been feeling anxiety longer than few days, she seen dR Charlanne around thanksgiving and the anxiety is getting worse, they spoke about maybe increasing medication then  Pt num  [phone] Patient lives in the [facility] [address] Community Answer Assessment - Initial Assessment Questions 1. CONCERN: Did anything happen that prompted you to call today?      Not getting better 2. ANXIETY SYMPTOMS: Can you describe how you (your loved one; patient) have been feeling? (e.g., tense, restless, panicky, anxious, keyed up, overwhelmed, sense of impending doom).      Anxious 3. ONSET: How long have you been feeling this way? (e.g., hours, days, weeks)     Saw Dr Charlanne for this at thanksgiving. Has been dealing with for years. 4. SEVERITY: How would you rate the level of anxiety? (e.g., 0 - 10; or mild, moderate, severe).     Not as urgent as before 5. FUNCTIONAL IMPAIRMENT: How have these feelings affected your ability to do daily activities? Have you had more difficulty than usual doing your normal daily activities? (e.g., getting better, same, worse; self-care, school, work, interactions)     Denies 6. HISTORY: Have you felt this way before? Have you ever been diagnosed with an anxiety problem in the past? (e.g., generalized anxiety disorder, panic attacks, PTSD). If Yes, ask: How was  this problem treated? (e.g., medicines, counseling, etc.)     In my nature 7. RISK OF HARM - SUICIDAL IDEATION: Do you ever have thoughts of hurting or killing yourself? If Yes, ask:  Do you have these feelings now? Do you have a plan on how you would do this?     Denies 8. TREATMENT:  What has been done so far to treat this anxiety? (e.g., medicines, relaxation strategies). What has helped?     I thought it was, but says is not a progression. One day is okay, the next day isn't. Takes Lorazepam  TID. 12. OTHER SYMPTOMS: Do you have any other symptoms? (e.g., feeling depressed, trouble concentrating, trouble sleeping, trouble breathing, palpitations or fast heartbeat, chest pain, sweating, nausea, or diarrhea)       Trouble sleeping, trouble concentrating. Nervousness is worse in the morning.  Protocols used: Anxiety and Panic Attack-A-AH

## 2024-02-19 NOTE — Telephone Encounter (Signed)
 Forgot to add to note that I tried to schedule patient, but no appointments found for Dr Charlanne. Requesting appointment. Reason for Disposition  MILD anxiety symptoms (e.g., anxiety symptoms are mild and intermittent; symptoms do not interfere with daily activities)  Protocols used: Anxiety and Panic Attack-A-AH

## 2024-02-19 NOTE — Telephone Encounter (Signed)
 Dr.Gupta is out of office, sending to covering provider

## 2024-02-22 ENCOUNTER — Encounter: Payer: Self-pay | Admitting: Internal Medicine

## 2024-02-22 MED ORDER — ONDANSETRON HCL 4 MG/5ML PO SOLN: 4.0000 mg | Freq: Four times a day (QID) | ORAL | 0 refills | Status: DC | PRN

## 2024-02-22 NOTE — Telephone Encounter (Signed)
 Copied from CRM [id number]. Topic: Clinical - Medication Question >> Feb 22, 2024  2:19 PM Chiquita SQUIBB wrote: Reason for CRM: Patients daughter is calling in requesting an order for Zofran  as the patient has already tried the other recommendations that were sent over by [facility]. Patient is requesting it to not be pills as she can not take them. Please advise Powell

## 2024-02-22 NOTE — Addendum Note (Signed)
 Addended by: SUELLEN DEVIN BROCKS on: 02/22/2024 03:02 PM   Modules accepted: Orders

## 2024-02-22 NOTE — Telephone Encounter (Signed)
 Mast, Man X, NP to Me  (Selected Message)     02/22/24  2:46 PM Try Zofran  solution? Thanks   RX sent as mentioned in previous documentation

## 2024-02-26 ENCOUNTER — Other Ambulatory Visit: Payer: Self-pay

## 2024-02-26 ENCOUNTER — Telehealth: Payer: Self-pay

## 2024-02-26 MED ORDER — ONDANSETRON HCL 4 MG/5ML PO SOLN: 4.0000 mg | Freq: Four times a day (QID) | ORAL | 0 refills | Status: DC | PRN

## 2024-02-26 MED ORDER — PANTOPRAZOLE SODIUM 40 MG PO PACK: 40.0000 mg | PACK | Freq: Every day | ORAL | 3 refills | Status: AC

## 2024-02-26 NOTE — Telephone Encounter (Addendum)
 Mast, Man X, NP to Me (Selected Message)     02/26/24 11:06 AM Great, please refill it for 30 days.  Thanks    Patient daughter has been notified and the Rx refill has been sent to the pharmacy

## 2024-02-26 NOTE — Addendum Note (Signed)
 Addended by: RENELLA KEEN on: 02/26/2024 11:12 AM   Modules accepted: Orders

## 2024-02-26 NOTE — Addendum Note (Signed)
 Addended by: RENELLA KEEN on: 02/26/2024 11:13 AM   Modules accepted: Orders

## 2024-02-26 NOTE — Telephone Encounter (Signed)
 Copied from CRM [id number]. Topic: Clinical - Medication Question >> Feb 22, 2024  2:19 PM Chiquita SQUIBB wrote: Reason for CRM: Patients daughter is calling in requesting an order for Zofran  as the patient has already tried the other recommendations that were sent over by [facility]. Patient is requesting it to not be pills as she can not take them. Please advise Heather >> Feb 26, 2024  8:16 AM Miquel SAILOR wrote: Zofran -Pt is requesting it is working and would like more of the mediation Liquid form only. Needs call back [phone]

## 2024-03-01 ENCOUNTER — Other Ambulatory Visit: Payer: Self-pay | Admitting: Adult Health

## 2024-03-01 DIAGNOSIS — F5101 Primary insomnia: Secondary | ICD-10-CM

## 2024-03-01 DIAGNOSIS — F419 Anxiety disorder, unspecified: Secondary | ICD-10-CM

## 2024-03-05 ENCOUNTER — Other Ambulatory Visit: Payer: Self-pay | Admitting: Adult Health

## 2024-03-05 DIAGNOSIS — F419 Anxiety disorder, unspecified: Secondary | ICD-10-CM

## 2024-03-07 NOTE — Telephone Encounter (Signed)
 Please advise if ok to refill. Last sent by Monina on 02/04/2024 0 refills. 6 days ago it was declined reporting not appropriate to refill.

## 2024-03-09 ENCOUNTER — Encounter: Payer: Self-pay | Admitting: Internal Medicine

## 2024-03-09 ENCOUNTER — Non-Acute Institutional Stay: Admitting: Internal Medicine

## 2024-03-09 VITALS — BP 124/76 | HR 72 | Temp 98.5°F | Resp 18 | Ht 60.0 in | Wt 110.4 lb

## 2024-03-09 DIAGNOSIS — F419 Anxiety disorder, unspecified: Secondary | ICD-10-CM | POA: Diagnosis not present

## 2024-03-09 DIAGNOSIS — K580 Irritable bowel syndrome with diarrhea: Secondary | ICD-10-CM | POA: Diagnosis not present

## 2024-03-09 DIAGNOSIS — F5101 Primary insomnia: Secondary | ICD-10-CM

## 2024-03-09 MED ORDER — TRAZODONE HCL 50 MG PO TABS: 50.0000 mg | ORAL_TABLET | Freq: Every day | ORAL | Status: AC

## 2024-03-09 MED ORDER — SERTRALINE HCL 20 MG/ML PO CONC: 150.0000 mg | Freq: Every day | ORAL | Status: AC

## 2024-03-09 NOTE — Progress Notes (Signed)
 "  Location: [facility] of Service:  Clinic (12)  Provider:   Code Status:  Goals of Care:     01/14/2024    7:08 PM  Advanced Directives  Does Patient Have a Medical Advance Directive? No  Would patient like information on creating a medical advance directive? No - Patient declined     Chief Complaint  Patient presents with   Anxiety    Patient's daughter and patient stated that she has been more nervous more and that patient has been having stomach pains when going back and forth to the bathroom. Patient also stated that she is not sleeping but is on medication for anxiety and sleep.    HPI: Patient is a 89 y.o. female seen today for an acute visit for Follow up  Lives in [facility] [place] Came with her Daughter  Discussed the use of AI scribe software for clinical note transcription with the patient, who gave verbal consent to proceed.  History of Present Illness   Savannah Diaz is an 89 year old female who presents with urinary issues and anxiety management.  Hematuria with UTI Since around Thanksgiving she has had intermittent hematuria, described once as brown urine. A recent urine sample was clear. She has no dysuria, urinary frequency, or other urinary symptoms. She has had prior urinary infections but is currently asymptomatic.  Depression She has anxiety with early morning awakening at 4-5 AM and feels nervous on waking. She has frequent morning bathroom visits with bowel movements that start soft and then become scant. She takes Metamucil due to a low fiber diet and notes that anxiety worsens her gastrointestinal symptoms.  She takes sertraline  150 mg daily and mirtazapine  15 mg nightly. She uses lorazepam  1 mg in the morning and 0.5 mg in the afternoon. She has trazodone  as needed for sleep but is hesitant to use it. Her anxiety and sleep disturbance leave her feeling overwhelmed by her morning tasks.      Has Made Appointment with Psychiatry Service  Other  issues Peripheral Neuropathy She has chronic peripheral neuropathy with tingling and a sensation of altered feeling in her feet. She also feels unsteady, like on a rocking boat, and recently developed worsening balance that led to a fall. She is doing balance exercises and has started using a cane.   PMR off Prednisone   Osteoporosis On Reclast      Past Medical History:  Diagnosis Date   Anxiety    Arthritis    qwhere; but it doesn't hurt (05/12/2012)   Aspiration pneumonia (HCC) 03/21/2016   Cataract    left   Compression fracture of first lumbar vertebra (HCC) 07/2016   superior endplate   Depression    Dysphagia    have had it for 7 yr; got worse 4 days ago (05/12/2012)   Dysphagia, pharyngoesophageal phase - suspected 05/10/2012   GERD (gastroesophageal reflux disease)    Hypertension    Insomnia    Lumbago with sciatica, right side    Migraines    menopause cured them (05/12/2012)   Osteoporosis    Stage 3 chronic kidney disease (HCC)    Uterine prolapse     Past Surgical History:  Procedure Laterality Date   CATARACT EXTRACTION W/ INTRAOCULAR LENS IMPLANT Left ? 2010   ESOPHAGEAL MANOMETRY N/A 11/08/2018   Procedure: ESOPHAGEAL MANOMETRY (EM);  Surgeon: Avram Lupita BRAVO, MD;  Location: [facility];  Service: Endoscopy;  Laterality: N/A;   ESOPHAGOGASTRODUODENOSCOPY N/A 05/10/2012  Procedure: ESOPHAGOGASTRODUODENOSCOPY (EGD);  Surgeon: Gordy CHRISTELLA Starch, MD;  Location: [facility];  Service: Gastroenterology;  Laterality: N/A;   ESOPHAGOGASTRODUODENOSCOPY N/A 04/26/2023   Procedure: ESOPHAGOGASTRODUODENOSCOPY (EGD);  Surgeon: Nandigam, Kavitha V, MD;  Location: [facility] END[facility]COPY;  Service: Gastroenterology;  Laterality: N/A;   FOREIGN BODY REMOVAL  04/26/2023   Procedure: FOREIGN BODY REMOVAL;  Surgeon: Nandigam, Kavitha V, MD;  Location: [facility];  Service: Gastroenterology;;   TONSILLECTOMY  1940's    Allergies[1]  Outpatient Encounter Medications as of 03/09/2024   Medication Sig   amLODipine  (NORVASC ) 10 MG tablet TAKE ONE (1) TABLET BY MOUTH EVERY DAY   atenolol  (TENORMIN ) 50 MG tablet TAKE 1 TABLET BY MOUTH EVERY DAY   Cholecalciferol (VITAMIN D3) 25 MCG (1000 UT) CAPS Take by mouth daily.   cyanocobalamin  (VITAMIN B12) 1000 MCG tablet Take 0.5 tablets (500 mcg total) by mouth daily.   LORazepam  (ATIVAN ) 0.5 MG tablet Take 1 tablet (0.5 mg total) by mouth 3 (three) times daily as needed for anxiety.   mirtazapine  (REMERON ) 15 MG tablet Take 1 tablet (15 mg total) by mouth at bedtime.   pantoprazole  sodium (PROTONIX ) 40 mg Take 40 mg by mouth daily.   Psyllium (METAMUCIL 4 IN 1 FIBER) 25 % PACK 1 packet with 8 ounces of liquid as needed Orally Once a day for 30 day(s)   [DISCONTINUED] sertraline  (ZOLOFT ) 20 MG/ML concentrated solution TAKE 10 MLS (200 MG TOTAL) BY MOUTH DAIL   [DISCONTINUED] traZODone  (DESYREL ) 50 MG tablet Take 1 tablet (50 mg total) by mouth at bedtime as needed for sleep.   sertraline  (ZOLOFT ) 20 MG/ML concentrated solution Take 7.5 mLs (150 mg total) by mouth daily.   traZODone  (DESYREL ) 50 MG tablet Take 1 tablet (50 mg total) by mouth at bedtime.   No facility-administered encounter medications on file as of 03/09/2024.    Review of Systems:  Review of Systems  Constitutional:  Negative for activity change and appetite change.  HENT: Negative.    Respiratory:  Negative for cough and shortness of breath.   Cardiovascular:  Negative for leg swelling.  Gastrointestinal:  Positive for abdominal distention, diarrhea and nausea. Negative for constipation.  Genitourinary: Negative.   Musculoskeletal:  Negative for arthralgias, gait problem and myalgias.  Skin: Negative.   Neurological:  Negative for dizziness and weakness.  Psychiatric/Behavioral:  Positive for dysphoric mood. Negative for confusion and sleep disturbance. The patient is nervous/anxious.     Health Maintenance  Topic Date Due   Medicare Annual Wellness (AWV)   03/08/2024   COVID-19 Vaccine [phone] - 2025-26 season) 09/26/2024 (Originally 12/05/2023)   DTaP/Tdap/Td (3 - Td or Tdap) 01/13/2034   Pneumococcal Vaccine: 50+ Years  Completed   Influenza Vaccine  Completed   Bone Density Scan  Completed   Zoster Vaccines- Shingrix  Completed   Meningococcal B Vaccine  Aged Out    Physical Exam: Vitals:   03/09/24 0849  BP: 124/76  Pulse: 72  Resp: 18  Temp: 98.5 F (36.9 C)  SpO2: 97%  Weight: 110 lb 6.4 oz (50.1 kg)  Height: 5' (1.524 m)   Body mass index is 21.56 kg/m. Physical Exam Vitals reviewed.  Constitutional:      Appearance: Normal appearance.  Neurological:     Mental Status: She is alert.     Labs reviewed: Basic Metabolic Panel: Recent Labs    04/26/23 0948 01/05/24 0930  NA 142 140  K 3.2* 4.1  CL 105 102  CO2 27 25*  GLUCOSE  136*  --   BUN 19 21  CREATININE 0.83 1.0  CALCIUM  9.3  --    Liver Function Tests: Recent Labs    01/05/24 0930  AST 15  ALT 16  ALKPHOS 43   No results for input(s): LIPASE, AMYLASE in the last 8760 hours. No results for input(s): AMMONIA in the last 8760 hours. CBC: Recent Labs    04/26/23 0948  WBC 7.9  NEUTROABS 5.9  HGB 13.3  HCT 39.5  MCV 91.0  PLT 252   Lipid Panel: No results for input(s): CHOL, HDL, LDLCALC, TRIG, CHOLHDL, LDLDIRECT in the last 8760 hours. Lab Results  Component Value Date   HGBA1C 5.5 10/24/2021    Procedures since last visit: No results found.  Assessment/Plan Assessment and Plan    Anxiety disorder with Depression with IBS She reduced the dose of Zoloft  to 150 mg again Also has Appointment with Psych service  Chronic anxiety with exacerbations linked to gastrointestinal symptoms. Current lorazepam  may be insufficient. Increasing lorazepam  not advised due to fall risk and cognitive effects.  - Take two lorazepam  (0.5 mg each) in the morning post-bowel movement. - Continue sertraline  and mirtazapine . - Use  trazodone  at night for sleep and anxiety.  Primary insomnia Chronic insomnia with sleep maintenance issues, possibly worsened by anxiety. Inconsistent trazodone  use may affect sleep.  - Use trazodone  consistently at night.instead PRN  Hematuria with UTI Repeat UA was negative  Irritable Bowl with Diarrhea Ativan  is helping   Labs/tests ordered:  * No order type specified * Next appt:  03/30/2024     [1]  Allergies Allergen Reactions   Lactose Intolerance (Gi) Other (See Comments)    Gi tract issues   Lactulose Other (See Comments)    Gi tract issues   Lactose     Other Reaction(s): GI   Sulfa Antibiotics Hives    Childhood allergy    "

## 2024-03-10 ENCOUNTER — Ambulatory Visit: Payer: Self-pay | Admitting: Internal Medicine

## 2024-03-12 LAB — URINE CULTURE
MICRO NUMBER:: 17473446
SPECIMEN QUALITY:: ADEQUATE

## 2024-03-12 LAB — URINALYSIS
Bilirubin Urine: NEGATIVE
Glucose, UA: NEGATIVE
Hgb urine dipstick: NEGATIVE
Ketones, ur: NEGATIVE
Nitrite: NEGATIVE
Protein, ur: NEGATIVE
Specific Gravity, Urine: 1.007 (ref 1.001–1.035)
pH: 6.5 (ref 5.0–8.0)

## 2024-03-12 LAB — EXTRA URINE SPECIMEN

## 2024-03-22 ENCOUNTER — Other Ambulatory Visit: Payer: Self-pay | Admitting: *Deleted

## 2024-03-22 NOTE — Telephone Encounter (Signed)
 Called pharmacy and spoke with Glade and she stated that the Three times daily rx had no more refills and someone activated the twice daily.   Stacy recommended to send new Rx.  Pended and sent to Dr. Charlanne for approval.

## 2024-03-22 NOTE — Telephone Encounter (Signed)
 Copied from CRM #[id number]. Topic: Clinical - Prescription Issue >> Mar 22, 2024  3:50 PM Cherylann RAMAN wrote: Reason for CRM: Patient states that [facility] has incorrect directions on her LORazepam  (ATIVAN ) 0.5 MG tablet. Patient states they put take 1 0.5 tablet 2x daily, however, the instructions in her chart and our records state to take the medication 3x daily. Patient would like for provider to call and have it corrected as she is going to run out of the medication soon. Patient can be contacted at [phone] for more information.  [id number] [address], [place] - [address] [place] [address] Phone: [phone] Fax: [phone] Hours: Not open 24 hours

## 2024-03-23 MED ORDER — LORAZEPAM 0.5 MG PO TABS: 0.5000 mg | ORAL_TABLET | Freq: Three times a day (TID) | ORAL | 0 refills | Status: AC | PRN

## 2024-03-30 ENCOUNTER — Non-Acute Institutional Stay: Admitting: Internal Medicine

## 2024-03-30 ENCOUNTER — Other Ambulatory Visit: Payer: Self-pay | Admitting: Internal Medicine

## 2024-03-30 ENCOUNTER — Encounter: Payer: Self-pay | Admitting: Internal Medicine

## 2024-03-30 MED ORDER — MELOXICAM 7.5 MG/5ML PO SUSP: 7.5000 mg | Freq: Every day | ORAL | 0 refills | Status: DC | PRN

## 2024-03-30 MED ORDER — DICYCLOMINE HCL 10 MG/5ML PO SOLN: 10.0000 mg | Freq: Every day | ORAL | 12 refills | Status: AC | PRN

## 2024-03-30 NOTE — Patient Instructions (Signed)
 Take Bentyl  /Dicyclomine  as needed for Stomach Spasm  Take Meloxicam  as needed for Back pain as needed Make sure take it with Food.

## 2024-03-30 NOTE — Telephone Encounter (Signed)
 Please resend tablets insurance does not cover liquid meloxicam . Thank you.

## 2024-04-01 ENCOUNTER — Encounter: Payer: Self-pay | Admitting: Internal Medicine

## 2024-06-29 ENCOUNTER — Encounter: Admitting: Internal Medicine
